# Patient Record
Sex: Male | Born: 1935 | Race: White | Hispanic: No | Marital: Married | Smoking: Former smoker
Health system: Southern US, Community
[De-identification: ages and names within clinical notes are randomized; demographics above are authoritative.]

## PROBLEM LIST (undated history)

## (undated) DIAGNOSIS — J42 Unspecified chronic bronchitis: Secondary | ICD-10-CM

## (undated) DIAGNOSIS — J189 Pneumonia, unspecified organism: Secondary | ICD-10-CM

## (undated) DIAGNOSIS — I6529 Occlusion and stenosis of unspecified carotid artery: Secondary | ICD-10-CM

## (undated) DIAGNOSIS — E119 Type 2 diabetes mellitus without complications: Secondary | ICD-10-CM

## (undated) DIAGNOSIS — K2270 Barrett's esophagus without dysplasia: Secondary | ICD-10-CM

## (undated) DIAGNOSIS — C44311 Basal cell carcinoma of skin of nose: Secondary | ICD-10-CM

## (undated) DIAGNOSIS — K5730 Diverticulosis of large intestine without perforation or abscess without bleeding: Secondary | ICD-10-CM

## (undated) DIAGNOSIS — K222 Esophageal obstruction: Secondary | ICD-10-CM

## (undated) DIAGNOSIS — K219 Gastro-esophageal reflux disease without esophagitis: Secondary | ICD-10-CM

## (undated) DIAGNOSIS — I2510 Atherosclerotic heart disease of native coronary artery without angina pectoris: Secondary | ICD-10-CM

## (undated) DIAGNOSIS — Z9289 Personal history of other medical treatment: Secondary | ICD-10-CM

## (undated) DIAGNOSIS — N289 Disorder of kidney and ureter, unspecified: Secondary | ICD-10-CM

## (undated) DIAGNOSIS — M1990 Unspecified osteoarthritis, unspecified site: Secondary | ICD-10-CM

## (undated) DIAGNOSIS — R0600 Dyspnea, unspecified: Secondary | ICD-10-CM

## (undated) DIAGNOSIS — K449 Diaphragmatic hernia without obstruction or gangrene: Secondary | ICD-10-CM

## (undated) DIAGNOSIS — I10 Essential (primary) hypertension: Secondary | ICD-10-CM

## (undated) DIAGNOSIS — Z8711 Personal history of peptic ulcer disease: Secondary | ICD-10-CM

## (undated) DIAGNOSIS — Z85828 Personal history of other malignant neoplasm of skin: Secondary | ICD-10-CM

## (undated) DIAGNOSIS — K760 Fatty (change of) liver, not elsewhere classified: Secondary | ICD-10-CM

## (undated) DIAGNOSIS — J449 Chronic obstructive pulmonary disease, unspecified: Secondary | ICD-10-CM

## (undated) DIAGNOSIS — N200 Calculus of kidney: Secondary | ICD-10-CM

## (undated) DIAGNOSIS — E785 Hyperlipidemia, unspecified: Secondary | ICD-10-CM

## (undated) DIAGNOSIS — R51 Headache: Secondary | ICD-10-CM

## (undated) DIAGNOSIS — R519 Headache, unspecified: Secondary | ICD-10-CM

## (undated) DIAGNOSIS — E7800 Pure hypercholesterolemia, unspecified: Secondary | ICD-10-CM

## (undated) DIAGNOSIS — I639 Cerebral infarction, unspecified: Secondary | ICD-10-CM

## (undated) DIAGNOSIS — G459 Transient cerebral ischemic attack, unspecified: Secondary | ICD-10-CM

## (undated) DIAGNOSIS — H409 Unspecified glaucoma: Secondary | ICD-10-CM

## (undated) DIAGNOSIS — Z87442 Personal history of urinary calculi: Secondary | ICD-10-CM

## (undated) DIAGNOSIS — R413 Other amnesia: Secondary | ICD-10-CM

## (undated) DIAGNOSIS — K5792 Diverticulitis of intestine, part unspecified, without perforation or abscess without bleeding: Secondary | ICD-10-CM

## (undated) DIAGNOSIS — Z8719 Personal history of other diseases of the digestive system: Secondary | ICD-10-CM

## (undated) HISTORY — DX: Diverticulosis of large intestine without perforation or abscess without bleeding: K57.30

## (undated) HISTORY — PX: PARS PLANA VITRECTOMY: SHX2166

## (undated) HISTORY — DX: Personal history of other malignant neoplasm of skin: Z85.828

## (undated) HISTORY — DX: Occlusion and stenosis of unspecified carotid artery: I65.29

## (undated) HISTORY — PX: MOHS SURGERY: SUR867

## (undated) HISTORY — DX: Essential (primary) hypertension: I10

## (undated) HISTORY — PX: CAROTID ENDARTERECTOMY: SUR193

## (undated) HISTORY — DX: Atherosclerotic heart disease of native coronary artery without angina pectoris: I25.10

## (undated) HISTORY — PX: SHOULDER SURGERY: SHX246

## (undated) HISTORY — DX: Fatty (change of) liver, not elsewhere classified: K76.0

## (undated) HISTORY — DX: Gastro-esophageal reflux disease without esophagitis: K21.9

## (undated) HISTORY — DX: Hyperlipidemia, unspecified: E78.5

## (undated) HISTORY — DX: Chronic obstructive pulmonary disease, unspecified: J44.9

## (undated) HISTORY — PX: BASAL CELL CARCINOMA EXCISION: SHX1214

## (undated) HISTORY — PX: KNEE SURGERY: SHX244

## (undated) HISTORY — DX: Diaphragmatic hernia without obstruction or gangrene: K44.9

## (undated) HISTORY — DX: Dyspnea, unspecified: R06.00

## (undated) HISTORY — DX: Unspecified osteoarthritis, unspecified site: M19.90

## (undated) HISTORY — PX: KNEE ARTHROSCOPY: SHX127

## (undated) HISTORY — DX: Transient cerebral ischemic attack, unspecified: G45.9

## (undated) HISTORY — PX: CAROTID ARTERY ANGIOPLASTY: SHX1300

## (undated) HISTORY — PX: EYE SURGERY: SHX253

## (undated) HISTORY — PX: CATARACT EXTRACTION W/ INTRAOCULAR LENS IMPLANT: SHX1309

## (undated) HISTORY — PX: HERNIA REPAIR: SHX51

## (undated) HISTORY — DX: Barrett's esophagus without dysplasia: K22.70

## (undated) HISTORY — DX: Other amnesia: R41.3

## (undated) HISTORY — DX: Personal history of urinary calculi: Z87.442

---

## 1997-04-25 ENCOUNTER — Inpatient Hospital Stay (HOSPITAL_COMMUNITY): Admission: EM | Admit: 1997-04-25 | Discharge: 1997-04-26 | Payer: Self-pay | Admitting: Emergency Medicine

## 1998-04-25 HISTORY — PX: SHOULDER ARTHROSCOPY: SHX128

## 1998-05-08 ENCOUNTER — Ambulatory Visit (HOSPITAL_BASED_OUTPATIENT_CLINIC_OR_DEPARTMENT_OTHER): Admission: RE | Admit: 1998-05-08 | Discharge: 1998-05-08 | Payer: Self-pay | Admitting: Orthopedic Surgery

## 1998-09-01 ENCOUNTER — Encounter (INDEPENDENT_AMBULATORY_CARE_PROVIDER_SITE_OTHER): Payer: Self-pay | Admitting: Specialist

## 1998-09-01 ENCOUNTER — Ambulatory Visit (HOSPITAL_COMMUNITY): Admission: RE | Admit: 1998-09-01 | Discharge: 1998-09-01 | Payer: Self-pay | Admitting: Gastroenterology

## 1999-10-05 ENCOUNTER — Ambulatory Visit (HOSPITAL_COMMUNITY): Admission: RE | Admit: 1999-10-05 | Discharge: 1999-10-05 | Payer: Self-pay | Admitting: Gastroenterology

## 1999-10-19 ENCOUNTER — Encounter: Payer: Self-pay | Admitting: Gastroenterology

## 1999-11-22 ENCOUNTER — Encounter: Payer: Self-pay | Admitting: Surgery

## 1999-11-22 ENCOUNTER — Ambulatory Visit (HOSPITAL_COMMUNITY): Admission: RE | Admit: 1999-11-22 | Discharge: 1999-11-22 | Payer: Self-pay | Admitting: Surgery

## 2001-05-25 ENCOUNTER — Encounter: Admission: RE | Admit: 2001-05-25 | Discharge: 2001-05-25 | Payer: Self-pay | Admitting: Family Medicine

## 2001-05-25 ENCOUNTER — Encounter: Payer: Self-pay | Admitting: Family Medicine

## 2001-10-22 ENCOUNTER — Encounter: Payer: Self-pay | Admitting: Family Medicine

## 2001-10-22 ENCOUNTER — Encounter: Admission: RE | Admit: 2001-10-22 | Discharge: 2001-10-22 | Payer: Self-pay | Admitting: Family Medicine

## 2002-05-05 ENCOUNTER — Encounter: Payer: Self-pay | Admitting: Emergency Medicine

## 2002-05-05 ENCOUNTER — Emergency Department (HOSPITAL_COMMUNITY): Admission: EM | Admit: 2002-05-05 | Discharge: 2002-05-05 | Payer: Self-pay | Admitting: Emergency Medicine

## 2002-12-02 ENCOUNTER — Encounter: Admission: RE | Admit: 2002-12-02 | Discharge: 2002-12-02 | Payer: Self-pay | Admitting: Family Medicine

## 2003-01-25 HISTORY — PX: NASAL POLYP EXCISION: SHX2068

## 2003-02-06 ENCOUNTER — Encounter: Admission: RE | Admit: 2003-02-06 | Discharge: 2003-02-06 | Payer: Self-pay | Admitting: Otolaryngology

## 2003-11-18 ENCOUNTER — Encounter: Admission: RE | Admit: 2003-11-18 | Discharge: 2003-11-18 | Payer: Self-pay | Admitting: Family Medicine

## 2003-11-18 ENCOUNTER — Encounter (INDEPENDENT_AMBULATORY_CARE_PROVIDER_SITE_OTHER): Payer: Self-pay | Admitting: *Deleted

## 2004-08-02 ENCOUNTER — Encounter: Admission: RE | Admit: 2004-08-02 | Discharge: 2004-08-02 | Payer: Self-pay | Admitting: Family Medicine

## 2004-11-06 ENCOUNTER — Encounter: Payer: Self-pay | Admitting: Gastroenterology

## 2004-11-09 ENCOUNTER — Ambulatory Visit: Payer: Self-pay | Admitting: Gastroenterology

## 2004-11-10 ENCOUNTER — Ambulatory Visit: Payer: Self-pay | Admitting: Gastroenterology

## 2004-11-26 ENCOUNTER — Ambulatory Visit: Payer: Self-pay | Admitting: Gastroenterology

## 2004-12-01 ENCOUNTER — Ambulatory Visit: Payer: Self-pay | Admitting: Gastroenterology

## 2004-12-01 ENCOUNTER — Encounter (INDEPENDENT_AMBULATORY_CARE_PROVIDER_SITE_OTHER): Payer: Self-pay | Admitting: Specialist

## 2004-12-07 ENCOUNTER — Ambulatory Visit: Payer: Self-pay | Admitting: Gastroenterology

## 2004-12-09 ENCOUNTER — Encounter: Admission: RE | Admit: 2004-12-09 | Discharge: 2004-12-09 | Payer: Self-pay | Admitting: Gastroenterology

## 2004-12-21 ENCOUNTER — Encounter (INDEPENDENT_AMBULATORY_CARE_PROVIDER_SITE_OTHER): Payer: Self-pay | Admitting: *Deleted

## 2005-01-13 ENCOUNTER — Ambulatory Visit: Payer: Self-pay | Admitting: Gastroenterology

## 2005-01-24 DIAGNOSIS — Z9289 Personal history of other medical treatment: Secondary | ICD-10-CM

## 2005-01-24 HISTORY — DX: Personal history of other medical treatment: Z92.89

## 2005-01-24 HISTORY — PX: ESOPHAGECTOMY: SUR457

## 2005-11-04 ENCOUNTER — Encounter: Admission: RE | Admit: 2005-11-04 | Discharge: 2005-11-04 | Payer: Self-pay | Admitting: Family Medicine

## 2006-01-05 ENCOUNTER — Encounter: Admission: RE | Admit: 2006-01-05 | Discharge: 2006-01-05 | Payer: Self-pay | Admitting: Family Medicine

## 2006-02-24 HISTORY — PX: VENTRAL HERNIA REPAIR: SHX424

## 2006-03-09 ENCOUNTER — Encounter: Admission: RE | Admit: 2006-03-09 | Discharge: 2006-03-09 | Payer: Self-pay | Admitting: Family Medicine

## 2006-08-15 ENCOUNTER — Encounter: Admission: RE | Admit: 2006-08-15 | Discharge: 2006-08-15 | Payer: Self-pay | Admitting: Family Medicine

## 2006-09-13 ENCOUNTER — Ambulatory Visit: Payer: Self-pay | Admitting: Vascular Surgery

## 2006-09-24 ENCOUNTER — Emergency Department (HOSPITAL_COMMUNITY): Admission: EM | Admit: 2006-09-24 | Discharge: 2006-09-24 | Payer: Self-pay | Admitting: Emergency Medicine

## 2007-01-25 HISTORY — PX: EYE SURGERY: SHX253

## 2008-04-17 ENCOUNTER — Encounter: Admission: RE | Admit: 2008-04-17 | Discharge: 2008-04-17 | Payer: Self-pay | Admitting: Internal Medicine

## 2008-05-15 ENCOUNTER — Encounter: Admission: RE | Admit: 2008-05-15 | Discharge: 2008-05-15 | Payer: Self-pay | Admitting: Family Medicine

## 2008-05-20 ENCOUNTER — Ambulatory Visit: Payer: Self-pay | Admitting: Critical Care Medicine

## 2008-05-20 DIAGNOSIS — Z85828 Personal history of other malignant neoplasm of skin: Secondary | ICD-10-CM

## 2008-05-20 DIAGNOSIS — J449 Chronic obstructive pulmonary disease, unspecified: Secondary | ICD-10-CM | POA: Insufficient documentation

## 2008-05-20 DIAGNOSIS — E119 Type 2 diabetes mellitus without complications: Secondary | ICD-10-CM

## 2008-05-20 DIAGNOSIS — M129 Arthropathy, unspecified: Secondary | ICD-10-CM | POA: Insufficient documentation

## 2008-05-20 DIAGNOSIS — I10 Essential (primary) hypertension: Secondary | ICD-10-CM | POA: Insufficient documentation

## 2008-05-20 DIAGNOSIS — H409 Unspecified glaucoma: Secondary | ICD-10-CM

## 2008-05-20 DIAGNOSIS — E785 Hyperlipidemia, unspecified: Secondary | ICD-10-CM

## 2008-05-20 DIAGNOSIS — K2270 Barrett's esophagus without dysplasia: Secondary | ICD-10-CM

## 2008-05-20 DIAGNOSIS — Z8679 Personal history of other diseases of the circulatory system: Secondary | ICD-10-CM | POA: Insufficient documentation

## 2008-05-22 ENCOUNTER — Encounter: Payer: Self-pay | Admitting: Critical Care Medicine

## 2008-06-10 ENCOUNTER — Ambulatory Visit: Payer: Self-pay | Admitting: Critical Care Medicine

## 2008-06-20 ENCOUNTER — Encounter: Payer: Self-pay | Admitting: Critical Care Medicine

## 2008-07-12 ENCOUNTER — Emergency Department (HOSPITAL_COMMUNITY): Admission: EM | Admit: 2008-07-12 | Discharge: 2008-07-12 | Payer: Self-pay | Admitting: Emergency Medicine

## 2008-07-21 ENCOUNTER — Ambulatory Visit: Payer: Self-pay | Admitting: Critical Care Medicine

## 2008-08-27 DIAGNOSIS — K2990 Gastroduodenitis, unspecified, without bleeding: Secondary | ICD-10-CM

## 2008-08-27 DIAGNOSIS — K7689 Other specified diseases of liver: Secondary | ICD-10-CM | POA: Insufficient documentation

## 2008-08-27 DIAGNOSIS — K449 Diaphragmatic hernia without obstruction or gangrene: Secondary | ICD-10-CM | POA: Insufficient documentation

## 2008-08-27 DIAGNOSIS — K5730 Diverticulosis of large intestine without perforation or abscess without bleeding: Secondary | ICD-10-CM | POA: Insufficient documentation

## 2008-08-27 DIAGNOSIS — I6529 Occlusion and stenosis of unspecified carotid artery: Secondary | ICD-10-CM

## 2008-08-27 DIAGNOSIS — K2970 Gastritis, unspecified, without bleeding: Secondary | ICD-10-CM | POA: Insufficient documentation

## 2008-08-27 HISTORY — DX: Occlusion and stenosis of unspecified carotid artery: I65.29

## 2008-08-28 ENCOUNTER — Ambulatory Visit: Payer: Self-pay | Admitting: Gastroenterology

## 2008-08-28 DIAGNOSIS — Z87442 Personal history of urinary calculi: Secondary | ICD-10-CM

## 2008-08-28 DIAGNOSIS — K439 Ventral hernia without obstruction or gangrene: Secondary | ICD-10-CM | POA: Insufficient documentation

## 2008-08-28 DIAGNOSIS — D126 Benign neoplasm of colon, unspecified: Secondary | ICD-10-CM

## 2008-08-28 HISTORY — DX: Personal history of urinary calculi: Z87.442

## 2008-09-05 ENCOUNTER — Ambulatory Visit: Payer: Self-pay | Admitting: Gastroenterology

## 2008-09-12 ENCOUNTER — Ambulatory Visit: Payer: Self-pay | Admitting: Critical Care Medicine

## 2008-11-24 HISTORY — PX: GLAUCOMA SURGERY: SHX656

## 2008-12-10 ENCOUNTER — Ambulatory Visit: Payer: Self-pay | Admitting: Critical Care Medicine

## 2009-01-09 ENCOUNTER — Ambulatory Visit: Payer: Self-pay | Admitting: Internal Medicine

## 2009-01-09 ENCOUNTER — Inpatient Hospital Stay (HOSPITAL_COMMUNITY): Admission: EM | Admit: 2009-01-09 | Discharge: 2009-01-12 | Payer: Self-pay | Admitting: Emergency Medicine

## 2009-01-15 ENCOUNTER — Encounter: Payer: Self-pay | Admitting: Adult Health

## 2009-01-15 ENCOUNTER — Ambulatory Visit: Payer: Self-pay | Admitting: Critical Care Medicine

## 2009-01-15 DIAGNOSIS — T782XXA Anaphylactic shock, unspecified, initial encounter: Secondary | ICD-10-CM | POA: Insufficient documentation

## 2009-02-20 ENCOUNTER — Ambulatory Visit: Payer: Self-pay | Admitting: Critical Care Medicine

## 2009-05-01 ENCOUNTER — Encounter: Admission: RE | Admit: 2009-05-01 | Discharge: 2009-05-01 | Payer: Self-pay | Admitting: Internal Medicine

## 2009-05-29 ENCOUNTER — Ambulatory Visit: Payer: Self-pay | Admitting: Critical Care Medicine

## 2009-05-29 ENCOUNTER — Telehealth: Payer: Self-pay | Admitting: Critical Care Medicine

## 2009-06-01 ENCOUNTER — Inpatient Hospital Stay (HOSPITAL_COMMUNITY): Admission: EM | Admit: 2009-06-01 | Discharge: 2009-06-02 | Payer: Self-pay | Admitting: Emergency Medicine

## 2009-07-30 ENCOUNTER — Ambulatory Visit: Payer: Self-pay | Admitting: Critical Care Medicine

## 2009-08-19 ENCOUNTER — Ambulatory Visit: Payer: Self-pay | Admitting: Critical Care Medicine

## 2009-08-19 ENCOUNTER — Telehealth (INDEPENDENT_AMBULATORY_CARE_PROVIDER_SITE_OTHER): Payer: Self-pay | Admitting: *Deleted

## 2009-08-25 ENCOUNTER — Telehealth (INDEPENDENT_AMBULATORY_CARE_PROVIDER_SITE_OTHER): Payer: Self-pay | Admitting: *Deleted

## 2009-09-16 ENCOUNTER — Ambulatory Visit: Payer: Self-pay | Admitting: Critical Care Medicine

## 2009-10-28 ENCOUNTER — Ambulatory Visit: Payer: Self-pay | Admitting: Critical Care Medicine

## 2009-11-11 ENCOUNTER — Telehealth (INDEPENDENT_AMBULATORY_CARE_PROVIDER_SITE_OTHER): Payer: Self-pay | Admitting: *Deleted

## 2009-11-12 ENCOUNTER — Telehealth (INDEPENDENT_AMBULATORY_CARE_PROVIDER_SITE_OTHER): Payer: Self-pay | Admitting: *Deleted

## 2009-11-20 ENCOUNTER — Encounter: Payer: Self-pay | Admitting: Critical Care Medicine

## 2009-11-20 ENCOUNTER — Telehealth (INDEPENDENT_AMBULATORY_CARE_PROVIDER_SITE_OTHER): Payer: Self-pay | Admitting: *Deleted

## 2009-11-25 ENCOUNTER — Ambulatory Visit: Payer: Self-pay | Admitting: Critical Care Medicine

## 2009-11-25 ENCOUNTER — Telehealth (INDEPENDENT_AMBULATORY_CARE_PROVIDER_SITE_OTHER): Payer: Self-pay | Admitting: *Deleted

## 2009-11-26 ENCOUNTER — Telehealth (INDEPENDENT_AMBULATORY_CARE_PROVIDER_SITE_OTHER): Payer: Self-pay | Admitting: *Deleted

## 2010-01-27 ENCOUNTER — Ambulatory Visit
Admission: RE | Admit: 2010-01-27 | Discharge: 2010-01-27 | Payer: Self-pay | Source: Home / Self Care | Attending: Critical Care Medicine | Admitting: Critical Care Medicine

## 2010-02-23 NOTE — Assessment & Plan Note (Signed)
Summary: Pulmonary OV   Copy to:  n/a Primary Provider/Referring Provider:  Henri Medal  CC:  4 wk follow up.  states breathing is doing well overall.  No complaints. .  History of Present Illness:   77 year old man with COPD primary emphysematous component..   Prior esophagectomy 2007 and abn CXR since that surgery.    July 21, 2008-- last OV we rec:, Increase foradil to one puff/capsule twice daily. Increase Qvar to two puff twice daily, No change on spiriva     September 12, 2008-- at last ov we rec:Increase Qvar to 3 puffs twice daily Start Nasonex two puff each nostril daily Prednisone 10mg  4 each am x3days, 3 x 3days, 2 x 3days, 1 x 3days then stop Now the cough is productive of clear phlegm.  December 10, 2008--Pt is now off the qvar.The pt stopped this on his own x one month There has been no increase in cough.The dyspnea is better The pt is only on spiriva and foradil and is not using the proair  January 15, 2009 --Presents for post hospital visit States breathing is doing well but still having prod cough with clear mucus. Admitted 12/17-12/20/10 for Acute Resp Failure secondary to anaphylactic reaction to Avelox. Xray showed left effusion, and atx. Says within 1 hour he has severe shortness of breath, collapsed at home, EMS transferred to hospital required intubation and vent support. He quickly improved and was extubated . Discharged on zithromax. Tx w/ IV steroids. Also stopped on ARB in hosptial (due to potential for severe reactions) Denies chest pain, dyspnea, orthopnea, hemoptysis, fever, n/v/d, edema, headache.   February 20, 2009 10:13 AM No cough,  Shortness of breath is stable.  No real chest pain.  Sl wheeze.  No edema in feet. Pt denies any significant sore throat, nasal congestion or excess secretions, fever, chills, sweats, unintended weight loss, pleurtic or exertional chest pain, orthopnea PND, or leg swelling Pt denies any increase in rescue therapy over  baseline, denies waking up needing it or having any early am or nocturnal exacerbations of coughing/wheezing/or dyspnea.   Preventive Screening-Counseling & Management  Alcohol-Tobacco     Smoking Status: quit > 6 months  Current Medications (verified): 1)  Spiriva Handihaler 18 Mcg Caps (Tiotropium Bromide Monohydrate) .... Inhale Contents of 1 Capsule Once A Day 2)  Foradil Aerolizer 12 Mcg Caps (Formoterol Fumarate) .... Inhale 1 Puff Twice Daily 3)  Proair Hfa 108 (90 Base) Mcg/act  Aers (Albuterol Sulfate) .Marland Kitchen.. 1-2 Puffs Every 4-6 Hours As Needed 4)  Simvastatin 80 Mg Tabs (Simvastatin) .... Take 1/2 Tab By Mouth At Bedtime 5)  Omeprazole 20 Mg Tbec (Omeprazole) .... Take 2 Tabs By Mouth Daily 6)  Metformin Hcl 500 Mg Tabs (Metformin Hcl) .... Take 2 Tabs By Mouth Each Morning and 2 Tab By Mouth Each Evening 7)  Valtrex 500 Mg Tabs (Valacyclovir Hcl) .... Take 2 Tabs By Mouth Every 8 Hours As Needed 8)  Aspirin Low Dose 81 Mg Tabs (Aspirin) .... Take 1 Tablet By Mouth Once A Day 9)  Diazepam 5 Mg Tabs (Diazepam) .... 1/2 By Mouth Once Daily As Needed 10)  Promethazine Hcl 25 Mg Tabs (Promethazine Hcl) .... 1/2 To 1 By Mouth As Needed 11)  Clonidine Hcl 0.2 Mg Tabs (Clonidine Hcl) .Marland Kitchen.. 1 Tab By Mouth As Needed When Bp Is Over 140 12)  Prednisone 10 Mg Tabs (Prednisone) .... Taper As Directed Per Hospital Discharge Sheet 13)  Qvar 80 Mcg/act Aers (Beclomethasone  Dipropionate) .... 2 Puffs Once Daily 14)  Losartan Potassium-Hctz 100-12.5 Mg Tabs (Losartan Potassium-Hctz) .... Once Daily 15)  Combigan 0.2-0.5 % Soln (Brimonidine Tartrate-Timolol) .... To Left Eye Two Times A Day  Allergies (verified): 1)  ! * Avelox 2)  ! * "quinolone Family" No Avelox, Levaquin, Cipro , Factive 3)  Penicillin 4)  Sulfa  Past Pulmonary History:  Pulmonary History: Pulmonary Function Test  Date: 06/10/2008 Height (in.): 70 Gender: Male  Pre-Spirometry  FVC     Value: 3.12 L/min   Pred: 4.37  L/min     % Pred: 71 % FEV1     Value: 1.78 L     Pred: 2.84 L     % Pred: 61 % FEV1/FVC   Value: 55 %     Pred: 67 %     FEF 25-75   Value: 0.51 L/min   Pred: 22.43 L/min     % Pred: 21 %  Post-Spirometry  FVC     Value: 3.06 L/min   Pred: 4.37 L/min     % Pred: 70 % FEV1     Value: 1.73 L     Pred: 2.84 L     % Pred: 61 % FEV1/FVC   Value: 56 %     Pred: 67 %     FEF 25-75   Value: 0.49 L/min   Pred: 2.43 L/min     % Pred: 20 %  Lung Volumes  TLC     Value: 4.73 L   % Pred: 71 % RV     Value: 1.61 L   % Pred: 59 % DLCO     Value: 17.9 %   % Pred: 87 % DLCO/VA   Value: 3.68 %   % Pred: 115 %  Comments:  Severe obstructive defect,  normal DLCO,  mild restriction  6 min walk test No desaturation Interpretation:  Number of laps  8 X 48 meters =   384 meters+ final partial lap: 40 meters =    424 meters   Total distance walked in six minutes: 424 meters  Tech ID: Tivis Ringer (Jun 10, 2008 12:01 PM) Tech Comments pt completed test w/ 0 rest  breaks and 0 complaints  Review of Systems       The patient complains of shortness of breath with activity.  The patient denies shortness of breath at rest, productive cough, non-productive cough, coughing up blood, chest pain, irregular heartbeats, acid heartburn, indigestion, loss of appetite, weight change, abdominal pain, difficulty swallowing, sore throat, tooth/dental problems, headaches, nasal congestion/difficulty breathing through nose, sneezing, itching, ear ache, anxiety, depression, hand/feet swelling, joint stiffness or pain, rash, change in color of mucus, and fever.    Vital Signs:  Patient profile:   77 year old male Height:      71 inches Weight:      186 pounds BMI:     26.04 O2 Sat:      96 % on Room air Temp:     97.7 degrees F oral Pulse rate:   101 / minute BP sitting:   130 / 68  (left arm) Cuff size:   regular  Vitals Entered By: Gweneth Dimitri RN (February 20, 2009 10:04 AM)  O2 Flow:  Room air CC: 4 wk follow  up.  states breathing is doing well overall.  No complaints.  Comments Medications reviewed with patient Daytime contact number verified with patient. Gweneth Dimitri RN  February 20, 2009 10:04 AM  Physical Exam  Additional Exam:  Gen: Pleasant, well-nourished, in no distress,  normal affect ENT: No lesions,  mouth clear,  oropharynx clear, no postnasal drip Neck: No JVD, no TMG, no carotid bruits Lungs: No use of accessory muscles, no dullness to percussion, distant BS Cardiovascular: RRR, heart sounds normal, no murmur or gallops, no peripheral edema Abdomen: soft and NT, no HSM,  BS normal Musculoskeletal: No deformities, no cyanosis or clubbing Neuro: alert, non focal Skin: Warm, no lesions or rashes    Impression & Recommendations:  Problem # 1:  C O P D (ICD-496) Assessment Improved COPD stable plan No change in inhaled medications.   Maintain treatment program as currently prescribed.  Medications Added to Medication List This Visit: 1)  Omeprazole 20 Mg Tbec (Omeprazole) .... Take 1  tab  by mouth daily 2)  Diazepam 5 Mg Tabs (Diazepam) .... 1/2 by mouth once daily as needed 3)  Qvar 80 Mcg/act Aers (Beclomethasone dipropionate) .... 2 puffs once daily 4)  Losartan Potassium-hctz 100-12.5 Mg Tabs (Losartan potassium-hctz) .... Once daily 5)  Combigan 0.2-0.5 % Soln (Brimonidine tartrate-timolol) .... To left eye two times a day  Complete Medication List: 1)  Spiriva Handihaler 18 Mcg Caps (Tiotropium bromide monohydrate) .... Inhale contents of 1 capsule once a day 2)  Foradil Aerolizer 12 Mcg Caps (Formoterol fumarate) .... Inhale 1 puff twice daily 3)  Proair Hfa 108 (90 Base) Mcg/act Aers (Albuterol sulfate) .Marland Kitchen.. 1-2 puffs every 4-6 hours as needed 4)  Simvastatin 80 Mg Tabs (Simvastatin) .... Take 1/2 tab by mouth at bedtime 5)  Omeprazole 20 Mg Tbec (Omeprazole) .... Take 1  tab  by mouth daily 6)  Metformin Hcl 500 Mg Tabs (Metformin hcl) .... Take 2 tabs by  mouth each morning and 2 tab by mouth each evening 7)  Aspirin Low Dose 81 Mg Tabs (Aspirin) .... Take 1 tablet by mouth once a day 8)  Diazepam 5 Mg Tabs (Diazepam) .... 1/2 by mouth once daily as needed 9)  Promethazine Hcl 25 Mg Tabs (Promethazine hcl) .... 1/2 to 1 by mouth as needed 10)  Clonidine Hcl 0.2 Mg Tabs (Clonidine hcl) .Marland Kitchen.. 1 tab by mouth as needed when bp is over 140 11)  Qvar 80 Mcg/act Aers (Beclomethasone dipropionate) .... 2 puffs once daily 12)  Losartan Potassium-hctz 100-12.5 Mg Tabs (Losartan potassium-hctz) .... Once daily 13)  Combigan 0.2-0.5 % Soln (Brimonidine tartrate-timolol) .... To left eye two times a day  Other Orders: Est. Patient Level III (16109)  Patient Instructions: 1)  No change in medicaitons 2)  Return 4 months    Appended Document: Pulmonary OV fax ron roberts

## 2010-02-23 NOTE — Assessment & Plan Note (Signed)
Summary: Pulmonary OV   Copy to:  n/a Primary Provider/Referring Provider:  Henri Medal  CC:  Acute Visit.  States after finishing prednisone and abx, SOB, chest congestion, and cough returned.  Cough is prod with a small amount of clear mucus.  .  History of Present Illness:   77 year old man with COPD primary emphysematous component..   Prior esophagectomy 2007 and abn CXR since that surgery.    February 20, 2009 10:13 AM No cough,  Shortness of breath is stable.  No real chest pain.  Sl wheeze.  No edema in feet. Pt denies any significant sore throat, nasal congestion or excess secretions, fever, chills, sweats, unintended weight loss, pleurtic or exertional chest pain, orthopnea PND, or leg swelling Pt denies any increase in rescue therapy over baseline, denies waking up needing it or having any early am or nocturnal exacerbations of coughing/wheezing/or dyspnea.  May 29, 2009 1:41 PM More dyspnea and wheezing is noted and gotten worse over the past few months.  More cough and productive clear white mucous.  No real chest pain.  Notes more wheezing.  No real edema.    July 30, 2009--Presents for an acute office visit. Complains of prod cough with green mucus, wheezing, increased SOB x2weeks - He is  currently on 3rd day of 3tabs of 10mg  pred taper. Had left over steroid taper that he did not take in past. He is still coughing w/ thick green mucus. Wheezing is some better. Denies chest pain,  orthopnea, hemoptysis, fever, n/v/d, edema, headache.   August 19, 2009 2:05 PM Pt seen for exac and rx doxy and pred per NP on 07/30/09. The pt finished all meds and now is no better.  The  mucus now,however,  is clearer.  The pt is still dyspneic and worse with heat.  No real chest pain.  Notes some wheeze.  No hemoptysis, n/v, f/c/s.  Preventive Screening-Counseling & Management  Alcohol-Tobacco     Smoking Status: quit > 6 months     Year Quit: 1990     Pack years: 40 yrs x1 ppd  Current  Medications (verified): 1)  Spiriva Handihaler 18 Mcg Caps (Tiotropium Bromide Monohydrate) .... Inhale Contents of 1 Capsule Once A Day 2)  Proair Hfa 108 (90 Base) Mcg/act  Aers (Albuterol Sulfate) .Marland Kitchen.. 1-2 Puffs Every 4-6 Hours As Needed 3)  Simvastatin 80 Mg Tabs (Simvastatin) .... Take 1/2 Tab By Mouth At Bedtime 4)  Omeprazole 20 Mg Tbec (Omeprazole) .... Take 1  Tab  By Mouth Daily 5)  Metformin Hcl 500 Mg Tabs (Metformin Hcl) .... Take 2 Tabs By Mouth Each Morning and 2 Tab By Mouth Each Evening 6)  Aspirin Low Dose 81 Mg Tabs (Aspirin) .... Take 1 Tablet By Mouth Once A Day 7)  Diazepam 5 Mg Tabs (Diazepam) .... 1/2 By Mouth Once Daily As Needed 8)  Promethazine Hcl 25 Mg Tabs (Promethazine Hcl) .... 1/2 To 1 By Mouth As Needed 9)  Clonidine Hcl 0.2 Mg Tabs (Clonidine Hcl) .Marland Kitchen.. 1 Tab By Mouth As Needed When Bp Is Over 140 10)  Qvar 80 Mcg/act Aers (Beclomethasone Dipropionate) .... 2 Puffs Twice Daily 11)  Losartan Potassium-Hctz 100-12.5 Mg Tabs (Losartan Potassium-Hctz) .... Once Daily 12)  Alphagan P 0.1 % Soln (Brimonidine Tartrate) .Marland Kitchen.. 1 Gtt Left Eye Two Times A Day 13)  Valacyclovir Hcl 500 Mg Tabs (Valacyclovir Hcl) .... As Needed 14)  Foradil Aerolizer 12 Mcg Caps (Formoterol Fumarate) .Marland Kitchen.. 1 Puff  Two Times A Day 15)  Amlodipine Besylate 5 Mg Tabs (Amlodipine Besylate) .... Take 1 Tablet By Mouth Once A Day 16)  Vitamin D 1000 Unit Tabs (Cholecalciferol) .... Take 1 Tablet By Mouth Once A Day 17)  Multivitamins   Tabs (Multiple Vitamin) .... Take 1 Tablet By Mouth Once A Day 18)  Saw Palmetto 500 Mg Caps (Saw Palmetto (Serenoa Repens)) .... Take 1 Capsule By Mouth Once A Day 19)  Zinc 100 Mg Tabs (Zinc) .... Take 1 Tablet By Mouth Once A Day 20)  Vitamin E 400 Unit Caps (Vitamin E) .... Take 1 Capsule By Mouth Once A Day 21)  Glimepiride 2 Mg Tabs (Glimepiride) .... Take 1 Tablet By Mouth Every Morning  Allergies (verified): 1)  ! * Avelox 2)  ! * "quinolone Family" No  Avelox, Levaquin, Cipro , Factive 3)  Penicillin 4)  Sulfa  Past History:  Past medical, surgical, family and social histories (including risk factors) reviewed, and no changes noted (except as noted below).  Past Medical History: Reviewed history from 08/27/2008 and no changes required. Current Problems:  FATTY LIVER DISEASE (ICD-571.8) CAROTID ARTERY STENOSIS, BILATERAL (ICD-433.10) DIVERTICULOSIS, COLON (ICD-562.10) GASTRITIS (ICD-535.50) HIATAL HERNIA WITH REFLUX (ICD-553.3) DYSPNEA (ICD-786.05) C O P D (ICD-496) BASAL CELL CARCINOMA, HX OF (ICD-V10.83) BARRETTS ESOPHAGUS (ICD-530.85) ARTHRITIS (ICD-716.90) TRANSIENT ISCHEMIC ATTACKS, HX OF (ICD-V12.50) DM (ICD-250.00) HYPERLIPIDEMIA (ICD-272.4) ACID REFLUX DISEASE (ICD-530.81) GLAUCOMA (ICD-365.9) HYPERTENSION (ICD-401.9)  Past Surgical History: Reviewed history from 08/28/2008 and no changes required. Esophagectomy with stomach pull through 2007    -barretts esophagitis  pre cancer Eye surgeries ventral hernia repair 2008    -due to prior abdominal incision ortho knee both kness carotid arteries  Past Pulmonary History:  Pulmonary History: Pulmonary Function Test  Date: 06/10/2008 Height (in.): 70 Gender: Male  Pre-Spirometry  FVC     Value: 3.12 L/min   Pred: 4.37 L/min     % Pred: 71 % FEV1     Value: 1.78 L     Pred: 2.84 L     % Pred: 61 % FEV1/FVC   Value: 55 %     Pred: 67 %     FEF 25-75   Value: 0.51 L/min   Pred: 22.43 L/min     % Pred: 21 %  Post-Spirometry  FVC     Value: 3.06 L/min   Pred: 4.37 L/min     % Pred: 70 % FEV1     Value: 1.73 L     Pred: 2.84 L     % Pred: 61 % FEV1/FVC   Value: 56 %     Pred: 67 %     FEF 25-75   Value: 0.49 L/min   Pred: 2.43 L/min     % Pred: 20 %  Lung Volumes  TLC     Value: 4.73 L   % Pred: 71 % RV     Value: 1.61 L   % Pred: 59 % DLCO     Value: 17.9 %   % Pred: 87 % DLCO/VA   Value: 3.68 %   % Pred: 115 %  Comments:  Severe obstructive defect,   normal DLCO,  mild restriction  6 min walk test No desaturation Interpretation:  Number of laps  8 X 48 meters =   384 meters+ final partial lap: 40 meters =    424 meters   Total distance walked in six minutes: 424 meters  Tech ID: Tivis Ringer (Jun 10, 2008 12:01  PM) Tech Comments pt completed test w/ 0 rest  breaks and 0 complaints  Family History: Reviewed history from 05/20/2008 and no changes required. Family History Colon Cancer mother, MGF Family History Lung Cancer  mother  Family History MI/Heart Attack mother , 2 brothers Esophageal/stomach cancer :  father   Social History: Reviewed history from 08/28/2008 and no changes required. Patient states former smoker.  retired Armed forces operational officer Occupation: retired Alcohol Use - no Illicit Drug Use - no  Review of Systems       The patient complains of shortness of breath with activity, shortness of breath at rest, and productive cough.  The patient denies non-productive cough, coughing up blood, chest pain, irregular heartbeats, acid heartburn, indigestion, loss of appetite, weight change, abdominal pain, difficulty swallowing, sore throat, tooth/dental problems, headaches, nasal congestion/difficulty breathing through nose, sneezing, itching, ear ache, anxiety, depression, hand/feet swelling, joint stiffness or pain, rash, change in color of mucus, and fever.    Vital Signs:  Patient profile:   77 year old male Height:      69 inches Weight:      177.38 pounds BMI:     26.29 O2 Sat:      97 % on Room air Temp:     98.3 degrees F oral Pulse rate:   84 / minute BP sitting:   136 / 80  (left arm) Cuff size:   regular  Vitals Entered By: Gweneth Dimitri RN (August 19, 2009 1:39 PM)  O2 Flow:  Room air CC: Acute Visit.  States after finishing prednisone and abx, SOB, chest congestion, cough returned.  Cough is prod with a small amount of clear mucus.   Comments Medications reviewed with patient Daytime contact number  verified with patient. Gweneth Dimitri RN  August 19, 2009 1:39 PM    Physical Exam  Additional Exam:  Gen: Pleasant, well-nourished, in no distress,  normal affect ENT: No lesions,  mouth clear,  oropharynx clear, no postnasal drip Neck: No JVD, no TMG, no carotid bruits Lungs: No use of accessory muscles, no dullness to percussion, distant BS, exp wheeze Cardiovascular: RRR, heart sounds normal, no murmur or gallops, no peripheral edema Abdomen: soft and NT, no HSM,  BS normal Musculoskeletal: No deformities, no cyanosis or clubbing Neuro: alert, non focal Skin: Warm, no lesions or rashes    Impression & Recommendations:  Problem # 1:  C O P D (ICD-496) Assessment Deteriorated copd with AB flare and ongoing airway inflammation plan trial Daliresp daily, samples given repulse prednisone no further ABX  Medications Added to Medication List This Visit: 1)  Daliresp  .... 500 micrograms tablet one by mouth daily 2)  Prednisone 10 Mg Tabs (Prednisone) .... Take as directed 4 each am x3days, 3 x 3days, 2 x 3days, 1 x 3days then stop  Complete Medication List: 1)  Spiriva Handihaler 18 Mcg Caps (Tiotropium bromide monohydrate) .... Inhale contents of 1 capsule once a day 2)  Proair Hfa 108 (90 Base) Mcg/act Aers (Albuterol sulfate) .Marland Kitchen.. 1-2 puffs every 4-6 hours as needed 3)  Simvastatin 80 Mg Tabs (Simvastatin) .... Take 1/2 tab by mouth at bedtime 4)  Omeprazole 20 Mg Tbec (Omeprazole) .... Take 1  tab  by mouth daily 5)  Metformin Hcl 500 Mg Tabs (Metformin hcl) .... Take 2 tabs by mouth each morning and 2 tab by mouth each evening 6)  Aspirin Low Dose 81 Mg Tabs (Aspirin) .... Take 1 tablet by mouth once a day 7)  Diazepam 5 Mg Tabs (Diazepam) .... 1/2 by mouth once daily as needed 8)  Promethazine Hcl 25 Mg Tabs (Promethazine hcl) .... 1/2 to 1 by mouth as needed 9)  Clonidine Hcl 0.2 Mg Tabs (Clonidine hcl) .Marland Kitchen.. 1 tab by mouth as needed when bp is over 140 10)  Qvar 80  Mcg/act Aers (Beclomethasone dipropionate) .... 2 puffs twice daily 11)  Losartan Potassium-hctz 100-12.5 Mg Tabs (Losartan potassium-hctz) .... Once daily 12)  Alphagan P 0.1 % Soln (Brimonidine tartrate) .Marland Kitchen.. 1 gtt left eye two times a day 13)  Valacyclovir Hcl 500 Mg Tabs (Valacyclovir hcl) .... As needed 14)  Foradil Aerolizer 12 Mcg Caps (Formoterol fumarate) .Marland Kitchen.. 1 puff two times a day 15)  Amlodipine Besylate 5 Mg Tabs (Amlodipine besylate) .... Take 1 tablet by mouth once a day 16)  Vitamin D 1000 Unit Tabs (Cholecalciferol) .... Take 1 tablet by mouth once a day 17)  Multivitamins Tabs (Multiple vitamin) .... Take 1 tablet by mouth once a day 18)  Saw Palmetto 500 Mg Caps (Saw palmetto (serenoa repens)) .... Take 1 capsule by mouth once a day 19)  Zinc 100 Mg Tabs (Zinc) .... Take 1 tablet by mouth once a day 20)  Vitamin E 400 Unit Caps (Vitamin e) .... Take 1 capsule by mouth once a day 21)  Glimepiride 2 Mg Tabs (Glimepiride) .... Take 1 tablet by mouth every morning 22)  Daliresp  .... 500 micrograms tablet one by mouth daily 23)  Prednisone 10 Mg Tabs (Prednisone) .... Take as directed 4 each am x3days, 3 x 3days, 2 x 3days, 1 x 3days then stop 24)  Biaxin 500 Mg Tabs (Clarithromycin) .... Take 1 tablet by mouth two times a day  Other Orders: Est. Patient Level IV (16109)  Patient Instructions: 1)  Start Daliresp one daily (use samples first) 2)  Prednisone 10mg  4 each am x3days, 3 x 3days, 2 x 3days, 1 x 3days then stop 3)  No other medication changes 4)  Return one month Prescriptions: PREDNISONE 10 MG  TABS (PREDNISONE) Take as directed 4 each am x3days, 3 x 3days, 2 x 3days, 1 x 3days then stop  #30 x 0   Entered and Authorized by:   Storm Frisk MD   Signed by:   Storm Frisk MD on 08/19/2009   Method used:   Electronically to        [facility] [address] [phone]* (retail)       [address]       [place], [place]  [address]        Ph: [phone] or [phone]       Fax: [phone]   RxID:   [id number] DALIRESP 500 micrograms tablet One by mouth daily  #30 x 6   Entered and Authorized by:   Storm Frisk MD   Signed by:   Storm Frisk MD on 08/19/2009   Method used:   Print then Give to Patient   RxID:   [id number]   Appended Document: Pulmonary OV fax Zenaida Deed

## 2010-02-23 NOTE — Assessment & Plan Note (Signed)
Summary: Pulmonary OV   Copy to:  n/a Primary Provider/Referring Provider:  Henri Medal  CC:  4 month follow up.  Pt states he is not having any problems with breathing but does state he has wheezing "all the time."  States he does have a prod cough with clear phelgm and chest congestion.  Would like to discuss getting a rx for prednsione.Marland Kitchen  History of Present Illness:   77 year old man with COPD primary emphysematous component..   Prior esophagectomy 2007 and abn CXR since that surgery.    February 20, 2009 10:13 AM No cough,  Shortness of breath is stable.  No real chest pain.  Sl wheeze.  No edema in feet. Pt denies any significant sore throat, nasal congestion or excess secretions, fever, chills, sweats, unintended weight loss, pleurtic or exertional chest pain, orthopnea PND, or leg swelling Pt denies any increase in rescue therapy over baseline, denies waking up needing it or having any early am or nocturnal exacerbations of coughing/wheezing/or dyspnea.  May 29, 2009 1:41 PM More dyspnea and wheezing is noted and gotten worse over the past few months.  More cough and productive clear white mucous.  No real chest pain.  Notes more wheezing.  No real edema.    Clinical Reports Reviewed:  PFT's:  06/20/2008: DLCO %Predicted:  87 FEF 25/75 %Predicted:  21 FEV1 %Predicted:  61 FVC %Predicted:  71 Post Spirometry FEF 25/75 %Predicted:  20 Post Spirometry FEV1 %Predicted:  61 Post Spirometry FVC %Predicted:  70 RV %Predicted:  59 TLC %Predicted:  71  05/22/2008: FEF 25/75 %Predicted:  16 FEV1 %Predicted:  40 FEV1/FVC %Predicted:  73 FVC %Predicted:  55  05/20/2008: FEF 25/75 %Predicted:  16 FEV1 %Predicted:  40 FVC %Predicted:  55   Preventive Screening-Counseling & Management  Alcohol-Tobacco     Smoking Status: quit > 6 months  Current Medications (verified): 1)  Spiriva Handihaler 18 Mcg Caps (Tiotropium Bromide Monohydrate) .... Inhale Contents of 1 Capsule  Once A Day 2)  Proair Hfa 108 (90 Base) Mcg/act  Aers (Albuterol Sulfate) .Marland Kitchen.. 1-2 Puffs Every 4-6 Hours As Needed 3)  Simvastatin 80 Mg Tabs (Simvastatin) .... Take 1/2 Tab By Mouth At Bedtime 4)  Omeprazole 20 Mg Tbec (Omeprazole) .... Take 1  Tab  By Mouth Daily 5)  Metformin Hcl 500 Mg Tabs (Metformin Hcl) .... Take 2 Tabs By Mouth Each Morning and 2 Tab By Mouth Each Evening 6)  Aspirin Low Dose 81 Mg Tabs (Aspirin) .... Take 1 Tablet By Mouth Once A Day 7)  Diazepam 5 Mg Tabs (Diazepam) .... 1/2 By Mouth Once Daily As Needed 8)  Promethazine Hcl 25 Mg Tabs (Promethazine Hcl) .... 1/2 To 1 By Mouth As Needed 9)  Clonidine Hcl 0.2 Mg Tabs (Clonidine Hcl) .Marland Kitchen.. 1 Tab By Mouth As Needed When Bp Is Over 140 10)  Qvar 80 Mcg/act Aers (Beclomethasone Dipropionate) .... 2 Puffs Once Daily 11)  Losartan Potassium-Hctz 100-12.5 Mg Tabs (Losartan Potassium-Hctz) .... Once Daily 12)  Alphagan P 0.1 % Soln (Brimonidine Tartrate) .Marland Kitchen.. 1 Gtt Left Eye Two Times A Day 13)  Valacyclovir Hcl 500 Mg Tabs (Valacyclovir Hcl) .... As Needed 14)  Serevent Diskus 50 Mcg/dose Aepb (Salmeterol Xinafoate) .... Two Times A Day  Allergies (verified): 1)  ! * Avelox 2)  ! * "quinolone Family" No Avelox, Levaquin, Cipro , Factive 3)  Penicillin 4)  Sulfa  Past History:  Past medical, surgical, family and social histories (including risk  factors) reviewed, and no changes noted (except as noted below).  Past Medical History: Reviewed history from 08/27/2008 and no changes required. Current Problems:  FATTY LIVER DISEASE (ICD-571.8) CAROTID ARTERY STENOSIS, BILATERAL (ICD-433.10) DIVERTICULOSIS, COLON (ICD-562.10) GASTRITIS (ICD-535.50) HIATAL HERNIA WITH REFLUX (ICD-553.3) DYSPNEA (ICD-786.05) C O P D (ICD-496) BASAL CELL CARCINOMA, HX OF (ICD-V10.83) BARRETTS ESOPHAGUS (ICD-530.85) ARTHRITIS (ICD-716.90) TRANSIENT ISCHEMIC ATTACKS, HX OF (ICD-V12.50) DM (ICD-250.00) HYPERLIPIDEMIA (ICD-272.4) ACID  REFLUX DISEASE (ICD-530.81) GLAUCOMA (ICD-365.9) HYPERTENSION (ICD-401.9)  Past Surgical History: Reviewed history from 08/28/2008 and no changes required. Esophagectomy with stomach pull through 2007    -barretts esophagitis  pre cancer Eye surgeries ventral hernia repair 2008    -due to prior abdominal incision ortho knee both kness carotid arteries  Past Pulmonary History:  Pulmonary History: Pulmonary Function Test  Date: 06/10/2008 Height (in.): 70 Gender: Male  Pre-Spirometry  FVC     Value: 3.12 L/min   Pred: 4.37 L/min     % Pred: 71 % FEV1     Value: 1.78 L     Pred: 2.84 L     % Pred: 61 % FEV1/FVC   Value: 55 %     Pred: 67 %     FEF 25-75   Value: 0.51 L/min   Pred: 22.43 L/min     % Pred: 21 %  Post-Spirometry  FVC     Value: 3.06 L/min   Pred: 4.37 L/min     % Pred: 70 % FEV1     Value: 1.73 L     Pred: 2.84 L     % Pred: 61 % FEV1/FVC   Value: 56 %     Pred: 67 %     FEF 25-75   Value: 0.49 L/min   Pred: 2.43 L/min     % Pred: 20 %  Lung Volumes  TLC     Value: 4.73 L   % Pred: 71 % RV     Value: 1.61 L   % Pred: 59 % DLCO     Value: 17.9 %   % Pred: 87 % DLCO/VA   Value: 3.68 %   % Pred: 115 %  Comments:  Severe obstructive defect,  normal DLCO,  mild restriction  6 min walk test No desaturation Interpretation:  Number of laps  8 X 48 meters =   384 meters+ final partial lap: 40 meters =    424 meters   Total distance walked in six minutes: 424 meters  Tech ID: Tivis Ringer (Jun 10, 2008 12:01 PM) Tech Comments pt completed test w/ 0 rest  breaks and 0 complaints  Family History: Reviewed history from 05/20/2008 and no changes required. Family History Colon Cancer mother, MGF Family History Lung Cancer  mother  Family History MI/Heart Attack mother , 2 brothers Esophageal/stomach cancer :  father   Social History: Reviewed history from 08/28/2008 and no changes required. Patient states former smoker.  retired Actor Occupation: retired Alcohol Use - no Illicit Drug Use - no  Review of Systems       The patient complains of shortness of breath with activity, shortness of breath at rest, productive cough, and non-productive cough.  The patient denies coughing up blood, chest pain, irregular heartbeats, acid heartburn, indigestion, loss of appetite, weight change, abdominal pain, difficulty swallowing, sore throat, tooth/dental problems, headaches, nasal congestion/difficulty breathing through nose, sneezing, itching, ear ache, anxiety, depression, hand/feet swelling, joint stiffness or pain, rash, change in color of mucus, and fever.  Vital Signs:  Patient profile:   77 year old male Height:      69 inches Weight:      182 pounds BMI:     26.97 O2 Sat:      97 % on Room air Temp:     97.8 degrees F oral Pulse rate:   116 / minute BP sitting:   180 / 70  (left arm) Cuff size:   regular  Vitals Entered By: Gweneth Dimitri RN (May 29, 2009 1:32 PM)  O2 Flow:  Room air CC: 4 month follow up.  Pt states he is not having any problems with breathing but does state he has wheezing "all the time."  States he does have a prod cough with clear phelgm and chest congestion.  Would like to discuss getting a rx for prednsione. Comments Medications reviewed with patient Daytime contact number verified with patient. Gweneth Dimitri RN  May 29, 2009 1:32 PM    Physical Exam  Additional Exam:  Gen: Pleasant, well-nourished, in no distress,  normal affect ENT: No lesions,  mouth clear,  oropharynx clear, no postnasal drip Neck: No JVD, no TMG, no carotid bruits Lungs: No use of accessory muscles, no dullness to percussion, distant BS, exp wheeze Cardiovascular: RRR, heart sounds normal, no murmur or gallops, no peripheral edema Abdomen: soft and NT, no HSM,  BS normal Musculoskeletal: No deformities, no cyanosis or clubbing Neuro: alert, non focal Skin: Warm, no lesions or  rashes    CXR  Procedure date:  05/29/2009  Findings:      IMPRESSION:   1.  No acute cardiopulmonary abnormalities.    Impression & Recommendations:  Problem # 1:  C O P D (ICD-496) Assessment Deteriorated COPD with exacerbation and flare.  Note CXR normal plan increase qvar pulse prednisone  Medications Added to Medication List This Visit: 1)  Qvar 80 Mcg/act Aers (Beclomethasone dipropionate) .... 2 puffs twice daily 2)  Alphagan P 0.1 % Soln (Brimonidine tartrate) .Marland Kitchen.. 1 gtt left eye two times a day 3)  Valacyclovir Hcl 500 Mg Tabs (Valacyclovir hcl) .... As needed 4)  Serevent Diskus 50 Mcg/dose Aepb (Salmeterol xinafoate) .... Two times a day 5)  Prednisone 10 Mg Tabs (Prednisone) .... Take as directed 4 each am x3days, 3 x 3days, 2 x 3days, 1 x 3days then stop  Complete Medication List: 1)  Spiriva Handihaler 18 Mcg Caps (Tiotropium bromide monohydrate) .... Inhale contents of 1 capsule once a day 2)  Proair Hfa 108 (90 Base) Mcg/act Aers (Albuterol sulfate) .Marland Kitchen.. 1-2 puffs every 4-6 hours as needed 3)  Simvastatin 80 Mg Tabs (Simvastatin) .... Take 1/2 tab by mouth at bedtime 4)  Omeprazole 20 Mg Tbec (Omeprazole) .... Take 1  tab  by mouth daily 5)  Metformin Hcl 500 Mg Tabs (Metformin hcl) .... Take 2 tabs by mouth each morning and 2 tab by mouth each evening 6)  Aspirin Low Dose 81 Mg Tabs (Aspirin) .... Take 1 tablet by mouth once a day 7)  Diazepam 5 Mg Tabs (Diazepam) .... 1/2 by mouth once daily as needed 8)  Promethazine Hcl 25 Mg Tabs (Promethazine hcl) .... 1/2 to 1 by mouth as needed 9)  Clonidine Hcl 0.2 Mg Tabs (Clonidine hcl) .Marland Kitchen.. 1 tab by mouth as needed when bp is over 140 10)  Qvar 80 Mcg/act Aers (Beclomethasone dipropionate) .... 2 puffs twice daily 11)  Losartan Potassium-hctz 100-12.5 Mg Tabs (Losartan potassium-hctz) .... Once daily 12)  Alphagan  P 0.1 % Soln (Brimonidine tartrate) .Marland Kitchen.. 1 gtt left eye two times a day 13)  Valacyclovir Hcl 500 Mg  Tabs (Valacyclovir hcl) .... As needed 14)  Serevent Diskus 50 Mcg/dose Aepb (Salmeterol xinafoate) .... Two times a day 15)  Prednisone 10 Mg Tabs (Prednisone) .... Take as directed 4 each am x3days, 3 x 3days, 2 x 3days, 1 x 3days then stop  Other Orders: Est. Patient Level IV (81191) Prescription Created Electronically [phone]) T-2 View CXR (71020TC)  Patient Instructions: 1)  Prednisone 10mg  4 each am x3days, 3 x 3days, 2 x 3days, 1 x 3days then stop 2)  Increase Qvar to two puff twice daily 3)  No othe medication changes 4)  Return 3 months  Prescriptions: PREDNISONE 10 MG  TABS (PREDNISONE) Take as directed 4 each am x3days, 3 x 3days, 2 x 3days, 1 x 3days then stop  #30 x 0   Entered and Authorized by:   Storm Frisk MD   Signed by:   Storm Frisk MD on 05/29/2009   Method used:   [facility]        [facility] [address] [phone]* (retail)       [address]       [place], [place]  [address]       Ph: [phone] or [phone]       Fax: [phone]   RxID:   [id number] QVAR 80 MCG/ACT AERS (BECLOMETHASONE DIPROPIONATE) 2 puffs twice daily  #1 x 6   Entered and Authorized by:   Storm Frisk MD   Signed by:   Storm Frisk MD on 05/29/2009   Method used:   [facility]        [facility] [address] [phone]* (retail)       [address]       [place], [place]  [address]       Ph: [phone] or [phone]       Fax: [phone]   RxID:   [id number]

## 2010-02-23 NOTE — Assessment & Plan Note (Signed)
Summary: Acute NP office visit - bronchitis   Copy to:  n/a Primary Provider/Referring Provider:  Henri Medal  CC:  prod cough with green mucus, wheezing, and increased SOB x2weeks - denies f/c/s.  currently on 3rd day of 3tabs of 10mg  pred taper.  History of Present Illness:   77 year old man with COPD primary emphysematous component..   Prior esophagectomy 2007 and abn CXR since that surgery.    February 20, 2009 10:13 AM No cough,  Shortness of breath is stable.  No real chest pain.  Sl wheeze.  No edema in feet. Pt denies any significant sore throat, nasal congestion or excess secretions, fever, chills, sweats, unintended weight loss, pleurtic or exertional chest pain, orthopnea PND, or leg swelling Pt denies any increase in rescue therapy over baseline, denies waking up needing it or having any early am or nocturnal exacerbations of coughing/wheezing/or dyspnea.  May 29, 2009 1:41 PM More dyspnea and wheezing is noted and gotten worse over the past few months.  More cough and productive clear white mucous.  No real chest pain.  Notes more wheezing.  No real edema.    July 30, 2009--Presents for an acute office visit. Complains of prod cough with green mucus, wheezing, increased SOB x2weeks - He is  currently on 3rd day of 3tabs of 10mg  pred taper. Had left over steroid taper that he did not take in past. He is still coughing w/ thick green mucus. Wheezing is some better. Denies chest pain,  orthopnea, hemoptysis, fever, n/v/d, edema, headache.   Medications Prior to Update: 1)  Spiriva Handihaler 18 Mcg Caps (Tiotropium Bromide Monohydrate) .... Inhale Contents of 1 Capsule Once A Day 2)  Proair Hfa 108 (90 Base) Mcg/act  Aers (Albuterol Sulfate) .Marland Kitchen.. 1-2 Puffs Every 4-6 Hours As Needed 3)  Simvastatin 80 Mg Tabs (Simvastatin) .... Take 1/2 Tab By Mouth At Bedtime 4)  Omeprazole 20 Mg Tbec (Omeprazole) .... Take 1  Tab  By Mouth Daily 5)  Metformin Hcl 500 Mg Tabs (Metformin Hcl)  .... Take 2 Tabs By Mouth Each Morning and 2 Tab By Mouth Each Evening 6)  Aspirin Low Dose 81 Mg Tabs (Aspirin) .... Take 1 Tablet By Mouth Once A Day 7)  Diazepam 5 Mg Tabs (Diazepam) .... 1/2 By Mouth Once Daily As Needed 8)  Promethazine Hcl 25 Mg Tabs (Promethazine Hcl) .... 1/2 To 1 By Mouth As Needed 9)  Clonidine Hcl 0.2 Mg Tabs (Clonidine Hcl) .Marland Kitchen.. 1 Tab By Mouth As Needed When Bp Is Over 140 10)  Qvar 80 Mcg/act Aers (Beclomethasone Dipropionate) .... 2 Puffs Twice Daily 11)  Losartan Potassium-Hctz 100-12.5 Mg Tabs (Losartan Potassium-Hctz) .... Once Daily 12)  Alphagan P 0.1 % Soln (Brimonidine Tartrate) .Marland Kitchen.. 1 Gtt Left Eye Two Times A Day 13)  Valacyclovir Hcl 500 Mg Tabs (Valacyclovir Hcl) .... As Needed 14)  Serevent Diskus 50 Mcg/dose Aepb (Salmeterol Xinafoate) .... Two Times A Day 15)  Prednisone 10 Mg  Tabs (Prednisone) .... Take As Directed 4 Each Am X3days, 3 X 3days, 2 X 3days, 1 X 3days Then Stop  Current Medications (verified): 1)  Spiriva Handihaler 18 Mcg Caps (Tiotropium Bromide Monohydrate) .... Inhale Contents of 1 Capsule Once A Day 2)  Proair Hfa 108 (90 Base) Mcg/act  Aers (Albuterol Sulfate) .Marland Kitchen.. 1-2 Puffs Every 4-6 Hours As Needed 3)  Simvastatin 80 Mg Tabs (Simvastatin) .... Take 1/2 Tab By Mouth At Bedtime 4)  Omeprazole 20 Mg Tbec (Omeprazole) .Marland KitchenMarland KitchenMarland Kitchen  Take 1  Tab  By Mouth Daily 5)  Metformin Hcl 500 Mg Tabs (Metformin Hcl) .... Take 2 Tabs By Mouth Each Morning and 2 Tab By Mouth Each Evening 6)  Aspirin Low Dose 81 Mg Tabs (Aspirin) .... Take 1 Tablet By Mouth Once A Day 7)  Diazepam 5 Mg Tabs (Diazepam) .... 1/2 By Mouth Once Daily As Needed 8)  Promethazine Hcl 25 Mg Tabs (Promethazine Hcl) .... 1/2 To 1 By Mouth As Needed 9)  Clonidine Hcl 0.2 Mg Tabs (Clonidine Hcl) .Marland Kitchen.. 1 Tab By Mouth As Needed When Bp Is Over 140 10)  Qvar 80 Mcg/act Aers (Beclomethasone Dipropionate) .... 2 Puffs Twice Daily 11)  Losartan Potassium-Hctz 100-12.5 Mg Tabs (Losartan  Potassium-Hctz) .... Once Daily 12)  Alphagan P 0.1 % Soln (Brimonidine Tartrate) .Marland Kitchen.. 1 Gtt Left Eye Two Times A Day 13)  Valacyclovir Hcl 500 Mg Tabs (Valacyclovir Hcl) .... As Needed 14)  Serevent Diskus 50 Mcg/dose Aepb (Salmeterol Xinafoate) .... Two Times A Day 15)  Prednisone 10 Mg  Tabs (Prednisone) .... Take As Directed 4 Each Am X3days, 3 X 3days, 2 X 3days, 1 X 3days Then Stop 16)  Amlodipine Besylate 5 Mg Tabs (Amlodipine Besylate) .... Take 1 Tablet By Mouth Once A Day 17)  Vitamin D 1000 Unit Tabs (Cholecalciferol) .... Take 1 Tablet By Mouth Once A Day 18)  Multivitamins   Tabs (Multiple Vitamin) .... Take 1 Tablet By Mouth Once A Day 19)  Saw Palmetto 500 Mg Caps (Saw Palmetto (Serenoa Repens)) .... Take 1 Capsule By Mouth Once A Day 20)  Zinc 100 Mg Tabs (Zinc) .... Take 1 Tablet By Mouth Once A Day 21)  Vitamin E 400 Unit Caps (Vitamin E) .... Take 1 Capsule By Mouth Once A Day 22)  Glimepiride 2 Mg Tabs (Glimepiride) .... Take 1 Tablet By Mouth Every Morning  Allergies (verified): 1)  ! * Avelox 2)  ! * "quinolone Family" No Avelox, Levaquin, Cipro , Factive 3)  Penicillin 4)  Sulfa  Past History:  Past Medical History: Last updated: 08/27/2008 Current Problems:  FATTY LIVER DISEASE (ICD-571.8) CAROTID ARTERY STENOSIS, BILATERAL (ICD-433.10) DIVERTICULOSIS, COLON (ICD-562.10) GASTRITIS (ICD-535.50) HIATAL HERNIA WITH REFLUX (ICD-553.3) DYSPNEA (ICD-786.05) C O P D (ICD-496) BASAL CELL CARCINOMA, HX OF (ICD-V10.83) BARRETTS ESOPHAGUS (ICD-530.85) ARTHRITIS (ICD-716.90) TRANSIENT ISCHEMIC ATTACKS, HX OF (ICD-V12.50) DM (ICD-250.00) HYPERLIPIDEMIA (ICD-272.4) ACID REFLUX DISEASE (ICD-530.81) GLAUCOMA (ICD-365.9) HYPERTENSION (ICD-401.9)  Past Surgical History: Last updated: 08/28/2008 Esophagectomy with stomach pull through 2007    -barretts esophagitis  pre cancer Eye surgeries ventral hernia repair 2008    -due to prior abdominal incision ortho knee  both kness carotid arteries  Family History: Last updated: 05/20/2008 Family History Colon Cancer mother, MGF Family History Lung Cancer  mother  Family History MI/Heart Attack mother , 2 brothers Esophageal/stomach cancer :  father   Social History: Last updated: 08/28/2008 Patient states former smoker.  retired Armed forces operational officer Occupation: retired Alcohol Use - no Illicit Drug Use - no  Risk Factors: Smoking Status: quit > 6 months (05/29/2009)  Past Pulmonary History:  Pulmonary History: Pulmonary Function Test  Date: 06/10/2008 Height (in.): 70 Gender: Male  Pre-Spirometry  FVC     Value: 3.12 L/min   Pred: 4.37 L/min     % Pred: 71 % FEV1     Value: 1.78 L     Pred: 2.84 L     % Pred: 61 % FEV1/FVC   Value: 55 %  Pred: 67 %     FEF 25-75   Value: 0.51 L/min   Pred: 22.43 L/min     % Pred: 21 %  Post-Spirometry  FVC     Value: 3.06 L/min   Pred: 4.37 L/min     % Pred: 70 % FEV1     Value: 1.73 L     Pred: 2.84 L     % Pred: 61 % FEV1/FVC   Value: 56 %     Pred: 67 %     FEF 25-75   Value: 0.49 L/min   Pred: 2.43 L/min     % Pred: 20 %  Lung Volumes  TLC     Value: 4.73 L   % Pred: 71 % RV     Value: 1.61 L   % Pred: 59 % DLCO     Value: 17.9 %   % Pred: 87 % DLCO/VA   Value: 3.68 %   % Pred: 115 %  Comments:  Severe obstructive defect,  normal DLCO,  mild restriction  6 min walk test No desaturation Interpretation:  Number of laps  8 X 48 meters =   384 meters+ final partial lap: 40 meters =    424 meters   Total distance walked in six minutes: 424 meters  Tech ID: Nicholos Johns Perdue (Jun 10, 2008 12:01 PM) Tech Comments pt completed test w/ 0 rest  breaks and 0 complaints  Review of Systems      See HPI  Vital Signs:  Patient profile:   77 year old male Height:      69 inches Weight:      188.31 pounds BMI:     27.91 O2 Sat:      96 % on Room air Temp:     97.6 degrees F oral Pulse rate:   72 / minute BP sitting:   148 / 70  (left  arm) Cuff size:   regular  Vitals Entered By: Boone Master CNA/MA (July 30, 2009 3:06 PM)  O2 Flow:  Room air CC: prod cough with green mucus, wheezing, increased SOB x2weeks - denies f/c/s.  currently on 3rd day of 3tabs of 10mg  pred taper Is Patient Diabetic? No Comments Medications reviewed with patient Daytime contact number verified with patient. Boone Master CNA/MA  July 30, 2009 3:06 PM    Physical Exam  Additional Exam:  Gen: Pleasant, well-nourished, in no distress,  normal affect ENT: No lesions,  mouth clear,  oropharynx clear, no postnasal drip Neck: No JVD, no TMG, no carotid bruits Lungs: No use of accessory muscles, no dullness to percussion, distant BS, exp wheeze Cardiovascular: RRR, heart sounds normal, no murmur or gallops, no peripheral edema Abdomen: soft and NT, no HSM,  BS normal Musculoskeletal: No deformities, no cyanosis or clubbing Neuro: alert, non focal Skin: Warm, no lesions or rashes    Impression & Recommendations:  Problem # 1:  C O P D (ICD-496)  Exacerbation  REC: Doxycycline 100mg  two times a day for 7 days Mucinex DM two times a day as needed coughc/ongestion  Finish prednisone taper as directed.  Brush, rinse and gargle after inhaler use. Use baking soda /peroxide toothpaste/mouthwash . Drink cup of water after finish rinse.  follow up Dr. Delford Field in 1 month .    Orders: Nebulizer Tx (45409) Est. Patient Level III (81191)  Medications Added to Medication List This Visit: 1)  Foradil Aerolizer 12 Mcg Caps (Formoterol fumarate) .Marland Kitchen.. 1 puff  two times a day 2)  Amlodipine Besylate 5 Mg Tabs (Amlodipine besylate) .... Take 1 tablet by mouth once a day 3)  Vitamin D 1000 Unit Tabs (Cholecalciferol) .... Take 1 tablet by mouth once a day 4)  Multivitamins Tabs (Multiple vitamin) .... Take 1 tablet by mouth once a day 5)  Saw Palmetto 500 Mg Caps (Saw palmetto (serenoa repens)) .... Take 1 capsule by mouth once a day 6)  Zinc 100 Mg Tabs  (Zinc) .... Take 1 tablet by mouth once a day 7)  Vitamin E 400 Unit Caps (Vitamin e) .... Take 1 capsule by mouth once a day 8)  Glimepiride 2 Mg Tabs (Glimepiride) .... Take 1 tablet by mouth every morning 9)  Doxycycline Hyclate 100 Mg Caps (Doxycycline hyclate) .Marland Kitchen.. 1 by mouth two times a day  Patient Instructions: 1)  Doxycycline 100mg  two times a day for 7 days 2)  Mucinex DM two times a day as needed coughc/ongestion  3)  Finish prednisone taper as directed.  4)  Brush, rinse and gargle after inhaler use. Use baking soda /peroxide toothpaste/mouthwash . Drink cup of water after finish rinse.  5)  follow up Dr. Delford Field in 1 month .  Prescriptions: DOXYCYCLINE HYCLATE 100 MG CAPS (DOXYCYCLINE HYCLATE) 1 by mouth two times a day  #14 x 0   Entered and Authorized by:   Rubye Oaks NP   Signed by:   Tammy Parrett NP on 07/30/2009   Method used:   [facility]        [facility] [address] [phone]* (retail)       [address]       [place], [place]  [address]       Ph: [phone] or [phone]       Fax: [phone]   RxID:   [id number]    Immunization History:  Tetanus/Td Immunization History:    Tetanus/Td:  historical (11/18/2002)  Pneumovax Immunization History:    Pneumovax:  historical (03/17/2009)    Medication Administration  Medication # 1:    Medication: Xopenex 1.25mg     Diagnosis: C O P D (ICD-496)    Dose: 1 vial    Route: inhaled    Exp Date: 08/25/2010    Lot #: [id number]    Mfr: sepracor    Patient tolerated medication without complications    Given by: Kandice Hams CMA (July 30, 2009 3:42 PM)  Orders Added: 1)  Nebulizer Tx [95188] 2)  Est. Patient Level III [41660]   Appended Document: Acute NP office visit - bronchitis i agree with this plan of care   pw

## 2010-02-23 NOTE — Progress Notes (Signed)
Summary: rx not avail  Phone Note From Pharmacy   Caller: [facility] Call For: [facility]  Summary of Call: clarithromycin on backorder- not avail per alecia- [facility]. [phone] Initial call taken by: Tivis Ringer, CNA,  November 25, 2009 12:15 PM  Follow-up for Phone Call        PW, please advise of another abx for patient.Thanks.Reynaldo Minium CMA  November 25, 2009 12:53 PM   Additional Follow-up for Phone Call Additional follow up Details #1::        biaxin is ok branded  Additional Follow-up by: Storm Frisk MD,  November 25, 2009 2:51 PM    Additional Follow-up for Phone Call Additional follow up Details #2::    Spoke with Dorene Grebe at [facility]-this message was taken care of a couple of days ago-she is unsure why someone called here-states that the patient did pick up RX and is currently taking the medication.Reynaldo Minium CMA  November 25, 2009 3:29 PM

## 2010-02-23 NOTE — Miscellaneous (Signed)
Summary: med change ok per RB  Clinical Lists Changes      [facility] that the [facility]ithromycin was on backorder and the pharmacy wanted to know if it was ok to change to the clarithromycin xr at 2 daily----per RB was asked and stated that this was ok to change ---i called the pharmacy and they  are aware. Randell Loop [facility]  November 20, 2009 4:49 PM

## 2010-02-23 NOTE — Progress Notes (Signed)
Summary: appt  Phone Note Call from Patient Call back at Home Phone [phone] Call back at [phone]   Caller: Patient Call For: wright Summary of Call: Chest congestion since last ov, wants to be seen today, pls advise. Initial call taken by: Darletta Moll,  August 19, 2009 12:05 PM  Follow-up for Phone Call        called and spoke with pt and he stated that he did get better but once he finished the pred taper and the abx the chest congestion came back.  increased SOB.  all this since the last ov--he stated that the mucus has no color.  please advise.  thanks Randell Loop CMA  August 19, 2009 12:11 PM   Additional Follow-up for Phone Call Additional follow up Details #1::        work him in this PM pls with Me Additional Follow-up by: Storm Frisk MD,  August 19, 2009 12:13 PM    Additional Follow-up for Phone Call Additional follow up Details #2::    The patient is aware and will come for OV today with Dr. Delford Field at 2pm. Follow-up by: Michel Bickers CMA,  August 19, 2009 12:22 PM

## 2010-02-23 NOTE — Progress Notes (Signed)
Summary: pharmacy did not receive rx [facility]  Phone Note Call from Patient Call back at Home Phone [phone]   Caller: Patient Call For: wright Summary of Call: pt has questions about bixan Initial call taken by: Lacinda Axon,  November 26, 2009 12:11 PM  Follow-up for Phone Call        called spoke with patient who [facility] not [facility] his new rx for the biaxin.  verified pharmacy with patient.  told patient will call pharmacy and give verbal order.  called spoke with [facility]ist at [facility].  verified that rx was not received yesterday.  gave verbal for the clarythromycin 500mg  1 by mouth two times a day #6 as written by PEW in yesterday's ov.  pharmacist read rx back to me to verify accuracy. Follow-up by: Boone Master CNA/MA,  November 26, 2009 12:26 PM

## 2010-02-23 NOTE — Assessment & Plan Note (Signed)
Summary: Pulmonary OV   Copy to:  n/a Primary Provider/Referring Provider:  Henri Medal  CC:  1 wk f/u. Feeling "much better."  COugh, wheezing, and SOB have improved.  Still has some coughing with small amount of green mucus.  Chest tightness resolved.Marland Kitchen  History of Present Illness:   77 year old man with COPD primary emphysematous component..   Prior esophagectomy 2007 and abn CXR since that surgery.    February 20, 2009 10:13 AM No cough,  Shortness of breath is stable.  No real chest pain.  Sl wheeze.  No edema in feet. Pt denies any significant sore throat, nasal congestion or excess secretions, fever, chills, sweats, unintended weight loss, pleurtic or exertional chest pain, orthopnea PND, or leg swelling Pt denies any increase in rescue therapy over baseline, denies waking up needing it or having any early am or nocturnal exacerbations of coughing/wheezing/or dyspnea.  May 29, 2009 1:41 PM More dyspnea and wheezing is noted and gotten worse over the past few months.  More cough and productive clear white mucous.  No real chest pain.  Notes more wheezing.  No real edema.    July 30, 2009--Presents for an acute office visit. Complains of prod cough with green mucus, wheezing, increased SOB x2weeks - He is  currently on 3rd day of 3tabs of 10mg  pred taper. Had left over steroid taper that he did not take in past. He is still coughing w/ thick green mucus. Wheezing is some better. Denies chest pain,  orthopnea, hemoptysis, fever, n/v/d, edema, headache.   August 19, 2009 2:05 PM Pt seen for exac and rx doxy and pred per NP on 07/30/09. The pt finished all meds and now is no better.  The  mucus now,however,  is clearer.  The pt is still dyspneic and worse with heat.  No real chest pain.  Notes some wheeze.  No hemoptysis, n/v, f/c/s.September 16, 2009 4:21 PM Pt is much improved Rodney Booze was not taken due to concerns for suicidal ideation off pred now biaxin also called in and this  helped  October 28, 2009 2:12 PM  The pt was ok but then over past week more cough, productive of  green mucus.  The pt notes more dyspnea.  No real wheeze.  No real chest pain.   No f/c/s.   Pt denies any significant sore throat, nasal congestion or excess secretions, fever, chills, sweats, unintended weight loss, pleurtic or exertional chest pain, orthopnea PND, or leg swelling Pt denies any increase in rescue therapy over baseline, denies waking up needing it or having any early am or nocturnal exacerbations of coughing/wheezing/or dyspnea.  November 25, 2009 11:48 AM Had to take another round of biaxin 500 two times a day.  Pt is 1/2 way through. Symptoms now is better compared to last week.   Noted more cough, mucus was green to yellow.  Notes some sinus drainage.  No sinus pressure or pain.  Was wheezing with this.   Current Medications (verified): 1)  Spiriva Handihaler 18 Mcg Caps (Tiotropium Bromide Monohydrate) .... Inhale Contents of 1 Capsule Once A Day 2)  Simvastatin 80 Mg Tabs (Simvastatin) .... Take 1/2 Tab By Mouth At Bedtime 3)  Omeprazole 20 Mg Tbec (Omeprazole) .... Take 1  Tab  By Mouth Daily 4)  Metformin Hcl 500 Mg Tabs (Metformin Hcl) .... Take 2 Tabs By Mouth Each Morning and 2 Tab By Mouth Each Evening 5)  Aspirin Low Dose 81 Mg  Tabs (Aspirin) .... Take 1 Tablet By Mouth Once A Day 6)  Qvar 80 Mcg/act Aers (Beclomethasone Dipropionate) .... 2 Puffs Twice Daily 7)  Losartan Potassium 100 Mg Tabs (Losartan Potassium) .... Take 1 Tablet By Mouth Once A Day 8)  Alphagan P 0.1 % Soln (Brimonidine Tartrate) .Marland Kitchen.. 1 Gtt Left Eye Two Times A Day 9)  Foradil Aerolizer 12 Mcg Caps (Formoterol Fumarate) .Marland Kitchen.. 1 Puff Two Times A Day 10)  Amlodipine Besylate 5 Mg Tabs (Amlodipine Besylate) .... Take 1 Tablet By Mouth Once A Day 11)  Vitamin D 1000 Unit Tabs (Cholecalciferol) .... Take 1 Tablet By Mouth Once A Day 12)  Multivitamins   Tabs (Multiple Vitamin) .... Take 1 Tablet By  Mouth Once A Day 13)  Saw Palmetto 500 Mg Caps (Saw Palmetto (Serenoa Repens)) .... Take 1 Capsule By Mouth Once A Day 14)  Zinc 100 Mg Tabs (Zinc) .... Take 1 Tablet By Mouth Once A Day 15)  Vitamin E 400 Unit Caps (Vitamin E) .... Take 1 Capsule By Mouth Once A Day 16)  Glimepiride 2 Mg Tabs (Glimepiride) .... As Needed For High Blood Sugar 17)  Proair Hfa 108 (90 Base) Mcg/act  Aers (Albuterol Sulfate) .Marland Kitchen.. 1-2 Puffs Every 4-6 Hours As Needed 18)  Valacyclovir Hcl 500 Mg Tabs (Valacyclovir Hcl) .... As Needed 19)  Clonidine Hcl 0.2 Mg Tabs (Clonidine Hcl) .Marland Kitchen.. 1 Tab By Mouth As Needed When Bp Is Over 140 20)  Promethazine Hcl 25 Mg Tabs (Promethazine Hcl) .... 1/2 To 1 By Mouth As Needed 21)  Diazepam 5 Mg Tabs (Diazepam) .... 1/2 By Mouth Once Daily As Needed 22)  Biaxin 500 Mg Tabs (Clarithromycin) .... Take 1 Tablet By Mouth Two Times A Day X7days  Allergies (verified): 1)  ! * Avelox 2)  ! * "quinolone Family" No Avelox, Levaquin, Cipro , Factive 3)  Penicillin 4)  Sulfa  Past History:  Past medical, surgical, family and social histories (including risk factors) reviewed, and no changes noted (except as noted below).  Past Medical History: Reviewed history from 08/27/2008 and no changes required. Current Problems:  FATTY LIVER DISEASE (ICD-571.8) CAROTID ARTERY STENOSIS, BILATERAL (ICD-433.10) DIVERTICULOSIS, COLON (ICD-562.10) GASTRITIS (ICD-535.50) HIATAL HERNIA WITH REFLUX (ICD-553.3) DYSPNEA (ICD-786.05) C O P D (ICD-496) BASAL CELL CARCINOMA, HX OF (ICD-V10.83) BARRETTS ESOPHAGUS (ICD-530.85) ARTHRITIS (ICD-716.90) TRANSIENT ISCHEMIC ATTACKS, HX OF (ICD-V12.50) DM (ICD-250.00) HYPERLIPIDEMIA (ICD-272.4) ACID REFLUX DISEASE (ICD-530.81) GLAUCOMA (ICD-365.9) HYPERTENSION (ICD-401.9)  Past Surgical History: Reviewed history from 08/28/2008 and no changes required. Esophagectomy with stomach pull through 2007    -barretts esophagitis  pre cancer Eye  surgeries ventral hernia repair 2008    -due to prior abdominal incision ortho knee both kness carotid arteries  Past Pulmonary History:  Pulmonary History: Pulmonary Function Test  Date: 06/10/2008 Height (in.): 70 Gender: Male  Pre-Spirometry  FVC     Value: 3.12 L/min   Pred: 4.37 L/min     % Pred: 71 % FEV1     Value: 1.78 L     Pred: 2.84 L     % Pred: 61 % FEV1/FVC   Value: 55 %     Pred: 67 %     FEF 25-75   Value: 0.51 L/min   Pred: 22.43 L/min     % Pred: 21 %  Post-Spirometry  FVC     Value: 3.06 L/min   Pred: 4.37 L/min     % Pred: 70 % FEV1     Value: 1.73 L  Pred: 2.84 L     % Pred: 61 % FEV1/FVC   Value: 56 %     Pred: 67 %     FEF 25-75   Value: 0.49 L/min   Pred: 2.43 L/min     % Pred: 20 %  Lung Volumes  TLC     Value: 4.73 L   % Pred: 71 % RV     Value: 1.61 L   % Pred: 59 % DLCO     Value: 17.9 %   % Pred: 87 % DLCO/VA   Value: 3.68 %   % Pred: 115 %  Comments:  Severe obstructive defect,  normal DLCO,  mild restriction  6 min walk test No desaturation Interpretation:  Number of laps  8 X 48 meters =   384 meters+ final partial lap: 40 meters =    424 meters   Total distance walked in six minutes: 424 meters  Tech ID: Tivis Ringer (Jun 10, 2008 12:01 PM) Tech Comments pt completed test w/ 0 rest  breaks and 0 complaints  Family History: Reviewed history from 05/20/2008 and no changes required. Family History Colon Cancer mother, MGF Family History Lung Cancer  mother  Family History MI/Heart Attack mother , 2 brothers Esophageal/stomach cancer :  father   Social History: Reviewed history from 10/28/2009 and no changes required. Patient states former smoker  Quit in 1980's.  1 ppd x 40 yrs  retired Armed forces operational officer Occupation: retired Alcohol Use - no Illicit Drug Use - no  Review of Systems       The patient complains of shortness of breath with activity.  The patient denies shortness of breath at rest, productive cough,  non-productive cough, coughing up blood, chest pain, irregular heartbeats, acid heartburn, indigestion, loss of appetite, weight change, abdominal pain, difficulty swallowing, sore throat, tooth/dental problems, headaches, nasal congestion/difficulty breathing through nose, sneezing, itching, ear ache, anxiety, depression, hand/feet swelling, joint stiffness or pain, rash, change in color of mucus, and fever.    Vital Signs:  Patient profile:   77 year old male Height:      69 inches Weight:      180.38 pounds BMI:     26.73 O2 Sat:      97 % on Room air Temp:     98.1 degrees F oral Pulse rate:   83 / minute BP sitting:   112 / 66  (right arm) Cuff size:   regular  Vitals Entered By: Gweneth Dimitri RN (November 25, 2009 11:36 AM)  O2 Flow:  Room air CC: 1 wk f/u. Feeling "much better."  COugh, wheezing, SOB have improved.  Still has some coughing with small amount of green mucus.  Chest tightness resolved. Comments Medications reviewed with patient Daytime contact number verified with patient. Gweneth Dimitri RN  November 25, 2009 11:36 AM    Physical Exam  Additional Exam:  Gen: Pleasant, well-nourished, in no distress,  normal affect ENT: No lesions,  mouth clear,  oropharynx clear, no postnasal drip Neck: No JVD, no TMG, no carotid bruits Lungs: No use of accessory muscles, no dullness to percussion, distant BS,  improved scattered rhonchi Cardiovascular: RRR, heart sounds normal, no murmur or gallops, no peripheral edema Abdomen: soft and NT, no HSM,  BS normal Musculoskeletal: No deformities, no cyanosis or clubbing Neuro: alert, non focal Skin: Warm, no lesions or rashes    Impression & Recommendations:  Problem # 1:  ACUTE BRONCHITIS (ICD-466.0) Assessment Improved recurrent  tracheobronchits improved with Rx, pt declined daliresp on the basis of prior suicidal ideation and fear of exacerbation of same due to packagei nsert plan complete 10days biaxin 500 two times a  day increase qvar to 4 puff two times a day  The following medications were removed from the medication list:    Zithromax Z-pak 250 Mg Tabs (Azithromycin) .Marland Kitchen... Take as directed His updated medication list for this problem includes:    Spiriva Handihaler 18 Mcg Caps (Tiotropium bromide monohydrate) ..... Inhale contents of 1 capsule once a day    Qvar 80 Mcg/act Aers (Beclomethasone dipropionate) .Marland KitchenMarland KitchenMarland KitchenMarland Kitchen 4  puffs twice daily until sample gone then resume 2 puffs twice daily    Foradil Aerolizer 12 Mcg Caps (Formoterol fumarate) .Marland Kitchen... 1 puff two times a day    Proair Hfa 108 (90 Base) Mcg/act Aers (Albuterol sulfate) .Marland Kitchen... 1-2 puffs every 4-6 hours as needed    Clarithromycin 500 Mg Tabs (Clarithromycin) ..... One by mouth two times a day  Orders: Est. Patient Level IV ([address])  Medications Added to Medication List This Visit: 1)  Qvar 80 Mcg/act Aers (Beclomethasone dipropionate) .... 4  puffs twice daily until sample gone then resume 2 puffs twice daily 2)  Clarithromycin 500 Mg Tabs (Clarithromycin) .... One by mouth two times a day  Complete Medication List: 1)  Spiriva Handihaler 18 Mcg Caps (Tiotropium bromide monohydrate) .... Inhale contents of 1 capsule once a day 2)  Simvastatin 80 Mg Tabs (Simvastatin) .... Take 1/2 tab by mouth at bedtime 3)  Omeprazole 20 Mg Tbec (Omeprazole) .... Take 1  tab  by mouth daily 4)  Metformin Hcl 500 Mg Tabs (Metformin hcl) .... Take 2 tabs by mouth each morning and 2 tab by mouth each evening 5)  Aspirin Low Dose 81 Mg Tabs (Aspirin) .... Take 1 tablet by mouth once a day 6)  Qvar 80 Mcg/act Aers (Beclomethasone dipropionate) .... 4  puffs twice daily until sample gone then resume 2 puffs twice daily 7)  Losartan Potassium 100 Mg Tabs (Losartan potassium) .... Take 1 tablet by mouth once a day 8)  Alphagan P 0.1 % Soln (Brimonidine tartrate) .Marland Kitchen.. 1 gtt left eye two times a day 9)  Foradil Aerolizer 12 Mcg Caps (Formoterol fumarate) .Marland Kitchen.. 1 puff two  times a day 10)  Amlodipine Besylate 5 Mg Tabs (Amlodipine besylate) .... Take 1 tablet by mouth once a day 11)  Vitamin D 1000 Unit Tabs (Cholecalciferol) .... Take 1 tablet by mouth once a day 12)  Multivitamins Tabs (Multiple vitamin) .... Take 1 tablet by mouth once a day 13)  Saw Palmetto 500 Mg Caps (Saw palmetto (serenoa repens)) .... Take 1 capsule by mouth once a day 14)  Zinc 100 Mg Tabs (Zinc) .... Take 1 tablet by mouth once a day 15)  Vitamin E 400 Unit Caps (Vitamin e) .... Take 1 capsule by mouth once a day 16)  Glimepiride 2 Mg Tabs (Glimepiride) .... As needed for high blood sugar 17)  Proair Hfa 108 (90 Base) Mcg/act Aers (Albuterol sulfate) .Marland Kitchen.. 1-2 puffs every 4-6 hours as needed 18)  Valacyclovir Hcl 500 Mg Tabs (Valacyclovir hcl) .... As needed 19)  Clonidine Hcl 0.2 Mg Tabs (Clonidine hcl) .Marland Kitchen.. 1 tab by mouth as needed when bp is over 140 20)  Promethazine Hcl 25 Mg Tabs (Promethazine hcl) .... 1/2 to 1 by mouth as needed 21)  Diazepam 5 Mg Tabs (Diazepam) .... 1/2 by mouth once daily as needed 22)  Clarithromycin 500 Mg Tabs (Clarithromycin) .... One by mouth two times a day  Patient Instructions: 1)  Take biaxin twice daily for 10days total, 3 days more sent to pharmacy 2)  Increase Qvar to 4 puffs twice daily for one sample then reduce to two puff twice daily 3)  No other medication changes 4)  Return 2 months  Prescriptions: CLARITHROMYCIN 500 MG TABS (CLARITHROMYCIN) one by mouth two times a day  #6 x 0   Entered and Authorized by:   Storm Frisk MD   Signed by:   Storm Frisk MD on 11/25/2009   Method used:   [facility]        [facility]  [address] [facility] [address] [facility] [address] [phone]* (retail)       [address]       [place], [place]  [address]       Ph: [phone] or [phone]       Fax: [phone]   RxID:   [id number]     Prevention & Chronic Care Immunizations   Influenza vaccine: Fluvax 3+  (10/28/2009)    Tetanus booster:  11/18/2002: Historical    Pneumococcal vaccine: Historical  (03/17/2009)    H. zoster vaccine: Not documented  Colorectal Screening   Hemoccult: Not documented    Colonoscopy: Location:  [facility].    (12/01/2004)  Other Screening   PSA: Not documented   Smoking status: quit > 6 months  (10/28/2009)  Diabetes Mellitus   HgbA1C: Not documented    Eye exam: Not documented    Foot exam: Not documented   High risk foot: Not documented   Foot care education: Not documented    Urine microalbumin/creatinine ratio: Not documented  Lipids   Total Cholesterol: Not documented   LDL: Not documented   LDL Direct: Not documented   HDL: Not documented   Triglycerides: Not documented    SGOT (AST): Not documented   SGPT (ALT): Not documented   Alkaline phosphatase: Not documented   Total bilirubin: Not documented  Hypertension   Last Blood Pressure: 112 / 66  (11/25/2009)   Serum creatinine: Not documented   Serum potassium Not documented  Self-Management Support :    Diabetes self-management support: Not documented    Hypertension self-management support: Not documented    Lipid self-management support: Not documented     Appended Document: Pulmonary OV fax ron roberts

## 2010-02-23 NOTE — Progress Notes (Signed)
Summary:  cough with yellow/ green sputum---rx for z pak and pred taper  Phone Note Call from Patient Call back at Home Phone [phone]   Caller: Patient Call For: wright Summary of Call: Pt c/o wheezing, cough x2 days, pls advise.//[facility] Initial call taken by: Darletta Moll,  November 11, 2009 12:07 PM  Follow-up for Phone Call        called and spoke with pt.  pt was recently seen by PW on 10/28/2009 and was given rx for Biaxin.  Pt states Sx did improve but have now returned.  Pt c/o increased sob, wheezing, "raspy voice," chest congestion, coughing up yellow to green sputum, nasal/head congestion and mild tightness in ches.  Pt unsure if he has a fever or not but states his face does feel "flushed."  Pt requesting abx.  Pw not in office this PM.  Will forward message to doc of the day to address.Please advise.  Thank you.  Aundra Millet Reynolds LPN  November 11, 2009 1:38 PM  Allergies (verified):  1)  ! * Avelox 2)  ! * "quinolone Family" No Avelox, Levaquin, Cipro , Factive 3)  Penicillin 4)  Sulfa  Additional Follow-up for Phone Call Additional follow up Details #1::        let pt know that he has an "allergy" to almost every abx class except biaxin, and therefore cannot give him a stronger abx.  Could try biaxin again, or zpak go ahead and call in zpak.  also call in prednisone 20mg  tabs, 2 each day for 2 days, then 1 each day for 2 days, then 1/2 each day for 2 days, then stop. Additional Follow-up by: Barbaraann Share MD,  November 11, 2009 5:10 PM    Additional Follow-up for Phone Call Additional follow up Details #2::    called and spoke with pt.  pt is aware of KC's recs and rx sent to pharmacy.  pt aware.  will forward message to PW as an FYI.  Aundra Millet Reynolds LPN  November 11, 2009 5:20 PM   New/Updated Medications: ZITHROMAX Z-PAK 250 MG TABS (AZITHROMYCIN) take as directed PREDNISONE 20 MG TABS (PREDNISONE) take 2 tabs by mouth daily x 2 days, then 1 tab by mouth daily  x 2 days, then 1/2 tab by mouth daily x 2 days, then stop Prescriptions: PREDNISONE 20 MG TABS (PREDNISONE) take 2 tabs by mouth daily x 2 days, then 1 tab by mouth daily x 2 days, then 1/2 tab by mouth daily x 2 days, then stop  #7 x 0   Entered by:   Arman Filter LPN   Authorized by:   Barbaraann Share MD   Signed by:   Arman Filter LPN on 84/13/2440   Method used:   [facility]        [facility] [address] [facility] [address] [facility] [address] [phone]* (retail)       [address]       [place], [place]  [address]       Ph: [phone] or [phone]       Fax: [phone]   RxID:   [id number] DUKGURKYH Z-PAK 250 MG TABS (AZITHROMYCIN) take as directed  #1 x 0   Entered by:   Arman Filter LPN   Authorized by:   Barbaraann Share MD   Signed by:   Arman Filter LPN on 07/17/7626   Method used:   [facility]ronically to  [facility] [facility] [facility] [address] [phone]* (retail)       [address]       [place], [place]  [address]       Ph: [phone] or [phone]       Fax: [phone]   RxID:   [id number]

## 2010-02-23 NOTE — Progress Notes (Signed)
Summary: cough and congestion > biaxin x7days and appt  Phone Note Call from Patient Call back at [phone]   Caller: Patient Call For: wright Summary of Call: prednisone and z pak have not helped cough and congestion [facility] Initial call taken by: Rickard Patience,  November 20, 2009 11:24 AM  Follow-up for Phone Call        Pt was given biaxin 500mg  x 7 days on 10-28-09 for congestion and cough and states he improved. Then on 11-11-09 pt called c/o productive cough with green phlegm, increased SOB, wheezing, chest congestion, and chest tightness and was prescribed a Pred Taper and a zpak. Pt states he has finished both but does not see any improvement in his symptoms and is requesting an rx for Biaxin again. Please advise.Carron Curie CMA  November 20, 2009 11:36 AM   Additional Follow-up for Phone Call Additional follow up Details #1::        ok to cycle another 7days biaxin 500mg  two times a day He will need an OV next week to reconvene for next steps Additional Follow-up by: Storm Frisk MD,  November 20, 2009 12:00 PM    Additional Follow-up for Phone Call Additional follow up Details #2::    LMOM TCB x1.  rx sent to pharmacy.  appt needs to be scheduled. Boone Master CNA/MA  November 20, 2009 12:22 PM     Patient returning call.   Lehman Prom  November 20, 2009 12:30 PM  called spoke with patient.  advised of PEWs recs as stated above.  pt verablized his understanding.  appt sched w/ PEW 11.2.11 @ 1130.  pt okay with this date and time.  pt aware rx sent to pharmacy. Follow-up by: Boone Master CNA/MA,  November 20, 2009 2:11 PM  New/Updated Medications: BIAXIN 500 MG TABS (CLARITHROMYCIN) Take 1 tablet by mouth two times a day x7days Prescriptions: BIAXIN 500 MG TABS (CLARITHROMYCIN) Take 1 tablet by mouth two times a day x7days  #14 x 0   Entered by:   Boone Master CNA/MA   Authorized by:   Storm Frisk MD   Signed by:   Boone Master CNA/MA on  11/20/2009   Method used:   [facility] to        [facility] [address] [phone]* (retail)       [address]       [place], [place]  [address]       Ph: [phone] or [phone]       Fax: [phone]   RxID:   [id number]

## 2010-02-23 NOTE — Assessment & Plan Note (Signed)
Summary: Pulmonary OV   Copy to:  n/a Primary Provider/Referring Provider:  Henri Medal  CC:  Acute Visit.  Pt states he had a lot of chest congestion and prod cough with green mucus x 2 days ago but sxs are better now.  Also having "some" increased SOB and "a little" wheezing.  Denies f/c/s.Marland Kitchen  History of Present Illness:   77 year old man with COPD primary emphysematous component..   Prior esophagectomy 2007 and abn CXR since that surgery.    February 20, 2009 10:13 AM No cough,  Shortness of breath is stable.  No real chest pain.  Sl wheeze.  No edema in feet. Pt denies any significant sore throat, nasal congestion or excess secretions, fever, chills, sweats, unintended weight loss, pleurtic or exertional chest pain, orthopnea PND, or leg swelling Pt denies any increase in rescue therapy over baseline, denies waking up needing it or having any early am or nocturnal exacerbations of coughing/wheezing/or dyspnea.  May 29, 2009 1:41 PM More dyspnea and wheezing is noted and gotten worse over the past few months.  More cough and productive clear white mucous.  No real chest pain.  Notes more wheezing.  No real edema.    July 30, 2009--Presents for an acute office visit. Complains of prod cough with green mucus, wheezing, increased SOB x2weeks - He is  currently on 3rd day of 3tabs of 10mg  pred taper. Had left over steroid taper that he did not take in past. He is still coughing w/ thick green mucus. Wheezing is some better. Denies chest pain,  orthopnea, hemoptysis, fever, n/v/d, edema, headache.   August 19, 2009 2:05 PM Pt seen for exac and rx doxy and pred per NP on 07/30/09. The pt finished all meds and now is no better.  The  mucus now,however,  is clearer.  The pt is still dyspneic and worse with heat.  No real chest pain.  Notes some wheeze.  No hemoptysis, n/v, f/c/s.September 16, 2009 4:21 PM Pt is much improved Rodney Booze was not taken due to concerns for suicidal ideation off pred  now biaxin also called in and this helped  October 28, 2009 2:12 PM  The pt was ok but then over past week more cough, productive of  green mucus.  The pt notes more dyspnea.  No real wheeze.  No real chest pain.   No f/c/s.   Pt denies any significant sore throat, nasal congestion or excess secretions, fever, chills, sweats, unintended weight loss, pleurtic or exertional chest pain, orthopnea PND, or leg swelling Pt denies any increase in rescue therapy over baseline, denies waking up needing it or having any early am or nocturnal exacerbations of coughing/wheezing/or dyspnea.   Preventive Screening-Counseling & Management  Alcohol-Tobacco     Smoking Status: quit > 6 months     Year Quit: 1990     Pack years: 40 yrs x1 ppd  Current Medications (verified): 1)  Spiriva Handihaler 18 Mcg Caps (Tiotropium Bromide Monohydrate) .... Inhale Contents of 1 Capsule Once A Day 2)  Proair Hfa 108 (90 Base) Mcg/act  Aers (Albuterol Sulfate) .Marland Kitchen.. 1-2 Puffs Every 4-6 Hours As Needed 3)  Simvastatin 80 Mg Tabs (Simvastatin) .... Take 1/2 Tab By Mouth At Bedtime 4)  Omeprazole 20 Mg Tbec (Omeprazole) .... Take 1  Tab  By Mouth Daily 5)  Metformin Hcl 500 Mg Tabs (Metformin Hcl) .... Take 2 Tabs By Mouth Each Morning and 2 Tab By Mouth Each Evening  6)  Aspirin Low Dose 81 Mg Tabs (Aspirin) .... Take 1 Tablet By Mouth Once A Day 7)  Diazepam 5 Mg Tabs (Diazepam) .... 1/2 By Mouth Once Daily As Needed 8)  Promethazine Hcl 25 Mg Tabs (Promethazine Hcl) .... 1/2 To 1 By Mouth As Needed 9)  Clonidine Hcl 0.2 Mg Tabs (Clonidine Hcl) .Marland Kitchen.. 1 Tab By Mouth As Needed When Bp Is Over 140 10)  Qvar 80 Mcg/act Aers (Beclomethasone Dipropionate) .... 2 Puffs Twice Daily 11)  Losartan Potassium 100 Mg Tabs (Losartan Potassium) .... Take 1 Tablet By Mouth Once A Day 12)  Alphagan P 0.1 % Soln (Brimonidine Tartrate) .Marland Kitchen.. 1 Gtt Left Eye Two Times A Day 13)  Valacyclovir Hcl 500 Mg Tabs (Valacyclovir Hcl) .... As  Needed 14)  Foradil Aerolizer 12 Mcg Caps (Formoterol Fumarate) .Marland Kitchen.. 1 Puff Two Times A Day 15)  Amlodipine Besylate 5 Mg Tabs (Amlodipine Besylate) .... Take 1 Tablet By Mouth Once A Day 16)  Vitamin D 1000 Unit Tabs (Cholecalciferol) .... Take 1 Tablet By Mouth Once A Day 17)  Multivitamins   Tabs (Multiple Vitamin) .... Take 1 Tablet By Mouth Once A Day 18)  Saw Palmetto 500 Mg Caps (Saw Palmetto (Serenoa Repens)) .... Take 1 Capsule By Mouth Once A Day 19)  Zinc 100 Mg Tabs (Zinc) .... Take 1 Tablet By Mouth Once A Day 20)  Vitamin E 400 Unit Caps (Vitamin E) .... Take 1 Capsule By Mouth Once A Day 21)  Glimepiride 2 Mg Tabs (Glimepiride) .... As Needed For High Blood Sugar  Allergies (verified): 1)  ! * Avelox 2)  ! * "quinolone Family" No Avelox, Levaquin, Cipro , Factive 3)  Penicillin 4)  Sulfa  Past History:  Past medical, surgical, family and social histories (including risk factors) reviewed, and no changes noted (except as noted below).  Past Medical History: Reviewed history from 08/27/2008 and no changes required. Current Problems:  FATTY LIVER DISEASE (ICD-571.8) CAROTID ARTERY STENOSIS, BILATERAL (ICD-433.10) DIVERTICULOSIS, COLON (ICD-562.10) GASTRITIS (ICD-535.50) HIATAL HERNIA WITH REFLUX (ICD-553.3) DYSPNEA (ICD-786.05) C O P D (ICD-496) BASAL CELL CARCINOMA, HX OF (ICD-V10.83) BARRETTS ESOPHAGUS (ICD-530.85) ARTHRITIS (ICD-716.90) TRANSIENT ISCHEMIC ATTACKS, HX OF (ICD-V12.50) DM (ICD-250.00) HYPERLIPIDEMIA (ICD-272.4) ACID REFLUX DISEASE (ICD-530.81) GLAUCOMA (ICD-365.9) HYPERTENSION (ICD-401.9)  Past Surgical History: Reviewed history from 08/28/2008 and no changes required. Esophagectomy with stomach pull through 2007    -barretts esophagitis  pre cancer Eye surgeries ventral hernia repair 2008    -due to prior abdominal incision ortho knee both kness carotid arteries  Past Pulmonary History:  Pulmonary History: Pulmonary Function Test   Date: 06/10/2008 Height (in.): 70 Gender: Male  Pre-Spirometry  FVC     Value: 3.12 L/min   Pred: 4.37 L/min     % Pred: 71 % FEV1     Value: 1.78 L     Pred: 2.84 L     % Pred: 61 % FEV1/FVC   Value: 55 %     Pred: 67 %     FEF 25-75   Value: 0.51 L/min   Pred: 22.43 L/min     % Pred: 21 %  Post-Spirometry  FVC     Value: 3.06 L/min   Pred: 4.37 L/min     % Pred: 70 % FEV1     Value: 1.73 L     Pred: 2.84 L     % Pred: 61 % FEV1/FVC   Value: 56 %     Pred: 67 %  FEF 25-75   Value: 0.49 L/min   Pred: 2.43 L/min     % Pred: 20 %  Lung Volumes  TLC     Value: 4.73 L   % Pred: 71 % RV     Value: 1.61 L   % Pred: 59 % DLCO     Value: 17.9 %   % Pred: 87 % DLCO/VA   Value: 3.68 %   % Pred: 115 %  Comments:  Severe obstructive defect,  normal DLCO,  mild restriction  6 min walk test No desaturation Interpretation:  Number of laps  8 X 48 meters =   384 meters+ final partial lap: 40 meters =    424 meters   Total distance walked in six minutes: 424 meters  Tech ID: Tivis Ringer (Jun 10, 2008 12:01 PM) Tech Comments pt completed test w/ 0 rest  breaks and 0 complaints  Family History: Reviewed history from 05/20/2008 and no changes required. Family History Colon Cancer mother, MGF Family History Lung Cancer  mother  Family History MI/Heart Attack mother , 2 brothers Esophageal/stomach cancer :  father   Social History: Reviewed history from 08/28/2008 and no changes required. Patient states former smoker  Quit in 1980's.  1 ppd x 40 yrs  retired Armed forces operational officer Occupation: retired Alcohol Use - no Illicit Drug Use - no  Review of Systems       The patient complains of shortness of breath with activity and productive cough.  The patient denies shortness of breath at rest, non-productive cough, coughing up blood, chest pain, irregular heartbeats, acid heartburn, indigestion, loss of appetite, weight change, abdominal pain, difficulty swallowing, sore throat,  tooth/dental problems, headaches, nasal congestion/difficulty breathing through nose, sneezing, itching, ear ache, anxiety, depression, hand/feet swelling, joint stiffness or pain, rash, change in color of mucus, and fever.    Vital Signs:  Patient profile:   77 year old male Height:      69 inches Weight:      178.25 pounds BMI:     26.42 O2 Sat:      97 % on Room air Temp:     98.4 degrees F oral Pulse rate:   82 / minute BP sitting:   132 / 86  (left arm) Cuff size:   regular  Vitals Entered By: Gweneth Dimitri RN (October 28, 2009 1:59 PM)  O2 Flow:  Room air CC: Acute Visit.  Pt states he had a lot of chest congestion and prod cough with green mucus x 2 days ago but sxs are better now.  Also having "some" increased SOB and "a little" wheezing.  Denies f/c/s. Comments Medications reviewed with patient Daytime contact number verified with patient. Gweneth Dimitri RN  October 28, 2009 1:59 PM    Physical Exam  Additional Exam:  Gen: Pleasant, well-nourished, in no distress,  normal affect ENT: No lesions,  mouth clear,  oropharynx clear, no postnasal drip Neck: No JVD, no TMG, no carotid bruits Lungs: No use of accessory muscles, no dullness to percussion, distant BS,  scattered rhonchi Cardiovascular: RRR, heart sounds normal, no murmur or gallops, no peripheral edema Abdomen: soft and NT, no HSM,  BS normal Musculoskeletal: No deformities, no cyanosis or clubbing Neuro: alert, non focal Skin: Warm, no lesions or rashes    Impression & Recommendations:  Problem # 1:  ACUTE BRONCHITIS (ICD-466.0) Assessment Deteriorated acute tracheobronchitis with flare plan flu vaccine biaxin x 7days No change in inhaled medications.  Maintain treatment program as currently prescribed.  His updated medication list for this problem includes:    Spiriva Handihaler 18 Mcg Caps (Tiotropium bromide monohydrate) ..... Inhale contents of 1 capsule once a day    Qvar 80 Mcg/act Aers  (Beclomethasone dipropionate) .Marland Kitchen... 2 puffs twice daily    Foradil Aerolizer 12 Mcg Caps (Formoterol fumarate) .Marland Kitchen... 1 puff two times a day    Proair Hfa 108 (90 Base) Mcg/act Aers (Albuterol sulfate) .Marland Kitchen... 1-2 puffs every 4-6 hours as needed    Clarithromycin 500 Mg Tabs (Clarithromycin) ..... One by mouth two times a day  Orders: Est. Patient Level IV (16109)  Medications Added to Medication List This Visit: 1)  Losartan Potassium 100 Mg Tabs (Losartan potassium) .... Take 1 tablet by mouth once a day 2)  Glimepiride 2 Mg Tabs (Glimepiride) .... As needed for high blood sugar 3)  Clarithromycin 500 Mg Tabs (Clarithromycin) .... One by mouth two times a day  Complete Medication List: 1)  Spiriva Handihaler 18 Mcg Caps (Tiotropium bromide monohydrate) .... Inhale contents of 1 capsule once a day 2)  Simvastatin 80 Mg Tabs (Simvastatin) .... Take 1/2 tab by mouth at bedtime 3)  Omeprazole 20 Mg Tbec (Omeprazole) .... Take 1  tab  by mouth daily 4)  Metformin Hcl 500 Mg Tabs (Metformin hcl) .... Take 2 tabs by mouth each morning and 2 tab by mouth each evening 5)  Aspirin Low Dose 81 Mg Tabs (Aspirin) .... Take 1 tablet by mouth once a day 6)  Qvar 80 Mcg/act Aers (Beclomethasone dipropionate) .... 2 puffs twice daily 7)  Losartan Potassium 100 Mg Tabs (Losartan potassium) .... Take 1 tablet by mouth once a day 8)  Alphagan P 0.1 % Soln (Brimonidine tartrate) .Marland Kitchen.. 1 gtt left eye two times a day 9)  Foradil Aerolizer 12 Mcg Caps (Formoterol fumarate) .Marland Kitchen.. 1 puff two times a day 10)  Amlodipine Besylate 5 Mg Tabs (Amlodipine besylate) .... Take 1 tablet by mouth once a day 11)  Vitamin D 1000 Unit Tabs (Cholecalciferol) .... Take 1 tablet by mouth once a day 12)  Multivitamins Tabs (Multiple vitamin) .... Take 1 tablet by mouth once a day 13)  Saw Palmetto 500 Mg Caps (Saw palmetto (serenoa repens)) .... Take 1 capsule by mouth once a day 14)  Zinc 100 Mg Tabs (Zinc) .... Take 1 tablet by  mouth once a day 15)  Vitamin E 400 Unit Caps (Vitamin e) .... Take 1 capsule by mouth once a day 16)  Glimepiride 2 Mg Tabs (Glimepiride) .... As needed for high blood sugar 17)  Proair Hfa 108 (90 Base) Mcg/act Aers (Albuterol sulfate) .Marland Kitchen.. 1-2 puffs every 4-6 hours as needed 18)  Valacyclovir Hcl 500 Mg Tabs (Valacyclovir hcl) .... As needed 19)  Clonidine Hcl 0.2 Mg Tabs (Clonidine hcl) .Marland Kitchen.. 1 tab by mouth as needed when bp is over 140 20)  Promethazine Hcl 25 Mg Tabs (Promethazine hcl) .... 1/2 to 1 by mouth as needed 21)  Diazepam 5 Mg Tabs (Diazepam) .... 1/2 by mouth once daily as needed 22)  Clarithromycin 500 Mg Tabs (Clarithromycin) .... One by mouth two times a day  Other Orders: Flu Vaccine 68yrs + MEDICARE PATIENTS (U0454) Administration Flu vaccine - MCR (U9811)  Patient Instructions: 1)  Flu vaccine today  2)  Biaxin 500mg  twice daily for 7days 3)  No other medication changes 4)  Return two months Prescriptions: CLARITHROMYCIN 500 MG TABS (CLARITHROMYCIN) One by mouth two times a  day  #14 x 0   Entered and Authorized by:   Storm Frisk MD   Signed by:   Storm Frisk MD on 10/28/2009   Method used:   [facility]        [facility] [address] [phone]* (retail)       [address]       [place], [place]  [address]       Ph: [phone] or [phone]       Fax: [phone]   RxID:   [id number]   Prevention & Chronic Care Immunizations   Influenza vaccine: Fluvax 3+  (10/28/2009)    Tetanus booster: 11/18/2002: Historical    Pneumococcal vaccine: Historical  (03/17/2009)    H. zoster vaccine: Not documented  Colorectal Screening   Hemoccult: Not documented    Colonoscopy: Location:  [facility].    (12/01/2004)  Other Screening   PSA: Not documented   Smoking status: quit > 6 months  (10/28/2009)  Diabetes Mellitus   HgbA1C: Not documented    Eye exam: Not documented    Foot exam: Not  documented   High risk foot: Not documented   Foot care education: Not documented    Urine microalbumin/creatinine ratio: Not documented  Lipids   Total Cholesterol: Not documented   LDL: Not documented   LDL Direct: Not documented   HDL: Not documented   Triglycerides: Not documented    SGOT (AST): Not documented   SGPT (ALT): Not documented   Alkaline phosphatase: Not documented   Total bilirubin: Not documented  Hypertension   Last Blood Pressure: 132 / 86  (10/28/2009)   Serum creatinine: Not documented   Serum potassium Not documented  Self-Management Support :    Diabetes self-management support: Not documented    Hypertension self-management support: Not documented    Lipid self-management support: Not documented    Nursing Instructions: Give Flu vaccine today                 Flu Vaccine Consent Questions     Do you have a history of severe allergic reactions to this vaccine? no    Any prior history of allergic reactions to egg and/or gelatin? no    Do you have a sensitivity to the preservative Thimersol? no    Do you have a past history of Guillan-Barre Syndrome? no    Do you currently have an acute febrile illness? no    Have you ever had a severe reaction to latex? no    Vaccine information given and explained to patient? yes    Are you currently pregnant? no    Lot Number:[id number]   Exp Date:07/24/2010   Site Given  Left Deltoid IMedflu Zackery Barefoot CMA  October 28, 2009 3:47 PM   Appended Document: Pulmonary OV fax ron roberts

## 2010-02-23 NOTE — Progress Notes (Signed)
Summary: cough/SOB - biaxin rx  Phone Note Call from Patient Call back at Home Phone [phone]   Caller: Patient Call For: wright Summary of Call: pt was seen last wk by dr Delford Field. pt says that he is still coughing and says he is still SOB. now down to 2 x day on prednisone. pt requests an abx be called in. [facility] [address] Initial call taken by: Tivis Ringer, CNA,  August 25, 2009 11:59 AM  Follow-up for Phone Call        patint saw TP 7.7.11 and given doxycycilne 100mg  two times a day x7days, then saw PEW 7.27.11 and given prednisone 10mg  taper- pt began taking 2 tabs daily today of taper.  called spoke with patient who states that his SOB, wheezing and prod cough with yellow mucus have never really improved since the ov w/ PEW.  pt is [facility] an [facility]x.  [facility] [address]  will send to doc of the day for recs.  ALLERGIES: quinolone family, pcn, sulfa Follow-up by: Boone Master CNA/MA,  August 25, 2009 12:39 PM  Additional Follow-up for Phone Call Additional follow up Details #1::        Per SN: pt is allergic to multiple antibiotics, what has worked well for him in the past?  per pt's chart, he has been given doxycycline by TP, zithromax in the hosp, and patient states he has taken biaxin in the past with no issues.  please advise, thanks! Boone Master CNA/MA  August 25, 2009 2:45 PM     Additional Follow-up for Phone Call Additional follow up Details #2::    per SN: biaxin 500mg  # 14, 1 by mouth two times a day.  called spoke with patient, advised of SN's recs as stated above.  pt verbalized his understanding.  will sign off and forward to PEW for review. Boone Master CNA/MA  August 25, 2009 5:21 PM      New/Updated Medications: BIAXIN 500 MG TABS (CLARITHROMYCIN) Take 1 tablet by mouth two times a day Prescriptions: BIAXIN 500 MG TABS (CLARITHROMYCIN) Take 1 tablet by mouth two times a day  #14 x 0   Entered by:   Boone Master CNA/MA  Authorized by:   Michele Mcalpine MD   Signed by:   Boone Master CNA/MA on 08/25/2009   Method used:   [facility]        [facility] [address] [phone]* (retail)       [address]       [place], [place]  [address]       Ph: [phone] or [phone]       Fax: [phone]   RxID:   [id number]   Appended Document: cough/SOB - biaxin rx this is ok with me

## 2010-02-23 NOTE — Progress Notes (Signed)
Summary: CXR  Phone Note Outgoing Call   Reason for Call: Discuss lab or test results Summary of Call: call the pt and tell him his CXR was normal Initial call taken by: Storm Frisk MD,  May 29, 2009 4:46 PM  Follow-up for Phone Call        [facility] Gweneth Dimitri RN  May 29, 2009 4:55 PM  ATC pt's home number.  Number did not ring - went directly to voicemail.  [facility] Gweneth Dimitri RN  Jun 01, 2009 8:41 AM   Additional Follow-up for Phone Call Additional follow up Details #1::        Informed pt's wife of cxr results. Mrs. Lacount advised pt was admitted to [facility] last night to rm #[id number]. Pt's potassium was 24 and BP was elevated. Attending MD advised pt to d/c Prednisone PW started pt on Friday.  Spouse wanted to know who to listen to, I advised her to listen to the attending since he has been admitted and I will inform PW of same and call with any changes. Zackery Barefoot CMA  Jun 01, 2009 9:51 AM   noted  I will round on the pt in the hospital in am 5/'10 pls let pts spouse know this pw Additional Follow-up by: Storm Frisk MD,  Jun 01, 2009 1:23 PM    Additional Follow-up for Phone Call Additional follow up Details #2::    Spoke with pt's spouse and advised that PW will see the pt on 5/10 am.   Follow-up by: Vernie Murders,  Jun 01, 2009 1:42 PM

## 2010-02-23 NOTE — Progress Notes (Signed)
Summary: prescriptions not at pharmacy  Phone Note From Pharmacy Call back at [phone]   [facility] [address] [phone]* Call For: Chad [name]  Summary of Call: States they didn't receive prescriptions for pt yesterday. Initial call taken by: Darletta Moll,  November 12, 2009 10:02 AM  Follow-up for Phone Call        [facility] [address] and was informed they never received rx we sent electronically yesterday.  therefore, gave verbal order for rx.  nothing further needed.  Aundra Millet Reynolds LPN  November 12, 2009 10:21 AM

## 2010-02-23 NOTE — Assessment & Plan Note (Signed)
Summary: Pulmonary OV   Copy to:  n/a Primary Provider/Referring Provider:  Henri Medal  CC:  1 month follow up.  Pt states breathing has improved since last OV.  Denies SOB, wheezing, chest tightness, and cough.  Pt states he did not start daliresp.Marland Kitchen  History of Present Illness:   77 year old man with COPD primary emphysematous component..   Prior esophagectomy 2007 and abn CXR since that surgery.    February 20, 2009 10:13 AM No cough,  Shortness of breath is stable.  No real chest pain.  Sl wheeze.  No edema in feet. Pt denies any significant sore throat, nasal congestion or excess secretions, fever, chills, sweats, unintended weight loss, pleurtic or exertional chest pain, orthopnea PND, or leg swelling Pt denies any increase in rescue therapy over baseline, denies waking up needing it or having any early am or nocturnal exacerbations of coughing/wheezing/or dyspnea.  May 29, 2009 1:41 PM More dyspnea and wheezing is noted and gotten worse over the past few months.  More cough and productive clear white mucous.  No real chest pain.  Notes more wheezing.  No real edema.    July 30, 2009--Presents for an acute office visit. Complains of prod cough with green mucus, wheezing, increased SOB x2weeks - He is  currently on 3rd day of 3tabs of 10mg  pred taper. Had left over steroid taper that he did not take in past. He is still coughing w/ thick green mucus. Wheezing is some better. Denies chest pain,  orthopnea, hemoptysis, fever, n/v/d, edema, headache.   August 19, 2009 2:05 PM Pt seen for exac and rx doxy and pred per NP on 07/30/09. The pt finished all meds and now is no better.  The  mucus now,however,  is clearer.  The pt is still dyspneic and worse with heat.  No real chest pain.  Notes some wheeze.  No hemoptysis, n/v, f/c/s.September 16, 2009 4:21 PM Pt is much improved Rodney Booze was not taken due to concerns for suicidal ideation off pred now biaxin also called in and this  helped  Preventive Screening-Counseling & Management  Alcohol-Tobacco     Smoking Status: quit > 6 months     Year Quit: 1990     Pack years: 40 yrs x1 ppd  Current Medications (verified): 1)  Spiriva Handihaler 18 Mcg Caps (Tiotropium Bromide Monohydrate) .... Inhale Contents of 1 Capsule Once A Day 2)  Proair Hfa 108 (90 Base) Mcg/act  Aers (Albuterol Sulfate) .Marland Kitchen.. 1-2 Puffs Every 4-6 Hours As Needed 3)  Simvastatin 80 Mg Tabs (Simvastatin) .... Take 1/2 Tab By Mouth At Bedtime 4)  Omeprazole 20 Mg Tbec (Omeprazole) .... Take 1  Tab  By Mouth Daily 5)  Metformin Hcl 500 Mg Tabs (Metformin Hcl) .... Take 2 Tabs By Mouth Each Morning and 2 Tab By Mouth Each Evening 6)  Aspirin Low Dose 81 Mg Tabs (Aspirin) .... Take 1 Tablet By Mouth Once A Day 7)  Diazepam 5 Mg Tabs (Diazepam) .... 1/2 By Mouth Once Daily As Needed 8)  Promethazine Hcl 25 Mg Tabs (Promethazine Hcl) .... 1/2 To 1 By Mouth As Needed 9)  Clonidine Hcl 0.2 Mg Tabs (Clonidine Hcl) .Marland Kitchen.. 1 Tab By Mouth As Needed When Bp Is Over 140 10)  Qvar 80 Mcg/act Aers (Beclomethasone Dipropionate) .... 2 Puffs Twice Daily 11)  Losartan Potassium-Hctz 100-12.5 Mg Tabs (Losartan Potassium-Hctz) .... Once Daily 12)  Alphagan P 0.1 % Soln (Brimonidine Tartrate) .Marland Kitchen.. 1 Gtt Left  Eye Two Times A Day 13)  Valacyclovir Hcl 500 Mg Tabs (Valacyclovir Hcl) .... As Needed 14)  Foradil Aerolizer 12 Mcg Caps (Formoterol Fumarate) .Marland Kitchen.. 1 Puff Two Times A Day 15)  Amlodipine Besylate 5 Mg Tabs (Amlodipine Besylate) .... Take 1 Tablet By Mouth Once A Day 16)  Vitamin D 1000 Unit Tabs (Cholecalciferol) .... Take 1 Tablet By Mouth Once A Day 17)  Multivitamins   Tabs (Multiple Vitamin) .... Take 1 Tablet By Mouth Once A Day 18)  Saw Palmetto 500 Mg Caps (Saw Palmetto (Serenoa Repens)) .... Take 1 Capsule By Mouth Once A Day 19)  Zinc 100 Mg Tabs (Zinc) .... Take 1 Tablet By Mouth Once A Day 20)  Vitamin E 400 Unit Caps (Vitamin E) .... Take 1 Capsule By  Mouth Once A Day 21)  Glimepiride 2 Mg Tabs (Glimepiride) .... As Needed For High Bs  Allergies (verified): 1)  ! * Avelox 2)  ! * "quinolone Family" No Avelox, Levaquin, Cipro , Factive 3)  Penicillin 4)  Sulfa  Past History:  Past medical, surgical, family and social histories (including risk factors) reviewed, and no changes noted (except as noted below).  Past Medical History: Reviewed history from 08/27/2008 and no changes required. Current Problems:  FATTY LIVER DISEASE (ICD-571.8) CAROTID ARTERY STENOSIS, BILATERAL (ICD-433.10) DIVERTICULOSIS, COLON (ICD-562.10) GASTRITIS (ICD-535.50) HIATAL HERNIA WITH REFLUX (ICD-553.3) DYSPNEA (ICD-786.05) C O P D (ICD-496) BASAL CELL CARCINOMA, HX OF (ICD-V10.83) BARRETTS ESOPHAGUS (ICD-530.85) ARTHRITIS (ICD-716.90) TRANSIENT ISCHEMIC ATTACKS, HX OF (ICD-V12.50) DM (ICD-250.00) HYPERLIPIDEMIA (ICD-272.4) ACID REFLUX DISEASE (ICD-530.81) GLAUCOMA (ICD-365.9) HYPERTENSION (ICD-401.9)  Past Surgical History: Reviewed history from 08/28/2008 and no changes required. Esophagectomy with stomach pull through 2007    -barretts esophagitis  pre cancer Eye surgeries ventral hernia repair 2008    -due to prior abdominal incision ortho knee both kness carotid arteries  Past Pulmonary History:  Pulmonary History: Pulmonary Function Test  Date: 06/10/2008 Height (in.): 70 Gender: Male  Pre-Spirometry  FVC     Value: 3.12 L/min   Pred: 4.37 L/min     % Pred: 71 % FEV1     Value: 1.78 L     Pred: 2.84 L     % Pred: 61 % FEV1/FVC   Value: 55 %     Pred: 67 %     FEF 25-75   Value: 0.51 L/min   Pred: 22.43 L/min     % Pred: 21 %  Post-Spirometry  FVC     Value: 3.06 L/min   Pred: 4.37 L/min     % Pred: 70 % FEV1     Value: 1.73 L     Pred: 2.84 L     % Pred: 61 % FEV1/FVC   Value: 56 %     Pred: 67 %     FEF 25-75   Value: 0.49 L/min   Pred: 2.43 L/min     % Pred: 20 %  Lung Volumes  TLC     Value: 4.73 L   % Pred: 71 % RV      Value: 1.61 L   % Pred: 59 % DLCO     Value: 17.9 %   % Pred: 87 % DLCO/VA   Value: 3.68 %   % Pred: 115 %  Comments:  Severe obstructive defect,  normal DLCO,  mild restriction  6 min walk test No desaturation Interpretation:  Number of laps  8 X 48 meters =   384 meters+ final partial lap:  40 meters =    424 meters   Total distance walked in six minutes: 424 meters  Tech ID: Tivis Ringer (Jun 10, 2008 12:01 PM) Tech Comments pt completed test w/ 0 rest  breaks and 0 complaints  Family History: Reviewed history from 05/20/2008 and no changes required. Family History Colon Cancer mother, MGF Family History Lung Cancer  mother  Family History MI/Heart Attack mother , 2 brothers Esophageal/stomach cancer :  father   Social History: Reviewed history from 08/28/2008 and no changes required. Patient states former smoker.  retired Armed forces operational officer Occupation: retired Alcohol Use - no Illicit Drug Use - no  Review of Systems       The patient complains of shortness of breath with activity.  The patient denies shortness of breath at rest, productive cough, non-productive cough, coughing up blood, chest pain, irregular heartbeats, acid heartburn, indigestion, loss of appetite, weight change, abdominal pain, difficulty swallowing, sore throat, tooth/dental problems, headaches, nasal congestion/difficulty breathing through nose, sneezing, itching, ear ache, anxiety, depression, hand/feet swelling, joint stiffness or pain, rash, change in color of mucus, and fever.    Vital Signs:  Patient profile:   77 year old male Height:      69 inches Weight:      179.25 pounds BMI:     26.57 O2 Sat:      94 % on Room air Temp:     98.3 degrees F oral Pulse rate:   84 / minute BP sitting:   140 / 82  (right arm) Cuff size:   regular  Vitals Entered By: Gweneth Dimitri RN (September 16, 2009 3:55 PM)  O2 Flow:  Room air CC: 1 month follow up.  Pt states breathing has improved since last OV.   Denies SOB, wheezing, chest tightness, cough.  Pt states he did not start daliresp. Comments Medications reviewed with patient Daytime contact number verified with patient. Gweneth Dimitri RN  September 16, 2009 3:57 PM    Physical Exam  Additional Exam:  Gen: Pleasant, well-nourished, in no distress,  normal affect ENT: No lesions,  mouth clear,  oropharynx clear, no postnasal drip Neck: No JVD, no TMG, no carotid bruits Lungs: No use of accessory muscles, no dullness to percussion, distant BS,  no  wheeze Cardiovascular: RRR, heart sounds normal, no murmur or gallops, no peripheral edema Abdomen: soft and NT, no HSM,  BS normal Musculoskeletal: No deformities, no cyanosis or clubbing Neuro: alert, non focal Skin: Warm, no lesions or rashes    Impression & Recommendations:  Problem # 1:  C O P D (ICD-496) Assessment Improved  copd with AB flare  improved plan No change in inhaled medications.   Maintain treatment program as currently prescribed. no further Daliresp  Medications Added to Medication List This Visit: 1)  Glimepiride 2 Mg Tabs (Glimepiride) .... As needed for high bs  Complete Medication List: 1)  Spiriva Handihaler 18 Mcg Caps (Tiotropium bromide monohydrate) .... Inhale contents of 1 capsule once a day 2)  Proair Hfa 108 (90 Base) Mcg/act Aers (Albuterol sulfate) .Marland Kitchen.. 1-2 puffs every 4-6 hours as needed 3)  Simvastatin 80 Mg Tabs (Simvastatin) .... Take 1/2 tab by mouth at bedtime 4)  Omeprazole 20 Mg Tbec (Omeprazole) .... Take 1  tab  by mouth daily 5)  Metformin Hcl 500 Mg Tabs (Metformin hcl) .... Take 2 tabs by mouth each morning and 2 tab by mouth each evening 6)  Aspirin Low Dose 81 Mg Tabs (Aspirin) .... Take  1 tablet by mouth once a day 7)  Diazepam 5 Mg Tabs (Diazepam) .... 1/2 by mouth once daily as needed 8)  Promethazine Hcl 25 Mg Tabs (Promethazine hcl) .... 1/2 to 1 by mouth as needed 9)  Clonidine Hcl 0.2 Mg Tabs (Clonidine hcl) .Marland Kitchen.. 1 tab by  mouth as needed when bp is over 140 10)  Qvar 80 Mcg/act Aers (Beclomethasone dipropionate) .... 2 puffs twice daily 11)  Losartan Potassium-hctz 100-12.5 Mg Tabs (Losartan potassium-hctz) .... Once daily 12)  Alphagan P 0.1 % Soln (Brimonidine tartrate) .Marland Kitchen.. 1 gtt left eye two times a day 13)  Valacyclovir Hcl 500 Mg Tabs (Valacyclovir hcl) .... As needed 14)  Foradil Aerolizer 12 Mcg Caps (Formoterol fumarate) .Marland Kitchen.. 1 puff two times a day 15)  Amlodipine Besylate 5 Mg Tabs (Amlodipine besylate) .... Take 1 tablet by mouth once a day 16)  Vitamin D 1000 Unit Tabs (Cholecalciferol) .... Take 1 tablet by mouth once a day 17)  Multivitamins Tabs (Multiple vitamin) .... Take 1 tablet by mouth once a day 18)  Saw Palmetto 500 Mg Caps (Saw palmetto (serenoa repens)) .... Take 1 capsule by mouth once a day 19)  Zinc 100 Mg Tabs (Zinc) .... Take 1 tablet by mouth once a day 20)  Vitamin E 400 Unit Caps (Vitamin e) .... Take 1 capsule by mouth once a day 21)  Glimepiride 2 Mg Tabs (Glimepiride) .... As needed for high bs  Other Orders: Est. Patient Level III (27253)  Patient Instructions: 1)  No change in medications 2)  Return in     3     months  Appended Document: Pulmonary OV fax ron roberts

## 2010-02-25 NOTE — Assessment & Plan Note (Signed)
Summary: Pulmonary OV   Copy to:  n/a Primary Provider/Referring Provider:  Henri Medal  CC:  2 month follow up.  Pt states breathing is doing well overall.  Denies SOB, wheezing, chest tightness, and cough.  No complaints..  History of Present Illness:   76   year old man with COPD primary emphysematous component..   Prior esophagectomy 2007 and abn CXR since that surgery.    January 27, 2010 3:12 PM Since last ov seems ok.  No new issues. No more flare ups since November 2011. No more flare ups since first of 11/11.  Pt is doing better on qvar and foradil and spiriva. Pt denies any significant sore throat, nasal congestion or excess secretions, fever, chills, sweats, unintended weight loss, pleurtic or exertional chest pain, orthopnea PND, or leg swelling Pt denies any increase in rescue therapy over baseline, denies waking up needing it or having any early am or nocturnal exacerbations of coughing/wheezing/or dyspnea.    January 27, 2010 3:12 PM Since last ov seems ok.  went deer hunting and [place] and did well No more flare ups since first of 11/11. doing better on qvar and foradil and spiriva   Preventive Screening-Counseling & Management  Alcohol-Tobacco     Smoking Status: quit > 6 months     Year Quit: 1990     Pack years: 40 yrs x1 ppd  Current Medications (verified): 1)  Spiriva Handihaler 18 Mcg Caps (Tiotropium Bromide Monohydrate) .... Inhale Contents of 1 Capsule Once A Day 2)  Simvastatin 80 Mg Tabs (Simvastatin) .... Take 1/2 Tab By Mouth At Bedtime 3)  Omeprazole 20 Mg Tbec (Omeprazole) .... Take 1  Tab  By Mouth Daily 4)  Metformin Hcl 500 Mg Tabs (Metformin Hcl) .... Take 2 Tabs By Mouth Each Morning and 2 Tab By Mouth Each Evening 5)  Aspirin Low Dose 81 Mg Tabs (Aspirin) .... Take 1 Tablet By Mouth Once A Day 6)  Qvar 80 Mcg/act Aers (Beclomethasone Dipropionate) .... 4  Puffs Twice Daily Until Sample Gone Then Resume 2 Puffs Twice  Daily 7)  Losartan Potassium 100 Mg Tabs (Losartan Potassium) .... Take 1 Tablet By Mouth Once A Day 8)  Alphagan P 0.1 % Soln (Brimonidine Tartrate) .Marland Kitchen.. 1 Gtt Left Eye Two Times A Day 9)  Foradil Aerolizer 12 Mcg Caps (Formoterol Fumarate) .Marland Kitchen.. 1 Puff Two Times A Day 10)  Amlodipine Besylate 5 Mg Tabs (Amlodipine Besylate) .... Take 1 Tablet By Mouth Once A Day 11)  Vitamin D 1000 Unit Tabs (Cholecalciferol) .... Take 1 Tablet By Mouth Once A Day 12)  Multivitamins   Tabs (Multiple Vitamin) .... Take 1 Tablet By Mouth Once A Day 13)  Saw Palmetto 500 Mg Caps (Saw Palmetto (Serenoa Repens)) .... Take 1 Capsule By Mouth Once A Day 14)  Zinc 100 Mg Tabs (Zinc) .... Take 1 Tablet By Mouth Once A Day 15)  Vitamin E 400 Unit Caps (Vitamin E) .... Take 1 Capsule By Mouth Once A Day 16)  Glimepiride 2 Mg Tabs (Glimepiride) .... As Needed For High Blood Sugar 17)  Proair Hfa 108 (90 Base) Mcg/act  Aers (Albuterol Sulfate) .Marland Kitchen.. 1-2 Puffs Every 4-6 Hours As Needed 18)  Valacyclovir Hcl 500 Mg Tabs (Valacyclovir Hcl) .... As Needed 19)  Clonidine Hcl 0.2 Mg Tabs (Clonidine Hcl) .Marland Kitchen.. 1 Tab By Mouth As Needed When Bp Is Over 140 20)  Promethazine Hcl 25 Mg Tabs (Promethazine Hcl) .... 1/2 To  1 By Mouth As Needed 21)  Diazepam 5 Mg Tabs (Diazepam) .... 1/2 By Mouth Once Daily As Needed  Allergies (verified): 1)  ! * Avelox 2)  ! * "quinolone Family" No Avelox, Levaquin, Cipro , Factive 3)  Penicillin 4)  Sulfa  Past History:  Past medical, surgical, family and social histories (including risk factors) reviewed, and no changes noted (except as noted below).  Past Medical History: Reviewed history from 08/27/2008 and no changes required. Current Problems:  FATTY LIVER DISEASE (ICD-571.8) CAROTID ARTERY STENOSIS, BILATERAL (ICD-433.10) DIVERTICULOSIS, COLON (ICD-562.10) GASTRITIS (ICD-535.50) HIATAL HERNIA WITH REFLUX (ICD-553.3) DYSPNEA (ICD-786.05) C O P D (ICD-496) BASAL CELL CARCINOMA, HX OF  (ICD-V10.83) BARRETTS ESOPHAGUS (ICD-530.85) ARTHRITIS (ICD-716.90) TRANSIENT ISCHEMIC ATTACKS, HX OF (ICD-V12.50) DM (ICD-250.00) HYPERLIPIDEMIA (ICD-272.4) ACID REFLUX DISEASE (ICD-530.81) GLAUCOMA (ICD-365.9) HYPERTENSION (ICD-401.9)  Past Surgical History: Reviewed history from 08/28/2008 and no changes required. Esophagectomy with stomach pull through 2007    -barretts esophagitis  pre cancer Eye surgeries ventral hernia repair 2008    -due to prior abdominal incision ortho knee both kness carotid arteries  Past Pulmonary History:  Pulmonary History: Pulmonary Function Test  Date: 06/10/2008 Height (in.): 70 Gender: Male  Pre-Spirometry  FVC     Value: 3.12 L/min   Pred: 4.37 L/min     % Pred: 71 % FEV1     Value: 1.78 L     Pred: 2.84 L     % Pred: 61 % FEV1/FVC   Value: 55 %     Pred: 67 %     FEF 25-75   Value: 0.51 L/min   Pred: 22.43 L/min     % Pred: 21 %  Post-Spirometry  FVC     Value: 3.06 L/min   Pred: 4.37 L/min     % Pred: 70 % FEV1     Value: 1.73 L     Pred: 2.84 L     % Pred: 61 % FEV1/FVC   Value: 56 %     Pred: 67 %     FEF 25-75   Value: 0.49 L/min   Pred: 2.43 L/min     % Pred: 20 %  Lung Volumes  TLC     Value: 4.73 L   % Pred: 71 % RV     Value: 1.61 L   % Pred: 59 % DLCO     Value: 17.9 %   % Pred: 87 % DLCO/VA   Value: 3.68 %   % Pred: 115 %  Comments:  Severe obstructive defect,  normal DLCO,  mild restriction  6 min walk test No desaturation Interpretation:  Number of laps  8 X 48 meters =   384 meters+ final partial lap: 40 meters =    424 meters   Total distance walked in six minutes: 424 meters  Tech ID: Tivis Ringer (Jun 10, 2008 12:01 PM) Tech Comments pt completed test w/ 0 rest  breaks and 0 complaints  Family History: Reviewed history from 05/20/2008 and no changes required. Family History Colon Cancer mother, MGF Family History Lung Cancer  mother  Family History MI/Heart Attack mother , 2  brothers Esophageal/stomach cancer :  father   Social History: Reviewed history from 10/28/2009 and no changes required. Patient states former smoker  Quit in 1980's.  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  The patient denies shortness of breath at rest, productive cough, non-productive cough, coughing up blood, chest pain, irregular heartbeats, acid heartburn, indigestion, loss of appetite, weight change, abdominal pain, difficulty swallowing, sore throat, tooth/dental problems, headaches, nasal congestion/difficulty breathing through nose, sneezing, itching, ear ache, anxiety, depression, hand/feet swelling, joint stiffness or pain, rash, change in color of mucus, and fever.    Vital Signs:  Patient profile:   77 year old male Height:      70 inches Weight:      183.50 pounds BMI:     26.42 O2 Sat:      96 % on Room air Temp:     97.7 degrees F oral Pulse rate:   85 / minute BP sitting:   146 / 90  (left arm) Cuff size:   regular  Vitals Entered By: Gweneth Dimitri RN (January 27, 2010 3:00 PM)  O2 Flow:  Room air CC: 2 month follow up.  Pt states breathing is doing well overall.  Denies SOB, wheezing, chest tightness, cough.  No complaints. Comments Medications reviewed with patient Daytime contact number verified with patient. Crystal Jones RN  January 27, 2010 3:00 PM    Physical Exam  Additional Exam:  Gen: Pleasant, well-nourished, in no distress,  normal affect ENT: No lesions,  mouth clear,  oropharynx clear, no postnasal drip Neck: No JVD, no TMG, no carotid bruits Lungs: No use of accessory muscles, no dullness to percussion, distant BS,   Cardiovascular: RRR, heart sounds normal, no murmur or gallops, no peripheral edema Abdomen: soft and NT, no HSM,  BS normal Musculoskeletal: No deformities, no cyanosis or  clubbing Neuro: alert, non focal Skin: Warm, no lesions or rashes    Impression & Recommendations:  Problem # 1:  C O P D (ICD-496) Assessment Improved copd with AB flare  improved  plan No change in inhaled medications.   Maintain treatment program as currently prescribed.  Complete Medication List: 1)  Spiriva Handihaler 18 Mcg Caps (Tiotropium bromide monohydrate) .... Inhale contents of 1 capsule once a day 2)  Simvastatin 80 Mg Tabs (Simvastatin) .... Take 1/2 tab by mouth at bedtime 3)  Omeprazole 20 Mg Tbec (Omeprazole) .... Take 1  tab  by mouth daily 4)  Metformin Hcl 500 Mg Tabs (Metformin hcl) .... Take 2 tabs by mouth each morning and 2 tab by mouth each evening 5)  Aspirin Low Dose 81 Mg Tabs (Aspirin) .... Take 1 tablet by mouth once a day 6)  Qvar 80 Mcg/act Aers (Beclomethasone dipropionate) .... 4  puffs twice daily until sample gone then resume 2 puffs twice daily 7)  Losartan Potassium 100 Mg Tabs (Losartan potassium) .... Take 1 tablet by mouth once a day 8)  Alphagan P 0.1 % Soln (Brimonidine tartrate) .Marland Kitchen.. 1 gtt left eye two times a day 9)  Foradil Aerolizer 12 Mcg Caps (Formoterol fumarate) .Marland Kitchen.. 1 puff two times a day 10)  Amlodipine Besylate 5 Mg Tabs (Amlodipine besylate) .... Take 1 tablet by mouth once a day 11)  Vitamin D 1000 Unit Tabs (Cholecalciferol) .... Take 1 tablet by mouth once a day 12)  Multivitamins Tabs (Multiple vitamin) .... Take 1 tablet by mouth once a day 13)  Saw Palmetto 500 Mg Caps (Saw palmetto (serenoa repens)) .... Take 1 capsule by mouth once a day 14)  Zinc 100 Mg Tabs (Zinc) .... Take 1 tablet by mouth once a day 15)  Vitamin  E 400 Unit Caps (Vitamin e) .... Take 1 capsule by mouth once a day 16)  Glimepiride 2 Mg Tabs (Glimepiride) .... As needed for high blood sugar 17)  Proair Hfa 108 (90 Base) Mcg/act Aers (Albuterol sulfate) .Marland Kitchen.. 1-2 puffs every 4-6 hours as needed 18)  Valacyclovir Hcl 500 Mg Tabs (Valacyclovir hcl) ....  As needed 19)  Clonidine Hcl 0.2 Mg Tabs (Clonidine hcl) .Marland Kitchen.. 1 tab by mouth as needed when bp is over 140 20)  Promethazine Hcl 25 Mg Tabs (Promethazine hcl) .... 1/2 to 1 by mouth as needed 21)  Diazepam 5 Mg Tabs (Diazepam) .... 1/2 by mouth once daily as needed  Other Orders: Est. Patient Level III (16109)  Patient Instructions: 1)  No change in medications 2)  Return in    3      months     Appended Document: Pulmonary OV fax ron roberts

## 2010-04-09 ENCOUNTER — Ambulatory Visit (HOSPITAL_BASED_OUTPATIENT_CLINIC_OR_DEPARTMENT_OTHER)
Admission: RE | Admit: 2010-04-09 | Discharge: 2010-04-09 | Disposition: A | Discharge status: Home / Self Care | Payer: Medicare Other | Source: Ambulatory Visit | Attending: Orthopedic Surgery | Admitting: Orthopedic Surgery

## 2010-04-09 ENCOUNTER — Other Ambulatory Visit: Payer: Self-pay | Admitting: Orthopedic Surgery

## 2010-04-09 DIAGNOSIS — M6740 Ganglion, unspecified site: Secondary | ICD-10-CM | POA: Insufficient documentation

## 2010-04-09 DIAGNOSIS — M19049 Primary osteoarthritis, unspecified hand: Secondary | ICD-10-CM | POA: Insufficient documentation

## 2010-04-13 LAB — TROPONIN I: Troponin I: 0.01 ng/mL (ref 0.00–0.06)

## 2010-04-13 LAB — COMPREHENSIVE METABOLIC PANEL
ALT: 21 U/L (ref 0–53)
AST: 24 U/L (ref 0–37)
Alkaline Phosphatase: 38 U/L — ABNORMAL LOW (ref 39–117)
CO2: 23 mEq/L (ref 19–32)
Chloride: 88 mEq/L — ABNORMAL LOW (ref 96–112)
GFR calc Af Amer: >60 mL/min (ref >60)
GFR calc non Af Amer: >60 mL/min (ref >60)
Glucose, Bld: 176 mg/dL — ABNORMAL HIGH (ref 70–99)
Sodium: 124 mEq/L — ABNORMAL LOW (ref 135–145)
Total Bilirubin: 0.6 mg/dL (ref 0.3–1.2)

## 2010-04-13 LAB — DIFFERENTIAL
Basophils Absolute: 0 10*3/uL (ref 0.0–0.1)
Basophils Relative: 0 % (ref 0–1)
Eosinophils Absolute: 0 10*3/uL (ref 0.0–0.7)
Eosinophils Absolute: 0.2 10*3/uL (ref 0.0–0.7)
Eosinophils Relative: 0 % (ref 0–5)
Eosinophils Relative: 2 % (ref 0–5)
Lymphocytes Relative: 38 % (ref 12–46)
Lymphs Abs: 2.7 10*3/uL (ref 0.7–4.0)
Monocytes Absolute: 0.8 10*3/uL (ref 0.1–1.0)
Monocytes Relative: 11 % (ref 3–12)
Neutrophils Relative %: 79 % — ABNORMAL HIGH (ref 43–77)

## 2010-04-13 LAB — CBC
HCT: 35.5 % — ABNORMAL LOW (ref 39.0–52.0)
HCT: 36.9 % — ABNORMAL LOW (ref 39.0–52.0)
Hemoglobin: 12.2 g/dL — ABNORMAL LOW (ref 13.0–17.0)
MCHC: 34.5 g/dL (ref 30.0–36.0)
MCV: 89.1 fL (ref 78.0–100.0)
MCV: 89.7 fL (ref 78.0–100.0)
Platelets: 143 10*3/uL — ABNORMAL LOW (ref 150–400)
RBC: 4.15 MIL/uL — ABNORMAL LOW (ref 4.22–5.81)
WBC: 11.2 10*3/uL — ABNORMAL HIGH (ref 4.0–10.5)
WBC: 7.2 10*3/uL (ref 4.0–10.5)

## 2010-04-13 LAB — GLUCOSE, CAPILLARY
Glucose-Capillary: 140 mg/dL — ABNORMAL HIGH (ref 70–99)
Glucose-Capillary: 149 mg/dL — ABNORMAL HIGH (ref 70–99)
Glucose-Capillary: 152 mg/dL — ABNORMAL HIGH (ref 70–99)

## 2010-04-13 LAB — BASIC METABOLIC PANEL
BUN: 15 mg/dL (ref 6–23)
BUN: 15 mg/dL (ref 6–23)
Chloride: 92 mEq/L — ABNORMAL LOW (ref 96–112)
Creatinine, Ser: 0.92 mg/dL (ref 0.4–1.5)
GFR calc non Af Amer: >60 mL/min (ref >60)
Potassium: 4.4 mEq/L (ref 3.5–5.1)
Sodium: 131 mEq/L — ABNORMAL LOW (ref 135–145)

## 2010-04-13 LAB — MAGNESIUM: Magnesium: 1.6 mg/dL (ref 1.5–2.5)

## 2010-04-13 LAB — CK TOTAL AND CKMB (NOT AT ARMC): Relative Index: INVALID (ref 0.0–2.5)

## 2010-04-13 LAB — SODIUM, URINE, RANDOM: Sodium, Ur: 23 mEq/L

## 2010-04-13 LAB — TSH: TSH: 0.469 u[IU]/mL (ref 0.350–4.500)

## 2010-04-13 NOTE — Op Note (Signed)
NAME:  Chad [name] NO.:  [id number]  MEDICAL RECORD NO.:  [id number]            PATIENT TYPE:  LOCATION:                                 FACILITY:  PHYSICIAN:  Chad Fitch. Caydyn Sprung, M.D.      DATE OF BIRTH:  DATE OF PROCEDURE:  04/09/2010 DATE OF DISCHARGE:                              OPERATIVE REPORT   PREOPERATIVE DIAGNOSIS:  Enlarging mucoid cyst, right thumb interphalangeal joint dorsal radial aspect with discomfort.  POSTOPERATIVE DIAGNOSIS:  4+ arthritis of interphalangeal joint with large mucoid cyst, dorsal radial nail fold and mass adjacent to radial collateral ligament consistent with possible neuroma.  The mass was biopsied and placed in formalin for pathologic evaluation.  SURGEON:  Chad Fitch. Annie Roseboom, MD  INDICATIONS:  Chad [name] is a 77 year old gentleman well-acquainted with our practice.  We have cared for Chad [name] and his wife for many years.  He developed a large mucoid cyst presenting at the dorsal radial aspect of his right thumb IP joint.  He has a minor radial deviation deformity of the IP joint and Hiberden nodes evidencing significant osteoarthrosis.  He requested that we remove the cyst.  We advised him that we could not change the underlying arthritis but could debride the joint and diminish his likelihood of developing further mucoid cyst with joint debridement.  After informed consent, he is brought to the operating room at this time.  PROCEDURE IN DETAIL:  Chad [name] was brought to room 1 of the [facility] and placed in supine position upon the operating table.  Following routine informed consent, the right thumb and hand were prepped with Betadine followed by infiltration of 3.5 mL of 2% lidocaine at metacarpal head level to obtain a digital block.  After 10 minutes, excellent anesthesia was achieved.  The right hand and thumb were then prepped with Betadine soap and solution and sterilely draped.   Following exsanguination of right thumb with a gauze wrap, a 1/2-inch Penrose drain was placed in the proximal phalangeal segment of the thumb as a digital tourniquet.  After routine surgical time-out, the procedure commenced confirming satisfactory anesthesia with forceps pinch.  We then proceeded to perform a curvilinear incision incorporating the proximal aspect of the cyst, exposing the dorsal radial aspect of the IP joint.  The cyst was circumferentially dissected, drained of its mucin content, followed back to the IP joint to a sinus tract and the entire membrane of the cyst size with a rongeur.  A capsulotomy was performed between the terminal extensor tendon slip of the radial collateral ligament. The marginal osteophytes on the proximal phalangeal head and base of the distal phalanx were removed with a micro rongeur.  A House curette was used to remove debris within the joint followed by synovectomy of the joint on its dorsal and radial aspects.  The joint was then irrigated with a 10 mL syringe and a 19-gauge dental needle repeatedly until effluent was clear.  I encountered a mass on the dorsal radial aspect of the thumb directly radial to the radial collateral ligament.  This was white, firm, and may have  been either a atypical Pacinian corpuscle or a neuroma.  This was circumferentially dissected back to its nerve that appeared to be innervating it and was transected and sent for pathologic evaluation in formalin.  This may have been the source of Chad [name]'s discomfort.  The wound was then inspected for bleeding points followed by repair of the skin with trauma sutures of 5-0 nylon.  There were no apparent complications.  For aftercare, Chad [name] is provided prescriptions for Vicodin 5 mg 1 p.o. q.4-6 h. p.r.n. pain, 20 tablets.  Also, doxycycline 100 mg p.o. q.12 h. x4 days as a prophylactic antibiotic.     Chad [name], M.D.     RVS/MEDQ  D:   04/09/2010  T:  04/10/2010  Job:  [id number]  Electronically Signed by Josephine Igo M.D. on 04/13/2010 02:21:26 PM

## 2010-04-26 LAB — CBC
HCT: 34.7 % — ABNORMAL LOW (ref 39.0–52.0)
HCT: 35.6 % — ABNORMAL LOW (ref 39.0–52.0)
HCT: 41.6 % (ref 39.0–52.0)
HCT: 51.8 % (ref 39.0–52.0)
Hemoglobin: 11.6 g/dL — ABNORMAL LOW (ref 13.0–17.0)
Hemoglobin: 17.3 g/dL — ABNORMAL HIGH (ref 13.0–17.0)
MCV: 91.3 fL (ref 78.0–100.0)
MCV: 91.6 fL (ref 78.0–100.0)
MCV: 91.8 fL (ref 78.0–100.0)
Platelets: 148 10*3/uL — ABNORMAL LOW (ref 150–400)
Platelets: 161 10*3/uL (ref 150–400)
Platelets: 164 10*3/uL (ref 150–400)
RBC: 3.8 MIL/uL — ABNORMAL LOW (ref 4.22–5.81)
RBC: 5.65 MIL/uL (ref 4.22–5.81)
RDW: 15.2 % (ref 11.5–15.5)
RDW: 15.3 % (ref 11.5–15.5)
WBC: 10.7 10*3/uL — ABNORMAL HIGH (ref 4.0–10.5)
WBC: 12.2 10*3/uL — ABNORMAL HIGH (ref 4.0–10.5)
WBC: 12.9 10*3/uL — ABNORMAL HIGH (ref 4.0–10.5)

## 2010-04-26 LAB — BASIC METABOLIC PANEL
BUN: 12 mg/dL (ref 6–23)
BUN: 13 mg/dL (ref 6–23)
BUN: 13 mg/dL (ref 6–23)
CO2: 23 mEq/L (ref 19–32)
Chloride: 102 mEq/L (ref 96–112)
Chloride: 106 mEq/L (ref 96–112)
Creatinine, Ser: 0.84 mg/dL (ref 0.4–1.5)
Creatinine, Ser: 0.88 mg/dL (ref 0.4–1.5)
GFR calc Af Amer: >60 mL/min (ref >60)
GFR calc non Af Amer: >60 mL/min (ref >60)
GFR calc non Af Amer: >60 mL/min (ref >60)
GFR calc non Af Amer: >60 mL/min (ref >60)
Glucose, Bld: 131 mg/dL — ABNORMAL HIGH (ref 70–99)
Glucose, Bld: 216 mg/dL — ABNORMAL HIGH (ref 70–99)
Potassium: 3.8 mEq/L (ref 3.5–5.1)
Potassium: 3.9 mEq/L (ref 3.5–5.1)
Potassium: 4.2 mEq/L (ref 3.5–5.1)
Sodium: 134 mEq/L — ABNORMAL LOW (ref 135–145)
Sodium: 137 mEq/L (ref 135–145)

## 2010-04-26 LAB — COMPREHENSIVE METABOLIC PANEL
Albumin: 3.6 g/dL (ref 3.5–5.2)
Alkaline Phosphatase: 57 U/L (ref 39–117)
BUN: 13 mg/dL (ref 6–23)
CO2: 18 mEq/L — ABNORMAL LOW (ref 19–32)
Chloride: 99 mEq/L (ref 96–112)
Creatinine, Ser: 1.23 mg/dL (ref 0.4–1.5)
GFR calc non Af Amer: 57 mL/min — ABNORMAL LOW (ref >60)
Glucose, Bld: 302 mg/dL — ABNORMAL HIGH (ref 70–99)
Potassium: 5.1 mEq/L (ref 3.5–5.1)
Total Bilirubin: 0.8 mg/dL (ref 0.3–1.2)

## 2010-04-26 LAB — BLOOD GAS, ARTERIAL
Bicarbonate: 19.8 mEq/L — ABNORMAL LOW (ref 20.0–24.0)
MECHVT: 550 mL
PEEP: 5 cmH2O
Patient temperature: 97.5
TCO2: 20.8 mmol/L (ref 0–100)
pCO2 arterial: 33.6 mmHg — ABNORMAL LOW (ref 35.0–45.0)
pH, Arterial: 7.384 (ref 7.350–7.450)

## 2010-04-26 LAB — GLUCOSE, CAPILLARY
Glucose-Capillary: 121 mg/dL — ABNORMAL HIGH (ref 70–99)
Glucose-Capillary: 134 mg/dL — ABNORMAL HIGH (ref 70–99)
Glucose-Capillary: 145 mg/dL — ABNORMAL HIGH (ref 70–99)
Glucose-Capillary: 161 mg/dL — ABNORMAL HIGH (ref 70–99)
Glucose-Capillary: 162 mg/dL — ABNORMAL HIGH (ref 70–99)
Glucose-Capillary: 163 mg/dL — ABNORMAL HIGH (ref 70–99)
Glucose-Capillary: 211 mg/dL — ABNORMAL HIGH (ref 70–99)

## 2010-04-26 LAB — URINALYSIS, ROUTINE W REFLEX MICROSCOPIC
Leukocytes, UA: NEGATIVE
Nitrite: NEGATIVE
Protein, ur: NEGATIVE mg/dL
Specific Gravity, Urine: 1.026 (ref 1.005–1.030)
Urobilinogen, UA: 0.2 mg/dL (ref 0.0–1.0)

## 2010-04-26 LAB — PROTIME-INR: Prothrombin Time: 13.6 seconds (ref 11.6–15.2)

## 2010-04-26 LAB — CULTURE, BLOOD (ROUTINE X 2)
Culture: NO GROWTH
Culture: NO GROWTH

## 2010-04-26 LAB — CARDIAC PANEL(CRET KIN+CKTOT+MB+TROPI)
CK, MB: 3.4 ng/mL (ref 0.3–4.0)
Relative Index: INVALID (ref 0.0–2.5)
Total CK: 42 U/L (ref 7–232)
Troponin I: 0.1 ng/mL — ABNORMAL HIGH (ref 0.00–0.06)

## 2010-04-26 LAB — POCT I-STAT 3, ART BLOOD GAS (G3+)
Acid-base deficit: 7 mmol/L — ABNORMAL HIGH (ref 0.0–2.0)
Bicarbonate: 23.1 mEq/L (ref 20.0–24.0)
O2 Saturation: 100 %
Patient temperature: 35.6
TCO2: 25 mmol/L (ref 0–100)
pCO2 arterial: 51.8 mmHg — ABNORMAL HIGH (ref 35.0–45.0)
pH, Arterial: 7.205 — ABNORMAL LOW (ref 7.350–7.450)
pH, Arterial: 7.244 — ABNORMAL LOW (ref 7.350–7.450)
pO2, Arterial: 46 mmHg — ABNORMAL LOW (ref 80.0–100.0)
pO2, Arterial: 480 mmHg — ABNORMAL HIGH (ref 80.0–100.0)

## 2010-04-26 LAB — LACTIC ACID, PLASMA: Lactic Acid, Venous: 5.5 mmol/L — ABNORMAL HIGH (ref 0.5–2.2)

## 2010-04-26 LAB — DIFFERENTIAL
Basophils Absolute: 0.1 10*3/uL (ref 0.0–0.1)
Basophils Relative: 0 % (ref 0–1)
Lymphocytes Relative: 32 % (ref 12–46)
Monocytes Absolute: 0.5 10*3/uL (ref 0.1–1.0)
Neutro Abs: 7.8 10*3/uL — ABNORMAL HIGH (ref 1.7–7.7)

## 2010-04-26 LAB — URINE MICROSCOPIC-ADD ON

## 2010-04-26 LAB — POCT CARDIAC MARKERS
CKMB, poc: 2.2 ng/mL (ref 1.0–8.0)
Myoglobin, poc: 106 ng/mL (ref 12–200)
Troponin i, poc: <0.05 ng/mL (ref 0.00–0.09)

## 2010-04-26 LAB — CULTURE, RESPIRATORY W GRAM STAIN

## 2010-04-26 LAB — ABO/RH: ABO/RH(D): O NEG

## 2010-04-26 LAB — TYPE AND SCREEN: ABO/RH(D): O NEG

## 2010-04-26 LAB — APTT: aPTT: 27 seconds (ref 24–37)

## 2010-04-26 LAB — MAGNESIUM: Magnesium: 2 mg/dL (ref 1.5–2.5)

## 2010-06-08 NOTE — Procedures (Signed)
CAROTID DUPLEX EXAM   INDICATION:  Followup of carotid artery disease.   HISTORY:  Diabetes:  Yes.  Cardiac:  No.  Hypertension:  Yes.  Smoking:  Quit.  Previous Surgery:  Bilateral CEA in 1998 by Dr. Arbie Cookey.  CV History:  TIA before CEAs.  Amaurosis Fugax:  No.  Paresthesias:  Yes.  Hemiparesis:  Yes.   Two days ago, lost control of right foot (numbness and loss of control).                                       RIGHT             LEFT  Brachial systolic pressure:         190               192  Brachial Doppler waveforms:         Triphasic.        Triphasic.  Vertebral direction of flow:        Not fully visualized.               Antegrade.  DUPLEX VELOCITIES (cm/sec)  CCA peak systolic                   46                66  ECA peak systolic                   60                69  ICA peak systolic                   51                84  ICA end diastolic                   12                12  PLAQUE MORPHOLOGY:                  Mixed.            Mixed.  PLAQUE AMOUNT:                      Mild.             Mild.  PLAQUE LOCATION:                    CCA/ICA.          CCA/ICA   IMPRESSION:  1. Bilateral internal carotid artery stenosis of 1% to 39%, status      post carotid endarterectomy.  2. Right vertebral artery flow was not fully visualized.   ___________________________________________  Larina Earthly, M.D.   AS/MEDQ  D:  09/13/2006  T:  09/14/2006  Job:  [id number]

## 2010-06-08 NOTE — Consult Note (Signed)
NEW PATIENT CONSULTATION   Chad [name], Chad [name]  DOB:  24-Dec-1929                                       09/13/2006  ZOXWR#:[id number]   The patient was in today for evaluation of recent left hemispheric  transient ischemic attack.   He is a very pleasant 77 year old well known to me from a prior stage of  bilateral carotid endarterectomies in 1998.  He has had no neurologic  deficits since his surgery in the last 10 years.  He earlier this week  had an episode where for 10-15 minutes he had clumsiness of his right  foot.  He denies specifically any speech difficulty, visual difficulty,  or right arm difficulty.  This completely resolved after several minutes  and he has had no recurrent symptoms.   He is on 81 mg of aspirin per day and has been.   He has had recent major medical difficulties with esophageal cancer with  resection, had what sounds like an anastomotic leak and hernia repair.  He denies any cardiac difficulties.   He is married with 2 children.  He does not smoke, having quit in 1990.  He does not drink alcohol on a regular basis.   PHYSICAL EXAMINATION:  General:  Well-developed, well-nourished, white  male appearing stated age of 77.  Vital signs:  Blood pressure is 183/89  left arm,  199/93 right arm.  Pulse 60, respirations 16.  His radial  pulses are 2 plus bilaterally.  Neck incision are well healed.  He does  have a recent left neck incision from his esophageal surgery.  He is  grossly intact neurologically.   He underwent carotid duplex evaluation in our office today and this  reveals widely patent endarterectomies bilaterally with no evidence of  recurrent stenosis.   I discussed this at length with the patient.  I explained that this  rules out extracranial cerebrovascular occlusive disease as a likely  cause of his TIA.  He has not had any sort of cardiac evaluation and I  explained it did not rule out intracranial issues.  He will  continue to  follow with Dr. Artis Flock.  I would not recommend invasive cerebral  arteriography with his 1 event.  We plan to see him again on an as  needed basis.  If he did have recurrent symptoms, I would recommend more  invasive evaluation at that time.   Larina Earthly, M.D.  Electronically Signed   TFE/MEDQ  D:  09/13/2006  T:  09/14/2006  Job:  [id number]   cc:   Quita Skye. Artis Flock, M.D.

## 2010-06-11 NOTE — Procedures (Signed)
[place]  Patient:    Chad [name], Chad [name]                     MRN: [id number]. Date: 10/19/99 Attending:  Georgena Spurling                           Procedure Report  PROCEDURE:  Esophageal manometry report.  #1 - Upper esophageal sphincter - there appears to be normal coordination between pharyngeal contraction and cricopharyngeal relaxation.  #2 - Lower esophageal sphincter.  Mean pressure is normal at 17.4 mmHg with normal relaxation and swallowing.  #3 - Motility pattern.  There are normally parastaltic waves of normal amplitude and duration throughout the length of esophagus to wet and dry swallows.  Mean amplitude of contractions approximately 70 mmHg.  ASSESSMENT:  This is a normal esophageal manometry examination. DD:  10/19/99 TD:  10/20/99 Job: [id number]

## 2010-07-20 ENCOUNTER — Encounter: Payer: Self-pay | Admitting: Adult Health

## 2010-07-21 ENCOUNTER — Encounter: Payer: Self-pay | Admitting: Adult Health

## 2010-07-21 ENCOUNTER — Ambulatory Visit (INDEPENDENT_AMBULATORY_CARE_PROVIDER_SITE_OTHER): Payer: Medicare Other | Admitting: Adult Health

## 2010-07-21 DIAGNOSIS — J449 Chronic obstructive pulmonary disease, unspecified: Secondary | ICD-10-CM

## 2010-07-21 MED ORDER — DOXYCYCLINE HYCLATE 100 MG PO TABS: 100.0000 mg | ORAL_TABLET | Freq: Two times a day (BID) | ORAL | Status: AC

## 2010-07-21 MED ORDER — PREDNISONE 10 MG PO TABS: ORAL_TABLET | ORAL | Status: AC

## 2010-07-21 NOTE — Progress Notes (Signed)
  Subjective:    Patient ID: Chad [name], male    DOB: [birth year], 77 y.o.   MRN: [id number]  HPI 77 year old man with COPD primary emphysematous component.. Prior esophagectomy 2007 and abn CXR since that surgery.    07/21/10 Acute OV  Pt presents for an acute office visit. Complains of wheezing, increased SOB, chest congestion x 3 days.  Cough is getting worse. High temps outside seem to be causing his breathing to be worse.  Mucus is thick hard to get up at times Worse in am and late night.  No chest pain or hemoptysis. No fever    Review of Systems Constitutional:   No  weight loss, night sweats,  Fevers, chills, fatigue, or  lassitude.  HEENT:   No headaches,  Difficulty swallowing,  Tooth/dental problems, or  Sore throat,                No sneezing, itching, ear ache, nasal congestion, post nasal drip,   CV:  No chest pain,  Orthopnea, PND, swelling in lower extremities, anasarca, dizziness, palpitations, syncope.   GI  No heartburn, indigestion, abdominal pain, nausea, vomiting, diarrhea, change in bowel habits, loss of appetite, bloody stools.   Resp:   No coughing up of blood.  No chest wall deformity  Skin: no rash or lesions.  GU: no dysuria, change in color of urine, no urgency or frequency.  No flank pain, no hematuria   MS:  No joint pain or swelling.  No decreased range of motion.   Marland Kitchen  Psych:  No change in mood or affect. No depression or anxiety.             Objective:   Physical Exam GEN: A/Ox3; pleasant , NAD, elderly   HEENT:  [place]/AT,  EACs-clear, TMs-wnl, NOSE-clear, THROAT-clear, no lesions, no postnasal drip or exudate noted.   NECK:  Supple w/ fair ROM; no JVD; normal carotid impulses w/o bruits; no thyromegaly or nodules palpated; no lymphadenopathy.  RESP  Coarse BS diminshed bs in bases no accessory muscle use, no dullness to percussion  CARD:  RRR, no m/r/g  , no peripheral edema, pulses intact, no cyanosis or clubbing.  GI:   Soft &  nt; nml bowel sounds; no organomegaly or masses detected.  Musco: Warm bil, no deformities or joint swelling noted.   Neuro: alert, no focal deficits noted.    Skin: Warm, no lesions or rashes         Assessment & Plan:

## 2010-07-21 NOTE — Patient Instructions (Addendum)
Doxycycline 100mg  Twice daily  For 7 days  Mucinex DM Twice daily As needed  Cough/congestion  Prednisone taper over next week  Fludis and rest  Avoid extreme heat outside Please contact office for sooner follow up if symptoms do not improve or worsen or seek emergency care  follow up Dr. Delford Field  In 4-6 weeks and As needed

## 2010-07-27 NOTE — Assessment & Plan Note (Signed)
Flare   Plan  Doxycycline 100mg  Twice daily  For 7 days  Mucinex DM Twice daily As needed  Cough/congestion  Prednisone taper over next week  Fludis and rest  Avoid extreme heat outside Please contact office for sooner follow up if symptoms do not improve or worsen or seek emergency care  follow up Dr. Delford Field  In 4-6 weeks and As needed

## 2010-07-28 ENCOUNTER — Other Ambulatory Visit: Payer: Self-pay | Admitting: Critical Care Medicine

## 2010-08-18 ENCOUNTER — Ambulatory Visit (INDEPENDENT_AMBULATORY_CARE_PROVIDER_SITE_OTHER): Payer: Medicare Other | Admitting: Critical Care Medicine

## 2010-08-18 ENCOUNTER — Encounter: Payer: Self-pay | Admitting: Critical Care Medicine

## 2010-08-18 VITALS — BP 130/50 | HR 77 | Temp 98.1°F | Ht 70.0 in | Wt 189.6 lb

## 2010-08-18 DIAGNOSIS — J449 Chronic obstructive pulmonary disease, unspecified: Secondary | ICD-10-CM

## 2010-08-18 MED ORDER — BECLOMETHASONE DIPROPIONATE 80 MCG/ACT IN AERS: 2.0000 | INHALATION_SPRAY | Freq: Two times a day (BID) | RESPIRATORY_TRACT | Status: DC

## 2010-08-18 NOTE — Patient Instructions (Addendum)
No change in medications. Return in   3 months 

## 2010-08-18 NOTE — Assessment & Plan Note (Signed)
Copd Golds III , recent exacerbation now improved Plan No change in inhaled or maintenance medications. Return in  4 mo

## 2010-08-18 NOTE — Progress Notes (Signed)
Subjective:    Patient ID: Chad [name], male    DOB: 04-03-1929, 77 y.o.   MRN: [id number]  HPI  77 year old man with COPD primary emphysematous component.. Prior esophagectomy 2007 and abn CXR since that surgery.    07/21/10 Acute OV  Pt presents for an acute office visit. Complains of wheezing, increased SOB, chest congestion x 3 days.  Cough is getting worse. High temps outside seem to be causing his breathing to be worse.  Mucus is thick hard to get up at times Worse in am and late night.  No chest pain or hemoptysis. No fever   08/18/2010 Pt seen by NP last ov and rx Doxycycline 100mg  Twice daily For 7 days  Mucinex DM Twice daily As needed Cough/congestion  Prednisone taper over next week  Now is better.   No cough, dyspnea is better.   No chest pain Pt denies any significant sore throat, nasal congestion or excess secretions, fever, chills, sweats, unintended weight loss, pleurtic or exertional chest pain, orthopnea PND, or leg swelling Pt denies any increase in rescue therapy over baseline, denies waking up needing it or having any early am or nocturnal exacerbations of coughing/wheezing/or dyspnea. Pt also denies any obvious fluctuation in symptoms with  weather or environmental change or other alleviating or aggravating factors  Past Medical History  Diagnosis Date  . Fatty liver disease, nonalcoholic   . Coronary artery stenosis   . Diverticulosis of colon   . Diaphragmatic hernia without mention of obstruction or gangrene   . Dyspnea   . COPD (chronic obstructive pulmonary disease)   . Personal history of other malignant neoplasm of skin   . Barrett's esophagus   . Arthritis   . TIA (transient ischemic attack)   . Diabetes mellitus   . Hyperlipidemia   . Acid reflux disease   . Glaucoma   . HTN (hypertension)      Family History  Problem Relation Age of Onset  . Colon cancer Mother   . Colon cancer Maternal Grandfather   . Lung cancer Mother   .  Heart attack Mother   . Heart attack Brother   . Heart attack Brother   . Stomach cancer Father   . Esophageal cancer Father      History   Social History  . Marital Status: Married    Spouse Name: N/A    Number of Children: N/A  . Years of Education: N/A   Occupational History  . retired Armed forces operational officer    Social History Main Topics  . Smoking status: Former Smoker -- 1.0 packs/day for 40 years    Types: Cigarettes    Quit date: 01/24/1978  . Smokeless tobacco: Never Used  . Alcohol Use: No  . Drug Use: No  . Sexually Active: Not on file   Other Topics Concern  . Not on file   Social History Narrative  . No narrative on file     Allergies  Allergen Reactions  . Moxifloxacin     REACTION: anaphylaxis  . Penicillins     REACTION: hives  . Sulfonamide Derivatives     REACTION: hives  . Timoptic     Causes severe chest congestion     Outpatient Prescriptions Prior to Visit  Medication Sig Dispense Refill  . albuterol (PROAIR HFA) 108 (90 BASE) MCG/ACT inhaler Inhale 1-2 puffs into the lungs every 6 (six) hours as needed.        Marland Kitchen amLODipine (NORVASC) 5 MG  tablet Take 5 mg by mouth daily.        Marland Kitchen aspirin 81 MG tablet Take 81 mg by mouth daily.        . brimonidine (ALPHAGAN P) 0.1 % SOLN Place 1 drop into the left eye 2 (two) times daily.        . Cholecalciferol (VITAMIN D) 1000 UNITS capsule Take 1,000 Units by mouth daily.        . Cinnamon 500 MG capsule Take 500 mg by mouth daily.        . cloNIDine (CATAPRES) 0.2 MG tablet 1 tab by mouth as needed when BP is over 140       . diazepam (VALIUM) 5 MG tablet 1/2 tab by mouth once as daily as needed       . formoterol (FORADIL) 12 MCG capsule for inhaler Place 12 mcg into inhaler and inhale 2 (two) times daily.        Marland Kitchen losartan (COZAAR) 100 MG tablet Take 100 mg by mouth daily.        . metFORMIN (GLUCOPHAGE) 500 MG tablet Take 1,000 mg by mouth 2 (two) times daily with a meal.        . milk thistle 175  MG tablet Take 175 mg by mouth daily.        . Multiple Vitamin (MULTIVITAMIN) capsule Take 1 capsule by mouth daily.        Marland Kitchen omeprazole (PRILOSEC) 20 MG capsule Take 40 mg by mouth daily.       . Potassium 99 MG TABS Take 1 tablet by mouth daily.        . promethazine (PHENERGAN) 25 MG tablet 1/2 - 1 once daily as needed      . saw palmetto 500 MG capsule Take 500 mg by mouth daily.        . simvastatin (ZOCOR) 80 MG tablet 1/2 tab my mouth once daily       . Tamsulosin HCl (FLOMAX) 0.4 MG CAPS Take 1 tablet by mouth daily.      Marland Kitchen tiotropium (SPIRIVA) 18 MCG inhalation capsule Place 18 mcg into inhaler and inhale daily.        . valACYclovir (VALTREX) 500 MG tablet Take 1,000 mg by mouth every 8 (eight) hours as needed.       . vitamin E 400 UNIT capsule Take 400 Units by mouth daily.        . Zinc 100 MG TABS Take 1 tablet by mouth daily.        Marland Kitchen QVAR 80 MCG/ACT inhaler USE 2 PUFFS TWICE DAILY  1 Inhaler  6     Review of Systems  Constitutional:   No  weight loss, night sweats,  Fevers, chills, fatigue, or  lassitude.  HEENT:   No headaches,  Difficulty swallowing,  Tooth/dental problems, or  Sore throat,                No sneezing, itching, ear ache, nasal congestion, post nasal drip,   CV:  No chest pain,  Orthopnea, PND, swelling in lower extremities, anasarca, dizziness, palpitations, syncope.   GI  No heartburn, indigestion, abdominal pain, nausea, vomiting, diarrhea, change in bowel habits, loss of appetite, bloody stools.   Resp:   No coughing up of blood.  No chest wall deformity  Skin: no rash or lesions.  GU: no dysuria, change in color of urine, no urgency or frequency.  No flank pain, no hematuria   MS:  No joint pain or swelling.  No decreased range of motion.   Marland Kitchen  Psych:  No change in mood or affect. No depression or anxiety.             Objective:   Physical Exam  GEN: A/Ox3; pleasant , NAD, elderly   HEENT:  [place]/AT,  EACs-clear, TMs-wnl, NOSE-clear,  THROAT-clear, no lesions, no postnasal drip or exudate noted.   NECK:  Supple w/ fair ROM; no JVD; normal carotid impulses w/o bruits; no thyromegaly or nodules palpated; no lymphadenopathy.  RESP  Coarse BS diminshed bs in bases no accessory muscle use, no dullness to percussion  CARD:  RRR, no m/r/g  , no peripheral edema, pulses intact, no cyanosis or clubbing.  GI:   Soft & nt; nml bowel sounds; no organomegaly or masses detected.  Musco: Warm bil, no deformities or joint swelling noted.   Neuro: alert, no focal deficits noted.    Skin: Warm, no lesions or rashes         Assessment & Plan:   C O P D Copd Golds III , recent exacerbation now improved Plan No change in inhaled or maintenance medications. Return in  4 mo    Updated Medication List Outpatient Encounter Prescriptions as of 08/18/2010  Medication Sig Dispense Refill  . albuterol (PROAIR HFA) 108 (90 BASE) MCG/ACT inhaler Inhale 1-2 puffs into the lungs every 6 (six) hours as needed.        Marland Kitchen amLODipine (NORVASC) 5 MG tablet Take 5 mg by mouth daily.        Marland Kitchen aspirin 81 MG tablet Take 81 mg by mouth daily.        . beclomethasone (QVAR) 80 MCG/ACT inhaler Inhale 2 puffs into the lungs 2 (two) times daily.  1 Inhaler  6  . brimonidine (ALPHAGAN P) 0.1 % SOLN Place 1 drop into the left eye 2 (two) times daily.        . Cholecalciferol (VITAMIN D) 1000 UNITS capsule Take 1,000 Units by mouth daily.        . Cinnamon 500 MG capsule Take 500 mg by mouth daily.        . cloNIDine (CATAPRES) 0.2 MG tablet 1 tab by mouth as needed when BP is over 140       . diazepam (VALIUM) 5 MG tablet 1/2 tab by mouth once as daily as needed       . formoterol (FORADIL) 12 MCG capsule for inhaler Place 12 mcg into inhaler and inhale 2 (two) times daily.        Marland Kitchen glimepiride (AMARYL) 2 MG tablet Take 2 mg by mouth daily as needed. For high blood sugar       . losartan (COZAAR) 100 MG tablet Take 100 mg by mouth daily.        .  metFORMIN (GLUCOPHAGE) 500 MG tablet Take 1,000 mg by mouth 2 (two) times daily with a meal.        . milk thistle 175 MG tablet Take 175 mg by mouth daily.        . Multiple Vitamin (MULTIVITAMIN) capsule Take 1 capsule by mouth daily.        Marland Kitchen omeprazole (PRILOSEC) 20 MG capsule Take 40 mg by mouth daily.       . Potassium 99 MG TABS Take 1 tablet by mouth daily.        . promethazine (PHENERGAN) 25 MG tablet 1/2 - 1 once daily as needed      .  saw palmetto 500 MG capsule Take 500 mg by mouth daily.        . simvastatin (ZOCOR) 80 MG tablet 1/2 tab my mouth once daily       . Tamsulosin HCl (FLOMAX) 0.4 MG CAPS Take 1 tablet by mouth daily.      Marland Kitchen tiotropium (SPIRIVA) 18 MCG inhalation capsule Place 18 mcg into inhaler and inhale daily.        . valACYclovir (VALTREX) 500 MG tablet Take 1,000 mg by mouth every 8 (eight) hours as needed.       . vitamin E 400 UNIT capsule Take 400 Units by mouth daily.        . Zinc 100 MG TABS Take 1 tablet by mouth daily.        Marland Kitchen DISCONTD: QVAR 80 MCG/ACT inhaler USE 2 PUFFS TWICE DAILY  1 Inhaler  6

## 2010-09-30 ENCOUNTER — Ambulatory Visit: Payer: Medicare Other | Admitting: Adult Health

## 2010-09-30 ENCOUNTER — Ambulatory Visit (INDEPENDENT_AMBULATORY_CARE_PROVIDER_SITE_OTHER): Payer: Medicare Other | Admitting: Pulmonary Disease

## 2010-09-30 ENCOUNTER — Encounter: Payer: Self-pay | Admitting: Pulmonary Disease

## 2010-09-30 DIAGNOSIS — J209 Acute bronchitis, unspecified: Secondary | ICD-10-CM

## 2010-09-30 DIAGNOSIS — J40 Bronchitis, not specified as acute or chronic: Secondary | ICD-10-CM | POA: Insufficient documentation

## 2010-09-30 DIAGNOSIS — J449 Chronic obstructive pulmonary disease, unspecified: Secondary | ICD-10-CM

## 2010-09-30 MED ORDER — DOXYCYCLINE HYCLATE 100 MG PO CAPS: 100.0000 mg | ORAL_CAPSULE | Freq: Two times a day (BID) | ORAL | Status: AC

## 2010-09-30 NOTE — Assessment & Plan Note (Signed)
He has an acute bronchitis.  I don't think he needs an xray or prednisone.  Will give him course of doxycycline.  He is to call if no improvement.  Otherwise follow up with Dr. Delford Field in 2 months.

## 2010-09-30 NOTE — Assessment & Plan Note (Signed)
Continue his current inhaler regimen.

## 2010-09-30 NOTE — Progress Notes (Signed)
Subjective:    Patient ID: Chad [name], male    DOB: [birth year], 77 y.o.   MRN: [id number]  HPI  77 year old man with COPD primary emphysematous component.  Prior esophagectomy 2007 and abn CXR since that surgery.   He has notice chest congestion and increased cough for the past three days.  He is bringing up yellow phlegm.  He has notice more wheeze and chest tightness.  He has not had fever.  He does have more sinus congestion and sore throat.  He denies abdominal symptoms.  He denies recent sick exposures.  He has been using his proair more.  Past Medical History  Diagnosis Date  . Fatty liver disease, nonalcoholic   . Coronary artery stenosis   . Diverticulosis of colon   . Diaphragmatic hernia without mention of obstruction or gangrene   . Dyspnea   . COPD (chronic obstructive pulmonary disease)   . Personal history of other malignant neoplasm of skin   . Barrett's esophagus   . Arthritis   . TIA (transient ischemic attack)   . Diabetes mellitus   . Hyperlipidemia   . Acid reflux disease   . Glaucoma   . HTN (hypertension)      Family History  Problem Relation Age of Onset  . Colon cancer Mother   . Colon cancer Maternal Grandfather   . Lung cancer Mother   . Heart attack Mother   . Heart attack Brother   . Heart attack Brother   . Stomach cancer Father   . Esophageal cancer Father     Allergies  Allergen Reactions  . Moxifloxacin     REACTION: anaphylaxis  . Penicillins     REACTION: hives  . Sulfonamide Derivatives     REACTION: hives  . Timoptic     Causes severe chest congestion    Review of Systems     Objective:   Physical Exam  BP 138/78  Pulse 85  Temp(Src) 98.6 F (37 C) (Oral)  Ht 5\' 9"  (1.753 m)  Wt 189 lb 6.4 oz (85.911 kg)  BMI 27.97 kg/m2  SpO2 95%  General - speaks in full sentences HEENT - No sinus tenderness, clear drainage, no oral exudate, no LAN Cardiac - s1s2 no murmur Chest - prolonged exhalation, faint wheeze, no  rales/dullness Abd - soft, nontender, normal bowel sounds Ext - no e/c/c Neuro - normal strength, A&O x 3, CN intact Psych - normal mood,behavior Skin - no rashes     Assessment & Plan:   Acute bronchitis He has an acute bronchitis.  I don't think he needs an xray or prednisone.  Will give him course of doxycycline.  He is to call if no improvement.  Otherwise follow up with Dr. Delford Field in 2 months.  C O P D Continue his current inhaler regimen.    Updated Medication List Outpatient Encounter Prescriptions as of 09/30/2010  Medication Sig Dispense Refill  . albuterol (PROAIR HFA) 108 (90 BASE) MCG/ACT inhaler Inhale 1-2 puffs into the lungs every 6 (six) hours as needed.        Marland Kitchen amLODipine (NORVASC) 5 MG tablet Take 5 mg by mouth daily.        Marland Kitchen aspirin 81 MG tablet Take 81 mg by mouth daily.        . beclomethasone (QVAR) 80 MCG/ACT inhaler Inhale 2 puffs into the lungs 2 (two) times daily.  1 Inhaler  6  . brimonidine (ALPHAGAN P) 0.1 % SOLN Place  1 drop into the left eye 2 (two) times daily.        . Cholecalciferol (VITAMIN D) 1000 UNITS capsule Take 1,000 Units by mouth daily.        . Cinnamon 500 MG capsule Take 500 mg by mouth daily.        . cloNIDine (CATAPRES) 0.2 MG tablet 1 tab by mouth as needed when BP is over 140       . diazepam (VALIUM) 5 MG tablet 1/2 tab by mouth once as daily as needed       . formoterol (FORADIL) 12 MCG capsule for inhaler Place 12 mcg into inhaler and inhale 2 (two) times daily.        Marland Kitchen glimepiride (AMARYL) 2 MG tablet Take 2 mg by mouth daily as needed. For high blood sugar       . losartan (COZAAR) 100 MG tablet Take 100 mg by mouth daily.        . metFORMIN (GLUCOPHAGE) 500 MG tablet Take 1,000 mg by mouth 2 (two) times daily with a meal.        . milk thistle 175 MG tablet Take 175 mg by mouth daily.        . Multiple Vitamin (MULTIVITAMIN) capsule Take 1 capsule by mouth daily.        Marland Kitchen omeprazole (PRILOSEC) 20 MG capsule Take 40 mg by  mouth daily.       . Potassium 99 MG TABS Take 1 tablet by mouth daily.        . promethazine (PHENERGAN) 25 MG tablet 1/2 - 1 once daily as needed      . saw palmetto 500 MG capsule Take 500 mg by mouth daily.        . simvastatin (ZOCOR) 80 MG tablet 1/2 tab my mouth once daily       . Tamsulosin HCl (FLOMAX) 0.4 MG CAPS Take 1 tablet by mouth daily.      Marland Kitchen tiotropium (SPIRIVA) 18 MCG inhalation capsule Place 18 mcg into inhaler and inhale daily.        . valACYclovir (VALTREX) 500 MG tablet Take 1,000 mg by mouth every 8 (eight) hours as needed.       . vitamin E 400 UNIT capsule Take 400 Units by mouth daily.        . Zinc 100 MG TABS Take 1 tablet by mouth daily.        Marland Kitchen doxycycline (VIBRAMYCIN) 100 MG capsule Take 1 capsule (100 mg total) by mouth 2 (two) times daily.  20 capsule  0

## 2010-09-30 NOTE — Patient Instructions (Signed)
Doxycycline 100 mg twice per day for 10 days Call if symptoms do not improve Follow up with Dr. Delford Field in 2 months

## 2010-10-08 ENCOUNTER — Encounter: Payer: Self-pay | Admitting: Pulmonary Disease

## 2010-10-21 ENCOUNTER — Ambulatory Visit (INDEPENDENT_AMBULATORY_CARE_PROVIDER_SITE_OTHER): Payer: Medicare Other | Admitting: Adult Health

## 2010-10-21 ENCOUNTER — Encounter: Payer: Self-pay | Admitting: Adult Health

## 2010-10-21 VITALS — BP 144/76 | HR 75 | Temp 97.8°F | Ht 70.0 in | Wt 189.6 lb

## 2010-10-21 DIAGNOSIS — J449 Chronic obstructive pulmonary disease, unspecified: Secondary | ICD-10-CM

## 2010-10-21 MED ORDER — PREDNISONE 10 MG PO TABS: ORAL_TABLET | ORAL | Status: AC

## 2010-10-21 MED ORDER — CEFDINIR 300 MG PO CAPS: 300.0000 mg | ORAL_CAPSULE | Freq: Two times a day (BID) | ORAL | Status: AC

## 2010-10-21 NOTE — Progress Notes (Signed)
Subjective:    Patient ID: Chad [name], male    DOB: [birth year], 77 y.o.   MRN: [id number]  HPI 77 year old man with COPD primary emphysematous component.  Prior esophagectomy 2007 and abn CXR since that surgery.    07/21/10 Acute OV  Pt presents for an acute office visit. Complains of wheezing, increased SOB, chest congestion x 3 days.  Cough is getting worse. High temps outside seem to be causing his breathing to be worse.   >>tx w/ doxycycline and pred   08/18/2010 Pt seen by NP last ov and rx Doxycycline 100mg  Twice daily For 7 days  Mucinex DM Twice daily As needed Cough/congestion  Prednisone taper over next week  Now is better.    09/30/10  He has notice chest congestion and increased cough for the past three days.  He is bringing up yellow phlegm.  He has notice more wheeze and chest tightness.  He has not had fever.  He does have more sinus congestion and sore throat.  He denies abdominal symptoms.  He denies recent sick exposures.  He has been using his proair more. >>rx doxcycline   10/21/2010 Acute OV  Pt c/o wheezing and tightness in chest x 1 wk- relates to being in a house with smokers for a wk. He has also had some prod cough with minimal green sputum. Got better but , cough returned 1 week ago. PT has multiple abx allergies, has taken cephalasporins in past without known difficulties.  No hemoptysis or chest pain. No edema.  OTC not working.    Past Medical History  Diagnosis Date  . Fatty liver disease, nonalcoholic   . Coronary artery stenosis   . Diverticulosis of colon   . Diaphragmatic hernia without mention of obstruction or gangrene   . Dyspnea   . COPD (chronic obstructive pulmonary disease)   . Personal history of other malignant neoplasm of skin   . Barrett's esophagus   . Arthritis   . TIA (transient ischemic attack)   . Diabetes mellitus   . Hyperlipidemia   . Acid reflux disease   . Glaucoma   . HTN (hypertension)   . RENAL CALCULUS, HX OF  08/28/2008  . CAROTID ARTERY STENOSIS, BILATERAL 08/27/2008     Family History  Problem Relation Age of Onset  . Colon cancer Mother   . Colon cancer Maternal Grandfather   . Lung cancer Mother   . Heart attack Mother   . Heart attack Brother   . Heart attack Brother   . Stomach cancer Father   . Esophageal cancer Father     Allergies  Allergen Reactions  . Moxifloxacin     REACTION: anaphylaxis  . Penicillins     REACTION: hives  . Sulfonamide Derivatives     REACTION: hives  . Timoptic     Causes severe chest congestion    Review of Systems Constitutional:   No  weight loss, night sweats,  Fevers, chills, fatigue, or  lassitude.  HEENT:   No headaches,  Difficulty swallowing,  Tooth/dental problems, or  Sore throat,                No sneezing, itching, ear ache, nasal congestion, post nasal drip,   CV:  No chest pain,  Orthopnea, PND, swelling in lower extremities, anasarca, dizziness, palpitations, syncope.   GI  No heartburn, indigestion, abdominal pain, nausea, vomiting, diarrhea, change in bowel habits, loss of appetite, bloody stools.   Resp:  No coughing up of blood.    No chest wall deformity  Skin: no rash or lesions.  GU: no dysuria, change in color of urine, no urgency or frequency.  No flank pain, no hematuria   MS:  No joint pain or swelling.  No decreased range of motion.  No back pain.  Psych:  No change in mood or affect. No depression or anxiety.  No memory loss.          Objective:   Physical Exam   BP 144/76  Pulse 75  Temp(Src) 97.8 F (36.6 C) (Oral)  Ht 5\' 10"  (1.778 m)  Wt 189 lb 9.6 oz (86.002 kg)  BMI 27.20 kg/m2  SpO2 97%  General - speaks in full sentences HEENT - No sinus tenderness, clear drainage, no oral exudate, no LAN Cardiac - s1s2 no murmur Chest - prolonged exhalation, faint wheeze, no rales/dullness Abd - soft, nontender, normal bowel sounds Ext - no e/c/c Neuro - normal strength, A&O x 3, CN intact Psych -  normal mood,behavior Skin - no rashes     Assessment & Plan:   No problem-specific assessment & plan notes found for this encounter.   Updated Medication List Outpatient Encounter Prescriptions as of 10/21/2010  Medication Sig Dispense Refill  . albuterol (PROAIR HFA) 108 (90 BASE) MCG/ACT inhaler Inhale 1-2 puffs into the lungs every 6 (six) hours as needed.        Marland Kitchen amLODipine (NORVASC) 5 MG tablet Take 5 mg by mouth daily.        Marland Kitchen aspirin 81 MG tablet Take 81 mg by mouth daily.        . beclomethasone (QVAR) 80 MCG/ACT inhaler Inhale 2 puffs into the lungs 2 (two) times daily.  1 Inhaler  6  . brimonidine (ALPHAGAN P) 0.1 % SOLN Place 1 drop into the left eye 2 (two) times daily.        . Cholecalciferol (VITAMIN D) 1000 UNITS capsule Take 1,000 Units by mouth daily.        . Cinnamon 500 MG capsule Take 500 mg by mouth daily.        . cloNIDine (CATAPRES) 0.2 MG tablet 1 tab by mouth as needed when BP is over 140       . diazepam (VALIUM) 5 MG tablet 1/2 tab by mouth once as daily as needed       . formoterol (FORADIL) 12 MCG capsule for inhaler Place 12 mcg into inhaler and inhale 2 (two) times daily.        Marland Kitchen glimepiride (AMARYL) 2 MG tablet Take 2 mg by mouth daily as needed. For high blood sugar       . losartan (COZAAR) 100 MG tablet Take 100 mg by mouth daily.        . metFORMIN (GLUCOPHAGE) 500 MG tablet Take 1,000 mg by mouth 2 (two) times daily with a meal.        . milk thistle 175 MG tablet Take 175 mg by mouth daily.        . Multiple Vitamin (MULTIVITAMIN) capsule Take 1 capsule by mouth daily.        Marland Kitchen omeprazole (PRILOSEC) 20 MG capsule Take 40 mg by mouth daily.       . Potassium 99 MG TABS Take 1 tablet by mouth daily.        . promethazine (PHENERGAN) 25 MG tablet 1/2 - 1 once daily as needed      . saw  palmetto 500 MG capsule Take 500 mg by mouth daily.        . simvastatin (ZOCOR) 80 MG tablet 1/2 tab my mouth once daily       . Tamsulosin HCl (FLOMAX) 0.4 MG  CAPS Take 1 tablet by mouth 2 (two) times daily.       Marland Kitchen tiotropium (SPIRIVA) 18 MCG inhalation capsule Place 18 mcg into inhaler and inhale daily.        . valACYclovir (VALTREX) 500 MG tablet Take 1,000 mg by mouth every 8 (eight) hours as needed.       . vitamin E 400 UNIT capsule Take 400 Units by mouth daily.        . Zinc 100 MG TABS Take 1 tablet by mouth daily.        . cefdinir (OMNICEF) 300 MG capsule Take 1 capsule (300 mg total) by mouth 2 (two) times daily.  14 capsule  0  . predniSONE (DELTASONE) 10 MG tablet 4 tabs for 2 days, then 3 tabs for 2 days, 2 tabs for 2 days, then 1 tab for 2 days, then stop  20 tablet  0

## 2010-10-21 NOTE — Patient Instructions (Signed)
Omnicef 300mg  Twice daily  For 7 days  Mucinex DM Twice daily  As needed  Cough/congestion  Prednisone taper over next week  Fluids and rest  Please contact office for sooner follow up if symptoms do not improve or worsen or seek emergency care

## 2010-10-21 NOTE — Assessment & Plan Note (Addendum)
Recurrent COPD flare  Recent cxr with no acute process.  May need to consider increasing QVAR to Symbicort and/or adding Daliresp to help prevent flares in futures if not improving.   Plan:  Omnicef 300mg  Twice daily  For 7 days  Mucinex DM Twice daily  As needed  Cough/congestion  Prednisone taper over next week  Fluids and rest  Please contact office for sooner follow up if symptoms do not improve or worsen or seek emergency care

## 2010-11-05 LAB — POCT I-STAT CREATININE: Creatinine, Ser: 1

## 2010-11-05 LAB — CBC
MCHC: 33.6
MCV: 81.6
Platelets: 185
RDW: 20.1 — ABNORMAL HIGH

## 2010-11-05 LAB — DIFFERENTIAL
Basophils Absolute: 0
Basophils Relative: 0
Eosinophils Absolute: 0.1
Monocytes Relative: 5
Neutro Abs: 5.6
Neutrophils Relative %: 77

## 2010-11-05 LAB — I-STAT 8, (EC8 V) (CONVERTED LAB)
Acid-Base Excess: 3 — ABNORMAL HIGH
Bicarbonate: 29.9 — ABNORMAL HIGH
Chloride: 98
Operator id: 277751
pCO2, Ven: 54.5 — ABNORMAL HIGH
pH, Ven: 7.348 — ABNORMAL HIGH

## 2010-11-10 ENCOUNTER — Telehealth: Payer: Self-pay | Admitting: Critical Care Medicine

## 2010-11-10 MED ORDER — CEFDINIR 300 MG PO CAPS: 300.0000 mg | ORAL_CAPSULE | Freq: Two times a day (BID) | ORAL | Status: AC

## 2010-11-10 MED ORDER — PREDNISONE 10 MG PO TABS: ORAL_TABLET | ORAL | Status: DC

## 2010-11-10 NOTE — Telephone Encounter (Signed)
Call in omniceph 300mg  one po bid x 10days and prednisone 10mg  Take 4 for three days 3 for three days 2 for three days 1 for three days and stop  #30

## 2010-11-10 NOTE — Telephone Encounter (Signed)
Pt has called & would like to know the status of the Rx.  Pt can be reached at [phone]. Antionette Fairy

## 2010-11-10 NOTE — Telephone Encounter (Signed)
Pt c/o congestion and cough with green sputum x 3 days. He has also noticed a slight increase in sob. He denies any wheeze, sore throat or f/c/s. He was seen by TP on 10/21/2010 and was given Omnicef and Pred taper and says his sxs did improve but have returned. There are no open appts today with any of our providers. Pt uses [facility] on [address]. Dr. Delford Field, pls advise.

## 2010-11-10 NOTE — Telephone Encounter (Signed)
Pt is aware of prescriptions and directions for use. He will call if his sxs do not imrpove or seek emergency help if needed.

## 2010-12-01 ENCOUNTER — Ambulatory Visit: Payer: Medicare Other | Admitting: Pulmonary Disease

## 2010-12-22 ENCOUNTER — Ambulatory Visit (INDEPENDENT_AMBULATORY_CARE_PROVIDER_SITE_OTHER): Payer: Medicare Other | Admitting: Critical Care Medicine

## 2010-12-22 ENCOUNTER — Encounter: Payer: Self-pay | Admitting: Critical Care Medicine

## 2010-12-22 DIAGNOSIS — J449 Chronic obstructive pulmonary disease, unspecified: Secondary | ICD-10-CM

## 2010-12-22 MED ORDER — FLUTICASONE FUROATE 27.5 MCG/SPRAY NA SUSP: 2.0000 | Freq: Every day | NASAL | Status: DC

## 2010-12-22 MED ORDER — CEFDINIR 300 MG PO CAPS: 300.0000 mg | ORAL_CAPSULE | Freq: Two times a day (BID) | ORAL | Status: AC

## 2010-12-22 NOTE — Assessment & Plan Note (Addendum)
Severe Copd with acute sinusitis flare Plan Cefdinir 600mg  /d x 10days Veramyst x one sample Flu vaccine No change in inhaled or maintenance medications. Return in  6 weeks

## 2010-12-22 NOTE — Progress Notes (Signed)
Subjective:    Patient ID: Chad [name], male    DOB: 1929/11/26, 77 y.o.   MRN: [id number]  HPI 77 year old man with COPD primary emphysematous component.  Prior esophagectomy 2007 and abn CXR since that surgery.    07/21/10 Acute OV  Pt presents for an acute office visit. Complains of wheezing, increased SOB, chest congestion x 3 days.  Cough is getting worse. High temps outside seem to be causing his breathing to be worse.   >>tx w/ doxycycline and pred   08/18/2010 Pt seen by NP last ov and rx Doxycycline 100mg  Twice daily For 7 days  Mucinex DM Twice daily As needed Cough/congestion  Prednisone taper over next week  Now is better.    09/30/10  He has notice chest congestion and increased cough for the past three days.  He is bringing up yellow phlegm.  He has notice more wheeze and chest tightness.  He has not had fever.  He does have more sinus congestion and sore throat.  He denies abdominal symptoms.  He denies recent sick exposures.  He has been using his proair more. >>rx doxcycline   9/27Acute OV  Pt c/o wheezing and tightness in chest x 1 wk- relates to being in a house with smokers for a wk. He has also had some prod cough with minimal green sputum. Got better but , cough returned 1 week ago. PT has multiple abx allergies, has taken cephalasporins in past without known difficulties.  No hemoptysis or chest pain. No edema.  OTC not working.   12/22/2010 Seen 9/12 Rx ABX and pred/omniceph and helped.  Now with pndrip and sore throat with this. Green mucus out of the nose.  Notes sinus pressure in eyes/cheeks. No cough.  Dyspnea is worse, no wheeze.   Past Medical History  Diagnosis Date  . Fatty liver disease, nonalcoholic   . Coronary artery stenosis   . Diverticulosis of colon   . Diaphragmatic hernia without mention of obstruction or gangrene   . Dyspnea   . COPD (chronic obstructive pulmonary disease)   . Personal history of other malignant neoplasm of skin     . Barrett's esophagus   . Arthritis   . TIA (transient ischemic attack)   . Diabetes mellitus   . Hyperlipidemia   . Acid reflux disease   . Glaucoma   . HTN (hypertension)   . RENAL CALCULUS, HX OF 08/28/2008  . CAROTID ARTERY STENOSIS, BILATERAL 08/27/2008     Family History  Problem Relation Age of Onset  . Colon cancer Mother   . Colon cancer Maternal Grandfather   . Lung cancer Mother   . Heart attack Mother   . Heart attack Brother   . Heart attack Brother   . Stomach cancer Father   . Esophageal cancer Father     Allergies  Allergen Reactions  . Moxifloxacin     REACTION: anaphylaxis  . Penicillins     REACTION: hives  . Sulfonamide Derivatives     REACTION: hives  . Timoptic     Causes severe chest congestion    Review of Systems Constitutional:   No  weight loss, night sweats,  Fevers, chills, fatigue, or  lassitude.  HEENT:   No headaches,  Difficulty swallowing,  Tooth/dental problems, or  Sore throat,                No sneezing, itching, ear ache, nasal congestion, post nasal drip,   CV:  No  chest pain,  Orthopnea, PND, swelling in lower extremities, anasarca, dizziness, palpitations, syncope.   GI  No heartburn, indigestion, abdominal pain, nausea, vomiting, diarrhea, change in bowel habits, loss of appetite, bloody stools.   Resp:    No coughing up of blood.    No chest wall deformity  Skin: no rash or lesions.  GU: no dysuria, change in color of urine, no urgency or frequency.  No flank pain, no hematuria   MS:  No joint pain or swelling.  No decreased range of motion.  No back pain.  Psych:  No change in mood or affect. No depression or anxiety.  No memory loss.          Objective:   Physical Exam   BP 138/62  Pulse 73  Temp(Src) 98.3 F (36.8 C) (Oral)  Ht 5\' 10"  (1.778 m)  Wt 190 lb 12.8 oz (86.546 kg)  BMI 27.38 kg/m2  SpO2 96%  General - speaks in full sentences HEENT - No sinus tenderness, purulent  drainage, no oral  exudate, no LAN Cardiac - s1s2 no murmur Chest - prolonged exhalation, faint wheeze, no rales/dullness Abd - soft, nontender, normal bowel sounds Ext - no e/c/c Neuro - normal strength, A&O x 3, CN intact Psych - normal mood,behavior Skin - no rashes     Assessment & Plan:   C O P D Severe Copd with acute sinusitis flare Plan Cefdinir 600mg  /d x 10days Veramyst x one sample Flu vaccine No change in inhaled or maintenance medications. Return in  6 weeks     Updated Medication List Outpatient Encounter Prescriptions as of 12/22/2010  Medication Sig Dispense Refill  . albuterol (PROAIR HFA) 108 (90 BASE) MCG/ACT inhaler Inhale 1-2 puffs into the lungs every 6 (six) hours as needed.        Marland Kitchen amLODipine (NORVASC) 5 MG tablet Take 5 mg by mouth daily.        Marland Kitchen aspirin 81 MG tablet Take 81 mg by mouth daily.        . beclomethasone (QVAR) 80 MCG/ACT inhaler Inhale 2 puffs into the lungs 2 (two) times daily.  1 Inhaler  6  . brimonidine (ALPHAGAN P) 0.1 % SOLN Place 1 drop into the left eye 2 (two) times daily.        . Cholecalciferol (VITAMIN D) 1000 UNITS capsule Take 1,000 Units by mouth daily.        . Cinnamon 500 MG capsule Take 500 mg by mouth daily.        . cloNIDine (CATAPRES) 0.2 MG tablet 1 tab by mouth as needed when BP is over 140       . diazepam (VALIUM) 5 MG tablet 1/2 tab by mouth once as daily as needed       . formoterol (FORADIL) 12 MCG capsule for inhaler Place 12 mcg into inhaler and inhale 2 (two) times daily.        Marland Kitchen glimepiride (AMARYL) 2 MG tablet Take 2 mg by mouth daily as needed. For high blood sugar       . glipiZIDE (GLUCOTROL XL) 2.5 MG 24 hr tablet       . losartan (COZAAR) 100 MG tablet Take 100 mg by mouth daily.        . metFORMIN (GLUCOPHAGE) 500 MG tablet Take 1,000 mg by mouth 2 (two) times daily with a meal.        . milk thistle 175 MG tablet Take 175 mg by mouth  daily.        . Multiple Vitamin (MULTIVITAMIN) capsule Take 1 capsule by  mouth daily.        Marland Kitchen omeprazole (PRILOSEC) 20 MG capsule Take 40 mg by mouth daily.       . Potassium 99 MG TABS Take 1 tablet by mouth daily.        . promethazine (PHENERGAN) 25 MG tablet 1/2 - 1 once daily as needed      . saw palmetto 500 MG capsule Take 500 mg by mouth daily.        . simvastatin (ZOCOR) 80 MG tablet 1/2 tab my mouth once daily       . Tamsulosin HCl (FLOMAX) 0.4 MG CAPS Take 1 tablet by mouth 2 (two) times daily.       Marland Kitchen tiotropium (SPIRIVA) 18 MCG inhalation capsule Place 18 mcg into inhaler and inhale daily.        . valACYclovir (VALTREX) 500 MG tablet Take 1,000 mg by mouth every 8 (eight) hours as needed.       . vitamin E 400 UNIT capsule Take 400 Units by mouth daily.        . Zinc 100 MG TABS Take 1 tablet by mouth daily.        . cefdinir (OMNICEF) 300 MG capsule Take 1 capsule (300 mg total) by mouth 2 (two) times daily.  20 capsule  0  . fluticasone (VERAMYST) 27.5 MCG/SPRAY nasal spray Place 2 sprays into the nose daily.  10 g  0  . DISCONTD: predniSONE (DELTASONE) 10 MG tablet 4 tablets x 3 days, 3 tablets x 3 days, 2 tablets x 3 days, 1 tablet x 3 days and stop.  30 tablet  0

## 2010-12-22 NOTE — Patient Instructions (Signed)
Use veramyst two sprays each nostril daily, use sample til gone Cefdinir 300mg  twice daily for 10days All other meds the same Flu vaccine today Return 6 weeks

## 2010-12-31 ENCOUNTER — Telehealth: Payer: Self-pay | Admitting: Critical Care Medicine

## 2010-12-31 MED ORDER — CLARITHROMYCIN 500 MG PO TABS: ORAL_TABLET | ORAL | Status: DC

## 2010-12-31 NOTE — Telephone Encounter (Signed)
RX has been sent and pt is aware of PW recs.

## 2010-12-31 NOTE — Telephone Encounter (Signed)
Rx Biaxin 500mg  bid x 7days generic please

## 2010-12-31 NOTE — Telephone Encounter (Signed)
Spoke with pt and he states his cough is no better from when he saw Dr. Delford Field on 12/22/10. Pt is still coughing up green phlem, wheezing, having sinus drainage. Pt will finish cefdinir tomorrow. Pt denies any fever, nausea, vomiting, sore thraot, SOB, chest tightness. Pt is requesting further recs from Dr. Delford Field. Please advise, thanks  Allergies  Allergen Reactions  . Moxifloxacin     REACTION: anaphylaxis  . Penicillins     REACTION: hives  . Sulfonamide Derivatives     REACTION: hives  . Timoptic     Causes severe chest congestion

## 2011-01-06 ENCOUNTER — Other Ambulatory Visit: Payer: Self-pay | Admitting: Dermatology

## 2011-01-14 ENCOUNTER — Ambulatory Visit (INDEPENDENT_AMBULATORY_CARE_PROVIDER_SITE_OTHER): Payer: Medicare Other | Admitting: Adult Health

## 2011-01-14 ENCOUNTER — Ambulatory Visit (INDEPENDENT_AMBULATORY_CARE_PROVIDER_SITE_OTHER)
Admission: RE | Admit: 2011-01-14 | Discharge: 2011-01-14 | Disposition: A | Discharge status: Home / Self Care | Payer: Medicare Other | Source: Ambulatory Visit | Attending: Adult Health | Admitting: Adult Health

## 2011-01-14 ENCOUNTER — Other Ambulatory Visit: Payer: Self-pay | Admitting: Adult Health

## 2011-01-14 ENCOUNTER — Telehealth: Payer: Self-pay | Admitting: *Deleted

## 2011-01-14 ENCOUNTER — Encounter: Payer: Self-pay | Admitting: Adult Health

## 2011-01-14 ENCOUNTER — Telehealth: Payer: Self-pay | Admitting: Critical Care Medicine

## 2011-01-14 VITALS — BP 122/78 | HR 74 | Temp 98.1°F | Ht 70.0 in | Wt 186.6 lb

## 2011-01-14 DIAGNOSIS — R05 Cough: Secondary | ICD-10-CM

## 2011-01-14 DIAGNOSIS — J189 Pneumonia, unspecified organism: Secondary | ICD-10-CM

## 2011-01-14 DIAGNOSIS — J449 Chronic obstructive pulmonary disease, unspecified: Secondary | ICD-10-CM

## 2011-01-14 MED ORDER — DOXYCYCLINE HYCLATE 100 MG PO TABS: ORAL_TABLET | ORAL | Status: DC

## 2011-01-14 MED ORDER — LEVALBUTEROL HCL 0.63 MG/3ML IN NEBU
0.6300 mg | INHALATION_SOLUTION | Freq: Three times a day (TID) | RESPIRATORY_TRACT | Status: DC
  Administered 2011-01-14: 0.63 mg via RESPIRATORY_TRACT

## 2011-01-14 MED ORDER — PREDNISONE 10 MG PO TABS: ORAL_TABLET | ORAL | Status: DC

## 2011-01-14 MED ORDER — MOXIFLOXACIN HCL 400 MG PO TABS: 400.0000 mg | ORAL_TABLET | Freq: Every day | ORAL | Status: AC

## 2011-01-14 NOTE — Telephone Encounter (Signed)
Pt scheduled to see TP this AM @ 10:45 to have CXR first.

## 2011-01-14 NOTE — Assessment & Plan Note (Signed)
Slow to resolve COPD exacerbation with associated RUL opacity c/w PNA (will hold on rocephin injection due to PCN allergy)   Plan:  Avelox 400mg  daily for 7 days  Mucinex DM Twice daily As needed  Cough/congestion  Prednisone taper over next week  Fludis and rest  Please contact office for sooner follow up if symptoms do not improve or worsen or seek emergency care  follow up Dr. Delford Field  In 2-3  weeks with chest xray

## 2011-01-14 NOTE — Patient Instructions (Signed)
Avelox 400mg  daily for 7 days  Mucinex DM Twice daily As needed  Cough/congestion  Prednisone taper over next week  Fludis and rest  Please contact office for sooner follow up if symptoms do not improve or worsen or seek emergency care  follow up Dr. Delford Field  In 2-3  weeks with chest xray

## 2011-01-14 NOTE — Progress Notes (Signed)
Addended by: Ozella Almond R on: 01/14/2011 11:48 AM   Modules accepted: Orders

## 2011-01-14 NOTE — Telephone Encounter (Signed)
Pt seen today for OV with TP and was Rx'd Avelox. Pt's wife called and advised she was not aware that pt was prescribed Avelox and he is severely allergic to same (cost $40). Spoke with Dr. Maple Hudson after he reviewed OV note he advised to try Doxycycline 100mg  take 2 tablets today and then 1 tablet daily until gone. Wife is aware and medication sent to pharmacy.

## 2011-01-14 NOTE — Progress Notes (Signed)
Subjective:    Patient ID: Chad [name], male    DOB: 1929/04/04, 77 y.o.   MRN: [id number]  HPI 77 year old man with COPD primary emphysematous component.  Prior esophagectomy 2007 and abn CXR since that surgery.    07/21/10 Acute OV  Pt presents for an acute office visit. Complains of wheezing, increased SOB, chest congestion x 3 days.  Cough is getting worse. High temps outside seem to be causing his breathing to be worse.   >>tx w/ doxycycline and pred   08/18/2010 Pt seen by NP last ov and rx Doxycycline 100mg  Twice daily For 7 days  Mucinex DM Twice daily As needed Cough/congestion  Prednisone taper over next week  Now is better.    09/30/10  He has notice chest congestion and increased cough for the past three days.  He is bringing up yellow phlegm.  He has notice more wheeze and chest tightness.  He has not had fever.  He does have more sinus congestion and sore throat.  He denies abdominal symptoms.  He denies recent sick exposures.  He has been using his proair more. >>rx doxcycline   9/27Acute OV  Pt c/o wheezing and tightness in chest x 1 wk- relates to being in a house with smokers for a wk. He has also had some prod cough with minimal green sputum. Got better but , cough returned 1 week ago. PT has multiple abx allergies, has taken cephalasporins in past without known difficulties.  No hemoptysis or chest pain. No edema.  OTC not working.   12/22/2010 Seen 9/12 Rx ABX and pred/omniceph and helped.  Now with pndrip and sore throat with this. Green mucus out of the nose.  Notes sinus pressure in eyes/cheeks. No cough.  Dyspnea is worse, no wheeze. >>Omnicef rx   01/14/2011 Acute OV  Pt returns for an acute office visit. Complains of persistent cough. Seen 3 weeks ago with COPD flare , tx w/ Omnicef . Called back due to cough /congestion . Rx Biaxin sent . He still coughing up thick mucus. Does not have any energy and is now wheezing. No n/v/d . Appetite is fair.  No  hemotpysis.  CXR today shows RUL opacity.   Past Medical History  Diagnosis Date  . Fatty liver disease, nonalcoholic   . Coronary artery stenosis   . Diverticulosis of colon   . Diaphragmatic hernia without mention of obstruction or gangrene   . Dyspnea   . COPD (chronic obstructive pulmonary disease)   . Personal history of other malignant neoplasm of skin   . Barrett's esophagus   . Arthritis   . TIA (transient ischemic attack)   . Diabetes mellitus   . Hyperlipidemia   . Acid reflux disease   . Glaucoma   . HTN (hypertension)   . RENAL CALCULUS, HX OF 08/28/2008  . CAROTID ARTERY STENOSIS, BILATERAL 08/27/2008     Family History  Problem Relation Age of Onset  . Colon cancer Mother   . Colon cancer Maternal Grandfather   . Lung cancer Mother   . Heart attack Mother   . Heart attack Brother   . Heart attack Brother   . Stomach cancer Father   . Esophageal cancer Father     Allergies  Allergen Reactions  . Moxifloxacin     REACTION: anaphylaxis  . Penicillins     REACTION: hives  . Sulfonamide Derivatives     REACTION: hives  . Timoptic     Causes  severe chest congestion    Review of Systems Constitutional:   No  weight loss, night sweats,  Fevers, chills,  +fatigue, or  lassitude.  HEENT:   No headaches,  Difficulty swallowing,  Tooth/dental problems, or  Sore throat,                No sneezing, itching, ear ache,  =nasal congestion, post nasal drip,   CV:  No chest pain,  Orthopnea, PND, swelling in lower extremities, anasarca, dizziness, palpitations, syncope.   GI  No heartburn, indigestion, abdominal pain, nausea, vomiting, diarrhea, change in bowel habits, loss of appetite, bloody stools.   Resp:    No coughing up of blood.    No chest wall deformity  Skin: no rash or lesions.  GU: no dysuria, change in color of urine, no urgency or frequency.  No flank pain, no hematuria   MS:  No joint pain or swelling.  No decreased range of motion.  No back  pain.  Psych:  No change in mood or affect. No depression or anxiety.  No memory loss.          Objective:   Physical Exam   BP 122/78  Pulse 74  Temp(Src) 98.1 F (36.7 C) (Oral)  Ht 5\' 10"  (1.778 m)  Wt 186 lb 9.6 oz (84.641 kg)  BMI 26.77 kg/m2  SpO2 97%  General - speaks in full sentences, NAD  HEENT - No sinus tenderness, purulent  drainage, no oral exudate, no LAN Cardiac - s1s2 no murmur Chest - prolonged exhalation, faint wheeze, no rales/dullness Abd - soft, nontender, normal bowel sounds Ext - no e/c/c Neuro - normal strength, A&O x 3, CN intact Psych - normal mood,behavior Skin - no rashes    CXR : 01/14/2011 >>RUL opacity  Assessment & Plan:        Updated Medication List Outpatient Encounter Prescriptions as of 01/14/2011  Medication Sig Dispense Refill  . albuterol (PROAIR HFA) 108 (90 BASE) MCG/ACT inhaler Inhale 1-2 puffs into the lungs every 6 (six) hours as needed.        Marland Kitchen amLODipine (NORVASC) 5 MG tablet Take 5 mg by mouth daily.        Marland Kitchen aspirin 81 MG tablet Take 81 mg by mouth daily.        . beclomethasone (QVAR) 80 MCG/ACT inhaler Inhale 2 puffs into the lungs 2 (two) times daily.  1 Inhaler  6  . brimonidine (ALPHAGAN P) 0.1 % SOLN Place 1 drop into the left eye 2 (two) times daily.        . Cholecalciferol (VITAMIN D) 1000 UNITS capsule Take 1,000 Units by mouth daily.        . Cinnamon 500 MG capsule Take 500 mg by mouth daily.        . cloNIDine (CATAPRES) 0.2 MG tablet 1 tab by mouth as needed when BP is over 140       . diazepam (VALIUM) 5 MG tablet 1/2 tab by mouth once as daily as needed       . fluticasone (VERAMYST) 27.5 MCG/SPRAY nasal spray Place 2 sprays into the nose daily.  10 g  0  . formoterol (FORADIL) 12 MCG capsule for inhaler Place 12 mcg into inhaler and inhale 2 (two) times daily.        Marland Kitchen glimepiride (AMARYL) 2 MG tablet Take 2 mg by mouth daily as needed. For high blood sugar       . glipiZIDE (GLUCOTROL  XL) 2.5 MG  24 hr tablet Take 2.5 mg by mouth daily with breakfast.       . losartan (COZAAR) 100 MG tablet Take 100 mg by mouth daily.        . metFORMIN (GLUCOPHAGE) 500 MG tablet Take 1,000 mg by mouth 2 (two) times daily with a meal.        . milk thistle 175 MG tablet Take 175 mg by mouth daily.        . Multiple Vitamin (MULTIVITAMIN) capsule Take 1 capsule by mouth daily.        Marland Kitchen omeprazole (PRILOSEC) 20 MG capsule Take 40 mg by mouth daily.       . Potassium 99 MG TABS Take 1 tablet by mouth daily.        . promethazine (PHENERGAN) 25 MG tablet 1/2 - 1 once daily as needed      . saw palmetto 500 MG capsule Take 500 mg by mouth daily.        . simvastatin (ZOCOR) 80 MG tablet 1/2 tab my mouth once daily       . Tamsulosin HCl (FLOMAX) 0.4 MG CAPS Take 1 tablet by mouth 2 (two) times daily.       Marland Kitchen tiotropium (SPIRIVA) 18 MCG inhalation capsule Place 18 mcg into inhaler and inhale daily.        . valACYclovir (VALTREX) 500 MG tablet Take 1,000 mg by mouth every 8 (eight) hours as needed.       . vitamin E 400 UNIT capsule Take 400 Units by mouth daily.        . Zinc 100 MG TABS Take 1 tablet by mouth daily.        Marland Kitchen DISCONTD: clarithromycin (BIAXIN) 500 MG tablet 1 tablet twice a day x 7 days  14 tablet  0

## 2011-01-24 ENCOUNTER — Telehealth: Payer: Self-pay | Admitting: Critical Care Medicine

## 2011-01-24 MED ORDER — PREDNISONE 10 MG PO TABS: ORAL_TABLET | ORAL | Status: DC

## 2011-01-24 NOTE — Telephone Encounter (Signed)
I spoke with spouse and she states pt was seen on 01/14/11 and ws advised according to his cxr he had a tough of PNA. She states he was feeling fine and then a couple days ago he started coughing up lots of clear phlem and wheezing. Pt is not experiencing any other symptoms. Spouse is requesting further recs. Since PW is not in will forward to Dr. Sherene Sires for recs. Please advise. Thanks  Allergies  Allergen Reactions  . Moxifloxacin     REACTION: anaphylaxis  . Penicillins     REACTION: hives  . Sulfonamide Derivatives     REACTION: hives  . Timoptic     Causes severe chest congestion

## 2011-01-24 NOTE — Telephone Encounter (Signed)
Called spoke with pt's spouse, advised of MW's recs.  Pt spouse okay with this and verbalized her understanding.  rx sent to verified pharmacy.  Will sign and forward to PEW as FYI.

## 2011-01-24 NOTE — Telephone Encounter (Signed)
No further abx  Prednisone 10 mg take  4 each am x 2 days,   2 each am x 2 days,  1 each am x2days and stop and f/u ov by weekend if not completely  better

## 2011-01-28 ENCOUNTER — Telehealth: Payer: Self-pay | Admitting: Critical Care Medicine

## 2011-01-28 DIAGNOSIS — J189 Pneumonia, unspecified organism: Secondary | ICD-10-CM

## 2011-01-28 NOTE — Telephone Encounter (Signed)
I spoke with the pt and he states he still has a productive cough but the phlegm is now clear, not green. He also states he still has increased SOB. He states the SOB has improved but is not back to baseline. He denies any fever, wheezing, chest tightness. The pt was given abx and pred taper on 01-14-11, then called back on 01-24-11 and was given an additional pred taper. He states he does have one tablet left on the second taper.  The pt is concerned that he still has symptoms and is requesting another antibiotic or an appt today. No appts available so I will forward message to TP to address. Please advise. Carron Curie, CMA Allergies  Allergen Reactions  . Moxifloxacin     REACTION: anaphylaxis  . Penicillins     REACTION: hives  . Sulfonamide Derivatives     REACTION: hives  . Timoptic     Causes severe chest congestion

## 2011-01-28 NOTE — Telephone Encounter (Signed)
Called spoke with patient, appt scheduled with PEW at [facility] office on 1.10.13 @ 1430 with cxr prior.  Gave pt [facility] address and phone number to call with questions on directions.  Order for cxr prior to appt placed.  Pt okay with this date/time/location.

## 2011-01-28 NOTE — Telephone Encounter (Signed)
Pt says he is doing a lot better but just not baseline yet.  Coughing clear mucus.  Was called in steroid pack last week. That helped a whole a lot. No wheezing now.  No fever. Good appetite. No n/v.   Will hold on abx for now.  Would like for him to be worked in next week with Dr. Delford Field  With cxr if at all possible.  He is fine with this.

## 2011-02-03 ENCOUNTER — Ambulatory Visit (HOSPITAL_BASED_OUTPATIENT_CLINIC_OR_DEPARTMENT_OTHER)
Admission: RE | Admit: 2011-02-03 | Discharge: 2011-02-03 | Disposition: A | Discharge status: Home / Self Care | Payer: Medicare Other | Source: Ambulatory Visit | Attending: Adult Health | Admitting: Adult Health

## 2011-02-03 ENCOUNTER — Encounter: Payer: Self-pay | Admitting: Critical Care Medicine

## 2011-02-03 ENCOUNTER — Ambulatory Visit: Payer: Medicare Other | Admitting: Adult Health

## 2011-02-03 ENCOUNTER — Ambulatory Visit (INDEPENDENT_AMBULATORY_CARE_PROVIDER_SITE_OTHER): Payer: Medicare Other | Admitting: Critical Care Medicine

## 2011-02-03 DIAGNOSIS — J9819 Other pulmonary collapse: Secondary | ICD-10-CM

## 2011-02-03 DIAGNOSIS — J189 Pneumonia, unspecified organism: Secondary | ICD-10-CM | POA: Insufficient documentation

## 2011-02-03 DIAGNOSIS — K449 Diaphragmatic hernia without obstruction or gangrene: Secondary | ICD-10-CM

## 2011-02-03 NOTE — Progress Notes (Signed)
Subjective:    Patient ID: Chad [name], male    DOB: Aug 06, 1929, 78 y.o.   MRN: [id number]  HPI 78 year old man with COPD primary emphysematous component.  Prior esophagectomy 2007 and abn CXR since that surgery.    07/21/10 Acute OV  78 y/o man presents for an acute office visit. Complains of wheezing, increased SOB, chest congestion x 3 days.  Cough is getting worse. High temps outside seem to be causing his breathing to be worse.   >>tx w/ doxycycline and pred   08/18/2010 Pt seen by NP last ov and rx Doxycycline 100mg  Twice daily For 7 days  Mucinex DM Twice daily As needed Cough/congestion  Prednisone taper over next week  Now is better.    09/30/10  He has notice chest congestion and increased cough for the past three days.  He is bringing up yellow phlegm.  He has notice more wheeze and chest tightness.  He has not had fever.  He does have more sinus congestion and sore throat.  He denies abdominal symptoms.  He denies recent sick exposures.  He has been using his proair more. >>rx doxcycline   9/27Acute OV  Pt c/o wheezing and tightness in chest x 1 wk- relates to being in a house with smokers for a wk. He has also had some prod cough with minimal green sputum. Got better but , cough returned 1 week ago. PT has multiple abx allergies, has taken cephalasporins in past without known difficulties.  No hemoptysis or chest pain. No edema.  OTC not working.   12/22/2010 Seen 9/12 Rx ABX and pred/omniceph and helped.  Now with pndrip and sore throat with this. Green mucus out of the nose.  Notes sinus pressure in eyes/cheeks. No cough.  Dyspnea is worse, no wheeze. >>Omnicef rx   12/21 Acute OV  Pt returns for an acute office visit. Complains of persistent cough. Seen 3 weeks ago with COPD flare , tx w/ Omnicef . Called back due to cough /congestion . Rx Biaxin sent . He still coughing up thick mucus. Does not have any energy and is now wheezing. No n/v/d . Appetite is fair.  No  hemotpysis.  CXR today shows RUL opacity.   02/03/2011 Was seen 12/21. Rx Avelox but is super allergic.  So not taken, and called in doxycycline and this improved the status. Now not much cough.  No chest pain.   Less dyspneic. Pt denies any significant sore throat, nasal congestion or excess secretions, fever, chills, sweats, unintended weight loss, pleurtic or exertional chest pain, orthopnea PND, or leg swelling Pt denies any increase in rescue therapy over baseline, denies waking up needing it or having any early am or nocturnal exacerbations of coughing/wheezing/or dyspnea. Pt also denies any obvious fluctuation in symptoms with  weather or environmental change or other alleviating or aggravating factors     Past Medical History  Diagnosis Date  . Fatty liver disease, nonalcoholic   . Coronary artery stenosis   . Diverticulosis of colon   . Diaphragmatic hernia without mention of obstruction or gangrene   . Dyspnea   . COPD (chronic obstructive pulmonary disease)   . Personal history of other malignant neoplasm of skin   . Barrett's esophagus   . Arthritis   . TIA (transient ischemic attack)   . Diabetes mellitus   . Hyperlipidemia   . Acid reflux disease   . Glaucoma   . HTN (hypertension)   . RENAL CALCULUS, HX OF 08/28/2008  .  CAROTID ARTERY STENOSIS, BILATERAL 08/27/2008     Family History  Problem Relation Age of Onset  . Colon cancer Mother   . Colon cancer Maternal Grandfather   . Lung cancer Mother   . Heart attack Mother   . Heart attack Brother   . Heart attack Brother   . Stomach cancer Father   . Esophageal cancer Father     Allergies  Allergen Reactions  . Moxifloxacin Anaphylaxis    REACTION: anaphylaxis  . Penicillins     REACTION: hives  . Sulfonamide Derivatives     REACTION: hives  . Timoptic     Causes severe chest congestion    Review of Systems Constitutional:   No  weight loss, night sweats,  Fevers, chills,  +fatigue, or   lassitude.  HEENT:   No headaches,  Difficulty swallowing,  Tooth/dental problems, or  Sore throat,                No sneezing, itching, ear ache,  =nasal congestion, post nasal drip,   CV:  No chest pain,  Orthopnea, PND, swelling in lower extremities, anasarca, dizziness, palpitations, syncope.   GI  No heartburn, indigestion, abdominal pain, nausea, vomiting, diarrhea, change in bowel habits, loss of appetite, bloody stools.   Resp:    No coughing up of blood.    No chest wall deformity  Skin: no rash or lesions.  GU: no dysuria, change in color of urine, no urgency or frequency.  No flank pain, no hematuria   MS:  No joint pain or swelling.  No decreased range of motion.  No back pain.  Psych:  No change in mood or affect. No depression or anxiety.  No memory loss.          Objective:   Physical Exam   BP 142/78  Pulse 86  Temp(Src) 98.5 F (36.9 C) (Oral)  Ht 5\' 10"  (1.778 m)  Wt 183 lb (83.008 kg)  BMI 26.26 kg/m2  SpO2 96%  General - speaks in full sentences, NAD  HEENT - No sinus tenderness, purulent  drainage, no oral exudate, no LAN Cardiac - s1s2 no murmur Chest - prolonged exhalation, clear no rales/dullness Abd - soft, nontender, normal bowel sounds Ext - no e/c/c Neuro - normal strength, A&O x 3, CN intact Psych - normal mood,behavior Skin - no rashes    Dg Chest 2 View  02/03/2011  *RADIOLOGY REPORT*  Clinical Data: Pneumonia.  CHEST - 2 VIEW  Comparison: Chest x-ray 01/14/2011.  Findings: The cardiac silhouette, mediastinal and hilar contours are within normal limits and stable.  Stable large hiatal hernia with the surrounding atelectasis.  Improved right upper lobe process with minimal residual atelectasis.  No pleural effusion or pneumothorax.  IMPRESSION:  1.  Improved right upper lobe process.  Minimal residual atelectasis. 2.  Large hiatal hernia with some surrounding atelectasis.  Original Report Authenticated By: P. Loralie Champagne, M.D.     Assessment & Plan:        Updated Medication List Outpatient Encounter Prescriptions as of 02/03/2011  Medication Sig Dispense Refill  . albuterol (PROAIR HFA) 108 (90 BASE) MCG/ACT inhaler Inhale 1-2 puffs into the lungs every 6 (six) hours as needed.        Marland Kitchen amLODipine (NORVASC) 5 MG tablet Take 5 mg by mouth daily.        Marland Kitchen aspirin 81 MG tablet Take 81 mg by mouth daily.        . beclomethasone (QVAR)  80 MCG/ACT inhaler Inhale 2 puffs into the lungs 2 (two) times daily.  1 Inhaler  6  . brimonidine (ALPHAGAN P) 0.1 % SOLN Place 1 drop into the left eye 2 (two) times daily.        . Cholecalciferol (VITAMIN D) 1000 UNITS capsule Take 1,000 Units by mouth daily.        . cloNIDine (CATAPRES) 0.2 MG tablet 1 tab by mouth as needed when BP is over 140       . diazepam (VALIUM) 5 MG tablet 1/2 tab by mouth once as daily as needed       . fluticasone (VERAMYST) 27.5 MCG/SPRAY nasal spray Place 2 sprays into the nose daily.  10 g  0  . formoterol (FORADIL) 12 MCG capsule for inhaler Place 12 mcg into inhaler and inhale 2 (two) times daily.        Marland Kitchen glimepiride (AMARYL) 2 MG tablet Take 2 mg by mouth daily.       Marland Kitchen losartan (COZAAR) 100 MG tablet Take 100 mg by mouth daily.        . metFORMIN (GLUCOPHAGE) 500 MG tablet Take 1,000 mg by mouth 2 (two) times daily with a meal.        . Multiple Vitamin (MULTIVITAMIN) capsule Take 1 capsule by mouth daily.        Marland Kitchen omeprazole (PRILOSEC) 20 MG capsule Take 40 mg by mouth daily.       . Potassium 99 MG TABS Take 1 tablet by mouth daily.        . promethazine (PHENERGAN) 25 MG tablet 1/2 - 1 once daily as needed      . saw palmetto 500 MG capsule Take 500 mg by mouth daily.        . simvastatin (ZOCOR) 80 MG tablet 1/2 tab my mouth once daily       . Tamsulosin HCl (FLOMAX) 0.4 MG CAPS Take 1 tablet by mouth 2 (two) times daily.       Marland Kitchen tiotropium (SPIRIVA) 18 MCG inhalation capsule Place 18 mcg into inhaler and inhale daily.        .  valACYclovir (VALTREX) 500 MG tablet Take 1,000 mg by mouth every 8 (eight) hours as needed.       . Zinc 100 MG TABS Take 1 tablet by mouth daily.        Marland Kitchen DISCONTD: vitamin E 400 UNIT capsule Take 400 Units by mouth daily.        Marland Kitchen DISCONTD: Cinnamon 500 MG capsule Take 500 mg by mouth daily.        Marland Kitchen DISCONTD: doxycycline (VIBRA-TABS) 100 MG tablet Take 2 tablets today, then one tablet daily until gone  8 tablet  0  . DISCONTD: glipiZIDE (GLUCOTROL XL) 2.5 MG 24 hr tablet Take 2.5 mg by mouth daily with breakfast.       . DISCONTD: milk thistle 175 MG tablet Take 175 mg by mouth daily.        Marland Kitchen DISCONTD: predniSONE (DELTASONE) 10 MG tablet Take 4 tabs each morning x2 days, 2 tabs x 2 days, then 1 tab x 2 days and stop.  14 tablet  0   Facility-Administered Encounter Medications as of 02/03/2011  Medication Dose Route Frequency Provider Last Rate Last Dose  . levalbuterol (XOPENEX) nebulizer solution 0.63 mg  0.63 mg Nebulization TID Rubye Oaks, NP   0.63 mg at 01/14/11 1146

## 2011-02-03 NOTE — Patient Instructions (Signed)
No change in medications. Return in          2 months [employer]

## 2011-02-03 NOTE — Assessment & Plan Note (Signed)
RUL PNA now improved with clearance on CXR 02/03/11 Plan No further ABX Need to avoid avelox in future d/t anaphylaxsis No change in inhaled or maintenance medications. Return in  2 months

## 2011-02-07 ENCOUNTER — Telehealth: Payer: Self-pay | Admitting: Critical Care Medicine

## 2011-02-07 MED ORDER — DOXYCYCLINE HYCLATE 100 MG PO CAPS: 100.0000 mg | ORAL_CAPSULE | Freq: Two times a day (BID) | ORAL | Status: AC

## 2011-02-07 NOTE — Telephone Encounter (Signed)
I spoke with spouse and she states pt c/o cough w/ lots of clear phlem, nasal congestion, wheezing, PND, low grade fever of 99.8 x Friday. Denies any chills, sweats, body aches, nausea, vomiting. Pt is taking tylenol and mucinex D BID. Wife is requesting recs from Dr. Delford Field. Please advise, thanks  Allergies  Allergen Reactions  . Moxifloxacin Anaphylaxis    REACTION: anaphylaxis  . Penicillins     REACTION: hives  . Sulfonamide Derivatives     REACTION: hives  . Timoptic     Causes severe chest congestion    [facility]

## 2011-02-07 NOTE — Telephone Encounter (Signed)
Call in another round of doxycycline 100mg  bid x 5 days  #10

## 2011-02-07 NOTE — Telephone Encounter (Signed)
Pt's spouse aware of doxycycline rx and will call for ov if sxs do not improve or pt will seek emergency help if sxs get worse.

## 2011-02-11 ENCOUNTER — Ambulatory Visit (INDEPENDENT_AMBULATORY_CARE_PROVIDER_SITE_OTHER): Payer: Medicare Other | Admitting: Pulmonary Disease

## 2011-02-11 ENCOUNTER — Telehealth: Payer: Self-pay | Admitting: Critical Care Medicine

## 2011-02-11 ENCOUNTER — Encounter: Payer: Self-pay | Admitting: Pulmonary Disease

## 2011-02-11 VITALS — BP 130/72 | HR 99 | Temp 98.5°F | Ht 70.0 in | Wt 186.6 lb

## 2011-02-11 DIAGNOSIS — J441 Chronic obstructive pulmonary disease with (acute) exacerbation: Secondary | ICD-10-CM | POA: Insufficient documentation

## 2011-02-11 MED ORDER — PREDNISONE 10 MG PO TABS: ORAL_TABLET | ORAL | Status: DC

## 2011-02-11 NOTE — Assessment & Plan Note (Signed)
The patient has had a recent episode of acute bronchitis, and this has improved with a course of antibiotics.  However, he continues to have significant cough and congestion with nonpurulent mucus, and is still short of breath.  I suspect he has ongoing airway inflammation that will benefit from a short prednisone taper.  He does have a few true wheezes on exam today.  I have also asked him to try sinus rinses since he has persistent sinus congestion as well.

## 2011-02-11 NOTE — Progress Notes (Signed)
  Subjective:    Patient ID: Chad [name], male    DOB: [birth year], 78 y.o.   MRN: [id number]  HPI Patient comes in today for an acute sick visit.  He has known COPD, and had a recent episode of pneumonia in December of last year with verified clearance by chest x-ray this year.  The beginning of this week he began to have increasing chest congestion with cough of purulent mucus.  He also had increasing shortness of breath.  The patient was treated with doxycycline, and states that his mucus has definitely become clear.  However, he has continued to have chest congestion and some increased shortness of breath.  He continues to cough up significant quantities of clear mucus.  He has not had any fevers, chills, or sweats.   Review of Systems  Constitutional: Negative for fever and unexpected weight change.  HENT: Positive for congestion and postnasal drip. Negative for ear pain, nosebleeds, sore throat, rhinorrhea, sneezing, trouble swallowing, dental problem and sinus pressure.   Eyes: Positive for itching. Negative for redness.  Respiratory: Positive for cough, chest tightness, shortness of breath and wheezing.   Cardiovascular: Negative for palpitations and leg swelling.  Gastrointestinal: Negative for nausea and vomiting.  Genitourinary: Negative for dysuria.  Musculoskeletal: Negative for joint swelling.  Skin: Negative for rash.  Neurological: Positive for headaches.  Hematological: Does not bruise/bleed easily.  Psychiatric/Behavioral: Negative for dysphoric mood. The patient is not nervous/anxious.        Objective:   Physical Exam Overweight male in nad Nose with mild erythema, no purulence OP without exudates Neck without LN Chest with prominent upper airway pseudo wheezing, and mild true wheezing as well.  Minimal basilar crackles. Cardiac exam with regular rate and rhythm Lower extremities with mild ankle edema, no cyanosis Alert and oriented, moves all 4  extremities.       Assessment & Plan:

## 2011-02-11 NOTE — Telephone Encounter (Signed)
Called, spoke with pt.  Doxy was called in on Monday.  Pt states he feels worse now than on Monday.  C/o coughing up a lot of very thick, clear phelgm, wheezing, and just not feeling good.  Also having PND and nasal congestion.  Denies fever, chills, sweets, body aches.  Will finish doxy tonight.  Still taking mucinex D bid.  OV scheduled for today at 1:30 PM with [facility] -- pt aware and was advised to seek emergency care if sxs worsen or feels he needs to be seen prior to this.  He verbalized understanding of this.

## 2011-02-11 NOTE — Patient Instructions (Signed)
Will hold off on further antibiotics since your mucus is now clear Will treat with a short course of prednisone to help airway inflammation and congestion Continue mucinex twice a day Can use neilmed sinus rinses to help with nasal congestion. Please call if you are not improving.

## 2011-04-05 ENCOUNTER — Encounter: Payer: Self-pay | Admitting: Critical Care Medicine

## 2011-04-05 ENCOUNTER — Ambulatory Visit (INDEPENDENT_AMBULATORY_CARE_PROVIDER_SITE_OTHER): Payer: Medicare Other | Admitting: Critical Care Medicine

## 2011-04-05 DIAGNOSIS — J449 Chronic obstructive pulmonary disease, unspecified: Secondary | ICD-10-CM

## 2011-04-05 NOTE — Progress Notes (Signed)
Subjective:    Patient ID: Chad [name], male    DOB: 01/28/1929, 78 y.o.   MRN: [id number]  HPI 78 year old man with COPD primary emphysematous component.  Prior esophagectomy 2007 and abn CXR since that surgery.    07/21/10 Acute OV  Pt presents for an acute office visit. Complains of wheezing, increased SOB, chest congestion x 3 days.  Cough is getting worse. High temps outside seem to be causing his breathing to be worse.   >>tx w/ doxycycline and pred   08/18/2010 Pt seen by NP last ov and rx Doxycycline 100mg  Twice daily For 7 days  Mucinex DM Twice daily As needed Cough/congestion  Prednisone taper over next week  Now is better.    09/30/10  He has notice chest congestion and increased cough for the past three days.  He is bringing up yellow phlegm.  He has notice more wheeze and chest tightness.  He has not had fever.  He does have more sinus congestion and sore throat.  He denies abdominal symptoms.  He denies recent sick exposures.  He has been using his proair more. >>rx doxcycline   9/27Acute OV  Pt c/o wheezing and tightness in chest x 1 wk- relates to being in a house with smokers for a wk. He has also had some prod cough with minimal green sputum. Got better but , cough returned 1 week ago. PT has multiple abx allergies, has taken cephalasporins in past without known difficulties.  No hemoptysis or chest pain. No edema.  OTC not working.   12/22/2010 Seen 9/12 Rx ABX and pred/omniceph and helped.  Now with pndrip and sore throat with this. Green mucus out of the nose.  Notes sinus pressure in eyes/cheeks. No cough.  Dyspnea is worse, no wheeze. >>Omnicef rx   12/21 Acute OV  Pt returns for an acute office visit. Complains of persistent cough. Seen 3 weeks ago with COPD flare , tx w/ Omnicef . Called back due to cough /congestion . Rx Biaxin sent . He still coughing up thick mucus. Does not have any energy and is now wheezing. No n/v/d . Appetite is fair.  No  hemotpysis.  CXR today shows RUL opacity.   1/10 Was seen 12/21. Rx Avelox but is super allergic.  So not taken, and called in doxycycline and this improved the status. Now not much cough.  No chest pain.   Less dyspneic. Pt denies any significant sore throat, nasal congestion or excess secretions, fever, chills, sweats, unintended weight loss, pleurtic or exertional chest pain, orthopnea PND, or leg swelling Pt denies any increase in rescue therapy over baseline, denies waking up needing it or having any early am or nocturnal exacerbations of coughing/wheezing/or dyspnea. Pt also denies any obvious fluctuation in symptoms with  weather or environmental change or other alleviating or aggravating factors  04/05/2011 Was seen as an acute 02/11/11 and Rx pred only. Now is better. Less cough and dyspnea.  Cannot tolerate avelox. Pt denies any significant sore throat, nasal congestion or excess secretions, fever, chills, sweats, unintended weight loss, pleurtic or exertional chest pain, orthopnea PND, or leg swelling Pt denies any increase in rescue therapy over baseline, denies waking up needing it or having any early am or nocturnal exacerbations of coughing/wheezing/or dyspnea. Pt also denies any obvious fluctuation in symptoms with  weather or environmental change or other alleviating or aggravating factors     Past Medical History  Diagnosis Date  . Fatty liver disease,  nonalcoholic   . Coronary artery stenosis   . Diverticulosis of colon   . Diaphragmatic hernia without mention of obstruction or gangrene   . Dyspnea   . COPD (chronic obstructive pulmonary disease)   . Personal history of other malignant neoplasm of skin   . Barrett's esophagus   . Arthritis   . TIA (transient ischemic attack)   . Diabetes mellitus   . Hyperlipidemia   . Acid reflux disease   . Glaucoma   . HTN (hypertension)   . RENAL CALCULUS, HX OF 08/28/2008  . CAROTID ARTERY STENOSIS, BILATERAL 08/27/2008      Family History  Problem Relation Age of Onset  . Colon cancer Mother   . Colon cancer Maternal Grandfather   . Lung cancer Mother   . Heart attack Mother   . Heart attack Brother   . Heart attack Brother   . Stomach cancer Father   . Esophageal cancer Father     Allergies  Allergen Reactions  . Moxifloxacin Anaphylaxis    Avelox REACTION: anaphylaxis  . Penicillins     REACTION: hives  . Sulfonamide Derivatives     REACTION: hives  . Timoptic     Causes severe chest congestion    Review of Systems Constitutional:   No  weight loss, night sweats,  Fevers, chills,  +fatigue, or  lassitude.  HEENT:   No headaches,  Difficulty swallowing,  Tooth/dental problems, or  Sore throat,                No sneezing, itching, ear ache,  =nasal congestion, post nasal drip,   CV:  No chest pain,  Orthopnea, PND, swelling in lower extremities, anasarca, dizziness, palpitations, syncope.   GI  No heartburn, indigestion, abdominal pain, nausea, vomiting, diarrhea, change in bowel habits, loss of appetite, bloody stools.   Resp:    No coughing up of blood.    No chest wall deformity  Skin: no rash or lesions.  GU: no dysuria, change in color of urine, no urgency or frequency.  No flank pain, no hematuria   MS:  No joint pain or swelling.  No decreased range of motion.  No back pain.  Psych:  No change in mood or affect. No depression or anxiety.  No memory loss.          Objective:   Physical Exam   BP 128/58  Pulse 101  Temp(Src) 97.8 F (36.6 C) (Oral)  Ht 5\' 9"  (1.753 m)  Wt 193 lb 9.6 oz (87.816 kg)  BMI 28.59 kg/m2  SpO2 95%  General - speaks in full sentences, NAD  HEENT - No sinus tenderness, purulent  drainage, no oral exudate, no LAN Cardiac - s1s2 no murmur Chest - prolonged exhalation, clear no rales/dullness Abd - soft, nontender, normal bowel sounds Ext - no e/c/c Neuro - normal strength, A&O x 3, CN intact Psych - normal mood,behavior Skin - no  rashes    No results found.  Assessment & Plan:        Updated Medication List Outpatient Encounter Prescriptions as of 04/05/2011  Medication Sig Dispense Refill  . albuterol (PROAIR HFA) 108 (90 BASE) MCG/ACT inhaler Inhale 1-2 puffs into the lungs every 6 (six) hours as needed.        Marland Kitchen amLODipine (NORVASC) 5 MG tablet Take 5 mg by mouth daily.        Marland Kitchen aspirin 81 MG tablet Take 81 mg by mouth daily.        Marland Kitchen  b complex vitamins tablet Take 1 tablet by mouth daily.      . beclomethasone (QVAR) 80 MCG/ACT inhaler Inhale 2 puffs into the lungs 2 (two) times daily.  1 Inhaler  6  . brimonidine (ALPHAGAN P) 0.1 % SOLN Place 1 drop into the left eye 2 (two) times daily.        . Cholecalciferol (VITAMIN D) 1000 UNITS capsule Take 1,000 Units by mouth daily.        . cloNIDine (CATAPRES) 0.2 MG tablet 1 tab by mouth as needed when BP is over 140       . diazepam (VALIUM) 5 MG tablet 1/2 tab by mouth once as daily as needed       . fluticasone (VERAMYST) 27.5 MCG/SPRAY nasal spray Place 2 sprays into the nose daily as needed.      . formoterol (FORADIL) 12 MCG capsule for inhaler Place 12 mcg into inhaler and inhale 2 (two) times daily.        Marland Kitchen glipiZIDE (GLUCOTROL XL) 2.5 MG 24 hr tablet Take 2.5 mg by mouth daily.      Marland Kitchen losartan (COZAAR) 100 MG tablet Take 100 mg by mouth daily.        . metFORMIN (GLUCOPHAGE) 500 MG tablet Take 1,000 mg by mouth 2 (two) times daily with a meal.        . Multiple Vitamin (MULTIVITAMIN) capsule Take 1 capsule by mouth daily.        Marland Kitchen omeprazole (PRILOSEC) 20 MG capsule Take 40 mg by mouth daily.       . Potassium 99 MG TABS Take 1 tablet by mouth daily.        . promethazine (PHENERGAN) 25 MG tablet 1/2 - 1 once daily as needed      . saw palmetto 500 MG capsule Take 500 mg by mouth daily.        . simvastatin (ZOCOR) 80 MG tablet 1/2 tab my mouth once daily       . Tamsulosin HCl (FLOMAX) 0.4 MG CAPS Take 1 tablet by mouth 2 (two) times daily.       Marland Kitchen  tiotropium (SPIRIVA) 18 MCG inhalation capsule Place 18 mcg into inhaler and inhale daily.        . valACYclovir (VALTREX) 500 MG tablet Take 1,000 mg by mouth every 8 (eight) hours as needed.       . Zinc 100 MG TABS Take 1 tablet by mouth daily.        Marland Kitchen DISCONTD: fluticasone (VERAMYST) 27.5 MCG/SPRAY nasal spray Place 2 sprays into the nose daily.  10 g  0  . DISCONTD: predniSONE (DELTASONE) 10 MG tablet Take 4 tabs daily x 2 days, then 3 tabs daily x 2 days, then 2 tabs daily x 2 days, then 1 tab daily x 2 days, then stop.  20 tablet  0   Facility-Administered Encounter Medications as of 04/05/2011  Medication Dose Route Frequency Provider Last Rate Last Dose  . levalbuterol (XOPENEX) nebulizer solution 0.63 mg  0.63 mg Nebulization TID Julio Sicks, NP   0.63 mg at 01/14/11 1146

## 2011-04-05 NOTE — Assessment & Plan Note (Signed)
Stable Copd with AB flare resolved Plan No change in inhaled or maintenance medications. Return in  3 months

## 2011-04-05 NOTE — Patient Instructions (Signed)
No change in medications. Return in        2 months 

## 2011-04-15 ENCOUNTER — Ambulatory Visit (INDEPENDENT_AMBULATORY_CARE_PROVIDER_SITE_OTHER): Payer: Medicare Other | Admitting: Emergency Medicine

## 2011-04-15 VITALS — BP 144/77 | HR 85 | Temp 98.9°F | Resp 18 | Ht 68.5 in | Wt 189.0 lb

## 2011-04-15 DIAGNOSIS — J329 Chronic sinusitis, unspecified: Secondary | ICD-10-CM

## 2011-04-15 DIAGNOSIS — J019 Acute sinusitis, unspecified: Secondary | ICD-10-CM

## 2011-04-15 MED ORDER — DOXYCYCLINE HYCLATE 100 MG PO TABS: 100.0000 mg | ORAL_TABLET | Freq: Two times a day (BID) | ORAL | Status: AC

## 2011-04-15 NOTE — Patient Instructions (Signed)

## 2011-04-15 NOTE — Progress Notes (Signed)
  Subjective:    Patient ID: Chad [name], male    DOB: [birth year], 78 y.o.   MRN: [id number]  HPI patient is a 3-4 day history of sore throat. He has had a purulent drainage from the left side of his nose. He's also had some pain behind his left ear. He has not had a cough or sore throat    Review of Systems is pertinent in that the patient wears a binder. He has a large ventral hernia from his previous surgery where he had removal of his esophagus.     Objective:   Physical Exam  Constitutional: He appears well-developed.  HENT:  Right Ear: External ear normal.  Left Ear: External ear normal.       A large area of purulence and crusting in the left nasopharynx.  Eyes: Pupils are equal, round, and reactive to light.  Neck: Neck supple. No JVD present. No tracheal deviation present. No thyromegaly present.  Cardiovascular: Normal rate and regular rhythm.   Pulmonary/Chest: He has no wheezes. He has no rales.  Lymphadenopathy:    He has no cervical adenopathy.          Assessment & Plan:  Patient has a left maxillary sinusitis

## 2011-05-09 DIAGNOSIS — H35349 Macular cyst, hole, or pseudohole, unspecified eye: Secondary | ICD-10-CM | POA: Insufficient documentation

## 2011-06-21 ENCOUNTER — Ambulatory Visit (INDEPENDENT_AMBULATORY_CARE_PROVIDER_SITE_OTHER): Payer: Medicare Other | Admitting: Critical Care Medicine

## 2011-06-21 ENCOUNTER — Encounter: Payer: Self-pay | Admitting: Critical Care Medicine

## 2011-06-21 VITALS — BP 118/62 | HR 90 | Temp 97.9°F | Ht 71.0 in | Wt 192.2 lb

## 2011-06-21 DIAGNOSIS — J449 Chronic obstructive pulmonary disease, unspecified: Secondary | ICD-10-CM

## 2011-06-21 NOTE — Assessment & Plan Note (Signed)
Gold stage C. COPD with frequent exacerbations with acute bronchitis and chronic bronchitis Stable at this time The patient has previously not tolerated daliresp  therefore is not on this medication at this time Plan Continue inhaled steroid and long-acting bronchodilator Return 3 months

## 2011-06-21 NOTE — Patient Instructions (Signed)
No change in medications. Return in   3 months 

## 2011-06-21 NOTE — Progress Notes (Signed)
Subjective:    Patient ID: Chad [name], male    DOB: 12/28/1929, 78 y.o.   MRN: [id number]  HPI 78 y.o. man with COPD primary emphysematous component.  Prior esophagectomy 2007 and abn CXR since that surgery.    1/10 Was seen 12/21. Rx Avelox but is super allergic.  So not taken, and called in doxycycline and this improved the status. Now not much cough.  No chest pain.   Less dyspneic. Pt denies any significant sore throat, nasal congestion or excess secretions, fever, chills, sweats, unintended weight loss, pleurtic or exertional chest pain, orthopnea PND, or leg swelling Pt denies any increase in rescue therapy over baseline, denies waking up needing it or having any early am or nocturnal exacerbations of coughing/wheezing/or dyspnea. Pt also denies any obvious fluctuation in symptoms with  weather or environmental change or other alleviating or aggravating factors  3/12 Was seen as an acute 02/11/11 and Rx pred only. Now is better. Less cough and dyspnea.  Cannot tolerate avelox. Pt denies any significant sore throat, nasal congestion or excess secretions, fever, chills, sweats, unintended weight loss, pleurtic or exertional chest pain, orthopnea PND, or leg swelling Pt denies any increase in rescue therapy over baseline, denies waking up needing it or having any early am or nocturnal exacerbations of coughing/wheezing/or dyspnea. Pt also denies any obvious fluctuation in symptoms with  weather or environmental change or other alleviating or aggravating factors   06/21/2011 DOing well.  No new issues.  No chest pain. No mucus. Pt denies any significant sore throat, nasal congestion or excess secretions, fever, chills, sweats, unintended weight loss, pleurtic or exertional chest pain, orthopnea PND, or leg swelling Pt denies any increase in rescue therapy over baseline, denies waking up needing it or having any early am or nocturnal exacerbations of coughing/wheezing/or dyspnea. Pt  also denies any obvious fluctuation in symptoms with  weather or environmental change or other alleviating or aggravating factors       Past Medical History  Diagnosis Date  . Fatty liver disease, nonalcoholic   . Coronary artery stenosis   . Diverticulosis of colon   . Diaphragmatic hernia without mention of obstruction or gangrene   . Dyspnea   . COPD (chronic obstructive pulmonary disease)   . Personal history of other malignant neoplasm of skin   . Barrett's esophagus   . Arthritis   . TIA (transient ischemic attack)   . Diabetes mellitus   . Hyperlipidemia   . Acid reflux disease   . Glaucoma   . HTN (hypertension)   . RENAL CALCULUS, HX OF 08/28/2008  . CAROTID ARTERY STENOSIS, BILATERAL 08/27/2008     Family History  Problem Relation Age of Onset  . Colon cancer Mother   . Colon cancer Maternal Grandfather   . Lung cancer Mother   . Heart attack Mother   . Heart attack Brother   . Heart attack Brother   . Stomach cancer Father   . Esophageal cancer Father     Allergies  Allergen Reactions  . Moxifloxacin Anaphylaxis    Avelox REACTION: anaphylaxis  . Penicillins     REACTION: hives  . Sulfonamide Derivatives     REACTION: hives  . Timolol Maleate     Causes severe chest congestion    Review of Systems Constitutional:   No  weight loss, night sweats,  Fevers, chills,  No fatigue, or  lassitude.  HEENT:   No headaches,  Difficulty swallowing,  Tooth/dental problems, or  Sore throat,                No sneezing, itching, ear ache,  No nasal congestion, post nasal drip,   CV:  No chest pain,  Orthopnea, PND, swelling in lower extremities, anasarca, dizziness, palpitations, syncope.   GI  No heartburn, indigestion, abdominal pain, nausea, vomiting, diarrhea, change in bowel habits, loss of appetite, bloody stools.   Resp:    No coughing up of blood.    No chest wall deformity  Skin: no rash or lesions.  GU: no dysuria, change in color of urine, no  urgency or frequency.  No flank pain, no hematuria   MS:  No joint pain or swelling.  No decreased range of motion.  No back pain.  Psych:  No change in mood or affect. No depression or anxiety.  No memory loss.          Objective:   Physical Exam   BP 118/62  Pulse 90  Temp(Src) 97.9 F (36.6 C) (Oral)  Ht 5\' 11"  (1.803 m)  Wt 192 lb 3.2 oz (87.181 kg)  BMI 26.81 kg/m2  SpO2 96%  General - speaks in full sentences, NAD  HEENT - No sinus tenderness, purulent  drainage, no oral exudate, no LAN Cardiac - s1s2 no murmur Chest - prolonged exhalation, clear no rales/dullness Abd - soft, nontender, normal bowel sounds Ext - no e/c/c Neuro - normal strength, A&O x 3, CN intact Psych - normal mood,behavior Skin - no rashes    No results found.  Assessment & Plan:      C O P D Gold stage C. COPD with frequent exacerbations with acute bronchitis and chronic bronchitis Stable at this time The patient has previously not tolerated daliresp  therefore is not on this medication at this time Plan Continue inhaled steroid and long-acting bronchodilator Return 3 months     Updated Medication List Outpatient Encounter Prescriptions as of 06/21/2011  Medication Sig Dispense Refill  . albuterol (PROAIR HFA) 108 (90 BASE) MCG/ACT inhaler Inhale 1-2 puffs into the lungs every 6 (six) hours as needed.        Marland Kitchen amLODipine (NORVASC) 5 MG tablet Take 5 mg by mouth daily.        Marland Kitchen aspirin 81 MG tablet Take 81 mg by mouth daily.        Marland Kitchen b complex vitamins tablet Take 1 tablet by mouth daily.      . beclomethasone (QVAR) 80 MCG/ACT inhaler Inhale 2 puffs into the lungs 2 (two) times daily.  1 Inhaler  6  . brimonidine (ALPHAGAN P) 0.1 % SOLN Place 1 drop into the left eye 2 (two) times daily.        . Cholecalciferol (VITAMIN D) 1000 UNITS capsule Take 1,000 Units by mouth daily.        . cloNIDine (CATAPRES) 0.2 MG tablet 1 tab by mouth as needed when BP is over 140       . diazepam  (VALIUM) 5 MG tablet 1/2 tab by mouth once as daily as needed       . fluticasone (VERAMYST) 27.5 MCG/SPRAY nasal spray Place 2 sprays into the nose daily as needed.      . formoterol (FORADIL) 12 MCG capsule for inhaler Place 12 mcg into inhaler and inhale 2 (two) times daily.        Marland Kitchen glipiZIDE (GLUCOTROL XL) 2.5 MG 24 hr tablet Take 2.5 mg by mouth daily.      Marland Kitchen  losartan (COZAAR) 100 MG tablet Take 100 mg by mouth daily.        . metFORMIN (GLUCOPHAGE) 500 MG tablet Take 1,000 mg by mouth 2 (two) times daily with a meal.        . Multiple Vitamin (MULTIVITAMIN) capsule Take 1 capsule by mouth daily.        Marland Kitchen omeprazole (PRILOSEC) 20 MG capsule Take 40 mg by mouth daily.       . Potassium 99 MG TABS Take 1 tablet by mouth daily.        . promethazine (PHENERGAN) 25 MG tablet 1/2 - 1 once daily as needed      . saw palmetto 500 MG capsule Take 500 mg by mouth daily.        . simvastatin (ZOCOR) 80 MG tablet 1/2 tab my mouth once daily       . Tamsulosin HCl (FLOMAX) 0.4 MG CAPS Take 1 tablet by mouth 2 (two) times daily.       Marland Kitchen tiotropium (SPIRIVA) 18 MCG inhalation capsule Place 18 mcg into inhaler and inhale daily.        . valACYclovir (VALTREX) 500 MG tablet Take 1,000 mg by mouth every 8 (eight) hours as needed.       . Zinc 100 MG TABS Take 1 tablet by mouth daily.         Facility-Administered Encounter Medications as of 06/21/2011  Medication Dose Route Frequency Provider Last Rate Last Dose  . DISCONTD: levalbuterol (XOPENEX) nebulizer solution 0.63 mg  0.63 mg Nebulization TID Julio Sicks, NP   0.63 mg at 01/14/11 1146

## 2011-09-21 ENCOUNTER — Encounter: Payer: Self-pay | Admitting: Critical Care Medicine

## 2011-09-21 ENCOUNTER — Ambulatory Visit (INDEPENDENT_AMBULATORY_CARE_PROVIDER_SITE_OTHER): Payer: Medicare Other | Admitting: Critical Care Medicine

## 2011-09-21 VITALS — BP 158/80 | HR 80 | Temp 98.5°F | Ht 69.0 in | Wt 190.2 lb

## 2011-09-21 DIAGNOSIS — J449 Chronic obstructive pulmonary disease, unspecified: Secondary | ICD-10-CM

## 2011-09-21 NOTE — Assessment & Plan Note (Signed)
Gold stage C. COPD with frequent exacerbations Current flare due to to recurrent sinusitis Plan Finish current course of clindamycin for current sinusitis Nasal hygiene Maintain inhaled medications as prescribed

## 2011-09-21 NOTE — Patient Instructions (Addendum)
Finish cleocin Use NeilMed sinus rinse once daily for 7days No other medication changes Return 3 months

## 2011-09-21 NOTE — Progress Notes (Signed)
Subjective:    Patient ID: Chad [name], male    DOB: [birth year], 78 y.o.   MRN: [id number]  HPI 78 y.o. man with COPD primary emphysematous component.  Prior esophagectomy 2007 and abn CXR since that surgery.     06/21/2011 DOing well.  No new issues.  No chest pain. No mucus. Pt denies any significant sore throat, nasal congestion or excess secretions, fever, chills, sweats, unintended weight loss, pleurtic or exertional chest pain, orthopnea PND, or leg swelling Pt denies any increase in rescue therapy over baseline, denies waking up needing it or having any early am or nocturnal exacerbations of coughing/wheezing/or dyspnea. Pt also denies any obvious fluctuation in symptoms with  weather or environmental change or other alleviating or aggravating factors   09/21/2011 Pt c/o PND and sore throat.  No dyspnea. No mucus. Pt denies any significant sore throat, nasal congestion or excess secretions, fever, chills, sweats, unintended weight loss, pleurtic or exertional chest pain, orthopnea PND, or leg swelling Pt denies any increase in rescue therapy over baseline, denies waking up needing it or having any early am or nocturnal exacerbations of coughing/wheezing/or dyspnea. Pt also denies any obvious fluctuation in symptoms with  weather or environmental change or other alleviating or aggravating factors       Past Medical History  Diagnosis Date  . Fatty liver disease, nonalcoholic   . Coronary artery stenosis   . Diverticulosis of colon   . Diaphragmatic hernia without mention of obstruction or gangrene   . Dyspnea   . COPD (chronic obstructive pulmonary disease)   . Personal history of other malignant neoplasm of skin   . Barrett's esophagus   . Arthritis   . TIA (transient ischemic attack)   . Diabetes mellitus   . Hyperlipidemia   . Acid reflux disease   . Glaucoma   . HTN (hypertension)   . RENAL CALCULUS, HX OF 08/28/2008  . CAROTID ARTERY STENOSIS, BILATERAL  08/27/2008     Family History  Problem Relation Age of Onset  . Colon cancer Mother   . Colon cancer Maternal Grandfather   . Lung cancer Mother   . Heart attack Mother   . Heart attack Brother   . Heart attack Brother   . Stomach cancer Father   . Esophageal cancer Father     Allergies  Allergen Reactions  . Moxifloxacin Anaphylaxis    Avelox REACTION: anaphylaxis  . Penicillins     REACTION: hives  . Sulfonamide Derivatives     REACTION: hives  . Timolol Maleate     Causes severe chest congestion    Review of Systems Constitutional:   No  weight loss, night sweats,  Fevers, chills,  No fatigue, or  lassitude.  HEENT:   No headaches,  Difficulty swallowing,  Tooth/dental problems, or  Sore throat,                No sneezing, itching, ear ache,  ++ nasal congestion, ++post nasal drip,   CV:  No chest pain,  Orthopnea, PND, swelling in lower extremities, anasarca, dizziness, palpitations, syncope.   GI  No heartburn, indigestion, abdominal pain, nausea, vomiting, diarrhea, change in bowel habits, loss of appetite, bloody stools.   Resp:    No coughing up of blood.    No chest wall deformity  Skin: no rash or lesions.  GU: no dysuria, change in color of urine, no urgency or frequency.  No flank pain, no hematuria   MS:  No joint  pain or swelling.  No decreased range of motion.  No back pain.  Psych:  No change in mood or affect. No depression or anxiety.  No memory loss.          Objective:   Physical Exam   BP 158/80  Pulse 80  Temp 98.5 F (36.9 C) (Oral)  Ht 5\' 9"  (1.753 m)  Wt 190 lb 3.2 oz (86.274 kg)  BMI 28.09 kg/m2  SpO2 98%  General - speaks in full sentences, NAD  HEENT - ++ sinus tenderness, ++purulent  drainage, no oral exudate, no LAN Cardiac - s1s2 no murmur Chest - prolonged exhalation, clear no rales/dullness Abd - soft, nontender, normal bowel sounds Ext - no e/c/c Neuro - normal strength, A&O x 3, CN intact Psych - normal  mood,behavior Skin - no rashes    No results found.  Assessment & Plan:      C O P D Gold stage C. COPD with frequent exacerbations Current flare due to to recurrent sinusitis Plan Finish current course of clindamycin for current sinusitis Nasal hygiene Maintain inhaled medications as prescribed    Updated Medication List Outpatient Encounter Prescriptions as of 09/21/2011  Medication Sig Dispense Refill  . albuterol (PROAIR HFA) 108 (90 BASE) MCG/ACT inhaler Inhale 1-2 puffs into the lungs every 6 (six) hours as needed.        Marland Kitchen amLODipine (NORVASC) 5 MG tablet Take 5 mg by mouth daily.        Marland Kitchen aspirin 81 MG tablet Take 81 mg by mouth daily.        Marland Kitchen b complex vitamins tablet Take 1 tablet by mouth daily.      . beclomethasone (QVAR) 80 MCG/ACT inhaler Inhale 2 puffs into the lungs 2 (two) times daily.  1 Inhaler  6  . brimonidine (ALPHAGAN P) 0.1 % SOLN Place 1 drop into the left eye 2 (two) times daily.        . Cholecalciferol (VITAMIN D) 1000 UNITS capsule Take 1,000 Units by mouth daily.        . clindamycin (CLEOCIN) 300 MG capsule Take 1 capsule by mouth 4 times daily.      . cloNIDine (CATAPRES) 0.2 MG tablet 1 tab by mouth as needed when BP is over 140       . diazepam (VALIUM) 5 MG tablet 1/2 tab by mouth once as daily as needed       . fluticasone (VERAMYST) 27.5 MCG/SPRAY nasal spray Place 2 sprays into the nose daily as needed.      . formoterol (FORADIL) 12 MCG capsule for inhaler Place 12 mcg into inhaler and inhale 2 (two) times daily.        Marland Kitchen glipiZIDE (GLUCOTROL XL) 2.5 MG 24 hr tablet Take 2.5 mg by mouth daily.      Marland Kitchen HYDROcodone-ibuprofen (VICOPROFEN) 7.5-200 MG per tablet Take 1 tablet by mouth as needed.      Marland Kitchen losartan (COZAAR) 100 MG tablet Take 100 mg by mouth daily.        . metFORMIN (GLUCOPHAGE) 500 MG tablet Take 1,000 mg by mouth 2 (two) times daily with a meal.        . Multiple Vitamin (MULTIVITAMIN) capsule Take 1 capsule by mouth daily.          Marland Kitchen omeprazole (PRILOSEC) 20 MG capsule Take 40 mg by mouth daily.       . Potassium 99 MG TABS Take 1 tablet by mouth daily.        Marland Kitchen  promethazine (PHENERGAN) 25 MG tablet 1/2 - 1 once daily as needed      . saw palmetto 500 MG capsule Take 500 mg by mouth daily.        . simvastatin (ZOCOR) 80 MG tablet 1/2 tab my mouth once daily       . Tamsulosin HCl (FLOMAX) 0.4 MG CAPS Take 1 tablet by mouth 2 (two) times daily.       Marland Kitchen tiotropium (SPIRIVA) 18 MCG inhalation capsule Place 18 mcg into inhaler and inhale daily.        . valACYclovir (VALTREX) 500 MG tablet Take 1,000 mg by mouth every 8 (eight) hours as needed.       . Zinc 100 MG TABS Take 1 tablet by mouth daily.

## 2011-09-24 ENCOUNTER — Ambulatory Visit (INDEPENDENT_AMBULATORY_CARE_PROVIDER_SITE_OTHER): Payer: Medicare Other | Admitting: Emergency Medicine

## 2011-09-24 VITALS — BP 130/60 | HR 82 | Temp 99.1°F | Resp 18 | Wt 189.0 lb

## 2011-09-24 DIAGNOSIS — J018 Other acute sinusitis: Secondary | ICD-10-CM

## 2011-09-24 MED ORDER — PSEUDOEPHEDRINE-GUAIFENESIN ER 60-600 MG PO TB12: 1.0000 | ORAL_TABLET | Freq: Two times a day (BID) | ORAL | Status: AC

## 2011-09-24 MED ORDER — CEFPROZIL 500 MG PO TABS: 500.0000 mg | ORAL_TABLET | Freq: Two times a day (BID) | ORAL | Status: AC

## 2011-09-24 NOTE — Progress Notes (Signed)
Date:  09/24/2011   Name:  Chad [name]   DOB:  06-25-1929   MRN:  [id number] Gender: male Age: 78 y.o.  PCP:  Lorenda Peck, MD    Chief Complaint: Sinusitis   History of Present Illness:  Chad [name] is a 78 y.o. pleasant patient who presents with the following:  Ill with sinus congestion and post nasal drainage for two weeks.  Has sore throat that makes it hard to swallow and pain and pressure in ears.  Has been taking clindamycin prescribed by his dentist for it that was left over.  No improvement.  Claims fever with no measured temperature.  Has cough that is not productive and malaise and fatigue.  Patient Active Problem List  Diagnosis  . COLONIC POLYPS  . DM  . HYPERLIPIDEMIA  . GLAUCOMA  . HYPERTENSION  . C O P D  . BARRETTS ESOPHAGUS  . HIATAL HERNIA WITH REFLUX  . DIVERTICULOSIS, COLON  . FATTY LIVER DISEASE  . ARTHRITIS    Past Medical History  Diagnosis Date  . Fatty liver disease, nonalcoholic   . Coronary artery stenosis   . Diverticulosis of colon   . Diaphragmatic hernia without mention of obstruction or gangrene   . Dyspnea   . COPD (chronic obstructive pulmonary disease)   . Personal history of other malignant neoplasm of skin   . Barrett's esophagus   . Arthritis   . TIA (transient ischemic attack)   . Diabetes mellitus   . Hyperlipidemia   . Acid reflux disease   . Glaucoma   . HTN (hypertension)   . RENAL CALCULUS, HX OF 08/28/2008  . CAROTID ARTERY STENOSIS, BILATERAL 08/27/2008    Past Surgical History  Procedure Date  . Esophagectomy 2007    with stomach pull through  . Eye surgery   . Ventral hernia repair 2008  . Ortho knee both knees   . Carotid artery angioplasty     History  Substance Use Topics  . Smoking status: Former Smoker -- 1.0 packs/day for 40 years    Types: Cigarettes    Quit date: 01/24/1978  . Smokeless tobacco: Never Used  . Alcohol Use: No    Family History  Problem Relation Age of  Onset  . Colon cancer Mother   . Colon cancer Maternal Grandfather   . Lung cancer Mother   . Heart attack Mother   . Heart attack Brother   . Heart attack Brother   . Stomach cancer Father   . Esophageal cancer Father     Allergies  Allergen Reactions  . Moxifloxacin Anaphylaxis    Avelox REACTION: anaphylaxis  . Penicillins     REACTION: hives  . Sulfonamide Derivatives     REACTION: hives  . Timolol Maleate     Causes severe chest congestion    Medication list has been reviewed and updated.  Current Outpatient Prescriptions on File Prior to Visit  Medication Sig Dispense Refill  . aspirin 81 MG tablet Take 81 mg by mouth daily.        Marland Kitchen b complex vitamins tablet Take 1 tablet by mouth daily.      . beclomethasone (QVAR) 80 MCG/ACT inhaler Inhale 2 puffs into the lungs 2 (two) times daily.  1 Inhaler  6  . brimonidine (ALPHAGAN P) 0.1 % SOLN Place 1 drop into the left eye 2 (two) times daily.        . Cholecalciferol (VITAMIN D) 1000 UNITS capsule Take  1,000 Units by mouth daily.        . clindamycin (CLEOCIN) 300 MG capsule Take 1 capsule by mouth 4 times daily.      . cloNIDine (CATAPRES) 0.2 MG tablet 1 tab by mouth as needed when BP is over 140       . diazepam (VALIUM) 5 MG tablet 1/2 tab by mouth once as daily as needed       . fluticasone (VERAMYST) 27.5 MCG/SPRAY nasal spray Place 2 sprays into the nose daily as needed.      . formoterol (FORADIL) 12 MCG capsule for inhaler Place 12 mcg into inhaler and inhale 2 (two) times daily.        Marland Kitchen glipiZIDE (GLUCOTROL XL) 2.5 MG 24 hr tablet Take 2.5 mg by mouth daily.      Marland Kitchen HYDROcodone-ibuprofen (VICOPROFEN) 7.5-200 MG per tablet Take 1 tablet by mouth as needed.      Marland Kitchen losartan (COZAAR) 100 MG tablet Take 100 mg by mouth daily.        . metFORMIN (GLUCOPHAGE) 500 MG tablet Take 1,000 mg by mouth 2 (two) times daily with a meal.        . Multiple Vitamin (MULTIVITAMIN) capsule Take 1 capsule by mouth daily.        Marland Kitchen  omeprazole (PRILOSEC) 20 MG capsule Take 40 mg by mouth daily.       . Potassium 99 MG TABS Take 1 tablet by mouth daily.        . promethazine (PHENERGAN) 25 MG tablet 1/2 - 1 once daily as needed      . saw palmetto 500 MG capsule Take 500 mg by mouth daily.        . simvastatin (ZOCOR) 80 MG tablet 1/2 tab my mouth once daily       . Tamsulosin HCl (FLOMAX) 0.4 MG CAPS Take 1 tablet by mouth 2 (two) times daily.       Marland Kitchen tiotropium (SPIRIVA) 18 MCG inhalation capsule Place 18 mcg into inhaler and inhale daily.        . valACYclovir (VALTREX) 500 MG tablet Take 1,000 mg by mouth every 8 (eight) hours as needed.       . Zinc 100 MG TABS Take 1 tablet by mouth daily.        Marland Kitchen albuterol (PROAIR HFA) 108 (90 BASE) MCG/ACT inhaler Inhale 1-2 puffs into the lungs every 6 (six) hours as needed.        Marland Kitchen amLODipine (NORVASC) 5 MG tablet Take 5 mg by mouth daily.          Review of Systems:  As per HPI, otherwise negative.    Physical Examination: Filed Vitals:   09/24/11 0757  BP: 130/60  Pulse: 82  Temp: 99.1 F (37.3 C)  Resp: 18   Filed Vitals:   09/24/11 0757  Weight: 189 lb (85.73 kg)   There is no height on file to calculate BMI. Ideal Body Weight:     GEN: WDWN, NAD, Non-toxic, Alert & Oriented x 3 HEENT: Atraumatic, Normocephalic.  TM negative.  Tender over both maxillary sinuses.  Green posterior pharyngeal wall discharge Ears and Nose: No external deformity. Green nasal discharge EXTR: No clubbing/cyanosis/edema NEURO: Normal gait.  PSYCH: Normally interactive. Conversant. Not depressed or anxious appearing.  Calm demeanor.  Chest CTA BS=  Assessment and Plan: Sinusitis cefzil (very remote allergy to penicillin.  Has had since and takes cephalosporins) mucinex Follow up as needed  Dareen Piano,  Janalee Dane, MD

## 2011-10-04 ENCOUNTER — Telehealth: Payer: Self-pay | Admitting: Gastroenterology

## 2011-10-04 NOTE — Telephone Encounter (Signed)
I have reviewed his records from [facility], and he had a total esophagectomy performed there. He needs to be followed up at a Medical Center such as [facility] or [place]. I do not think that I can personally help this patient with this problem. This is a very complicated situation, and if he is not happy with [facility], we can schedule him to see Dr. Art Buff at [facility].

## 2011-10-04 NOTE — Telephone Encounter (Signed)
Informed wife that Dr Jarold Motto feels pt has a very complicated situation and he feels he should f/u at a major medical center. Dr Jarold Motto will refer pt to [facility] if he does not want to go back to [facility].  She will ask wife and call me back.

## 2011-10-04 NOTE — Telephone Encounter (Signed)
Pt's wife reports he's having trouble swallowing and she wonders if Dr Jarold Motto could stretch his esophagus. Last seen 08/28/08 to discuss possible ECL and to discuss hernia repair. He was advised against the hernia repair and he was to do stool cards. Pt has a hx of Esophagectomy with multiple complications at [facility] for microscopic adenocarcinoma. Dr Jarold Motto, please advise; OV or direct EGD? Thanks.

## 2011-10-07 ENCOUNTER — Encounter (HOSPITAL_COMMUNITY): Payer: Self-pay | Admitting: Emergency Medicine

## 2011-10-07 DIAGNOSIS — T18108A Unspecified foreign body in esophagus causing other injury, initial encounter: Secondary | ICD-10-CM | POA: Insufficient documentation

## 2011-10-07 DIAGNOSIS — Z801 Family history of malignant neoplasm of trachea, bronchus and lung: Secondary | ICD-10-CM | POA: Insufficient documentation

## 2011-10-07 DIAGNOSIS — I10 Essential (primary) hypertension: Secondary | ICD-10-CM | POA: Insufficient documentation

## 2011-10-07 DIAGNOSIS — Z882 Allergy status to sulfonamides status: Secondary | ICD-10-CM | POA: Insufficient documentation

## 2011-10-07 DIAGNOSIS — IMO0002 Reserved for concepts with insufficient information to code with codable children: Secondary | ICD-10-CM | POA: Insufficient documentation

## 2011-10-07 DIAGNOSIS — Z8249 Family history of ischemic heart disease and other diseases of the circulatory system: Secondary | ICD-10-CM | POA: Insufficient documentation

## 2011-10-07 DIAGNOSIS — Z881 Allergy status to other antibiotic agents status: Secondary | ICD-10-CM | POA: Insufficient documentation

## 2011-10-07 DIAGNOSIS — E119 Type 2 diabetes mellitus without complications: Secondary | ICD-10-CM | POA: Insufficient documentation

## 2011-10-07 DIAGNOSIS — Z87891 Personal history of nicotine dependence: Secondary | ICD-10-CM | POA: Insufficient documentation

## 2011-10-07 DIAGNOSIS — K219 Gastro-esophageal reflux disease without esophagitis: Secondary | ICD-10-CM | POA: Insufficient documentation

## 2011-10-07 DIAGNOSIS — Z880 Allergy status to penicillin: Secondary | ICD-10-CM | POA: Insufficient documentation

## 2011-10-07 DIAGNOSIS — Z800 Family history of malignant neoplasm of digestive organs: Secondary | ICD-10-CM | POA: Insufficient documentation

## 2011-10-07 DIAGNOSIS — I2510 Atherosclerotic heart disease of native coronary artery without angina pectoris: Secondary | ICD-10-CM | POA: Insufficient documentation

## 2011-10-07 DIAGNOSIS — Z888 Allergy status to other drugs, medicaments and biological substances status: Secondary | ICD-10-CM | POA: Insufficient documentation

## 2011-10-07 NOTE — ED Notes (Addendum)
Reports ate supper at 7pm, eating steak--piece got stuck in throat, and has not been able to cough up or get it to go down; pt reports he is not able to drink anything, but is able to handle his saliva; no drooling or spitting at triage; pt able to talk in full sentences; pt denies difficulty breathing

## 2011-10-08 ENCOUNTER — Emergency Department (HOSPITAL_COMMUNITY): Payer: Medicare Other

## 2011-10-08 ENCOUNTER — Emergency Department (HOSPITAL_COMMUNITY)
Admission: EM | Admit: 2011-10-08 | Discharge: 2011-10-08 | Disposition: A | Discharge status: Home / Self Care | Payer: Medicare Other | Attending: Emergency Medicine | Admitting: Emergency Medicine

## 2011-10-08 DIAGNOSIS — I10 Essential (primary) hypertension: Secondary | ICD-10-CM

## 2011-10-08 DIAGNOSIS — T18128A Food in esophagus causing other injury, initial encounter: Secondary | ICD-10-CM

## 2011-10-08 LAB — CBC WITH DIFFERENTIAL/PLATELET
Basophils Absolute: 0 10*3/uL (ref 0.0–0.1)
Lymphocytes Relative: 25 % (ref 12–46)
Neutro Abs: 5 10*3/uL (ref 1.7–7.7)
Neutrophils Relative %: 56 % (ref 43–77)
Platelets: 206 10*3/uL (ref 150–400)
RDW: 15.7 % — ABNORMAL HIGH (ref 11.5–15.5)
WBC: 8.9 10*3/uL (ref 4.0–10.5)

## 2011-10-08 LAB — POCT I-STAT, CHEM 8
BUN: 8 mg/dL (ref 6–23)
Chloride: 100 mEq/L (ref 96–112)
Sodium: 138 mEq/L (ref 135–145)

## 2011-10-08 MED ORDER — GLUCAGON HCL (RDNA) 1 MG IJ SOLR
1.0000 mg | Freq: Once | INTRAMUSCULAR | Status: AC
  Administered 2011-10-08: 1 mg via INTRAVENOUS
  Filled 2011-10-08: qty 1

## 2011-10-08 MED ORDER — SODIUM CHLORIDE 0.9 % IV SOLN
Freq: Once | INTRAVENOUS | Status: AC
  Administered 2011-10-08: 02:00:00 via INTRAVENOUS

## 2011-10-08 NOTE — ED Provider Notes (Signed)
History     CSN: [id number]  Arrival date & time 10/07/11  2303   First MD Initiated Contact with Patient 10/08/11 0047      Chief Complaint  Patient presents with  . Swallowed Foreign Body    (Consider location/radiation/quality/duration/timing/severity/associated sxs/prior treatment) HPI Comments: 78 year old male with a history of esophageal cancer status post a gastric pullup procedure who presents with a complaint of several weeks of gradual dysphagia followed by eating steak this evening and complaining of a food bolus impaction that will not move. This happened at 7:00 PM, the symptoms have been persistent, they are not improving though he does have the occasional emesis of a small amount of the steak. There has been no fever, cough, chills and he denies any chest pain.  He has not seen his general surgeons recently but does have an appointment at [facility] for reevaluation of his revised esophagus soon. His gastroenterologist locally is Dr. Jarold Motto.  Patient is a 78 y.o. male presenting with foreign body swallowed. The history is provided by the patient and a relative.  Swallowed Foreign Body    Past Medical History  Diagnosis Date  . Fatty liver disease, nonalcoholic   . Coronary artery stenosis   . Diverticulosis of colon   . Diaphragmatic hernia without mention of obstruction or gangrene   . Dyspnea   . COPD (chronic obstructive pulmonary disease)   . Personal history of other malignant neoplasm of skin   . Barrett's esophagus   . Arthritis   . TIA (transient ischemic attack)   . Diabetes mellitus   . Hyperlipidemia   . Acid reflux disease   . Glaucoma   . HTN (hypertension)   . RENAL CALCULUS, HX OF 08/28/2008  . CAROTID ARTERY STENOSIS, BILATERAL 08/27/2008    Past Surgical History  Procedure Date  . Esophagectomy 2007    with stomach pull through  . Eye surgery   . Ventral hernia repair 2008  . Ortho knee both knees   . Carotid artery  angioplasty     Family History  Problem Relation Age of Onset  . Colon cancer Mother   . Colon cancer Maternal Grandfather   . Lung cancer Mother   . Heart attack Mother   . Heart attack Brother   . Heart attack Brother   . Stomach cancer Father   . Esophageal cancer Father     History  Substance Use Topics  . Smoking status: Former Smoker -- 1.0 packs/day for 40 years    Types: Cigarettes    Quit date: 01/24/1978  . Smokeless tobacco: Never Used  . Alcohol Use: No      Review of Systems  All other systems reviewed and are negative.    Allergies  Moxifloxacin; Penicillins; Sulfonamide derivatives; and Timolol maleate  Home Medications   Current Outpatient Rx  Name Route Sig Dispense Refill  . ALBUTEROL SULFATE HFA 108 (90 BASE) MCG/ACT IN AERS Inhalation Inhale 1-2 puffs into the lungs every 6 (six) hours as needed. For breathing    . AMLODIPINE BESYLATE 5 MG PO TABS Oral Take 5 mg by mouth daily.      . ASPIRIN 81 MG PO TABS Oral Take 81 mg by mouth daily.      . B COMPLEX PO TABS Oral Take 1 tablet by mouth daily.    . BECLOMETHASONE DIPROPIONATE 80 MCG/ACT IN AERS Inhalation Inhale 2 puffs into the lungs 2 (two) times daily. 1 Inhaler 6  .  BRIMONIDINE TARTRATE 0.1 % OP SOLN Left Eye Place 1 drop into the left eye 2 (two) times daily.      Marland Kitchen VITAMIN D 1000 UNITS PO CAPS Oral Take 1,000 Units by mouth daily.      Marland Kitchen CLONIDINE HCL 0.2 MG PO TABS  1 tab by mouth as needed when BP is over 140     . DIAZEPAM 5 MG PO TABS Oral Take 2.5 mg by mouth daily as needed. For anxiety    . FLUTICASONE FUROATE 27.5 MCG/SPRAY NA SUSP Nasal Place 2 sprays into the nose daily as needed.    Marland Kitchen FORMOTEROL FUMARATE 12 MCG IN CAPS Inhalation Place 12 mcg into inhaler and inhale 2 (two) times daily.      Marland Kitchen GLIPIZIDE ER 2.5 MG PO TB24 Oral Take 2.5 mg by mouth daily.    Marland Kitchen LOSARTAN POTASSIUM 100 MG PO TABS Oral Take 100 mg by mouth daily.      Marland Kitchen METFORMIN HCL 500 MG PO TABS Oral Take 1,000  mg by mouth 2 (two) times daily with a meal.      . MULTIVITAMINS PO CAPS Oral Take 1 capsule by mouth daily.      Marland Kitchen OMEPRAZOLE 20 MG PO CPDR Oral Take 40 mg by mouth daily.     Marland Kitchen POTASSIUM 99 MG PO TABS Oral Take 1 tablet by mouth daily.      Marland Kitchen PSEUDOEPHEDRINE-GUAIFENESIN ER 60-600 MG PO TB12 Oral Take 1 tablet by mouth every 12 (twelve) hours. 18 tablet 0  . SAW PALMETTO (SERENOA REPENS) 500 MG PO CAPS Oral Take 500 mg by mouth daily.      Marland Kitchen SIMVASTATIN 80 MG PO TABS Oral Take 40 mg by mouth at bedtime.     Marland Kitchen TIOTROPIUM BROMIDE MONOHYDRATE 18 MCG IN CAPS Inhalation Place 18 mcg into inhaler and inhale daily.      Marland Kitchen VALACYCLOVIR HCL 500 MG PO TABS Oral Take 1,000 mg by mouth every 8 (eight) hours as needed. For out breaks    . ZINC 100 MG PO TABS Oral Take 1 tablet by mouth daily.        BP 191/93  Pulse 81  Temp 98.8 F (37.1 C) (Oral)  Resp 18  SpO2 97%  Physical Exam  Nursing note and vitals reviewed. Constitutional: He appears well-developed and well-nourished. No distress.  HENT:  Head: Normocephalic and atraumatic.  Mouth/Throat: Oropharynx is clear and moist. No oropharyngeal exudate.       Oropharynx is clear and patent without any obvious foreign bodies  Eyes: Conjunctivae normal and EOM are normal. Pupils are equal, round, and reactive to light. Right eye exhibits no discharge. Left eye exhibits no discharge. No scleral icterus.  Neck: Normal range of motion. Neck supple. No JVD present. No thyromegaly present.  Cardiovascular: Normal rate, regular rhythm, normal heart sounds and intact distal pulses.  Exam reveals no gallop and no friction rub.   No murmur heard. Pulmonary/Chest: Effort normal and breath sounds normal. No respiratory distress. He has no wheezes. He has no rales.  Abdominal: Soft. Bowel sounds are normal. He exhibits no distension and no mass. There is no tenderness.       Large ventral wall hernia, nontender, easily reducible  Musculoskeletal: Normal  range of motion. He exhibits no edema and no tenderness.  Lymphadenopathy:    He has no cervical adenopathy.  Neurological: He is alert. Coordination normal.  Skin: Skin is warm and dry. No rash noted. No erythema.  Psychiatric: He has a normal mood and affect. His behavior is normal.    ED Course  Procedures (including critical care time)  Labs Reviewed  CBC WITH DIFFERENTIAL - Abnormal; Notable for the following:    RDW 15.7 (*)     Eosinophils Relative 8 (*)     All other components within normal limits  POCT I-STAT, CHEM 8 - Abnormal; Notable for the following:    Glucose, Bld 121 (*)     Calcium, Ion 1.32 (*)     All other components within normal limits   No results found.   1. Food impaction of esophagus   2. Hypertension       MDM  The patient appears overall very comfortable, will received with oral trial after glucagon, gastroenterology intervention if unsuccessful. Chest x-ray, labs.  After receiving medications, the impaction has moved distally and he is now able to swallow without any difficulty and feels back to baseline. He appears stable for discharge to followup in the outpatient setting, counseling given regarding Alona Bene of food to prevent recurrent impaction. Understanding expressed by family member and patient      Vida Roller, MD 10/08/11 [phone]

## 2011-10-10 NOTE — Telephone Encounter (Signed)
Wife or pt never called back. Saw where pt went to [facility] ER on 10/08/11 for a food impaction that resolved on it's own. Called wife this am for progress and she stated they have an appt with Dr Marcelle Smiling next week.

## 2011-10-11 DIAGNOSIS — K222 Esophageal obstruction: Secondary | ICD-10-CM | POA: Insufficient documentation

## 2011-10-11 DIAGNOSIS — K2270 Barrett's esophagus without dysplasia: Secondary | ICD-10-CM | POA: Insufficient documentation

## 2011-10-25 HISTORY — PX: ESOPHAGOGASTRODUODENOSCOPY (EGD) WITH ESOPHAGEAL DILATION: SHX5812

## 2011-11-07 DIAGNOSIS — E669 Obesity, unspecified: Secondary | ICD-10-CM | POA: Insufficient documentation

## 2011-12-27 ENCOUNTER — Ambulatory Visit (INDEPENDENT_AMBULATORY_CARE_PROVIDER_SITE_OTHER): Payer: Medicare Other | Admitting: Critical Care Medicine

## 2011-12-27 ENCOUNTER — Encounter: Payer: Self-pay | Admitting: Critical Care Medicine

## 2011-12-27 VITALS — BP 136/74 | HR 85 | Temp 98.1°F | Ht 69.0 in | Wt 185.4 lb

## 2011-12-27 DIAGNOSIS — J209 Acute bronchitis, unspecified: Secondary | ICD-10-CM

## 2011-12-27 DIAGNOSIS — J449 Chronic obstructive pulmonary disease, unspecified: Secondary | ICD-10-CM

## 2011-12-27 MED ORDER — FLUTICASONE FUROATE-VILANTEROL 100-25 MCG/INH IN AEPB: 1.0000 | INHALATION_SPRAY | Freq: Every day | RESPIRATORY_TRACT | Status: DC

## 2011-12-27 MED ORDER — PREDNISONE 10 MG PO TABS: ORAL_TABLET | ORAL | Status: DC

## 2011-12-27 MED ORDER — CEFDINIR 300 MG PO CAPS: 300.0000 mg | ORAL_CAPSULE | Freq: Two times a day (BID) | ORAL | Status: DC

## 2011-12-27 NOTE — Patient Instructions (Addendum)
Take cefdinir one twice daily for 7 days Stop foradil and qvar Start Breo one puff daily, use samples and free refill Return 2 months

## 2011-12-27 NOTE — Progress Notes (Signed)
Subjective:    Patient ID: Chad [name], male    DOB: 11-29-1929, 78 y.o.   MRN: [id number]  HPI 78 y.o. man with COPD primary emphysematous component.  Prior esophagectomy 2007 and abn CXR since that surgery.     06/21/2011 DOing well.  No new issues.  No chest pain. No mucus. Pt denies any significant sore throat, nasal congestion or excess secretions, fever, chills, sweats, unintended weight loss, pleurtic or exertional chest pain, orthopnea PND, or leg swelling Pt denies any increase in rescue therapy over baseline, denies waking up needing it or having any early am or nocturnal exacerbations of coughing/wheezing/or dyspnea. Pt also denies any obvious fluctuation in symptoms with  weather or environmental change or other alleviating or aggravating factors   09/21/2011 Pt c/o PND and sore throat.  No dyspnea. No mucus. Pt denies any significant sore throat, nasal congestion or excess secretions, fever, chills, sweats, unintended weight loss, pleurtic or exertional chest pain, orthopnea PND, or leg swelling Pt denies any increase in rescue therapy over baseline, denies waking up needing it or having any early am or nocturnal exacerbations of coughing/wheezing/or dyspnea. Pt also denies any obvious fluctuation in symptoms with  weather or environmental change or other alleviating or aggravating factors   12/27/2011 Pt notes for two days more dyspnea and cough.  No real chest pain.  Notes some wheezing. No real edema.  Notes some ankle pain.  No heartburn or indigestion.  Notes some pndrip and sore throat.      Past Medical History  Diagnosis Date  . Fatty liver disease, nonalcoholic   . Coronary artery stenosis   . Diverticulosis of colon   . Diaphragmatic hernia without mention of obstruction or gangrene   . Dyspnea   . COPD (chronic obstructive pulmonary disease)   . Personal history of other malignant neoplasm of skin   . Barrett's esophagus   . Arthritis   . TIA  (transient ischemic attack)   . Diabetes mellitus   . Hyperlipidemia   . Acid reflux disease   . Glaucoma(365)   . HTN (hypertension)   . RENAL CALCULUS, HX OF 08/28/2008  . CAROTID ARTERY STENOSIS, BILATERAL 08/27/2008     Family History  Problem Relation Age of Onset  . Colon cancer Mother   . Colon cancer Maternal Grandfather   . Lung cancer Mother   . Heart attack Mother   . Heart attack Brother   . Heart attack Brother   . Stomach cancer Father   . Esophageal cancer Father     Allergies  Allergen Reactions  . Moxifloxacin Anaphylaxis    Avelox REACTION: anaphylaxis  . Penicillins     REACTION: hives  . Sulfonamide Derivatives     REACTION: hives  . Timolol Maleate     Causes severe chest congestion    Review of Systems Constitutional:   No  weight loss, night sweats,  Fevers, chills,  No fatigue, or  lassitude.  HEENT:   No headaches,  Difficulty swallowing,  Tooth/dental problems, or  Sore throat,                No sneezing, itching, ear ache,  ++ nasal congestion, ++post nasal drip,   CV:  No chest pain,  Orthopnea, PND, swelling in lower extremities, anasarca, dizziness, palpitations, syncope.   GI  No heartburn, indigestion, abdominal pain, nausea, vomiting, diarrhea, change in bowel habits, loss of appetite, bloody stools.   Resp:    No coughing up  of blood.    No chest wall deformity  Skin: no rash or lesions.  GU: no dysuria, change in color of urine, no urgency or frequency.  No flank pain, no hematuria   MS:  No joint pain or swelling.  No decreased range of motion.  No back pain.  Psych:  No change in mood or affect. No depression or anxiety.  No memory loss.          Objective:   Physical Exam    Filed Vitals:   12/27/11 1135  BP: 136/74  Pulse: 85  Temp: 98.1 F (36.7 C)  TempSrc: Oral  Height: 5\' 9"  (1.753 m)  Weight: 185 lb 6.4 oz (84.097 kg)  SpO2: 96%    Gen: Pleasant, well-nourished, in no distress,  normal affect  ENT:  No lesions,  mouth clear,  oropharynx clear, no postnasal drip  Neck: No JVD, no TMG, no carotid bruits  Lungs: No use of accessory muscles, no dullness to percussion, expired wheezes distant breath sounds  Cardiovascular: RRR, heart sounds normal, no murmur or gallops, no peripheral edema  Abdomen: soft and NT, no HSM,  BS normal  Musculoskeletal: No deformities, no cyanosis or clubbing  Neuro: alert, non focal  Skin: Warm, no lesions or rashes  No results found.    No results found.  Assessment & Plan:      C O P D, Gold C Gold stage C. COPD with frequent exacerbations including current visit Plan Start nasonex two puff daily each nostril Cefdinir 600mg  daily for 7days Stay on asmanex Use neil med sinus rinse for 7days 1-2 times daily Return as needed if unimproved    Updated Medication List Outpatient Encounter Prescriptions as of 12/27/2011  Medication Sig Dispense Refill  . albuterol (PROAIR HFA) 108 (90 BASE) MCG/ACT inhaler Inhale 1-2 puffs into the lungs every 6 (six) hours as needed. For breathing      . amLODipine (NORVASC) 5 MG tablet Take 5 mg by mouth daily.        Marland Kitchen aspirin 81 MG tablet Take 81 mg by mouth daily.        Marland Kitchen b complex vitamins tablet Take 1 tablet by mouth daily.      . brimonidine (ALPHAGAN P) 0.1 % SOLN Place 1 drop into the left eye 2 (two) times daily.        . Cholecalciferol (VITAMIN D) 1000 UNITS capsule Take 1,000 Units by mouth daily.        . cloNIDine (CATAPRES) 0.2 MG tablet 1 tab by mouth as needed when BP is over 140       . diazepam (VALIUM) 5 MG tablet Take 2.5 mg by mouth daily as needed. For anxiety      . fluticasone (VERAMYST) 27.5 MCG/SPRAY nasal spray Place 2 sprays into the nose daily as needed.      Marland Kitchen glipiZIDE (GLUCOTROL XL) 2.5 MG 24 hr tablet Take 2.5 mg by mouth daily.      Marland Kitchen losartan (COZAAR) 100 MG tablet Take 100 mg by mouth daily.        . metFORMIN (GLUCOPHAGE) 500 MG tablet Take 1,000 mg by mouth 2 (two)  times daily with a meal.        . Multiple Vitamin (MULTIVITAMIN) capsule Take 1 capsule by mouth daily.        Marland Kitchen omeprazole (PRILOSEC) 20 MG capsule Take 40 mg by mouth daily.       . Potassium 99 MG TABS  Take 1 tablet by mouth daily.        . pseudoephedrine-guaifenesin (MUCINEX D) 60-600 MG per tablet Take 1 tablet by mouth every 12 (twelve) hours.  18 tablet  0  . saw palmetto 500 MG capsule Take 500 mg by mouth daily.        . simvastatin (ZOCOR) 80 MG tablet Take 40 mg by mouth at bedtime.       Marland Kitchen tiotropium (SPIRIVA) 18 MCG inhalation capsule Place 18 mcg into inhaler and inhale daily.        . valACYclovir (VALTREX) 500 MG tablet Take 1,000 mg by mouth every 8 (eight) hours as needed. For out breaks      . Zinc 100 MG TABS Take 1 tablet by mouth daily.        . [DISCONTINUED] beclomethasone (QVAR) 80 MCG/ACT inhaler Inhale 2 puffs into the lungs 2 (two) times daily.  1 Inhaler  6  . [DISCONTINUED] formoterol (FORADIL) 12 MCG capsule for inhaler Place 12 mcg into inhaler and inhale 2 (two) times daily.        . cefdinir (OMNICEF) 300 MG capsule Take 1 capsule (300 mg total) by mouth 2 (two) times daily.  14 capsule  0  . Fluticasone Furoate-Vilanterol (BREO ELLIPTA) 100-25 MCG/INH AEPB Inhale 1 puff into the lungs daily.  30 each  1  . Fluticasone Furoate-Vilanterol (BREO ELLIPTA) 100-25 MCG/INH AEPB Inhale 1 puff into the lungs daily.  28 each  0  . predniSONE (DELTASONE) 10 MG tablet Take 4 tablets daily for 5 days then stop  20 tablet  0  . [DISCONTINUED] Fluticasone Furoate-Vilanterol (BREO ELLIPTA) 100-25 MCG/INH AEPB Inhale 1 puff into the lungs daily.  30 each  1

## 2011-12-27 NOTE — Assessment & Plan Note (Signed)
Gold stage C. COPD with frequent exacerbations including current visit Plan Start nasonex two puff daily each nostril Cefdinir 600mg  daily for 7days Stay on asmanex Use neil med sinus rinse for 7days 1-2 times daily Return as needed if unimproved

## 2012-01-24 ENCOUNTER — Telehealth: Payer: Self-pay | Admitting: Critical Care Medicine

## 2012-01-24 MED ORDER — FLUTICASONE FUROATE-VILANTEROL 100-25 MCG/INH IN AEPB: 1.0000 | INHALATION_SPRAY | Freq: Every day | RESPIRATORY_TRACT | Status: DC

## 2012-01-24 NOTE — Telephone Encounter (Signed)
Dot Lanes is going to let the pt know that this has been done.

## 2012-03-14 ENCOUNTER — Encounter: Payer: Self-pay | Admitting: Critical Care Medicine

## 2012-03-14 ENCOUNTER — Ambulatory Visit (INDEPENDENT_AMBULATORY_CARE_PROVIDER_SITE_OTHER): Payer: Medicare Other | Admitting: Critical Care Medicine

## 2012-03-14 VITALS — BP 154/80 | HR 84 | Temp 98.7°F | Ht 69.0 in | Wt 189.0 lb

## 2012-03-14 DIAGNOSIS — J449 Chronic obstructive pulmonary disease, unspecified: Secondary | ICD-10-CM

## 2012-03-14 NOTE — Patient Instructions (Signed)
No change in medications. Return in         4 months 

## 2012-03-14 NOTE — Assessment & Plan Note (Signed)
Gold stage C. COPD with frequent exacerbations now stable at this time Plan Maintain inhaled medications as prescribed Return 6 months

## 2012-03-14 NOTE — Progress Notes (Signed)
Subjective:    Patient ID: Chad [name], male    DOB: 1929-04-01, 79 y.o.   MRN: [id number]  HPI 79 y.o. man with COPD primary emphysematous component.  Prior esophagectomy 2007 and abn CXR since that surgery.     06/21/2011 DOing well.  No new issues.  No chest pain. No mucus. Pt denies any significant sore throat, nasal congestion or excess secretions, fever, chills, sweats, unintended weight loss, pleurtic or exertional chest pain, orthopnea PND, or leg swelling Pt denies any increase in rescue therapy over baseline, denies waking up needing it or having any early am or nocturnal exacerbations of coughing/wheezing/or dyspnea. Pt also denies any obvious fluctuation in symptoms with  weather or environmental change or other alleviating or aggravating factors   09/21/2011 Pt c/o PND and sore throat.  No dyspnea. No mucus. Pt denies any significant sore throat, nasal congestion or excess secretions, fever, chills, sweats, unintended weight loss, pleurtic or exertional chest pain, orthopnea PND, or leg swelling Pt denies any increase in rescue therapy over baseline, denies waking up needing it or having any early am or nocturnal exacerbations of coughing/wheezing/or dyspnea. Pt also denies any obvious fluctuation in symptoms with  weather or environmental change or other alleviating or aggravating factors   12/27/2011 Pt notes for two days more dyspnea and cough.  No real chest pain.  Notes some wheezing. No real edema.  Notes some ankle pain.  No heartburn or indigestion.  Notes some pndrip and sore throat.  03/14/2012 No issues since the 12/13 OV.  Breathing doing well.  No real mucus.  No chest pain.  No wheeze. Pt failed Breo Pt denies any significant sore throat, nasal congestion or excess secretions, fever, chills, sweats, unintended weight loss, pleurtic or exertional chest pain, orthopnea PND, or leg swelling Pt denies any increase in rescue therapy over baseline, denies waking  up needing it or having any early am or nocturnal exacerbations of coughing/wheezing/or dyspnea. Pt also denies any obvious fluctuation in symptoms with  weather or environmental change or other alleviating or aggravating factors      Past Medical History  Diagnosis Date  . Fatty liver disease, nonalcoholic   . Coronary artery stenosis   . Diverticulosis of colon   . Diaphragmatic hernia without mention of obstruction or gangrene   . Dyspnea   . COPD (chronic obstructive pulmonary disease)   . Personal history of other malignant neoplasm of skin   . Barrett's esophagus   . Arthritis   . TIA (transient ischemic attack)   . Diabetes mellitus   . Hyperlipidemia   . Acid reflux disease   . Glaucoma(365)   . HTN (hypertension)   . RENAL CALCULUS, HX OF 08/28/2008  . CAROTID ARTERY STENOSIS, BILATERAL 08/27/2008     Family History  Problem Relation Age of Onset  . Colon cancer Mother   . Colon cancer Maternal Grandfather   . Lung cancer Mother   . Heart attack Mother   . Heart attack Brother   . Heart attack Brother   . Stomach cancer Father   . Esophageal cancer Father     Allergies  Allergen Reactions  . Moxifloxacin Anaphylaxis    Avelox REACTION: anaphylaxis  . Penicillins     REACTION: hives  . Sulfonamide Derivatives     REACTION: hives  . Timolol Maleate     Causes severe chest congestion    Review of Systems Constitutional:   No  weight loss, night sweats,  Fevers,  chills,  No fatigue, or  lassitude.  HEENT:   No headaches,  Difficulty swallowing,  Tooth/dental problems, or  Sore throat,                No sneezing, itching, ear ache,  ++ nasal congestion, ++post nasal drip,   CV:  No chest pain,  Orthopnea, PND, swelling in lower extremities, anasarca, dizziness, palpitations, syncope.   GI  No heartburn, indigestion, abdominal pain, nausea, vomiting, diarrhea, change in bowel habits, loss of appetite, bloody stools.   Resp:    No coughing up of blood.     No chest wall deformity  Skin: no rash or lesions.  GU: no dysuria, change in color of urine, no urgency or frequency.  No flank pain, no hematuria   MS:  No joint pain or swelling.  No decreased range of motion.  No back pain.  Psych:  No change in mood or affect. No depression or anxiety.  No memory loss.          Objective:   Physical Exam    Filed Vitals:   03/14/12 1638  BP: 154/80  Pulse: 84  Temp: 98.7 F (37.1 C)  TempSrc: Oral  Height: 5\' 9"  (1.753 m)  Weight: 189 lb (85.73 kg)  SpO2: 96%    Gen: Pleasant, well-nourished, in no distress,  normal affect  ENT: No lesions,  mouth clear,  oropharynx clear, no postnasal drip  Neck: No JVD, no TMG, no carotid bruits  Lungs: No use of accessory muscles, no dullness to percussion, no wheezes  Cardiovascular: RRR, heart sounds normal, no murmur or gallops, no peripheral edema  Abdomen: soft and NT, no HSM,  BS normal  Musculoskeletal: No deformities, no cyanosis or clubbing  Neuro: alert, non focal  Skin: Warm, no lesions or rashes  No results found.    No results found.  Assessment & Plan:      No problem-specific assessment & plan notes found for this encounter.   Updated Medication List Outpatient Encounter Prescriptions as of 03/14/2012  Medication Sig Dispense Refill  . albuterol (PROAIR HFA) 108 (90 BASE) MCG/ACT inhaler Inhale 1-2 puffs into the lungs every 6 (six) hours as needed. For breathing      . amLODipine (NORVASC) 5 MG tablet Take 5 mg by mouth daily.        Marland Kitchen aspirin 81 MG tablet Take 81 mg by mouth daily.        Marland Kitchen b complex vitamins tablet Take 1 tablet by mouth daily.      . brimonidine (ALPHAGAN P) 0.1 % SOLN Place 1 drop into the left eye 2 (two) times daily.        . Cholecalciferol (VITAMIN D) 1000 UNITS capsule Take 1,000 Units by mouth daily.        . cloNIDine (CATAPRES) 0.2 MG tablet 1 tab by mouth as needed when BP is over 140       . diazepam (VALIUM) 5 MG tablet Take  2.5 mg by mouth daily as needed. For anxiety      . fluticasone (VERAMYST) 27.5 MCG/SPRAY nasal spray Place 2 sprays into the nose daily as needed.      . formoterol (FORADIL AEROLIZER) 12 MCG capsule for inhaler Place 12 mcg into inhaler and inhale 2 (two) times daily.      Marland Kitchen glipiZIDE (GLUCOTROL XL) 2.5 MG 24 hr tablet Take 2.5 mg by mouth daily.      Marland Kitchen losartan (COZAAR) 100 MG  tablet Take 100 mg by mouth daily.        . metFORMIN (GLUCOPHAGE) 500 MG tablet Take 1,000 mg by mouth 2 (two) times daily with a meal.        . Multiple Vitamin (MULTIVITAMIN) capsule Take 1 capsule by mouth daily.        Marland Kitchen omeprazole (PRILOSEC) 20 MG capsule Take 40 mg by mouth daily.       . Potassium 99 MG TABS Take 1 tablet by mouth daily.        . pseudoephedrine-guaifenesin (MUCINEX D) 60-600 MG per tablet Take 1 tablet by mouth every 12 (twelve) hours.  18 tablet  0  . saw palmetto 500 MG capsule Take 500 mg by mouth daily.        . simvastatin (ZOCOR) 80 MG tablet Take 40 mg by mouth at bedtime.       Marland Kitchen tiotropium (SPIRIVA) 18 MCG inhalation capsule Place 18 mcg into inhaler and inhale daily.        . valACYclovir (VALTREX) 500 MG tablet Take 1,000 mg by mouth every 8 (eight) hours as needed. For out breaks      . Zinc 100 MG TABS Take 1 tablet by mouth daily.        . [DISCONTINUED] cefdinir (OMNICEF) 300 MG capsule Take 1 capsule (300 mg total) by mouth 2 (two) times daily.  14 capsule  0  . [DISCONTINUED] Fluticasone Furoate-Vilanterol (BREO ELLIPTA) 100-25 MCG/INH AEPB Inhale 1 puff into the lungs daily.  28 each  0  . [DISCONTINUED] Fluticasone Furoate-Vilanterol (BREO ELLIPTA) 100-25 MCG/INH AEPB Inhale 1 puff into the lungs daily.  30 each  1  . [DISCONTINUED] predniSONE (DELTASONE) 10 MG tablet Take 4 tablets daily for 5 days then stop  20 tablet  0   No facility-administered encounter medications on file as of 03/14/2012.

## 2012-05-22 ENCOUNTER — Other Ambulatory Visit (HOSPITAL_COMMUNITY): Payer: Self-pay | Admitting: Internal Medicine

## 2012-05-22 DIAGNOSIS — R0602 Shortness of breath: Secondary | ICD-10-CM

## 2012-05-24 ENCOUNTER — Ambulatory Visit (HOSPITAL_COMMUNITY): Payer: Medicare Other | Attending: Internal Medicine

## 2012-05-25 ENCOUNTER — Ambulatory Visit (HOSPITAL_COMMUNITY): Payer: Medicare Other

## 2012-05-28 ENCOUNTER — Ambulatory Visit (HOSPITAL_COMMUNITY)
Admission: RE | Admit: 2012-05-28 | Discharge: 2012-05-28 | Disposition: A | Discharge status: Home / Self Care | Payer: Medicare Other | Source: Ambulatory Visit | Attending: Internal Medicine | Admitting: Internal Medicine

## 2012-05-28 DIAGNOSIS — R0602 Shortness of breath: Secondary | ICD-10-CM

## 2012-05-28 DIAGNOSIS — I10 Essential (primary) hypertension: Secondary | ICD-10-CM | POA: Insufficient documentation

## 2012-05-28 NOTE — Progress Notes (Signed)
  Echocardiogram 2D Echocardiogram has been performed.  DEMIAN, MAISEL 05/28/2012, 5:45 PM

## 2012-06-11 ENCOUNTER — Other Ambulatory Visit: Payer: Self-pay | Admitting: Dermatology

## 2012-12-05 ENCOUNTER — Other Ambulatory Visit: Payer: Self-pay | Admitting: Internal Medicine

## 2012-12-05 ENCOUNTER — Ambulatory Visit
Admission: RE | Admit: 2012-12-05 | Discharge: 2012-12-05 | Disposition: A | Discharge status: Home / Self Care | Payer: Medicare Other | Source: Ambulatory Visit | Attending: Internal Medicine | Admitting: Internal Medicine

## 2012-12-05 DIAGNOSIS — R05 Cough: Secondary | ICD-10-CM

## 2012-12-05 DIAGNOSIS — R059 Cough, unspecified: Secondary | ICD-10-CM

## 2012-12-24 ENCOUNTER — Ambulatory Visit (INDEPENDENT_AMBULATORY_CARE_PROVIDER_SITE_OTHER)
Admission: RE | Admit: 2012-12-24 | Discharge: 2012-12-24 | Disposition: A | Discharge status: Home / Self Care | Payer: Medicare Other | Source: Ambulatory Visit | Attending: Critical Care Medicine | Admitting: Critical Care Medicine

## 2012-12-24 ENCOUNTER — Encounter: Payer: Self-pay | Admitting: Critical Care Medicine

## 2012-12-24 ENCOUNTER — Ambulatory Visit (INDEPENDENT_AMBULATORY_CARE_PROVIDER_SITE_OTHER): Payer: Medicare Other | Admitting: Critical Care Medicine

## 2012-12-24 VITALS — BP 150/76 | HR 98 | Temp 98.8°F | Ht 69.0 in | Wt 182.0 lb

## 2012-12-24 DIAGNOSIS — J209 Acute bronchitis, unspecified: Secondary | ICD-10-CM

## 2012-12-24 DIAGNOSIS — J449 Chronic obstructive pulmonary disease, unspecified: Secondary | ICD-10-CM

## 2012-12-24 DIAGNOSIS — J4489 Other specified chronic obstructive pulmonary disease: Secondary | ICD-10-CM

## 2012-12-24 MED ORDER — FLUTICASONE PROPIONATE 50 MCG/ACT NA SUSP: 2.0000 | Freq: Every day | NASAL | Status: DC

## 2012-12-24 MED ORDER — CEFUROXIME AXETIL 500 MG PO TABS: 500.0000 mg | ORAL_TABLET | Freq: Two times a day (BID) | ORAL | Status: DC

## 2012-12-24 MED ORDER — PREDNISONE 10 MG PO TABS: ORAL_TABLET | ORAL | Status: DC

## 2012-12-24 MED ORDER — METHYLPREDNISOLONE ACETATE 80 MG/ML IJ SUSP
120.0000 mg | Freq: Once | INTRAMUSCULAR | Status: AC
  Administered 2012-12-24: 120 mg via INTRAMUSCULAR

## 2012-12-24 NOTE — Progress Notes (Signed)
Subjective:    Patient ID: Chad [name], male    DOB: 23-Jun-1929, 79 y.o.   MRN: [id number]  HPI 79 y.o. man with COPD primary emphysematous component.  Prior esophagectomy 2007 and abn CXR since that surgery.    12/24/2012 Chief Complaint  Patient presents with  . Acute Visit    congestion, prod cough with green mucus, increased SOB, and wheezing.  Symptoms started x 2 wks ago.  No f/c/s.  Currently taking doxy and clarithromycin from PCP.    Pt ill few for two weeks.  PCP rx: Biaxin 500 bid x 10days started 12/15/12.   Doxy 50 bid x 10days. 12/18/12.   6days into this is unchanged and is weak.  No CXR was done.  Mucus now is green.  Pt remains very dyspneic.  No f/c/s.  No chest pain.  Notes mild edema in R ankle  Past Medical History  Diagnosis Date  . Fatty liver disease, nonalcoholic   . Coronary artery stenosis   . Diverticulosis of colon   . Diaphragmatic hernia without mention of obstruction or gangrene   . Dyspnea   . COPD (chronic obstructive pulmonary disease)   . Personal history of other malignant neoplasm of skin   . Barrett's esophagus   . Arthritis   . TIA (transient ischemic attack)   . Diabetes mellitus   . Hyperlipidemia   . Acid reflux disease   . Glaucoma   . HTN (hypertension)   . RENAL CALCULUS, HX OF 08/28/2008  . CAROTID ARTERY STENOSIS, BILATERAL 08/27/2008     Family History  Problem Relation Age of Onset  . Colon cancer Mother   . Colon cancer Maternal Grandfather   . Lung cancer Mother   . Heart attack Mother   . Heart attack Brother   . Heart attack Brother   . Stomach cancer Father   . Esophageal cancer Father     Allergies  Allergen Reactions  . Moxifloxacin Anaphylaxis    Avelox REACTION: anaphylaxis  . Penicillins     REACTION: hives  . Sulfonamide Derivatives     REACTION: hives  . Timolol Maleate     Causes severe chest congestion    Review of Systems Constitutional:   No  weight loss, night sweats,  Fevers, chills,  No  fatigue, or  lassitude.  HEENT:   No headaches,  Difficulty swallowing,  Tooth/dental problems, or  Sore throat,                No sneezing, itching, ear ache,  ++ nasal congestion, ++post nasal drip,   CV:  No chest pain,  Orthopnea, PND, swelling in lower extremities, anasarca, dizziness, palpitations, syncope.   GI  No heartburn, indigestion, abdominal pain, nausea, vomiting, diarrhea, change in bowel habits, loss of appetite, bloody stools.   Resp:    No coughing up of blood.    No chest wall deformity  Skin: no rash or lesions.  GU: no dysuria, change in color of urine, no urgency or frequency.  No flank pain, no hematuria   MS:  No joint pain or swelling.  No decreased range of motion.  No back pain.  Psych:  No change in mood or affect. No depression or anxiety.  No memory loss.     Objective:   Physical Exam    Filed Vitals:   12/24/12 1119  BP: 150/76  Pulse: 98  Temp: 98.8 F (37.1 C)  TempSrc: Oral  Height: 5\' 9"  (1.753 m)  Weight: 182 lb (82.555 kg)  SpO2: 97%    Gen: Pleasant, well-nourished, in no distress,  normal affect  ENT: No lesions,  mouth clear,  oropharynx clear, no postnasal drip  Neck: No JVD, no TMG, no carotid bruits  Lungs: No use of accessory muscles, no dullness to percussion, expired wheezes and scattered rhonchi  Cardiovascular: RRR, heart sounds normal, no murmur or gallops, no peripheral edema  Abdomen: soft and NT, no HSM,  BS normal  Musculoskeletal: No deformities, no cyanosis or clubbing  Neuro: alert, non focal  Skin: Warm, no lesions or rashes  Assessment & Plan:      Obstructive chronic bronchitis with exacerbation, Copd Gold C Gold stage C. COPD with frequent exacerbations now in a current exacerbation is noted The patient was given doxycycline and a low dose and not giving concurrent corticosteroids Plan A Depomedrol 120mg  injection was given Prednisone 10mg  Take 4 for three days 3 for three days 2 for three  days 1 for three days and stop Ceftin 500mg  twice daily for 10day, sent to pharmacy Finish doxycyclene and biaxin Use flonase two puff ea nostril daily, refill sent  Get some saline nasal spray and use 2-3 sprays ea nostril 2-3 times daily Chest xray today No other medication changes     Updated Medication List Outpatient Encounter Prescriptions as of 12/24/2012  Medication Sig  . albuterol (PROAIR HFA) 108 (90 BASE) MCG/ACT inhaler Inhale 1-2 puffs into the lungs every 6 (six) hours as needed. For breathing  . amLODipine (NORVASC) 5 MG tablet Take 5 mg by mouth daily.    Marland Kitchen aspirin 81 MG tablet Take 81 mg by mouth daily.    Marland Kitchen b complex vitamins tablet Take 1 tablet by mouth daily.  . brimonidine (ALPHAGAN P) 0.1 % SOLN Place 1 drop into the left eye 2 (two) times daily.    . Cholecalciferol (VITAMIN D) 1000 UNITS capsule Take 1,000 Units by mouth daily.    . clarithromycin (BIAXIN) 500 MG tablet Take 500 mg by mouth 2 (two) times daily.  . cloNIDine (CATAPRES) 0.2 MG tablet 1 tab by mouth as needed when BP is over 140   . diazepam (VALIUM) 5 MG tablet Take 2.5 mg by mouth daily as needed. For anxiety  . doxycycline (VIBRAMYCIN) 50 MG capsule Take 50 mg by mouth 2 (two) times daily.  . formoterol (FORADIL AEROLIZER) 12 MCG capsule for inhaler Place 12 mcg into inhaler and inhale 2 (two) times daily.  Marland Kitchen glipiZIDE (GLUCOTROL XL) 2.5 MG 24 hr tablet Take 2.5 mg by mouth daily.  Marland Kitchen losartan (COZAAR) 100 MG tablet Take 100 mg by mouth daily.    . metFORMIN (GLUCOPHAGE) 500 MG tablet Take 1,000 mg by mouth 2 (two) times daily with a meal.    . Multiple Vitamin (MULTIVITAMIN) capsule Take 1 capsule by mouth daily.    Marland Kitchen omeprazole (PRILOSEC) 20 MG capsule Take 40 mg by mouth daily.   . Potassium 99 MG TABS Take 1 tablet by mouth daily.    . saw palmetto 500 MG capsule Take 500 mg by mouth daily.    . simvastatin (ZOCOR) 80 MG tablet Take 40 mg by mouth at bedtime.   Marland Kitchen tiotropium (SPIRIVA) 18  MCG inhalation capsule Place 18 mcg into inhaler and inhale daily.    . valACYclovir (VALTREX) 500 MG tablet Take 1,000 mg by mouth every 8 (eight) hours as needed. For out breaks  . Zinc 100 MG TABS Take 1 tablet by  mouth daily.    . [DISCONTINUED] fluticasone (VERAMYST) 27.5 MCG/SPRAY nasal spray Place 2 sprays into the nose daily as needed.  . cefUROXime (CEFTIN) 500 MG tablet Take 1 tablet (500 mg total) by mouth 2 (two) times daily.  . fluticasone (FLONASE) 50 MCG/ACT nasal spray Place 2 sprays into both nostrils daily.  . predniSONE (DELTASONE) 10 MG tablet Take 4 for three days 3 for three days 2 for three days 1 for three days and stop  . [EXPIRED] methylPREDNISolone acetate (DEPO-MEDROL) injection 120 mg

## 2012-12-24 NOTE — Progress Notes (Signed)
Quick Note:  Called, spoke with pt. Informed him of cxr results and recs per Dr. Wright. He verbalized understanding of this and voiced no further questions or concerns at this time. ______ 

## 2012-12-24 NOTE — Assessment & Plan Note (Signed)
Gold stage C. COPD with frequent exacerbations now in a current exacerbation is noted The patient was given doxycycline and a low dose and not giving concurrent corticosteroids Plan A Depomedrol 120mg  injection was given Prednisone 10mg  Take 4 for three days 3 for three days 2 for three days 1 for three days and stop Ceftin 500mg  twice daily for 10day, sent to pharmacy Finish doxycyclene and biaxin Use flonase two puff ea nostril daily, refill sent  Get some saline nasal spray and use 2-3 sprays ea nostril 2-3 times daily Chest xray today No other medication changes

## 2012-12-24 NOTE — Progress Notes (Signed)
Quick Note:  Notify the patient that the Xray is stable and no pneumonia No change in medications are recommended. Continue current meds as prescribed at last office visit ______ 

## 2012-12-24 NOTE — Patient Instructions (Signed)
A Depomedrol 120mg  injection was given Prednisone 10mg  Take 4 for three days 3 for three days 2 for three days 1 for three days and stop Ceftin 500mg  twice daily for 10day, sent to pharmacy Finish doxycyclene and biaxin Use flonase two puff ea nostril daily, refill sent  Get some saline nasal spray and use 2-3 sprays ea nostril 2-3 times daily Chest xray today No other medication changes

## 2013-01-10 ENCOUNTER — Encounter: Payer: Self-pay | Admitting: Emergency Medicine

## 2013-01-10 ENCOUNTER — Telehealth: Payer: Self-pay | Admitting: Critical Care Medicine

## 2013-01-10 ENCOUNTER — Ambulatory Visit (INDEPENDENT_AMBULATORY_CARE_PROVIDER_SITE_OTHER): Payer: Medicare Other | Admitting: Emergency Medicine

## 2013-01-10 VITALS — BP 160/92 | HR 95 | Temp 97.7°F | Ht 69.0 in | Wt 185.4 lb

## 2013-01-10 DIAGNOSIS — J441 Chronic obstructive pulmonary disease with (acute) exacerbation: Secondary | ICD-10-CM

## 2013-01-10 MED ORDER — PREDNISONE 10 MG PO TABS: ORAL_TABLET | ORAL | Status: DC

## 2013-01-10 MED ORDER — AZITHROMYCIN 250 MG PO TABS: 250.0000 mg | ORAL_TABLET | Freq: Once | ORAL | Status: DC

## 2013-01-10 NOTE — Progress Notes (Signed)
Subjective:    Patient ID: Chad [name], male    DOB: 1929/12/20, 79 y.o.   MRN: [id number]  HPI79 y.o. man with COPD primary emphysematous component.  Prior esophagectomy 2007 and abn CXR since that surgery.    12/24/2012 Chief Complaint  Patient presents with  . Acute Visit    congestion, prod cough with green mucus, increased SOB, and wheezing.  Symptoms started x 2 wks ago.  No f/c/s.  Currently taking doxy and clarithromycin from PCP.    Pt ill few for two weeks.  PCP rx: Biaxin 500 bid x 10days started 12/15/12.   Doxy 50 bid x 10days. 12/18/12.   6days into this is unchanged and is weak.  No CXR was done.  Mucus now is green.  Pt remains very dyspneic.  No f/c/s.  No chest pain.  Notes mild edema in R ankle  Acute OV 01/10/13 -- acute visit for mild dyspnea, wheezing, yellow/green productive cough. He has some nasal drainage and congestion. He has been using albuterol every few days. He is on fluticasone nasal spray.    Past Medical History  Diagnosis Date  . Fatty liver disease, nonalcoholic   . Coronary artery stenosis   . Diverticulosis of colon   . Diaphragmatic hernia without mention of obstruction or gangrene   . Dyspnea   . COPD (chronic obstructive pulmonary disease)   . Personal history of other malignant neoplasm of skin   . Barrett's esophagus   . Arthritis   . TIA (transient ischemic attack)   . Diabetes mellitus   . Hyperlipidemia   . Acid reflux disease   . Glaucoma   . HTN (hypertension)   . RENAL CALCULUS, HX OF 08/28/2008  . CAROTID ARTERY STENOSIS, BILATERAL 08/27/2008     Family History  Problem Relation Age of Onset  . Colon cancer Mother   . Colon cancer Maternal Grandfather   . Lung cancer Mother   . Heart attack Mother   . Heart attack Brother   . Heart attack Brother   . Stomach cancer Father   . Esophageal cancer Father     Allergies  Allergen Reactions  . Moxifloxacin Anaphylaxis    Avelox REACTION: anaphylaxis  . Penicillins    REACTION: hives  . Sulfonamide Derivatives     REACTION: hives  . Timolol Maleate     Causes severe chest congestion    Review of SystemsConstitutional:   No  weight loss, night sweats,  Fevers, chills,  No fatigue, or  lassitude.  HEENT:   No headaches,  Difficulty swallowing,  Tooth/dental problems, or  Sore throat,                No sneezing, itching, ear ache,  ++ nasal congestion, ++post nasal drip,   CV:  No chest pain,  Orthopnea, PND, swelling in lower extremities, anasarca, dizziness, palpitations, syncope.   GI  No heartburn, indigestion, abdominal pain, nausea, vomiting, diarrhea, change in bowel habits, loss of appetite, bloody stools.   Resp:    No coughing up of blood.    No chest wall deformity  Skin: no rash or lesions.  GU: no dysuria, change in color of urine, no urgency or frequency.  No flank pain, no hematuria   MS:  No joint pain or swelling.  No decreased range of motion.  No back pain.  Psych:  No change in mood or affect. No depression or anxiety.  No memory loss.     Objective:  Physical Exam   Filed Vitals:   01/10/13 1619  BP: 160/92  Pulse: 95  Temp: 97.7 F (36.5 C)  TempSrc: Oral  Height: 5\' 9"  (1.753 m)  Weight: 185 lb 6.4 oz (84.097 kg)  SpO2: 97%    Gen: Pleasant, well-nourished, in no distress,  normal affect  ENT: No lesions,  mouth clear,  oropharynx clear, no postnasal drip  Neck: No JVD, no TMG, no carotid bruits  Lungs: No use of accessory muscles, focal exp squeek and wheeze R mid lung  Cardiovascular: RRR, heart sounds normal, no murmur or gallops, no peripheral edema  Abdomen: soft and NT, no HSM,  BS normal  Musculoskeletal: No deformities, no cyanosis or clubbing  Neuro: alert, non focal  Skin: Warm, no lesions or rashes  Assessment & Plan:      Obstructive chronic bronchitis with exacerbation, Copd Gold C Describes purulent sputum, has scattered wheezes. Not clear to me that this isn't just a continuation  of his last episode - he never seemed to feel better. I may be treating a chronic condition instead of an acute exacerbation. Difficult for me to delineate between the two based on this eval.  - will rx for AE-COPD, azithro + pred taper - rov with DR Delford Field as planned    Updated Medication List Outpatient Encounter Prescriptions as of 01/10/2013  Medication Sig  . albuterol (PROAIR HFA) 108 (90 BASE) MCG/ACT inhaler Inhale 1-2 puffs into the lungs every 6 (six) hours as needed. For breathing  . amLODipine (NORVASC) 5 MG tablet Take 5 mg by mouth daily.    Marland Kitchen aspirin 81 MG tablet Take 81 mg by mouth daily.    Marland Kitchen b complex vitamins tablet Take 1 tablet by mouth daily.  . brimonidine (ALPHAGAN P) 0.1 % SOLN Place 1 drop into the left eye 2 (two) times daily.    . Cholecalciferol (VITAMIN D) 1000 UNITS capsule Take 1,000 Units by mouth daily.    . cloNIDine (CATAPRES) 0.2 MG tablet 1 tab by mouth as needed when BP is over 140   . diazepam (VALIUM) 5 MG tablet Take 2.5 mg by mouth daily as needed. For anxiety  . fluticasone (FLONASE) 50 MCG/ACT nasal spray Place 2 sprays into both nostrils daily.  . formoterol (FORADIL AEROLIZER) 12 MCG capsule for inhaler Place 12 mcg into inhaler and inhale 2 (two) times daily.  Marland Kitchen glipiZIDE (GLUCOTROL XL) 2.5 MG 24 hr tablet Take 2.5 mg by mouth daily.  Marland Kitchen losartan (COZAAR) 100 MG tablet Take 100 mg by mouth daily.    . metFORMIN (GLUCOPHAGE) 500 MG tablet Take 1,000 mg by mouth 2 (two) times daily with a meal.    . Multiple Vitamin (MULTIVITAMIN) capsule Take 1 capsule by mouth daily.    Marland Kitchen omeprazole (PRILOSEC) 20 MG capsule Take 40 mg by mouth daily.   . Potassium 99 MG TABS Take 1 tablet by mouth daily.    . saw palmetto 500 MG capsule Take 500 mg by mouth daily.    . simvastatin (ZOCOR) 80 MG tablet Take 40 mg by mouth at bedtime.   Marland Kitchen tiotropium (SPIRIVA) 18 MCG inhalation capsule Place 18 mcg into inhaler and inhale daily.    . valACYclovir (VALTREX) 500  MG tablet Take 1,000 mg by mouth every 8 (eight) hours as needed. For out breaks  . Zinc 100 MG TABS Take 1 tablet by mouth daily.    . [DISCONTINUED] cefUROXime (CEFTIN) 500 MG tablet Take 1 tablet (500 mg  total) by mouth 2 (two) times daily.  . [DISCONTINUED] predniSONE (DELTASONE) 10 MG tablet Take 4 for three days 3 for three days 2 for three days 1 for three days and stop  . azithromycin (ZITHROMAX) 250 MG tablet Take 1 tablet (250 mg total) by mouth once.  . predniSONE (DELTASONE) 10 MG tablet Take 40mg  daily for 3 days, then 30mg  daily for 3 days, then 20mg  daily for 3 days, then 10mg  daily for 3 days, then stop

## 2013-01-10 NOTE — Telephone Encounter (Signed)
[email or web address]  

## 2013-01-10 NOTE — Telephone Encounter (Signed)
Pt was seen by PW on 12/24/12 with the following instructions:  Patient Instructions     A Depomedrol 120mg  injection was given  Prednisone 10mg  Take 4 for three days 3 for three days 2 for three days 1 for three days and stop  Ceftin 500mg  twice daily for 10day, sent to pharmacy  Finish doxycyclene and biaxin  Use flonase two puff ea nostril daily, refill sent  Get some saline nasal spray and use 2-3 sprays ea nostril 2-3 times daily  Chest xray today  No other medication changes    -----  Wife reports pt finished the abx and pred approx 2 days ago.  States x 2 nights ago pt had reflux.  Since this episode pt's symptoms have returned.  Reports he has a prod cough with green mucus and wheezing last night.  No increased SOB, chest tightness, or f/c/s.  We have scheduled pt to see RB today at 4:30 -- wife aware and will inform pt.

## 2013-01-10 NOTE — Telephone Encounter (Signed)
Wife calling back.  °

## 2013-01-10 NOTE — Patient Instructions (Addendum)
Please take azithromycin for 5 days as directed.  Please take prednisone as directed until it is gone Continue your foradil and spiriva Use albuterol as needed Follow up with Dr Delford Field 01/28/13 as planned

## 2013-01-10 NOTE — Assessment & Plan Note (Addendum)
Describes purulent sputum, has scattered wheezes. Not clear to me that this isn't just a continuation of his last episode - he never seemed to feel better. I may be treating a chronic condition instead of an acute exacerbation. Difficult for me to delineate between the two based on this eval.  - will rx for AE-COPD, azithro + pred taper - rov with DR Delford Field as planned

## 2013-01-16 ENCOUNTER — Ambulatory Visit (INDEPENDENT_AMBULATORY_CARE_PROVIDER_SITE_OTHER): Payer: Medicare Other | Admitting: Podiatry

## 2013-01-16 ENCOUNTER — Encounter: Payer: Self-pay | Admitting: Podiatry

## 2013-01-16 VITALS — BP 108/61 | HR 117 | Resp 18

## 2013-01-16 DIAGNOSIS — M79609 Pain in unspecified limb: Secondary | ICD-10-CM

## 2013-01-16 DIAGNOSIS — B351 Tinea unguium: Secondary | ICD-10-CM

## 2013-01-16 NOTE — Progress Notes (Signed)
° °  Subjective:    Patient ID: Chad [name], male    DOB: 1929/03/09, 79 y.o.   MRN: [id number]  HPI trim my nails patient request debridement of painful toenails. He is a known diabetic    Review of Systems  Constitutional: Negative.   HENT: Negative.   Eyes: Negative.   Respiratory: Positive for wheezing.   Cardiovascular: Negative.   Gastrointestinal: Negative.   Endocrine: Negative.   Genitourinary: Negative.   Musculoskeletal: Negative.   Skin: Negative.   Allergic/Immunologic: Negative.   Neurological: Negative.   Hematological: Bruises/bleeds easily.  Psychiatric/Behavioral: Negative.        Objective:   Physical Exam Orientated x80 79 year old white male Elongated, hypertrophic, discolored toenails x10 noted       Assessment & Plan:   Assessment: Symptomatic onychomycoses x10  Plan: All 10 toenails are debrided without a bleeding. Reappoint at patient's request.

## 2013-01-16 NOTE — Patient Instructions (Signed)
Diabetes and Foot Care Diabetes may cause you to have problems because of poor blood supply (circulation) to your feet and legs. This may cause the skin on your feet to become thinner, break easier, and heal more slowly. Your skin may become dry, and the skin may peel and crack. You may also have nerve damage in your legs and feet causing decreased feeling in them. You may not notice minor injuries to your feet that could lead to infections or more serious problems. Taking care of your feet is one of the most important things you can do for yourself.  HOME CARE INSTRUCTIONS  Wear shoes at all times, even in the house. Do not go barefoot. Bare feet are easily injured.  Check your feet daily for blisters, cuts, and redness. If you cannot see the bottom of your feet, use a mirror or ask someone for help.  Wash your feet with warm water (do not use hot water) and mild soap. Then pat your feet and the areas between your toes until they are completely dry. Do not soak your feet as this can dry your skin.  Apply a moisturizing lotion or petroleum jelly (that does not contain alcohol and is unscented) to the skin on your feet and to dry, brittle toenails. Do not apply lotion between your toes.  Trim your toenails straight across. Do not dig under them or around the cuticle. File the edges of your nails with an emery board or nail file.  Do not cut corns or calluses or try to remove them with medicine.  Wear clean socks or stockings every day. Make sure they are not too tight. Do not wear knee-high stockings since they may decrease blood flow to your legs.  Wear shoes that fit properly and have enough cushioning. To break in new shoes, wear them for just a few hours a day. This prevents you from injuring your feet. Always look in your shoes before you put them on to be sure there are no objects inside.  Do not cross your legs. This may decrease the blood flow to your feet.  If you find a minor scrape,  cut, or break in the skin on your feet, keep it and the skin around it clean and dry. These areas may be cleansed with mild soap and water. Do not cleanse the area with peroxide, alcohol, or iodine.  When you remove an adhesive bandage, be sure not to damage the skin around it.  If you have a wound, look at it several times a day to make sure it is healing.  Do not use heating pads or hot water bottles. They may burn your skin. If you have lost feeling in your feet or legs, you may not know it is happening until it is too late.  Make sure your health care provider performs a complete foot exam at least annually or more often if you have foot problems. Report any cuts, sores, or bruises to your health care provider immediately. SEEK MEDICAL CARE IF:   You have an injury that is not healing.  You have cuts or breaks in the skin.  You have an ingrown nail.  You notice redness on your legs or feet.  You feel burning or tingling in your legs or feet.  You have pain or cramps in your legs and feet.  Your legs or feet are numb.  Your feet always feel cold. SEEK IMMEDIATE MEDICAL CARE IF:   There is increasing redness,   swelling, or pain in or around a wound.  There is a red line that goes up your leg.  Pus is coming from a wound.  You develop a fever or as directed by your health care provider.  You notice a bad smell coming from an ulcer or wound. Document Released: 01/08/2000 Document Revised: 09/12/2012 Document Reviewed: 06/19/2012 [employer] Patient Information 2014 [employer].  

## 2013-01-28 ENCOUNTER — Ambulatory Visit (INDEPENDENT_AMBULATORY_CARE_PROVIDER_SITE_OTHER): Payer: Medicare HMO | Admitting: Critical Care Medicine

## 2013-01-28 ENCOUNTER — Encounter: Payer: Self-pay | Admitting: Critical Care Medicine

## 2013-01-28 VITALS — BP 148/70 | HR 80 | Temp 97.7°F | Ht 69.0 in | Wt 182.8 lb

## 2013-01-28 DIAGNOSIS — J441 Chronic obstructive pulmonary disease with (acute) exacerbation: Secondary | ICD-10-CM

## 2013-01-28 NOTE — Assessment & Plan Note (Signed)
Gold stage C. COPD with frequent exacerbations now improved Plan Maintain inhaled medications as prescribed

## 2013-01-28 NOTE — Patient Instructions (Signed)
No change in medications. Return in        2 months 

## 2013-01-28 NOTE — Progress Notes (Signed)
Subjective:    Patient ID: Chad [name], male    DOB: 14-Apr-1929, 80 y.o.   MRN: [id number]  HPI 80 y.o. man with COPD primary emphysematous component.  Prior esophagectomy 2007 and abn CXR since that surgery.    01/28/2013 Chief Complaint  Patient presents with  . 1 month follow up    Breathing has improved.  SOB at baseline.  Notices wheezing and a little chest tightness.  No cough.  Now is better. Less cough, still wheezing.  No real chest pain.  No real cough or mucus.  Dyspnea is better.  No qhs dyspnea. Pt denies any significant sore throat, nasal congestion or excess secretions, fever, chills, sweats, unintended weight loss, pleurtic or exertional chest pain, orthopnea PND, or leg swelling Pt denies any increase in rescue therapy over baseline, denies waking up needing it or having any early am or nocturnal exacerbations of coughing/wheezing/or dyspnea. Pt also denies any obvious fluctuation in symptoms with  weather or environmental change or other alleviating or aggravating factors    Past Medical History  Diagnosis Date  . Fatty liver disease, nonalcoholic   . Coronary artery stenosis   . Diverticulosis of colon   . Diaphragmatic hernia without mention of obstruction or gangrene   . Dyspnea   . COPD (chronic obstructive pulmonary disease)   . Personal history of other malignant neoplasm of skin   . Barrett's esophagus   . Arthritis   . TIA (transient ischemic attack)   . Diabetes mellitus   . Hyperlipidemia   . Acid reflux disease   . Glaucoma   . HTN (hypertension)   . RENAL CALCULUS, HX OF 08/28/2008  . CAROTID ARTERY STENOSIS, BILATERAL 08/27/2008     Family History  Problem Relation Age of Onset  . Colon cancer Mother   . Colon cancer Maternal Grandfather   . Lung cancer Mother   . Heart attack Mother   . Heart attack Brother   . Heart attack Brother   . Stomach cancer Father   . Esophageal cancer Father     Allergies  Allergen Reactions  .  Moxifloxacin Anaphylaxis    Avelox REACTION: anaphylaxis  . Penicillins     REACTION: hives  . Sulfonamide Derivatives     REACTION: hives  . Timolol Maleate     Causes severe chest congestion    Review of Systems Constitutional:   No  weight loss, night sweats,  Fevers, chills,  No fatigue, or  lassitude.  HEENT:   No headaches,  Difficulty swallowing,  Tooth/dental problems, or  Sore throat,                No sneezing, itching, ear ache,  ++ nasal congestion, ++post nasal drip,   CV:  No chest pain,  Orthopnea, PND, swelling in lower extremities, anasarca, dizziness, palpitations, syncope.   GI  No heartburn, indigestion, abdominal pain, nausea, vomiting, diarrhea, change in bowel habits, loss of appetite, bloody stools.   Resp:    No coughing up of blood.    No chest wall deformity  Skin: no rash or lesions.  GU: no dysuria, change in color of urine, no urgency or frequency.  No flank pain, no hematuria   MS:  No joint pain or swelling.  No decreased range of motion.  No back pain.  Psych:  No change in mood or affect. No depression or anxiety.  No memory loss.     Objective:   Physical Exam  Filed Vitals:   01/28/13 1011  BP: 148/70  Pulse: 80  Temp: 97.7 F (36.5 C)  TempSrc: Oral  Height: 5\' 9"  (1.753 m)  Weight: 182 lb 12.8 oz (82.918 kg)  SpO2: 97%    Gen: Pleasant, well-nourished, in no distress,  normal affect  ENT: No lesions,  mouth clear,  oropharynx clear, no postnasal drip  Neck: No JVD, no TMG, no carotid bruits  Lungs: No use of accessory muscles, distant breath sounds with production and wheezes  Cardiovascular: RRR, heart sounds normal, no murmur or gallops, no peripheral edema  Abdomen: soft and NT, no HSM,  BS normal  Musculoskeletal: No deformities, no cyanosis or clubbing  Neuro: alert, non focal  Skin: Warm, no lesions or rashes  Assessment & Plan:      Obstructive chronic bronchitis with exacerbation, Copd Gold C Gold  stage C. COPD with frequent exacerbations now improved Plan Maintain inhaled medications as prescribed    Updated Medication List Outpatient Encounter Prescriptions as of 01/28/2013  Medication Sig  . albuterol (PROAIR HFA) 108 (90 BASE) MCG/ACT inhaler Inhale 1-2 puffs into the lungs every 6 (six) hours as needed. For breathing  . amLODipine (NORVASC) 5 MG tablet Take 5 mg by mouth daily.    Marland Kitchen aspirin 81 MG tablet Take 81 mg by mouth daily.    Marland Kitchen b complex vitamins tablet Take 1 tablet by mouth daily.  . brimonidine (ALPHAGAN P) 0.1 % SOLN Place 1 drop into the left eye 2 (two) times daily.    . Cholecalciferol (VITAMIN D) 1000 UNITS capsule Take 1,000 Units by mouth daily.    . cloNIDine (CATAPRES) 0.2 MG tablet 1 tab by mouth as needed when BP is over 140   . diazepam (VALIUM) 5 MG tablet Take 2.5 mg by mouth daily as needed. For anxiety  . formoterol (FORADIL AEROLIZER) 12 MCG capsule for inhaler Place 12 mcg into inhaler and inhale 2 (two) times daily.  Marland Kitchen glipiZIDE (GLUCOTROL XL) 2.5 MG 24 hr tablet Take 2.5 mg by mouth daily.  Marland Kitchen losartan (COZAAR) 100 MG tablet Take 100 mg by mouth daily.    . metFORMIN (GLUCOPHAGE) 500 MG tablet Take 1,000 mg by mouth 2 (two) times daily with a meal.    . omeprazole (PRILOSEC) 20 MG capsule Take 40 mg by mouth 2 (two) times daily.   . Potassium 99 MG TABS Take 1 tablet by mouth daily.    . saw palmetto 500 MG capsule Take 500 mg by mouth daily.    . simvastatin (ZOCOR) 80 MG tablet Take 40 mg by mouth at bedtime.   Marland Kitchen tiotropium (SPIRIVA) 18 MCG inhalation capsule Place 18 mcg into inhaler and inhale daily.    . valACYclovir (VALTREX) 500 MG tablet Take 1,000 mg by mouth every 8 (eight) hours as needed. For out breaks  . Zinc 100 MG TABS Take 1 tablet by mouth daily.    . fluticasone (FLONASE) 50 MCG/ACT nasal spray Place 2 sprays into both nostrils daily.  . [DISCONTINUED] azithromycin (ZITHROMAX) 250 MG tablet Take 1 tablet (250 mg total) by mouth  once.  . [DISCONTINUED] cefUROXime (CEFTIN) 500 MG tablet   . [DISCONTINUED] clarithromycin (BIAXIN) 500 MG tablet   . [DISCONTINUED] doxycycline (VIBRAMYCIN) 50 MG capsule   . [DISCONTINUED] JANUVIA 50 MG tablet ON HOLD  . [DISCONTINUED] Multiple Vitamin (MULTIVITAMIN) capsule Take 1 capsule by mouth daily.    . [DISCONTINUED] predniSONE (DELTASONE) 10 MG tablet Take 40mg  daily for 3 days,  then 30mg  daily for 3 days, then 20mg  daily for 3 days, then 10mg  daily for 3 days, then stop

## 2013-03-18 ENCOUNTER — Encounter: Payer: Self-pay | Admitting: Adult Health

## 2013-03-18 ENCOUNTER — Ambulatory Visit (INDEPENDENT_AMBULATORY_CARE_PROVIDER_SITE_OTHER): Payer: Medicare HMO | Admitting: Adult Health

## 2013-03-18 VITALS — BP 136/66 | HR 79 | Temp 97.0°F | Ht 69.0 in | Wt 185.4 lb

## 2013-03-18 DIAGNOSIS — J441 Chronic obstructive pulmonary disease with (acute) exacerbation: Secondary | ICD-10-CM

## 2013-03-18 MED ORDER — DOXYCYCLINE HYCLATE 100 MG PO TABS: 100.0000 mg | ORAL_TABLET | Freq: Two times a day (BID) | ORAL | Status: DC

## 2013-03-18 MED ORDER — BECLOMETHASONE DIPROPIONATE 80 MCG/ACT IN AERS: 2.0000 | INHALATION_SPRAY | Freq: Two times a day (BID) | RESPIRATORY_TRACT | Status: DC

## 2013-03-18 MED ORDER — HYDROCODONE-HOMATROPINE 5-1.5 MG/5ML PO SYRP: 5.0000 mL | ORAL_SOLUTION | Freq: Four times a day (QID) | ORAL | Status: DC | PRN

## 2013-03-18 MED ORDER — PREDNISONE 10 MG PO TABS: ORAL_TABLET | ORAL | Status: DC

## 2013-03-18 NOTE — Assessment & Plan Note (Signed)
Doxycycline 100 mg twice daily for 7 days, take with food. Mucinex  Twice daily as needed. For cough Delsym 2 teaspoons every 12 hours as needed. For cough. Hydromet 1 teaspoon every 6 hours as needed. For cough, may make you sleepy Fluids and rest. Add Qvar 80 mcg 2 puffs twice daily, rinse after inhaler use. Increase Foradil 1 puff Twice daily   Please contact office for sooner follow up if symptoms do not improve or worsen or seek emergency care  Follow up Dr. Joya Gaskins  In 6-8 weeks and As needed

## 2013-03-18 NOTE — Patient Instructions (Signed)
Doxycycline 100 mg twice daily for 7 days, take with food. Mucinex  Twice daily as needed. For cough Delsym 2 teaspoons every 12 hours as needed. For cough. Hydromet 1 teaspoon every 6 hours as needed. For cough, may make you sleepy Fluids and rest. Added Qvar 80 mcg 2 puffs twice daily, rinse after inhaler use. Increase Foradil 1 puff Twice daily   Please contact office for sooner follow up if symptoms do not improve or worsen or seek emergency care  Follow up Dr. Joya Gaskins  In 6-8 weeks and As needed

## 2013-03-18 NOTE — Progress Notes (Signed)
Subjective:    Patient ID: Chad [name], male    DOB: 11-Sep-1929, 80 y.o.   MRN: [id number]  HPI 80 y.o. man with COPD primary emphysematous component.  Prior esophagectomy 2007 and abn CXR since that surgery.    01/28/2013 Chief Complaint  Patient presents with  . 1 month follow up    Breathing has improved.  SOB at baseline.  Notices wheezing and a little chest tightness.  No cough.  Now is better. Less cough, still wheezing.  No real chest pain.  No real cough or mucus.  Dyspnea is better.  No qhs dyspnea. >>no changes   03/18/2013 Acute OV  Complains of dry cough, wheezing, increased SOB x1 month.  Increased ProAir use.  Had bronchitis last 12/24/12 , tx w/ abx and steroids . Denies f/c/s, chest tightness, congestion On Foradil (only taking daily )  On Spiriva daily  CXR 12/24/12 NAD.  No reflux or dsyphagia .  Patient denies any hemoptysis, orthopnea, PND, or leg swelling.    Past Medical History  Diagnosis Date  . Fatty liver disease, nonalcoholic   . Coronary artery stenosis   . Diverticulosis of colon   . Diaphragmatic hernia without mention of obstruction or gangrene   . Dyspnea   . COPD (chronic obstructive pulmonary disease)   . Personal history of other malignant neoplasm of skin   . Barrett's esophagus   . Arthritis   . TIA (transient ischemic attack)   . Diabetes mellitus   . Hyperlipidemia   . Acid reflux disease   . Glaucoma   . HTN (hypertension)   . RENAL CALCULUS, HX OF 08/28/2008  . CAROTID ARTERY STENOSIS, BILATERAL 08/27/2008     Family History  Problem Relation Age of Onset  . Colon cancer Mother   . Colon cancer Maternal Grandfather   . Lung cancer Mother   . Heart attack Mother   . Heart attack Brother   . Heart attack Brother   . Stomach cancer Father   . Esophageal cancer Father     Allergies  Allergen Reactions  . Moxifloxacin Anaphylaxis    Avelox REACTION: anaphylaxis  . Penicillins     REACTION: hives  . Sulfonamide  Derivatives     REACTION: hives  . Timolol Maleate     Causes severe chest congestion    Review of Systems Constitutional:   No  weight loss, night sweats,  Fevers, chills,  No fatigue, or  lassitude.  HEENT:   No headaches,  Difficulty swallowing,  Tooth/dental problems, or  Sore throat,                No sneezing, itching, ear ache, nasal congestion, post nasal drip,   CV:  No chest pain,  Orthopnea, PND, swelling in lower extremities, anasarca, dizziness, palpitations, syncope.   GI  No heartburn, indigestion, abdominal pain, nausea, vomiting, diarrhea, change in bowel habits, loss of appetite, bloody stools.   Resp:    No coughing up of blood.    No chest wall deformity  Skin: no rash or lesions.  GU: no dysuria, change in color of urine, no urgency or frequency.  No flank pain, no hematuria   MS:  No joint pain or swelling.  No decreased range of motion.  No back pain.  Psych:  No change in mood or affect. No depression or anxiety.  No memory loss.     Objective:   Physical Exam    Filed Vitals:   03/18/13 1409  BP: 136/66  Pulse: 79  Temp: 97 F (36.1 C)  TempSrc: Oral  Height: 5\' 9"  (1.753 m)  Weight: 185 lb 6.4 oz (84.097 kg)  SpO2: 97%    Gen: Pleasant, elderly  in no distress,  normal affect  ENT: No lesions,  mouth clear,  oropharynx clear, no postnasal drip  Neck: No JVD, no TMG, no carotid bruits  Lungs: exp wheezing, no stridor , speaks in full sentenences   Cardiovascular: RRR, heart sounds normal, no murmur or gallops, no peripheral edema  Abdomen: soft and NT, no HSM,  BS normal  Musculoskeletal: No deformities, no cyanosis or clubbing  Neuro: alert, non focal  Skin: Warm, no lesions or rashes  Assessment & Plan:      No problem-specific assessment & plan notes found for this encounter.   Updated Medication List Outpatient Encounter Prescriptions as of 03/18/2013  Medication Sig  . albuterol (PROAIR HFA) 108 (90 BASE) MCG/ACT  inhaler Inhale 1-2 puffs into the lungs every 6 (six) hours as needed. For breathing  . amLODipine (NORVASC) 5 MG tablet Take 5 mg by mouth daily.    Marland Kitchen aspirin 81 MG tablet Take 81 mg by mouth daily.    Marland Kitchen b complex vitamins tablet Take 1 tablet by mouth daily.  . brimonidine (ALPHAGAN P) 0.1 % SOLN Place 1 drop into the left eye 2 (two) times daily.    . Cholecalciferol (VITAMIN D) 1000 UNITS capsule Take 1,000 Units by mouth daily.    . cloNIDine (CATAPRES) 0.2 MG tablet 1 tab by mouth as needed when BP is over 140   . diazepam (VALIUM) 5 MG tablet Take 2.5 mg by mouth daily as needed. For anxiety  . fluticasone (FLONASE) 50 MCG/ACT nasal spray Place 2 sprays into both nostrils daily.  . formoterol (FORADIL AEROLIZER) 12 MCG capsule for inhaler Place 12 mcg into inhaler and inhale 2 (two) times daily.  Marland Kitchen glipiZIDE (GLUCOTROL XL) 2.5 MG 24 hr tablet Take 2.5 mg by mouth daily.  Marland Kitchen losartan (COZAAR) 100 MG tablet Take 100 mg by mouth daily.    . metFORMIN (GLUCOPHAGE) 500 MG tablet Take 1,000 mg by mouth 2 (two) times daily with a meal.    . omeprazole (PRILOSEC) 20 MG capsule Take 40 mg by mouth 2 (two) times daily.   . Potassium 99 MG TABS Take 1 tablet by mouth daily.    . saw palmetto 500 MG capsule Take 500 mg by mouth daily.    . simvastatin (ZOCOR) 80 MG tablet Take 40 mg by mouth at bedtime.   Marland Kitchen tiotropium (SPIRIVA) 18 MCG inhalation capsule Place 18 mcg into inhaler and inhale daily.    . valACYclovir (VALTREX) 500 MG tablet Take 1,000 mg by mouth every 8 (eight) hours as needed. For out breaks  . Zinc 100 MG TABS Take 1 tablet by mouth daily.

## 2013-05-01 ENCOUNTER — Encounter: Payer: Self-pay | Admitting: Critical Care Medicine

## 2013-05-01 ENCOUNTER — Ambulatory Visit (INDEPENDENT_AMBULATORY_CARE_PROVIDER_SITE_OTHER): Payer: Medicare HMO | Admitting: Critical Care Medicine

## 2013-05-01 VITALS — BP 140/76 | HR 78 | Temp 98.7°F | Ht 69.0 in | Wt 186.2 lb

## 2013-05-01 DIAGNOSIS — J441 Chronic obstructive pulmonary disease with (acute) exacerbation: Secondary | ICD-10-CM

## 2013-05-01 NOTE — Progress Notes (Signed)
Subjective:    Patient ID: Chad [name], male    DOB: 1930/01/21, 80 y.o.   MRN: [id number]  HPI 80 y.o. man with COPD primary emphysematous component.  Prior esophagectomy 2007 and abn CXR since that surgery.    05/02/2013 Chief Complaint  Patient presents with  . 6 wk follow up    Breathing has improved since OV with TP.  SOB is at baseline.  Coughing spell last night with clear mucus.  No chest tightness/pain.  No new issues. Better than last OV Pt denies any significant sore throat, nasal congestion or excess secretions, fever, chills, sweats, unintended weight loss, pleurtic or exertional chest pain, orthopnea PND, or leg swelling Pt denies any increase in rescue therapy over baseline, denies waking up needing it or having any early am or nocturnal exacerbations of coughing/wheezing/or dyspnea. Pt also denies any obvious fluctuation in symptoms with  weather or environmental change or other alleviating or aggravating factors     Past Medical History  Diagnosis Date  . Fatty liver disease, nonalcoholic   . Coronary artery stenosis   . Diverticulosis of colon   . Diaphragmatic hernia without mention of obstruction or gangrene   . Dyspnea   . COPD (chronic obstructive pulmonary disease)   . Personal history of other malignant neoplasm of skin   . Barrett's esophagus   . Arthritis   . TIA (transient ischemic attack)   . Diabetes mellitus   . Hyperlipidemia   . Acid reflux disease   . Glaucoma   . HTN (hypertension)   . RENAL CALCULUS, HX OF 08/28/2008  . CAROTID ARTERY STENOSIS, BILATERAL 08/27/2008     Family History  Problem Relation Age of Onset  . Colon cancer Mother   . Colon cancer Maternal Grandfather   . Lung cancer Mother   . Heart attack Mother   . Heart attack Brother   . Heart attack Brother   . Stomach cancer Father   . Esophageal cancer Father     Allergies  Allergen Reactions  . Moxifloxacin Anaphylaxis    Avelox REACTION: anaphylaxis  .  Penicillins     REACTION: hives  . Sulfonamide Derivatives     REACTION: hives  . Timolol Maleate     Causes severe chest congestion    Review of Systems Constitutional:   No  weight loss, night sweats,  Fevers, chills,  No fatigue, or  lassitude.  HEENT:   No headaches,  Difficulty swallowing,  Tooth/dental problems, or  Sore throat,                No sneezing, itching, ear ache, nasal congestion, post nasal drip,   CV:  No chest pain,  Orthopnea, PND, swelling in lower extremities, anasarca, dizziness, palpitations, syncope.   GI  No heartburn, indigestion, abdominal pain, nausea, vomiting, diarrhea, change in bowel habits, loss of appetite, bloody stools.   Resp:    No coughing up of blood.    No chest wall deformity  Skin: no rash or lesions.  GU: no dysuria, change in color of urine, no urgency or frequency.  No flank pain, no hematuria   MS:  No joint pain or swelling.  No decreased range of motion.  No back pain.  Psych:  No change in mood or affect. No depression or anxiety.  No memory loss.     Objective:   Physical Exam    Filed Vitals:   05/01/13 1107  BP: 140/76  Pulse: 78  Temp:  98.7 F (37.1 C)  TempSrc: Oral  Height: 5\' 9"  (1.753 m)  Weight: 186 lb 3.2 oz (84.46 kg)  SpO2: 96%    Gen: Pleasant, elderly  in no distress,  normal affect  ENT: No lesions,  mouth clear,  oropharynx clear, no postnasal drip  Neck: No JVD, no TMG, no carotid bruits  Lungs: distant BS, no stridor , speaks in full sentenences   Cardiovascular: RRR, heart sounds normal, no murmur or gallops, no peripheral edema  Abdomen: soft and NT, no HSM,  BS normal  Musculoskeletal: No deformities, no cyanosis or clubbing  Neuro: alert, non focal  Skin: Warm, no lesions or rashes  Assessment & Plan:      Obstructive chronic bronchitis with exacerbation, Copd Gold C Gold C copd with frequent exacerbations Plan When current Qvar runs out, you may stop  No other medication  changes Return 4 months     Updated Medication List Outpatient Encounter Prescriptions as of 05/01/2013  Medication Sig  . albuterol (PROAIR HFA) 108 (90 BASE) MCG/ACT inhaler Inhale 1-2 puffs into the lungs every 6 (six) hours as needed. For breathing  . amLODipine (NORVASC) 5 MG tablet Take 5 mg by mouth daily.    Marland Kitchen aspirin 81 MG tablet Take 81 mg by mouth daily.    . beclomethasone (QVAR) 80 MCG/ACT inhaler Inhale 2 puffs into the lungs 2 (two) times daily.  . brimonidine (ALPHAGAN P) 0.1 % SOLN Place 1 drop into the left eye 2 (two) times daily.    . Cholecalciferol (VITAMIN D) 1000 UNITS capsule Take 1,000 Units by mouth daily.    . cloNIDine (CATAPRES) 0.2 MG tablet 1 tab by mouth as needed when BP is over 140   . diazepam (VALIUM) 5 MG tablet Take 2.5 mg by mouth daily as needed. For anxiety  . formoterol (FORADIL AEROLIZER) 12 MCG capsule for inhaler Place 12 mcg into inhaler and inhale 2 (two) times daily.  Marland Kitchen glipiZIDE (GLUCOTROL XL) 2.5 MG 24 hr tablet Take 2.5 mg by mouth daily.  Marland Kitchen loratadine (CLARITIN) 10 MG tablet Take 10 mg by mouth daily.  Marland Kitchen losartan (COZAAR) 100 MG tablet Take 100 mg by mouth daily.    . metFORMIN (GLUCOPHAGE) 500 MG tablet Take 1,000 mg by mouth 2 (two) times daily with a meal.    . naproxen sodium (ANAPROX) 220 MG tablet Take 220 mg by mouth as needed.  Marland Kitchen omeprazole (PRILOSEC) 20 MG capsule Take 40 mg by mouth 2 (two) times daily.   . Potassium 99 MG TABS Take 1 tablet by mouth daily.    . saw palmetto 500 MG capsule Take 500 mg by mouth daily.    . simvastatin (ZOCOR) 80 MG tablet Take 40 mg by mouth at bedtime.   Marland Kitchen tiotropium (SPIRIVA) 18 MCG inhalation capsule Place 18 mcg into inhaler and inhale daily.    . valACYclovir (VALTREX) 500 MG tablet Take 1,000 mg by mouth every 8 (eight) hours as needed. For out breaks  . Zinc 100 MG TABS Take 1 tablet by mouth daily.    . fluticasone (FLONASE) 50 MCG/ACT nasal spray Place 2 sprays into both nostrils  daily.  . [DISCONTINUED] b complex vitamins tablet Take 1 tablet by mouth daily.  . [DISCONTINUED] doxycycline (VIBRA-TABS) 100 MG tablet Take 1 tablet (100 mg total) by mouth 2 (two) times daily.  . [DISCONTINUED] HYDROcodone-homatropine (HYDROMET) 5-1.5 MG/5ML syrup Take 5 mLs by mouth every 6 (six) hours as needed for cough.  . [  DISCONTINUED] predniSONE (DELTASONE) 10 MG tablet 4 tabs for 2 days, then 3 tabs for 2 days, 2 tabs for 2 days, then 1 tab for 2 days, then stop

## 2013-05-01 NOTE — Patient Instructions (Signed)
When current Qvar runs out, you may stop  No other medication changes Return 4 months

## 2013-05-02 NOTE — Assessment & Plan Note (Signed)
Gold C copd with frequent exacerbations Plan When current Qvar runs out, you may stop  No other medication changes Return 4 months

## 2013-06-19 ENCOUNTER — Encounter: Payer: Self-pay | Admitting: Podiatry

## 2013-06-19 ENCOUNTER — Ambulatory Visit (INDEPENDENT_AMBULATORY_CARE_PROVIDER_SITE_OTHER): Payer: Medicare HMO | Admitting: Podiatry

## 2013-06-19 VITALS — BP 148/69 | HR 84 | Resp 16

## 2013-06-19 DIAGNOSIS — M79609 Pain in unspecified limb: Secondary | ICD-10-CM

## 2013-06-19 DIAGNOSIS — B351 Tinea unguium: Secondary | ICD-10-CM

## 2013-06-19 NOTE — Progress Notes (Signed)
Subjective:     Patient ID: Chad [name], male   DOB: 11/17/1929, 80 y.o.   MRN: [id number]  HPI patient presents with thick yellow nailbeds 1-5 both feet that are painful   Review of Systems     Objective:   Physical Exam Neurovascular status intact with thick incurvated yellow nailbeds 1-5 bilateral    Assessment:     Chronic nail 1-5 both feet that are painful    Plan:     Debridement painful mycotic nailbeds 1-5 both feet with no bleeding noted

## 2013-06-19 NOTE — Patient Instructions (Signed)
Diabetes and Foot Care Diabetes may cause you to have problems because of poor blood supply (circulation) to your feet and legs. This may cause the skin on your feet to become thinner, break easier, and heal more slowly. Your skin may become dry, and the skin may peel and crack. You may also have nerve damage in your legs and feet causing decreased feeling in them. You may not notice minor injuries to your feet that could lead to infections or more serious problems. Taking care of your feet is one of the most important things you can do for yourself.  HOME CARE INSTRUCTIONS  Wear shoes at all times, even in the house. Do not go barefoot. Bare feet are easily injured.  Check your feet daily for blisters, cuts, and redness. If you cannot see the bottom of your feet, use a mirror or ask someone for help.  Wash your feet with warm water (do not use hot water) and mild soap. Then pat your feet and the areas between your toes until they are completely dry. Do not soak your feet as this can dry your skin.  Apply a moisturizing lotion or petroleum jelly (that does not contain alcohol and is unscented) to the skin on your feet and to dry, brittle toenails. Do not apply lotion between your toes.  Trim your toenails straight across. Do not dig under them or around the cuticle. File the edges of your nails with an emery board or nail file.  Do not cut corns or calluses or try to remove them with medicine.  Wear clean socks or stockings every day. Make sure they are not too tight. Do not wear knee-high stockings since they may decrease blood flow to your legs.  Wear shoes that fit properly and have enough cushioning. To break in new shoes, wear them for just a few hours a day. This prevents you from injuring your feet. Always look in your shoes before you put them on to be sure there are no objects inside.  Do not cross your legs. This may decrease the blood flow to your feet.  If you find a minor scrape,  cut, or break in the skin on your feet, keep it and the skin around it clean and dry. These areas may be cleansed with mild soap and water. Do not cleanse the area with peroxide, alcohol, or iodine.  When you remove an adhesive bandage, be sure not to damage the skin around it.  If you have a wound, look at it several times a day to make sure it is healing.  Do not use heating pads or hot water bottles. They may burn your skin. If you have lost feeling in your feet or legs, you may not know it is happening until it is too late.  Make sure your health care provider performs a complete foot exam at least annually or more often if you have foot problems. Report any cuts, sores, or bruises to your health care provider immediately. SEEK MEDICAL CARE IF:   You have an injury that is not healing.  You have cuts or breaks in the skin.  You have an ingrown nail.  You notice redness on your legs or feet.  You feel burning or tingling in your legs or feet.  You have pain or cramps in your legs and feet.  Your legs or feet are numb.  Your feet always feel cold. SEEK IMMEDIATE MEDICAL CARE IF:   There is increasing redness,   swelling, or pain in or around a wound.  There is a red line that goes up your leg.  Pus is coming from a wound.  You develop a fever or as directed by your health care provider.  You notice a bad smell coming from an ulcer or wound. Document Released: 01/08/2000 Document Revised: 09/12/2012 Document Reviewed: 06/19/2012 [employer] Patient Information 2014 [employer].  

## 2013-07-03 ENCOUNTER — Ambulatory Visit (INDEPENDENT_AMBULATORY_CARE_PROVIDER_SITE_OTHER): Payer: Medicare HMO | Admitting: Adult Health

## 2013-07-03 ENCOUNTER — Encounter: Payer: Self-pay | Admitting: Adult Health

## 2013-07-03 VITALS — BP 122/58 | HR 76 | Temp 98.4°F | Ht 69.0 in | Wt 186.9 lb

## 2013-07-03 DIAGNOSIS — J441 Chronic obstructive pulmonary disease with (acute) exacerbation: Secondary | ICD-10-CM

## 2013-07-03 MED ORDER — DOXYCYCLINE HYCLATE 100 MG PO TABS: 100.0000 mg | ORAL_TABLET | Freq: Two times a day (BID) | ORAL | Status: DC

## 2013-07-03 NOTE — Progress Notes (Signed)
Subjective:    Patient ID: Chad [name], male    DOB: [birth year], 80 y.o.   MRN: [id number]  HPI 80 y.o. man with COPD primary emphysematous component.  Prior esophagectomy 2007 and abn CXR since that surgery.    07/03/2013 Acute OV  Complains of PW pt here for increased SOB, wheezing, tightness, prod cough with clear mucus x1 week.  Denies f/c/s, hemoptysis, nausea, vomiting, PND. Wife had similar symptoms last week.  No otc used .  No recent travel or abx use.  Doing well until last week.      Past Medical History  Diagnosis Date  . Fatty liver disease, nonalcoholic   . Coronary artery stenosis   . Diverticulosis of colon   . Diaphragmatic hernia without mention of obstruction or gangrene   . Dyspnea   . COPD (chronic obstructive pulmonary disease)   . Personal history of other malignant neoplasm of skin   . Barrett's esophagus   . Arthritis   . TIA (transient ischemic attack)   . Diabetes mellitus   . Hyperlipidemia   . Acid reflux disease   . Glaucoma   . HTN (hypertension)   . RENAL CALCULUS, HX OF 08/28/2008  . CAROTID ARTERY STENOSIS, BILATERAL 08/27/2008     Family History  Problem Relation Age of Onset  . Colon cancer Mother   . Colon cancer Maternal Grandfather   . Lung cancer Mother   . Heart attack Mother   . Heart attack Brother   . Heart attack Brother   . Stomach cancer Father   . Esophageal cancer Father     Allergies  Allergen Reactions  . Moxifloxacin Anaphylaxis    Avelox REACTION: anaphylaxis  . Penicillins     REACTION: hives  . Sulfonamide Derivatives     REACTION: hives  . Timolol Maleate     Causes severe chest congestion    Review of Systems Constitutional:   No  weight loss, night sweats,  Fevers, chills,  No fatigue, or  lassitude.  HEENT:   No headaches,  Difficulty swallowing,  Tooth/dental problems, or  Sore throat,                No sneezing, itching, ear ache,  +nasal congestion, post nasal drip,   CV:  No chest pain,   Orthopnea, PND, swelling in lower extremities, anasarca, dizziness, palpitations, syncope.   GI  No heartburn, indigestion, abdominal pain, nausea, vomiting, diarrhea, change in bowel habits, loss of appetite, bloody stools.   Resp:    No coughing up of blood.    No chest wall deformity  Skin: no rash or lesions.  GU: no dysuria, change in color of urine, no urgency or frequency.  No flank pain, no hematuria   MS:  No joint pain or swelling.  No decreased range of motion.  No back pain.  Psych:  No change in mood or affect. No depression or anxiety.  No memory loss.     Objective:   Physical Exam    Filed Vitals:   07/03/13 1138  BP: 122/58  Pulse: 76  Temp: 98.4 F (36.9 C)  TempSrc: Oral  Height: 5\' 9"  (1.753 m)  Weight: 186 lb 14.4 oz (84.777 kg)  SpO2: 96%    Gen: Pleasant, elderly  in no distress,  normal affect  ENT: No lesions,  mouth clear,  oropharynx clear, no postnasal drip  Neck: No JVD, no TMG, no carotid bruits  Lungs: Decreased BS w/ no wheezing,  no stridor , speaks in full sentenences   Cardiovascular: RRR, heart sounds normal, no murmur or gallops, no peripheral edema  Abdomen: soft and NT, no HSM,  BS normal  Musculoskeletal: No deformities, no cyanosis or clubbing  Neuro: alert, non focal  Skin: Warm, no lesions or rashes  Assessment & Plan:      No problem-specific assessment & plan notes found for this encounter.   Updated Medication List Outpatient Encounter Prescriptions as of 07/03/2013  Medication Sig  . albuterol (PROAIR HFA) 108 (90 BASE) MCG/ACT inhaler Inhale 1-2 puffs into the lungs every 6 (six) hours as needed. For breathing  . amLODipine (NORVASC) 5 MG tablet Take 5 mg by mouth daily.    Marland Kitchen aspirin 81 MG tablet Take 81 mg by mouth daily.    . beclomethasone (QVAR) 80 MCG/ACT inhaler Inhale 2 puffs into the lungs 2 (two) times daily.  . brimonidine (ALPHAGAN P) 0.1 % SOLN Place 1 drop into the left eye 2 (two) times daily.     . Cholecalciferol (VITAMIN D) 1000 UNITS capsule Take 1,000 Units by mouth daily.    . cloNIDine (CATAPRES) 0.2 MG tablet 1 tab by mouth as needed when BP is over 140   . diazepam (VALIUM) 5 MG tablet Take 2.5 mg by mouth daily as needed. For anxiety  . fluticasone (FLONASE) 50 MCG/ACT nasal spray Place 2 sprays into both nostrils daily.  . formoterol (FORADIL AEROLIZER) 12 MCG capsule for inhaler Place 12 mcg into inhaler and inhale 2 (two) times daily.  Marland Kitchen glipiZIDE (GLUCOTROL XL) 2.5 MG 24 hr tablet Take 2.5 mg by mouth daily.  Marland Kitchen losartan (COZAAR) 100 MG tablet Take 100 mg by mouth daily.    . metFORMIN (GLUCOPHAGE) 500 MG tablet Take 1,000 mg by mouth 2 (two) times daily with a meal.    . naproxen sodium (ANAPROX) 220 MG tablet Take 220 mg by mouth as needed.  Marland Kitchen omeprazole (PRILOSEC) 20 MG capsule Take 40 mg by mouth 2 (two) times daily.   . Potassium 99 MG TABS Take 1 tablet by mouth daily.    . saw palmetto 500 MG capsule Take 500 mg by mouth daily.    . simvastatin (ZOCOR) 80 MG tablet Take 40 mg by mouth at bedtime.   Marland Kitchen tiotropium (SPIRIVA) 18 MCG inhalation capsule Place 18 mcg into inhaler and inhale daily.    . valACYclovir (VALTREX) 500 MG tablet Take 1,000 mg by mouth every 8 (eight) hours as needed. For out breaks  . Zinc 100 MG TABS Take 1 tablet by mouth daily.    Marland Kitchen loratadine (CLARITIN) 10 MG tablet Take 10 mg by mouth daily.

## 2013-07-03 NOTE — Assessment & Plan Note (Signed)
Flare   Plan  Doxycycline 100 mg twice daily for 7 days, take with food.(caution with sunshine)  Mucinex  Twice daily as needed. For cough Delsym 2 teaspoons every 12 hours as needed. For cough. Please contact office for sooner follow up if symptoms do not improve or worsen or seek emergency care  Follow up Dr. Joya Gaskins  As planned and As needed

## 2013-07-03 NOTE — Patient Instructions (Signed)
Doxycycline 100 mg twice daily for 7 days, take with food.(caution with sunshine)  Mucinex  Twice daily as needed. For cough Delsym 2 teaspoons every 12 hours as needed. For cough. Please contact office for sooner follow up if symptoms do not improve or worsen or seek emergency care  Follow up Dr. Joya Gaskins  As planned and As needed

## 2013-09-19 ENCOUNTER — Ambulatory Visit (INDEPENDENT_AMBULATORY_CARE_PROVIDER_SITE_OTHER): Payer: Medicare HMO | Admitting: Podiatry

## 2013-09-19 DIAGNOSIS — B351 Tinea unguium: Secondary | ICD-10-CM

## 2013-09-19 DIAGNOSIS — M79609 Pain in unspecified limb: Secondary | ICD-10-CM

## 2013-09-19 DIAGNOSIS — M79673 Pain in unspecified foot: Secondary | ICD-10-CM

## 2013-09-19 NOTE — Progress Notes (Signed)
   Subjective:    Patient ID: Chad [name], male    DOB: 11/13/1929, 80 y.o.   MRN: [id number]  HPI Pt presents for nail debridement   Review of Systems     Objective:   Physical Exam        Assessment & Plan:

## 2013-09-19 NOTE — Patient Instructions (Signed)
Diabetes and Foot Care Diabetes may cause you to have problems because of poor blood supply (circulation) to your feet and legs. This may cause the skin on your feet to become thinner, break easier, and heal more slowly. Your skin may become dry, and the skin may peel and crack. You may also have nerve damage in your legs and feet causing decreased feeling in them. You may not notice minor injuries to your feet that could lead to infections or more serious problems. Taking care of your feet is one of the most important things you can do for yourself.  HOME CARE INSTRUCTIONS  Wear shoes at all times, even in the house. Do not go barefoot. Bare feet are easily injured.  Check your feet daily for blisters, cuts, and redness. If you cannot see the bottom of your feet, use a mirror or ask someone for help.  Wash your feet with warm water (do not use hot water) and mild soap. Then pat your feet and the areas between your toes until they are completely dry. Do not soak your feet as this can dry your skin.  Apply a moisturizing lotion or petroleum jelly (that does not contain alcohol and is unscented) to the skin on your feet and to dry, brittle toenails. Do not apply lotion between your toes.  Trim your toenails straight across. Do not dig under them or around the cuticle. File the edges of your nails with an emery board or nail file.  Do not cut corns or calluses or try to remove them with medicine.  Wear clean socks or stockings every day. Make sure they are not too tight. Do not wear knee-high stockings since they may decrease blood flow to your legs.  Wear shoes that fit properly and have enough cushioning. To break in new shoes, wear them for just a few hours a day. This prevents you from injuring your feet. Always look in your shoes before you put them on to be sure there are no objects inside.  Do not cross your legs. This may decrease the blood flow to your feet.  If you find a minor scrape,  cut, or break in the skin on your feet, keep it and the skin around it clean and dry. These areas may be cleansed with mild soap and water. Do not cleanse the area with peroxide, alcohol, or iodine.  When you remove an adhesive bandage, be sure not to damage the skin around it.  If you have a wound, look at it several times a day to make sure it is healing.  Do not use heating pads or hot water bottles. They may burn your skin. If you have lost feeling in your feet or legs, you may not know it is happening until it is too late.  Make sure your health care provider performs a complete foot exam at least annually or more often if you have foot problems. Report any cuts, sores, or bruises to your health care provider immediately. SEEK MEDICAL CARE IF:   You have an injury that is not healing.  You have cuts or breaks in the skin.  You have an ingrown nail.  You notice redness on your legs or feet.  You feel burning or tingling in your legs or feet.  You have pain or cramps in your legs and feet.  Your legs or feet are numb.  Your feet always feel cold. SEEK IMMEDIATE MEDICAL CARE IF:   There is increasing redness,   swelling, or pain in or around a wound.  There is a red line that goes up your leg.  Pus is coming from a wound.  You develop a fever or as directed by your health care provider.  You notice a bad smell coming from an ulcer or wound. Document Released: 01/08/2000 Document Revised: 09/12/2012 Document Reviewed: 06/19/2012 [employer] Patient Information 2015 [employer]. This information is not intended to replace advice given to you by your health care provider. Make sure you discuss any questions you have with your health care provider.  

## 2013-09-19 NOTE — Progress Notes (Signed)
Subjective:     Patient ID: Chad [name], male   DOB: 1929-06-22, 80 y.o.   MRN: [id number]  HPI patient presents with thick nailbeds 1-5 both feet that are painful and he cannot take care of   Review of Systems     Objective:   Physical Exam Neurovascular status intact with thick yellow brittle nailbeds 1-5 both feet    Assessment:     Mycotic nail infection is with pain 1-5 both feet    Plan:     Debride painful nailbeds 1-5 both feet with no iatrogenic bleeding noted

## 2013-09-23 ENCOUNTER — Ambulatory Visit: Payer: Medicare HMO | Admitting: Critical Care Medicine

## 2013-10-11 ENCOUNTER — Encounter: Payer: Self-pay | Admitting: Critical Care Medicine

## 2013-10-11 ENCOUNTER — Ambulatory Visit (INDEPENDENT_AMBULATORY_CARE_PROVIDER_SITE_OTHER): Payer: Medicare HMO | Admitting: Critical Care Medicine

## 2013-10-11 VITALS — BP 120/60 | HR 88 | Temp 97.1°F | Ht 69.0 in | Wt 178.4 lb

## 2013-10-11 DIAGNOSIS — J441 Chronic obstructive pulmonary disease with (acute) exacerbation: Secondary | ICD-10-CM

## 2013-10-11 DIAGNOSIS — Z23 Encounter for immunization: Secondary | ICD-10-CM

## 2013-10-11 NOTE — Patient Instructions (Signed)
Finish prednisone Flu vaccine was given No change in medications Return 3 months

## 2013-10-11 NOTE — Progress Notes (Signed)
Subjective:    Patient ID: Chad [name], male    DOB: [birth year], 80 y.o.   MRN: [id number]  HPI 10/11/2013 Chief Complaint  Patient presents with  . Follow-up    coughing up clear mucus   Clear mucus now, not as bad.  Not discolored. Dyspnea is stable. Pt denies any significant sore throat, nasal congestion or excess secretions, fever, chills, sweats, unintended weight loss, pleurtic or exertional chest pain, orthopnea PND, or leg swelling Pt denies any increase in rescue therapy over baseline, denies waking up needing it or having any early am or nocturnal exacerbations of coughing/wheezing/or dyspnea. Pt also denies any obvious fluctuation in symptoms with  weather or environmental change or other alleviating or aggravating factors  Pt just got off pred and abx injection one week ago per pcp. The pred has helped.  Has a few left to take  Review of Systems Constitutional:   No  weight loss, night sweats,  Fevers, chills, fatigue, lassitude. HEENT:   No headaches,  Difficulty swallowing,  Tooth/dental problems,  Sore throat,                No sneezing, itching, ear ache, nasal congestion, post nasal drip,   CV:  No chest pain,  Orthopnea, PND, swelling in lower extremities, anasarca, dizziness, palpitations  GI  No heartburn, indigestion, abdominal pain, nausea, vomiting, diarrhea, change in bowel habits, loss of appetite  Resp: Notes shortness of breath with exertion not at rest.  No excess mucus, no productive cough,  No non-productive cough,  No coughing up of blood.  No change in color of mucus.  No wheezing.  No chest wall deformity  Skin: no rash or lesions.  GU: no dysuria, change in color of urine, no urgency or frequency.  No flank pain.  MS:  No joint pain or swelling.  No decreased range of motion.  No back pain.  Psych:  No change in mood or affect. No depression or anxiety.  No memory loss.     Objective:   Physical Exam Filed Vitals:   10/11/13 1141  BP:  120/60  Pulse: 88  Temp: 97.1 F (36.2 C)  Height: 5\' 9"  (1.753 m)  Weight: 178 lb 6.4 oz (80.922 kg)  SpO2: 96%    Gen: Pleasant, well-nourished, in no distress,  normal affect  ENT: No lesions,  mouth clear,  oropharynx clear, no postnasal drip  Neck: No JVD, no TMG, no carotid bruits  Lungs: No use of accessory muscles, no dullness to percussion, distant breath sound  Cardiovascular: RRR, heart sounds normal, no murmur or gallops, no peripheral edema  Abdomen: soft and NT, no HSM,  BS normal, slight distention with abdominal binder in place  Musculoskeletal: No deformities, no cyanosis or clubbing  Neuro: alert, non focal  Skin: Warm, no lesions or rashes  No results found.     Assessment & Plan:   Obstructive chronic bronchitis with exacerbation, Copd Gold C Gold stage C. COPD with frequent exacerbations now improved Plan Taper prednisone further to off Flu vaccine was given No change in medications Return 3 months    Updated Medication List Outpatient Encounter Prescriptions as of 10/11/2013  Medication Sig  . albuterol (PROAIR HFA) 108 (90 BASE) MCG/ACT inhaler Inhale 1-2 puffs into the lungs every 6 (six) hours as needed. For breathing  . amLODipine (NORVASC) 5 MG tablet Take 5 mg by mouth daily.    Marland Kitchen aspirin 81 MG tablet Take 81 mg by mouth  daily.    . brimonidine (ALPHAGAN P) 0.1 % SOLN Place 1 drop into the left eye 2 (two) times daily.    . Cholecalciferol (VITAMIN D) 1000 UNITS capsule Take 1,000 Units by mouth daily.    . cloNIDine (CATAPRES) 0.2 MG tablet 1 tab by mouth as needed when BP is over 140   . diazepam (VALIUM) 5 MG tablet Take 2.5 mg by mouth daily as needed. For anxiety  . formoterol (FORADIL AEROLIZER) 12 MCG capsule for inhaler Place 12 mcg into inhaler and inhale 2 (two) times daily.  Marland Kitchen glipiZIDE (GLUCOTROL XL) 2.5 MG 24 hr tablet Take 2.5 mg by mouth daily.  Marland Kitchen loratadine (CLARITIN) 10 MG tablet Take 10 mg by mouth daily.  Marland Kitchen losartan  (COZAAR) 100 MG tablet Take 100 mg by mouth daily.    . metFORMIN (GLUCOPHAGE) 500 MG tablet Take 1,000 mg by mouth 2 (two) times daily with a meal.    . naproxen sodium (ANAPROX) 220 MG tablet Take 220 mg by mouth as needed.  Marland Kitchen omeprazole (PRILOSEC) 20 MG capsule Take 40 mg by mouth 2 (two) times daily.   . Potassium 99 MG TABS Take 1 tablet by mouth daily.    . saw palmetto 500 MG capsule Take 500 mg by mouth daily.    . simvastatin (ZOCOR) 80 MG tablet Take 40 mg by mouth at bedtime.   Marland Kitchen tiotropium (SPIRIVA) 18 MCG inhalation capsule Place 18 mcg into inhaler and inhale daily.    . valACYclovir (VALTREX) 500 MG tablet Take 1,000 mg by mouth every 8 (eight) hours as needed. For out breaks  . Zinc 100 MG TABS Take 1 tablet by mouth daily.    . fluticasone (FLONASE) 50 MCG/ACT nasal spray Place 2 sprays into both nostrils daily.  . [DISCONTINUED] beclomethasone (QVAR) 80 MCG/ACT inhaler Inhale 2 puffs into the lungs 2 (two) times daily.  . [DISCONTINUED] doxycycline (VIBRA-TABS) 100 MG tablet Take 1 tablet (100 mg total) by mouth 2 (two) times daily.

## 2013-10-12 NOTE — Assessment & Plan Note (Signed)
Gold stage C. COPD with frequent exacerbations now improved Plan Taper prednisone further to off Flu vaccine was given No change in medications Return 3 months

## 2013-10-23 LAB — HM DIABETES EYE EXAM

## 2013-11-19 ENCOUNTER — Telehealth: Payer: Self-pay | Admitting: Pulmonary Disease

## 2013-11-19 ENCOUNTER — Encounter: Payer: Self-pay | Admitting: Pulmonary Disease

## 2013-11-19 ENCOUNTER — Ambulatory Visit (INDEPENDENT_AMBULATORY_CARE_PROVIDER_SITE_OTHER): Payer: Medicare HMO | Admitting: Pulmonary Disease

## 2013-11-19 ENCOUNTER — Ambulatory Visit (INDEPENDENT_AMBULATORY_CARE_PROVIDER_SITE_OTHER)
Admission: RE | Admit: 2013-11-19 | Discharge: 2013-11-19 | Disposition: A | Discharge status: Home / Self Care | Payer: Medicare HMO | Source: Ambulatory Visit | Attending: Pulmonary Disease | Admitting: Pulmonary Disease

## 2013-11-19 VITALS — BP 134/72 | HR 85 | Temp 98.8°F | Ht 69.0 in | Wt 184.0 lb

## 2013-11-19 DIAGNOSIS — J441 Chronic obstructive pulmonary disease with (acute) exacerbation: Secondary | ICD-10-CM

## 2013-11-19 MED ORDER — PREDNISONE 10 MG PO TABS
ORAL_TABLET | ORAL | Status: DC
Start: 2013-11-19 — End: 2013-11-25

## 2013-11-19 MED ORDER — CLARITHROMYCIN 500 MG PO TABS: 500.0000 mg | ORAL_TABLET | Freq: Two times a day (BID) | ORAL | Status: DC

## 2013-11-19 NOTE — Telephone Encounter (Signed)
Per [facility]:  PT needs to start on Biaxin and Pred taper asap and if he feels that his breathing is that acute he will need to go to ED  Instructed pt and his wife of Dr Janifer Adie recommendations.  They verbalized understanding.

## 2013-11-19 NOTE — Progress Notes (Signed)
   Subjective:    Patient ID: Chad [name], male    DOB: 02-21-1929, 80 y.o.   MRN: [id number]  HPI The patient comes in today for an acute sick visit. He is normally followed by Dr. Joya Gaskins for Gold C COPD. He gives a one-day history of increasing chest congestion, cough with nonpurulent mucus so far, as well as increased shortness of breath with wheezing.  He has also had low-grade fever. He has not been around anyone with the flu, and denies any myalgias, arthralgias, or nasal symptoms. He also denies any chest discomfort.   Review of Systems  Constitutional: Positive for fever. Negative for unexpected weight change.  HENT: Negative for congestion, dental problem, ear pain, nosebleeds, postnasal drip, rhinorrhea, sinus pressure, sneezing, sore throat and trouble swallowing.   Eyes: Negative for redness and itching.  Respiratory: Positive for cough, shortness of breath and wheezing. Negative for chest tightness.   Cardiovascular: Negative for palpitations and leg swelling.  Gastrointestinal: Negative for nausea and vomiting.  Genitourinary: Negative for dysuria.  Musculoskeletal: Negative for joint swelling.  Skin: Negative for rash.  Neurological: Negative for headaches.  Hematological: Does not bruise/bleed easily.  Psychiatric/Behavioral: Negative for dysphoric mood. The patient is not nervous/anxious.        Objective:   Physical Exam Frail-appearing male in no acute distress Nose without purulence or discharge noted Oropharynx clear Neck without lymphadenopathy or thyromegaly Chest with diffuse wheezing and rhonchi, no crackles Cardiac exam with regular rate and rhythm, 2/6 systolic murmur Lower extremities with mild edema, no cyanosis Alert and oriented, moves all 4 extremities.       Assessment & Plan:

## 2013-11-19 NOTE — Patient Instructions (Signed)
Will check a chest xray today, and call you with results. Will treat you with biaxin 500mg  one two times a day for 5 days, and an 8 day course of prednisone.  Would like for you to followup with either Dr. Joya Gaskins or our nurse practioner in 2 weeks. Please call if you are not improving.

## 2013-11-19 NOTE — Assessment & Plan Note (Signed)
The patient has bronchospasm on exam with increasing shortness of breath consistent with a COPD exacerbation. I suspect this is either related to a viral or bacterial pulmonary infection, and will check a chest x-ray to help direct antibiotic duration. The patient is allergic to penicillin, fluoroquinolones, and sulfa. We'll therefore treat him with a course of Biaxin. This will need to be extended if he indeed has pneumonia radiographically.

## 2013-11-20 NOTE — Progress Notes (Signed)
Quick Note:  Pt aware of results. ______ 

## 2013-11-25 ENCOUNTER — Telehealth: Payer: Self-pay | Admitting: Adult Health

## 2013-11-25 ENCOUNTER — Ambulatory Visit (INDEPENDENT_AMBULATORY_CARE_PROVIDER_SITE_OTHER): Payer: Medicare HMO | Admitting: Adult Health

## 2013-11-25 ENCOUNTER — Encounter: Payer: Self-pay | Admitting: Adult Health

## 2013-11-25 VITALS — BP 140/72 | HR 87 | Temp 98.1°F | Ht 69.0 in | Wt 189.0 lb

## 2013-11-25 DIAGNOSIS — J441 Chronic obstructive pulmonary disease with (acute) exacerbation: Secondary | ICD-10-CM

## 2013-11-25 MED ORDER — ALBUTEROL SULFATE (2.5 MG/3ML) 0.083% IN NEBU: INHALATION_SOLUTION | RESPIRATORY_TRACT | Status: DC

## 2013-11-25 MED ORDER — PREDNISONE 10 MG PO TABS: ORAL_TABLET | ORAL | Status: DC

## 2013-11-25 NOTE — Progress Notes (Signed)
   Subjective:    Patient ID: Chad [name], male    DOB: [birth year], 80 y.o.   MRN: [id number]  HPI  80 yo male with COPD primary emphysematous component.  Prior esophagectomy 2007 and abn CXR since that surgery.   11/25/2013 Acute OV  Pt returns for persistent symptoms of cough and wheezing.  Was seen last week for COPD exacerbation, tx w/ Biaxin and pred pack  CXR showed no acute process.  He does not feel he is much better. Still has cough and wheezing.  Finished abx and prednisone yesterday. Minimal discolored mucus.  No fever, chest pain , hemoptyisis, leg swelling or n/v.  Remains Spiriva and Foradil .     Review of Systems Constitutional:   No  weight loss, night sweats,  Fevers, chills, + fatigue, or  lassitude.  HEENT:   No headaches,  Difficulty swallowing,  Tooth/dental problems, or  Sore throat,                No sneezing, itching, ear ache, nasal congestion, post nasal drip,   CV:  No chest pain,  Orthopnea, PND, swelling in lower extremities, anasarca, dizziness, palpitations, syncope.   GI  No heartburn, indigestion, abdominal pain, nausea, vomiting, diarrhea, change in bowel habits, loss of appetite, bloody stools.   Resp:    No wheezing.  No chest wall deformity  Skin: no rash or lesions.  GU: no dysuria, change in color of urine, no urgency or frequency.  No flank pain, no hematuria   MS:  No joint pain or swelling.  No decreased range of motion.  No back pain.  Psych:  No change in mood or affect. No depression or anxiety.  No memory loss.         Objective:   Physical Exam GEN: A/Ox3; pleasant , NAD, overweight   HEENT:  [place]/AT,  EACs-clear, TMs-wnl, NOSE-clear, THROAT-clear, no lesions, no postnasal drip or exudate noted.   NECK:  Supple w/ fair ROM; no JVD; normal carotid impulses w/o bruits; no thyromegaly or nodules palpated; no lymphadenopathy.  RESP  Decreased BS w/ exp wheezing no accessory muscle use, no dullness to  percussion  CARD:  RRR, no m/r/g  , no peripheral edema, pulses intact, no cyanosis or clubbing.  GI:   BS+ , abd binder in place, no organomegaly or masses detected.  Musco: Warm bil, no deformities or joint swelling noted.   Neuro: alert, no focal deficits noted.    Skin: Warm, no lesions or rashes         Assessment & Plan:

## 2013-11-25 NOTE — Patient Instructions (Signed)
Prednisone taper over the next week Mucinex DM twice daily as needed for cough and congestion. May use albuterol nebulizer every 4-6 hours as needed for wheezing Follow Dr. Joya Gaskins in 6 weeks and as needed Please contact office for sooner follow up if symptoms do not improve or worsen or seek emergency care

## 2013-11-25 NOTE — Addendum Note (Signed)
Addended by: Parke Poisson E on: 11/25/2013 05:50 PM   Modules accepted: Orders

## 2013-11-25 NOTE — Assessment & Plan Note (Signed)
Slow to resolve flare  cxr last week with no acute changes  xopenex neb x 1  No further abx at this time   Plan  Prednisone taper over the next week Mucinex DM twice daily as needed for cough and congestion. May use albuterol nebulizer every 4-6 hours as needed for wheezing Follow Dr. Joya Gaskins in 6 weeks and as needed Please contact office for sooner follow up if symptoms do not improve or worsen or seek emergency care

## 2013-11-25 NOTE — Addendum Note (Signed)
Addended by: Parke Poisson E on: 11/25/2013 05:08 PM   Modules accepted: Orders

## 2013-11-25 NOTE — Telephone Encounter (Signed)
Spoke with the pt's spouse  Pred taper never sent to the pharm  I spoke with JJ and she is going to send  Nothing further needed

## 2013-11-26 MED ORDER — PREDNISONE 10 MG PO TABS: ORAL_TABLET | ORAL | Status: DC

## 2013-11-26 MED ORDER — LEVALBUTEROL HCL 0.63 MG/3ML IN NEBU
0.6300 mg | INHALATION_SOLUTION | Freq: Once | RESPIRATORY_TRACT | Status: AC
  Administered 2013-11-25: 0.63 mg via RESPIRATORY_TRACT

## 2013-11-26 NOTE — Telephone Encounter (Signed)
Called pt and aware RX has been sent in. Nothing further needed 

## 2013-11-26 NOTE — Telephone Encounter (Signed)
Wife is calling back stating prednisone rx still has not been sent to pharmacy.  Chad [name]

## 2013-11-26 NOTE — Telephone Encounter (Signed)
Patient is needing prednisone rx.  Patient saw TP yesterday and states rx did not get  To the pharmacy.  [employer] [facility]  [phone]

## 2013-11-26 NOTE — Addendum Note (Signed)
Addended by: Parke Poisson E on: 11/26/2013 05:18 PM   Modules accepted: Orders

## 2013-12-03 ENCOUNTER — Ambulatory Visit: Payer: Medicare HMO | Admitting: Adult Health

## 2013-12-12 HISTORY — PX: EYE SURGERY: SHX253

## 2013-12-13 ENCOUNTER — Encounter: Payer: Self-pay | Admitting: Adult Health

## 2013-12-13 ENCOUNTER — Ambulatory Visit (INDEPENDENT_AMBULATORY_CARE_PROVIDER_SITE_OTHER): Payer: Medicare HMO | Admitting: Adult Health

## 2013-12-13 VITALS — BP 110/86 | HR 86 | Temp 97.0°F | Ht 69.0 in | Wt 182.2 lb

## 2013-12-13 DIAGNOSIS — J441 Chronic obstructive pulmonary disease with (acute) exacerbation: Secondary | ICD-10-CM

## 2013-12-13 NOTE — Patient Instructions (Signed)
Begin Allegra 180mg  daily  May use Chlortrimeton 4mg  2 At bedtime  As needed  Drainage .  Saline nasal rinses As needed   Mucinex DM twice daily as needed for cough and congestion. May use albuterol nebulizer every 4-6 hours as needed for wheezing Follow Dr. Joya Gaskins in 6 -8 weeks and as needed Please contact office for sooner follow up if symptoms do not improve or worsen or seek emergency care

## 2013-12-13 NOTE — Progress Notes (Signed)
   Subjective:    Patient ID: Chad [name], male    DOB: [birth year], 80 y.o.   MRN: [id number]  HPI   80 yo male with COPD primary emphysematous component.  Prior esophagectomy 2007 and abn CXR since that surgery.   11/25/13 Acute OV  Pt returns for persistent symptoms of cough and wheezing.  Was seen last week for COPD exacerbation, tx w/ Biaxin and pred pack  CXR showed no acute process.  He does not feel he is much better. Still has cough and wheezing.  Finished abx and prednisone yesterday. Minimal discolored mucus.  No fever, chest pain , hemoptyisis, leg swelling or n/v.  Remains Spiriva and Foradil .  >>pred taper   12/13/2013 Acute OV  Returns for persistent symptoms of cough and wheezing .  Was seen 2 weeks ago with COPD flare, given pred taper with improvement in symptoms  Complains of runny nose, some cough with clear mucus, head congestion w/ PND, hoarseness x3 days.  Denies f/c/s, n/v/d, hemoptysis, leg swelling, discolored mucus , orthopnea, or edema.  Overall feels some better but worried that he is catching a cold.       Review of Systems  Constitutional:   No  weight loss, night sweats,  Fevers, chills, + fatigue, or  lassitude.  HEENT:   No headaches,  Difficulty swallowing,  Tooth/dental problems, or  Sore throat,                No sneezing, itching, ear ache,  +nasal congestion, post nasal drip,   CV:  No chest pain,  Orthopnea, PND, swelling in lower extremities, anasarca, dizziness, palpitations, syncope.   GI  No heartburn, indigestion, abdominal pain, nausea, vomiting, diarrhea, change in bowel habits, loss of appetite, bloody stools.   Resp:   No chest wall deformity  Skin: no rash or lesions.  GU: no dysuria, change in color of urine, no urgency or frequency.  No flank pain, no hematuria   MS:  No joint pain or swelling.  No decreased range of motion.  No back pain.  Psych:  No change in mood or affect. No depression or anxiety.  No  memory loss.         Objective:   Physical Exam  GEN: A/Ox3; pleasant , NAD, overweight   HEENT:  [place]/AT,  EACs-clear, TMs-wnl, NOSE-clear, THROAT-clear, no lesions, no postnasal drip or exudate noted.   NECK:  Supple w/ fair ROM; no JVD; normal carotid impulses w/o bruits; no thyromegaly or nodules palpated; no lymphadenopathy.  RESP  Decreased BS w/ no wheezing  no accessory muscle use, no dullness to percussion  CARD:  RRR, no m/r/g  , no peripheral edema, pulses intact, no cyanosis or clubbing.  GI:   BS+ , abd binder in place, no organomegaly or masses detected.  Musco: Warm bil, no deformities or joint swelling noted.   Neuro: alert, no focal deficits noted.    Skin: Warm, no lesions or rashes         Assessment & Plan:

## 2013-12-16 ENCOUNTER — Telehealth: Payer: Self-pay | Admitting: Critical Care Medicine

## 2013-12-16 NOTE — Assessment & Plan Note (Signed)
Mild flare with AR   Plan  Begin Allegra 180mg  daily  May use Chlortrimeton 4mg  2 At bedtime  As needed  Drainage .  Saline nasal rinses As needed   Mucinex DM twice daily as needed for cough and congestion. May use albuterol nebulizer every 4-6 hours as needed for wheezing Follow Dr. Joya Gaskins in 6 -8 weeks and as needed Please contact office for sooner follow up if symptoms do not improve or worsen or seek emergency care

## 2013-12-16 NOTE — Telephone Encounter (Signed)
Spoke with pharmacist at [facility] and she states she can try to run this through his part B ( number is [phone]).  She would like [place] to call back later to see if this went through.  Will call back.

## 2013-12-17 NOTE — Telephone Encounter (Signed)
Spoke with pt's spouse and informed her that the next time pt needs a refill they need to tell pharmacist to tun it through medicare part b.  She verbalized understanding.

## 2013-12-19 ENCOUNTER — Inpatient Hospital Stay (HOSPITAL_COMMUNITY)
Admission: EM | Admit: 2013-12-19 | Discharge: 2013-12-23 | DRG: 192 | Disposition: A | Discharge status: Home / Self Care | Payer: Medicare HMO | Attending: Family Medicine | Admitting: Family Medicine

## 2013-12-19 ENCOUNTER — Encounter (HOSPITAL_COMMUNITY): Payer: Self-pay | Admitting: Emergency Medicine

## 2013-12-19 ENCOUNTER — Emergency Department (HOSPITAL_COMMUNITY): Payer: Medicare HMO

## 2013-12-19 DIAGNOSIS — Z87891 Personal history of nicotine dependence: Secondary | ICD-10-CM

## 2013-12-19 DIAGNOSIS — Z888 Allergy status to other drugs, medicaments and biological substances status: Secondary | ICD-10-CM

## 2013-12-19 DIAGNOSIS — Z882 Allergy status to sulfonamides status: Secondary | ICD-10-CM | POA: Diagnosis not present

## 2013-12-19 DIAGNOSIS — H409 Unspecified glaucoma: Secondary | ICD-10-CM | POA: Diagnosis present

## 2013-12-19 DIAGNOSIS — Z800 Family history of malignant neoplasm of digestive organs: Secondary | ICD-10-CM

## 2013-12-19 DIAGNOSIS — E1165 Type 2 diabetes mellitus with hyperglycemia: Secondary | ICD-10-CM | POA: Diagnosis present

## 2013-12-19 DIAGNOSIS — J441 Chronic obstructive pulmonary disease with (acute) exacerbation: Principal | ICD-10-CM | POA: Diagnosis present

## 2013-12-19 DIAGNOSIS — Z85828 Personal history of other malignant neoplasm of skin: Secondary | ICD-10-CM

## 2013-12-19 DIAGNOSIS — I2510 Atherosclerotic heart disease of native coronary artery without angina pectoris: Secondary | ICD-10-CM | POA: Diagnosis present

## 2013-12-19 DIAGNOSIS — Z8673 Personal history of transient ischemic attack (TIA), and cerebral infarction without residual deficits: Secondary | ICD-10-CM

## 2013-12-19 DIAGNOSIS — R0602 Shortness of breath: Secondary | ICD-10-CM

## 2013-12-19 DIAGNOSIS — Z7982 Long term (current) use of aspirin: Secondary | ICD-10-CM

## 2013-12-19 DIAGNOSIS — J440 Chronic obstructive pulmonary disease with acute lower respiratory infection: Secondary | ICD-10-CM | POA: Diagnosis present

## 2013-12-19 DIAGNOSIS — Z801 Family history of malignant neoplasm of trachea, bronchus and lung: Secondary | ICD-10-CM

## 2013-12-19 DIAGNOSIS — K219 Gastro-esophageal reflux disease without esophagitis: Secondary | ICD-10-CM | POA: Diagnosis present

## 2013-12-19 DIAGNOSIS — Z880 Allergy status to penicillin: Secondary | ICD-10-CM

## 2013-12-19 DIAGNOSIS — Z8249 Family history of ischemic heart disease and other diseases of the circulatory system: Secondary | ICD-10-CM

## 2013-12-19 DIAGNOSIS — J189 Pneumonia, unspecified organism: Secondary | ICD-10-CM

## 2013-12-19 DIAGNOSIS — E785 Hyperlipidemia, unspecified: Secondary | ICD-10-CM | POA: Diagnosis present

## 2013-12-19 DIAGNOSIS — J449 Chronic obstructive pulmonary disease, unspecified: Secondary | ICD-10-CM | POA: Diagnosis present

## 2013-12-19 DIAGNOSIS — I10 Essential (primary) hypertension: Secondary | ICD-10-CM | POA: Diagnosis present

## 2013-12-19 DIAGNOSIS — K222 Esophageal obstruction: Secondary | ICD-10-CM

## 2013-12-19 DIAGNOSIS — E119 Type 2 diabetes mellitus without complications: Secondary | ICD-10-CM

## 2013-12-19 HISTORY — DX: Personal history of peptic ulcer disease: Z87.11

## 2013-12-19 HISTORY — DX: Personal history of other diseases of the digestive system: Z87.19

## 2013-12-19 HISTORY — DX: Headache, unspecified: R51.9

## 2013-12-19 HISTORY — DX: Calculus of kidney: N20.0

## 2013-12-19 HISTORY — DX: Esophageal obstruction: K22.2

## 2013-12-19 HISTORY — DX: Unspecified chronic bronchitis: J42

## 2013-12-19 HISTORY — DX: Headache: R51

## 2013-12-19 HISTORY — DX: Type 2 diabetes mellitus without complications: E11.9

## 2013-12-19 HISTORY — DX: Personal history of other medical treatment: Z92.89

## 2013-12-19 HISTORY — DX: Basal cell carcinoma of skin of nose: C44.311

## 2013-12-19 HISTORY — DX: Pneumonia, unspecified organism: J18.9

## 2013-12-19 LAB — I-STAT VENOUS BLOOD GAS, ED
Acid-base deficit: 3 mmol/L — ABNORMAL HIGH (ref 0.0–2.0)
BICARBONATE: 23.8 meq/L (ref 20.0–24.0)
O2 SAT: 74 %
PCO2 VEN: 48.5 mmHg (ref 45.0–50.0)
TCO2: 25 mmol/L (ref 0–100)
pH, Ven: 7.299 (ref 7.250–7.300)
pO2, Ven: 44 mmHg (ref 30.0–45.0)

## 2013-12-19 LAB — CBC WITH DIFFERENTIAL/PLATELET
BASOS ABS: 0.1 10*3/uL (ref 0.0–0.1)
Basophils Relative: 1 % (ref 0–1)
EOS PCT: 8 % — AB (ref 0–5)
Eosinophils Absolute: 0.8 10*3/uL — ABNORMAL HIGH (ref 0.0–0.7)
HCT: 41.6 % (ref 39.0–52.0)
Hemoglobin: 13.1 g/dL (ref 13.0–17.0)
LYMPHS PCT: 26 % (ref 12–46)
Lymphs Abs: 2.6 10*3/uL (ref 0.7–4.0)
MCH: 28.4 pg (ref 26.0–34.0)
MCHC: 31.5 g/dL (ref 30.0–36.0)
MCV: 90.2 fL (ref 78.0–100.0)
Monocytes Absolute: 0.7 10*3/uL (ref 0.1–1.0)
Monocytes Relative: 7 % (ref 3–12)
NEUTROS ABS: 5.8 10*3/uL (ref 1.7–7.7)
Neutrophils Relative %: 58 % (ref 43–77)
Platelets: 247 10*3/uL (ref 150–400)
RBC: 4.61 MIL/uL (ref 4.22–5.81)
RDW: 16.9 % — AB (ref 11.5–15.5)
WBC: 9.8 10*3/uL (ref 4.0–10.5)

## 2013-12-19 LAB — COMPREHENSIVE METABOLIC PANEL
ALT: 31 U/L (ref 0–53)
AST: 25 U/L (ref 0–37)
Albumin: 3.9 g/dL (ref 3.5–5.2)
Alkaline Phosphatase: 57 U/L (ref 39–117)
Anion gap: 22 — ABNORMAL HIGH (ref 5–15)
BUN: 16 mg/dL (ref 6–23)
CHLORIDE: 99 meq/L (ref 96–112)
CO2: 18 meq/L — AB (ref 19–32)
Calcium: 9.9 mg/dL (ref 8.4–10.5)
Creatinine, Ser: 1.12 mg/dL (ref 0.50–1.35)
GFR calc Af Amer: 69 mL/min — ABNORMAL LOW (ref >90)
GFR, EST NON AFRICAN AMERICAN: 59 mL/min — AB (ref >90)
Glucose, Bld: 196 mg/dL — ABNORMAL HIGH (ref 70–99)
Potassium: 4.1 mEq/L (ref 3.7–5.3)
Sodium: 139 mEq/L (ref 137–147)
Total Bilirubin: 0.4 mg/dL (ref 0.3–1.2)
Total Protein: 7 g/dL (ref 6.0–8.3)

## 2013-12-19 MED ORDER — DOXYCYCLINE HYCLATE 100 MG IV SOLR
100.0000 mg | Freq: Once | INTRAVENOUS | Status: AC
  Administered 2013-12-19: 100 mg via INTRAVENOUS
  Filled 2013-12-19: qty 100

## 2013-12-19 MED ORDER — IPRATROPIUM BROMIDE 0.02 % IN SOLN
RESPIRATORY_TRACT | Status: AC
  Administered 2013-12-19: 1 mg via RESPIRATORY_TRACT
  Filled 2013-12-19: qty 5

## 2013-12-19 MED ORDER — ALBUTEROL SULFATE (2.5 MG/3ML) 0.083% IN NEBU
INHALATION_SOLUTION | RESPIRATORY_TRACT | Status: AC
  Filled 2013-12-19: qty 6

## 2013-12-19 MED ORDER — IPRATROPIUM BROMIDE 0.02 % IN SOLN
1.0000 mg | RESPIRATORY_TRACT | Status: AC
  Administered 2013-12-19: 1 mg via RESPIRATORY_TRACT

## 2013-12-19 MED ORDER — ALBUTEROL (5 MG/ML) CONTINUOUS INHALATION SOLN: 10.0000 mg/h | INHALATION_SOLUTION | Freq: Once | RESPIRATORY_TRACT | Status: DC

## 2013-12-19 MED ORDER — ALBUTEROL (5 MG/ML) CONTINUOUS INHALATION SOLN
INHALATION_SOLUTION | RESPIRATORY_TRACT | Status: AC
  Administered 2013-12-19: 10 mg/h
  Filled 2013-12-19: qty 20

## 2013-12-19 MED ORDER — ALBUTEROL (5 MG/ML) CONTINUOUS INHALATION SOLN: 10.0000 mg/h | INHALATION_SOLUTION | RESPIRATORY_TRACT | Status: DC

## 2013-12-19 MED ORDER — SODIUM CHLORIDE 0.9 % IV SOLN
1000.0000 mL | Freq: Once | INTRAVENOUS | Status: AC
  Administered 2013-12-19: 1000 mL via INTRAVENOUS

## 2013-12-19 NOTE — ED Notes (Signed)
hospitalist at the bedside 

## 2013-12-19 NOTE — ED Notes (Signed)
Pt brought by EMS from home, for SOB, pt having SOB x 6 weeks with no relief with Prednisone and rescue inhaler at home. Pt O2 saturation 82% on RA at EMS arrival and placed on CPAP. 2 g Mg, 1 dueneb, 125 mg of Solumedrol IV given with some relief. Pt still on BiPaP on ER and still  Having bilateral wz.

## 2013-12-19 NOTE — ED Provider Notes (Signed)
CSN: [id number]     Arrival date & time 12/19/13  1953 History   First MD Initiated Contact with Patient 12/19/13 2000     Chief Complaint  Patient presents with  . Shortness of Breath     (Consider location/radiation/quality/duration/timing/severity/associated sxs/prior Treatment) Patient is a 80 y.o. male presenting with shortness of breath. The history is provided by the patient, the spouse and the EMS personnel.  Shortness of Breath Severity:  Mild Duration:  6 weeks Timing:  Intermittent Progression:  Waxing and waning Chronicity:  New Context: not activity, not pollens and not URI   Relieved by: steroids. Ineffective treatments:  Inhaler Associated symptoms: cough, sputum production and wheezing   Associated symptoms: no abdominal pain, no chest pain, no fever, no headaches, no hemoptysis, no rash and no vomiting   Risk factors: tobacco use     Past Medical History  Diagnosis Date  . Fatty liver disease, nonalcoholic   . Coronary artery stenosis   . Diverticulosis of colon   . Diaphragmatic hernia without mention of obstruction or gangrene   . Dyspnea   . COPD (chronic obstructive pulmonary disease)   . Personal history of other malignant neoplasm of skin   . Barrett's esophagus   . Arthritis   . TIA (transient ischemic attack)   . Diabetes mellitus   . Hyperlipidemia   . Acid reflux disease   . Glaucoma   . HTN (hypertension)   . RENAL CALCULUS, HX OF 08/28/2008  . CAROTID ARTERY STENOSIS, BILATERAL 08/27/2008   Past Surgical History  Procedure Laterality Date  . Esophagectomy  2007    with stomach pull through  . Eye surgery    . Ventral hernia repair  2008  . Ortho knee both knees    . Carotid artery angioplasty     Family History  Problem Relation Age of Onset  . Colon cancer Mother   . Colon cancer Maternal Grandfather   . Lung cancer Mother   . Heart attack Mother   . Heart attack Brother   . Heart attack Brother   . Stomach cancer Father   .  Esophageal cancer Father    History  Substance Use Topics  . Smoking status: Former Smoker -- 1.00 packs/day for 40 years    Types: Cigarettes    Quit date: 01/24/1978  . Smokeless tobacco: Former Systems developer  . Alcohol Use: No    Review of Systems  Constitutional: Negative for fever and chills.  HENT: Negative for congestion.   Eyes: Negative for photophobia.  Respiratory: Positive for cough, sputum production, shortness of breath and wheezing. Negative for hemoptysis.   Cardiovascular: Negative for chest pain.  Gastrointestinal: Negative for nausea, vomiting and abdominal pain.  Genitourinary: Negative for dysuria and flank pain.  Musculoskeletal: Negative for back pain.  Skin: Negative for rash.  Neurological: Negative for headaches.  All other systems reviewed and are negative.     Allergies  Moxifloxacin; Penicillins; Sulfonamide derivatives; and Timolol maleate  Home Medications   Prior to Admission medications   Medication Sig Start Date End Date Taking? Authorizing Provider  albuterol (PROAIR HFA) 108 (90 BASE) MCG/ACT inhaler Inhale 1-2 puffs into the lungs every 6 (six) hours as needed. For breathing   Yes Historical Provider, MD  albuterol (PROVENTIL) (2.5 MG/3ML) 0.083% nebulizer solution 1 vial in nebulizer every 4-6 hours as needed for wheezing/shortness of breath. 11/25/13  Yes Tammy S Parrett, NP  amLODipine (NORVASC) 5 MG tablet Take 5 mg by mouth daily.  Yes Historical Provider, MD  aspirin 81 MG tablet Take 81 mg by mouth daily.     Yes Historical Provider, MD  brimonidine (ALPHAGAN P) 0.1 % SOLN Place 1 drop into the left eye 2 (two) times daily.     Yes Historical Provider, MD  chlorpheniramine (CHLOR-TRIMETON) 4 MG tablet Take 4 mg by mouth 2 (two) times daily as needed for allergies.   Yes Historical Provider, MD  Cholecalciferol (VITAMIN D) 1000 UNITS capsule Take 1,000 Units by mouth daily.     Yes Historical Provider, MD  cloNIDine (CATAPRES) 0.2 MG  tablet 1 tab by mouth as needed when BP is over 140    Yes Historical Provider, MD  diazepam (VALIUM) 5 MG tablet Take 2.5 mg by mouth daily as needed. For anxiety   Yes Historical Provider, MD  fluticasone (FLONASE) 50 MCG/ACT nasal spray Place 2 sprays into both nostrils daily. 12/24/12  Yes Elsie Stain, MD  formoterol (FORADIL AEROLIZER) 12 MCG capsule for inhaler Place 12 mcg into inhaler and inhale 2 (two) times daily.   Yes Historical Provider, MD  glipiZIDE (GLUCOTROL XL) 2.5 MG 24 hr tablet Take 2.5 mg by mouth daily.   Yes Historical Provider, MD  guaiFENesin (MUCINEX) 600 MG 12 hr tablet Take 600 mg by mouth 2 (two) times daily as needed.   Yes Historical Provider, MD  HYDROcodone-acetaminophen (NORCO/VICODIN) 5-325 MG per tablet Take 1 tablet by mouth 2 (two) times daily as needed (cough).   Yes Historical Provider, MD  loratadine (CLARITIN) 10 MG tablet Take 10 mg by mouth daily.   Yes Historical Provider, MD  losartan (COZAAR) 100 MG tablet Take 100 mg by mouth daily.     Yes Historical Provider, MD  metFORMIN (GLUCOPHAGE) 500 MG tablet Take 1,000 mg by mouth 2 (two) times daily with a meal.     Yes Historical Provider, MD  naproxen sodium (ANAPROX) 220 MG tablet Take 220 mg by mouth as needed.   Yes Historical Provider, MD  omeprazole (PRILOSEC) 20 MG capsule Take 40 mg by mouth 2 (two) times daily.    Yes Historical Provider, MD  Potassium 99 MG TABS Take 1 tablet by mouth daily.     Yes Historical Provider, MD  saw palmetto 500 MG capsule Take 500 mg by mouth daily.     Yes Historical Provider, MD  tiotropium (SPIRIVA) 18 MCG inhalation capsule Place 18 mcg into inhaler and inhale daily.     Yes Historical Provider, MD  valACYclovir (VALTREX) 500 MG tablet Take 1,000 mg by mouth every 8 (eight) hours as needed. For out breaks   Yes Historical Provider, MD  Zinc 100 MG TABS Take 1 tablet by mouth daily.     Yes Historical Provider, MD   BP 135/53 mmHg  Pulse 109  Temp(Src) 97.4  F (36.3 C) (Axillary)  Resp 23  Ht 5\' 9"  (1.753 m)  Wt 183 lb (83.008 kg)  BMI 27.01 kg/m2  SpO2 95% Physical Exam  Constitutional: He is oriented to person, place, and time. He appears well-developed and well-nourished.  HENT:  Head: Normocephalic and atraumatic.  Eyes: Conjunctivae and EOM are normal. Pupils are equal, round, and reactive to light.  Neck: Normal range of motion.  Cardiovascular: Regular rhythm.  Tachycardia present.   Pulmonary/Chest: Accessory muscle usage present. Tachypnea noted. He has decreased breath sounds. He has wheezes.  Abdominal: Soft. He exhibits no distension. There is no tenderness.  Musculoskeletal: Normal range of motion. He exhibits no  edema or tenderness.  Neurological: He is alert and oriented to person, place, and time.  Skin: Skin is warm and dry.  Nursing note and vitals reviewed.   ED Course  Procedures (including critical care time) Labs Review Labs Reviewed  CBC WITH DIFFERENTIAL - Abnormal; Notable for the following:    RDW 16.9 (*)    Eosinophils Relative 8 (*)    Eosinophils Absolute 0.8 (*)    All other components within normal limits  COMPREHENSIVE METABOLIC PANEL - Abnormal; Notable for the following:    CO2 18 (*)    Glucose, Bld 196 (*)    GFR calc non Af Amer 59 (*)    GFR calc Af Amer 69 (*)    Anion gap 22 (*)    All other components within normal limits  I-STAT VENOUS BLOOD GAS, ED - Abnormal; Notable for the following:    Acid-base deficit 3.0 (*)    All other components within normal limits  BLOOD GAS, VENOUS    Imaging Review Dg Chest Port 1 View  12/19/2013   CLINICAL DATA:  Shortness of breath for 6 weeks, hypoxia. History of COPD and nicotine use.  EXAM: PORTABLE CHEST - 1 VIEW  COMPARISON:  Chest radiograph November 19, 2013  FINDINGS: Cardiac silhouette is upper limits of normal size, mediastinal silhouette is nonsuspicious, mildly calcified aortic knob. No pleural effusions or focal consolidations.  Similar hazy density in LEFT lung base may reflect pleural thickening or plaque. Large suspected hiatal hernia. No pneumothorax.  Surgical clip in LEFT neck may reflect thyroidectomy. Patient is osteopenic. Mildly widened RIGHT acromioclavicular joint space may reflect shoulder separation or, projection artifact.  IMPRESSION: Borderline cardiomegaly, no acute pulmonary process.   Electronically Signed   By: Elon Alas   On: 12/19/2013 21:09     EKG Interpretation   Date/Time:  Thursday December 19 2013 19:56:22 EST Ventricular Rate:  125 PR Interval:  163 QRS Duration: 92 QT Interval:  311 QTC Calculation: 448 R Axis:   88 Text Interpretation:  Sinus tachycardia Borderline right axis deviation  Confirmed by Hazle Coca [phone]) on 12/19/2013 8:12:33 PM      MDM   Final diagnoses:  SOB (shortness of breath)    80 yo M w/ copd here with a few weeks of worsening dyspnea, s/p multiple roudns of steroids, antibiotics and albuterol. Today worse than previous, sats 82% on EMS arrival, duo nebs, magnesium, steroids given prior to arrival. Here tachypneic, tachycardic, wheezing, symmetric decreased BS. Duo nebs given again. Likely COPD exacerbation, considered PE but more likely COPD at this time, will treat with abx as had sputum production color and amount change. Antibiotics given, weaned down from bipap to room air but still tachypneic, low sats, and significant wheezing. Worse with movement. Likely needs more albuterol/steroids, will admit to medicine.     Merrily Pew, MD 12/20/13 0006  Quintella Reichert, MD 12/20/13 [phone]

## 2013-12-19 NOTE — H&P (Signed)
[facility] History and Physical  Chad [name] QQI:[id number] DOB: 1930/01/25 DOA: 12/19/2013  Referring physician: ED physician PCP: Myriam Jacobson, MD  Specialists:   Chief Complaint: Worsening shortness of breath and wheezing  HPI: Chad [name] is a 80 y.o. male with past medical history of COPD, hypertension, hyperlipidemia, diabetes mellitus, esophageal stenosis, who presents with worsening shortness of breath and wheezing.  Patient reports that in the past 6 weeks, he has been having worsening shortness of breath and wheezing. He also has cough with small amount of yellow colored sputum production. He does not have fever, chills, or chest pain. He has been treated with two courses of antibiotics (patient does not remember the name of antibiotic) and one course of prednisone, and albuterol nebulizer by his pulmonologist, Dr. Joya Gaskins. He does not have significant improvement in his symptoms, currently still has severe shortness of breath, therefore he called EMS. Pt denies fever, chills, chest pain, abdominal pain, diarrhea, dysuria, urgency, frequency, hematuria, skin rashes, or leg swelling.  Work up in the ED demonstrates negative chest x-ray. No leukocytosis. Patient's shortness of breath improved after short period of BiPAP. Patient is admitted to inpatient for further evaluation and treatment.   Review of Systems: As presented in the history of presenting illness, rest negative.  Where does patient live?  At home Can patient participate in ADLs? Yes  Allergy:  Allergies  Allergen Reactions  . Moxifloxacin Anaphylaxis    Avelox REACTION: anaphylaxis  . Penicillins     REACTION: hives  . Sulfonamide Derivatives     REACTION: hives  . Timolol Maleate     Causes severe chest congestion    Past Medical History  Diagnosis Date  . Fatty liver disease, nonalcoholic   . Coronary artery stenosis   . Diverticulosis of colon   . Diaphragmatic hernia without  mention of obstruction or gangrene   . Dyspnea   . COPD (chronic obstructive pulmonary disease)   . Personal history of other malignant neoplasm of skin   . Barrett's esophagus   . Arthritis   . TIA (transient ischemic attack)   . Diabetes mellitus   . Hyperlipidemia   . Acid reflux disease   . Glaucoma   . HTN (hypertension)   . RENAL CALCULUS, HX OF 08/28/2008  . CAROTID ARTERY STENOSIS, BILATERAL 08/27/2008    Past Surgical History  Procedure Laterality Date  . Esophagectomy  2007    with stomach pull through  . Eye surgery    . Ventral hernia repair  2008  . Ortho knee both knees    . Carotid artery angioplasty      Social History:  reports that he quit smoking about 35 years ago. His smoking use included Cigarettes. He has a 40 pack-year smoking history. He has quit using smokeless tobacco. He reports that he does not drink alcohol or use illicit drugs.  Family History:  Family History  Problem Relation Age of Onset  . Colon cancer Mother   . Colon cancer Maternal Grandfather   . Lung cancer Mother   . Heart attack Mother   . Heart attack Brother   . Heart attack Brother   . Stomach cancer Father   . Esophageal cancer Father      Prior to Admission medications   Medication Sig Start Date End Date Taking? Authorizing Provider  albuterol (PROAIR HFA) 108 (90 BASE) MCG/ACT inhaler Inhale 1-2 puffs into the lungs every 6 (six) hours as needed. For breathing  Yes Historical Provider, MD  albuterol (PROVENTIL) (2.5 MG/3ML) 0.083% nebulizer solution 1 vial in nebulizer every 4-6 hours as needed for wheezing/shortness of breath. 11/25/13  Yes Tammy S Parrett, NP  amLODipine (NORVASC) 5 MG tablet Take 5 mg by mouth daily.     Yes Historical Provider, MD  aspirin 81 MG tablet Take 81 mg by mouth daily.     Yes Historical Provider, MD  brimonidine (ALPHAGAN P) 0.1 % SOLN Place 1 drop into the left eye 2 (two) times daily.     Yes Historical Provider, MD  chlorpheniramine  (CHLOR-TRIMETON) 4 MG tablet Take 4 mg by mouth 2 (two) times daily as needed for allergies.   Yes Historical Provider, MD  Cholecalciferol (VITAMIN D) 1000 UNITS capsule Take 1,000 Units by mouth daily.     Yes Historical Provider, MD  cloNIDine (CATAPRES) 0.2 MG tablet 1 tab by mouth as needed when BP is over 140    Yes Historical Provider, MD  diazepam (VALIUM) 5 MG tablet Take 2.5 mg by mouth daily as needed. For anxiety   Yes Historical Provider, MD  fluticasone (FLONASE) 50 MCG/ACT nasal spray Place 2 sprays into both nostrils daily. 12/24/12  Yes Elsie Stain, MD  formoterol (FORADIL AEROLIZER) 12 MCG capsule for inhaler Place 12 mcg into inhaler and inhale 2 (two) times daily.   Yes Historical Provider, MD  glipiZIDE (GLUCOTROL XL) 2.5 MG 24 hr tablet Take 2.5 mg by mouth daily.   Yes Historical Provider, MD  guaiFENesin (MUCINEX) 600 MG 12 hr tablet Take 600 mg by mouth 2 (two) times daily as needed.   Yes Historical Provider, MD  HYDROcodone-acetaminophen (NORCO/VICODIN) 5-325 MG per tablet Take 1 tablet by mouth 2 (two) times daily as needed (cough).   Yes Historical Provider, MD  loratadine (CLARITIN) 10 MG tablet Take 10 mg by mouth daily.   Yes Historical Provider, MD  losartan (COZAAR) 100 MG tablet Take 100 mg by mouth daily.     Yes Historical Provider, MD  metFORMIN (GLUCOPHAGE) 500 MG tablet Take 1,000 mg by mouth 2 (two) times daily with a meal.     Yes Historical Provider, MD  naproxen sodium (ANAPROX) 220 MG tablet Take 220 mg by mouth as needed.   Yes Historical Provider, MD  omeprazole (PRILOSEC) 20 MG capsule Take 40 mg by mouth 2 (two) times daily.    Yes Historical Provider, MD  Potassium 99 MG TABS Take 1 tablet by mouth daily.     Yes Historical Provider, MD  saw palmetto 500 MG capsule Take 500 mg by mouth daily.     Yes Historical Provider, MD  tiotropium (SPIRIVA) 18 MCG inhalation capsule Place 18 mcg into inhaler and inhale daily.     Yes Historical Provider, MD   valACYclovir (VALTREX) 500 MG tablet Take 1,000 mg by mouth every 8 (eight) hours as needed. For out breaks   Yes Historical Provider, MD  Zinc 100 MG TABS Take 1 tablet by mouth daily.     Yes Historical Provider, MD    Physical Exam: Filed Vitals:   12/19/13 2145 12/19/13 2200 12/19/13 2215 12/19/13 2230  BP: 143/49 139/56 144/54 131/53  Pulse: 113 111 113 110  Temp:      TempSrc:      Resp: 25 23 24 24   Height:      Weight:      SpO2: 100% 94% 93% 91%   General: Not in acute distress HEENT:  Eyes: PERRL, EOMI, no scleral icterus       ENT: No discharge from the ears and nose, no pharynx injection, no tonsillar enlargement.        Neck: No JVD, no bruit, no mass felt. Cardiac: S1/S2, RRR, No murmurs, No gallops or rubs Pulm: Decreased air movement bilaterally. Wheezing bilaterally. Abd: Soft, nondistended, nontender, no rebound pain, no organomegaly, BS present Ext: No edema bilaterally. 2+DP/PT pulse bilaterally Musculoskeletal: No joint deformities, erythema, or stiffness, ROM full Skin: No rashes.  Neuro: Alert and oriented X3, cranial nerves II-XII grossly intact, muscle strength 5/5 in all extremeties, sensation to light touch intact. Moves all extremities Psych: Patient is not psychotic, no suicidal or hemocidal ideation.  Labs on Admission:  Basic Metabolic Panel:  Recent Labs Lab 12/19/13 2021  NA 139  K 4.1  CL 99  CO2 18*  GLUCOSE 196*  BUN 16  CREATININE 1.12  CALCIUM 9.9   Liver Function Tests:  Recent Labs Lab 12/19/13 2021  AST 25  ALT 31  ALKPHOS 57  BILITOT 0.4  PROT 7.0  ALBUMIN 3.9   No results for input(s): LIPASE, AMYLASE in the last 168 hours. No results for input(s): AMMONIA in the last 168 hours. CBC:  Recent Labs Lab 12/19/13 2021  WBC 9.8  NEUTROABS 5.8  HGB 13.1  HCT 41.6  MCV 90.2  PLT 247   Cardiac Enzymes: No results for input(s): CKTOTAL, CKMB, CKMBINDEX, TROPONINI in the last 168 hours.  BNP (last 3  results) No results for input(s): PROBNP in the last 8760 hours. CBG: No results for input(s): GLUCAP in the last 168 hours.  Radiological Exams on Admission: Dg Chest Port 1 View  12/19/2013   CLINICAL DATA:  Shortness of breath for 6 weeks, hypoxia. History of COPD and nicotine use.  EXAM: PORTABLE CHEST - 1 VIEW  COMPARISON:  Chest radiograph November 19, 2013  FINDINGS: Cardiac silhouette is upper limits of normal size, mediastinal silhouette is nonsuspicious, mildly calcified aortic knob. No pleural effusions or focal consolidations. Similar hazy density in LEFT lung base may reflect pleural thickening or plaque. Large suspected hiatal hernia. No pneumothorax.  Surgical clip in LEFT neck may reflect thyroidectomy. Patient is osteopenic. Mildly widened RIGHT acromioclavicular joint space may reflect shoulder separation or, projection artifact.  IMPRESSION: Borderline cardiomegaly, no acute pulmonary process.   Electronically Signed   By: Elon Alas   On: 12/19/2013 21:09    EKG: Independently reviewed.   Assessment/Plan Principal Problem:   Obstructive chronic bronchitis with exacerbation, Copd Gold C Active Problems:   HLD (hyperlipidemia)   Glaucoma   Essential hypertension   Esophageal stenosis   COPD exacerbation   Diabetes mellitus without complication  COPD exacerbation: Patient's symptoms are most likely caused COPD exacerbation. A potential differential diagnosis is pulmonary embolism, which is unlikely given lower probability of Well's score.  -will admit patient to telemetry bed given tachycardia -Nebulizers: Scheduled DuoNeb plus when necessary albuterol nebulizers -Solu-Medrol 60 mg IV q8h -Mucinex for cough -Doxycycline -sputum culture  -Respiratory virus panel  Hypertension: -Continue home medications: Cozaar, amlodipine clonidine  DM-II: No A1c on record. On metformin and glipizide at home -Switch oral medications to sliding scale insulin -Check  A1c  Hyperlipidemia: No lipid profile on record. Not on medications at home -Check lipid profile  Glaucoma: Continue home eyedrops  GERD: Protonix   DVT ppx: SQ Heparin   Code Status: Full code Family Communication:  Yes, patient's wife at bed side Disposition Plan:  Admit to inpatient   Date of Service 12/19/2013    Ivor Costa [facility] Pager [phone]  If 7PM-7AM, please contact night-coverage [email or web address] Password [email or web address] 12/19/2013, 11:14 PM

## 2013-12-19 NOTE — ED Notes (Signed)
Report attempted 

## 2013-12-19 NOTE — Progress Notes (Signed)
Attempted report 

## 2013-12-20 ENCOUNTER — Telehealth: Payer: Self-pay | Admitting: Critical Care Medicine

## 2013-12-20 ENCOUNTER — Encounter (HOSPITAL_COMMUNITY): Payer: Self-pay | Admitting: General Practice

## 2013-12-20 ENCOUNTER — Encounter: Payer: Self-pay | Admitting: *Deleted

## 2013-12-20 DIAGNOSIS — J441 Chronic obstructive pulmonary disease with (acute) exacerbation: Principal | ICD-10-CM

## 2013-12-20 LAB — HEMOGLOBIN A1C
HEMOGLOBIN A1C: 6.5 % — AB (ref <5.7)
Mean Plasma Glucose: 140 mg/dL — ABNORMAL HIGH (ref <117)

## 2013-12-20 LAB — LIPID PANEL
Cholesterol: 150 mg/dL (ref 0–200)
HDL: 42 mg/dL (ref >39)
LDL CALC: 85 mg/dL (ref 0–99)
TRIGLYCERIDES: 113 mg/dL (ref <150)
Total CHOL/HDL Ratio: 3.6 RATIO
VLDL: 23 mg/dL (ref 0–40)

## 2013-12-20 LAB — GLUCOSE, CAPILLARY: GLUCOSE-CAPILLARY: 248 mg/dL — AB (ref 70–99)

## 2013-12-20 LAB — BASIC METABOLIC PANEL
ANION GAP: 20 — AB (ref 5–15)
BUN: 15 mg/dL (ref 6–23)
CALCIUM: 9.6 mg/dL (ref 8.4–10.5)
CO2: 20 mEq/L (ref 19–32)
Chloride: 95 mEq/L — ABNORMAL LOW (ref 96–112)
Creatinine, Ser: 0.98 mg/dL (ref 0.50–1.35)
GFR, EST AFRICAN AMERICAN: 86 mL/min — AB (ref >90)
GFR, EST NON AFRICAN AMERICAN: 74 mL/min — AB (ref >90)
GLUCOSE: 273 mg/dL — AB (ref 70–99)
Potassium: 4.3 mEq/L (ref 3.7–5.3)
SODIUM: 135 meq/L — AB (ref 137–147)

## 2013-12-20 LAB — CBC
HCT: 38.2 % — ABNORMAL LOW (ref 39.0–52.0)
HEMOGLOBIN: 12.4 g/dL — AB (ref 13.0–17.0)
MCH: 29 pg (ref 26.0–34.0)
MCHC: 32.5 g/dL (ref 30.0–36.0)
MCV: 89.3 fL (ref 78.0–100.0)
Platelets: 249 10*3/uL (ref 150–400)
RBC: 4.28 MIL/uL (ref 4.22–5.81)
RDW: 16.8 % — AB (ref 11.5–15.5)
WBC: 5.6 10*3/uL (ref 4.0–10.5)

## 2013-12-20 MED ORDER — ASPIRIN EC 81 MG PO TBEC
81.0000 mg | DELAYED_RELEASE_TABLET | Freq: Every day | ORAL | Status: DC
Start: 2013-12-20 — End: 2013-12-23
  Administered 2013-12-20 – 2013-12-22 (×3): 81 mg via ORAL
  Administered 2013-12-23: 09:00:00 via ORAL
  Filled 2013-12-20 (×4): qty 1

## 2013-12-20 MED ORDER — DOXYCYCLINE HYCLATE 100 MG PO TABS
100.0000 mg | ORAL_TABLET | Freq: Two times a day (BID) | ORAL | Status: DC
  Administered 2013-12-20 – 2013-12-23 (×8): 100 mg via ORAL
  Filled 2013-12-20 (×10): qty 1

## 2013-12-20 MED ORDER — HYDROCODONE-ACETAMINOPHEN 5-325 MG PO TABS: 1.0000 | ORAL_TABLET | Freq: Two times a day (BID) | ORAL | Status: DC | PRN

## 2013-12-20 MED ORDER — NAPROXEN 250 MG PO TABS
250.0000 mg | ORAL_TABLET | Freq: Two times a day (BID) | ORAL | Status: DC | PRN
  Administered 2013-12-20: 250 mg via ORAL
  Filled 2013-12-20 (×2): qty 1

## 2013-12-20 MED ORDER — BRIMONIDINE TARTRATE 0.2 % OP SOLN
1.0000 [drp] | Freq: Two times a day (BID) | OPHTHALMIC | Status: DC
  Administered 2013-12-20 – 2013-12-23 (×8): 1 [drp] via OPHTHALMIC
  Filled 2013-12-20: qty 5

## 2013-12-20 MED ORDER — DIPHENHYDRAMINE HCL 25 MG PO CAPS
25.0000 mg | ORAL_CAPSULE | Freq: Four times a day (QID) | ORAL | Status: DC
  Administered 2013-12-20 – 2013-12-23 (×14): 25 mg via ORAL
  Filled 2013-12-20 (×19): qty 1

## 2013-12-20 MED ORDER — AMLODIPINE BESYLATE 5 MG PO TABS
5.0000 mg | ORAL_TABLET | Freq: Every day | ORAL | Status: DC
  Administered 2013-12-20 – 2013-12-21 (×2): 5 mg via ORAL
  Filled 2013-12-20 (×3): qty 1

## 2013-12-20 MED ORDER — METHYLPREDNISOLONE SODIUM SUCC 125 MG IJ SOLR
60.0000 mg | Freq: Three times a day (TID) | INTRAMUSCULAR | Status: DC
  Administered 2013-12-20 (×2): 60 mg via INTRAVENOUS
  Filled 2013-12-20 (×2): qty 0.96
  Filled 2013-12-20 (×2): qty 2
  Filled 2013-12-20: qty 0.96

## 2013-12-20 MED ORDER — CLONIDINE HCL 0.2 MG PO TABS
0.2000 mg | ORAL_TABLET | Freq: Two times a day (BID) | ORAL | Status: DC | PRN
  Administered 2013-12-22 – 2013-12-23 (×2): 0.2 mg via ORAL
  Filled 2013-12-20 (×3): qty 1

## 2013-12-20 MED ORDER — INSULIN ASPART 100 UNIT/ML ~~LOC~~ SOLN
0.0000 [IU] | Freq: Three times a day (TID) | SUBCUTANEOUS | Status: DC
  Administered 2013-12-20 – 2013-12-21 (×4): 3 [IU] via SUBCUTANEOUS
  Administered 2013-12-22: 5 [IU] via SUBCUTANEOUS
  Administered 2013-12-22: 3 [IU] via SUBCUTANEOUS
  Administered 2013-12-22: 2 [IU] via SUBCUTANEOUS
  Administered 2013-12-23: 3 [IU] via SUBCUTANEOUS
  Administered 2013-12-23: 5 [IU] via SUBCUTANEOUS

## 2013-12-20 MED ORDER — PREDNISONE 50 MG PO TABS
60.0000 mg | ORAL_TABLET | Freq: Every day | ORAL | Status: DC
  Administered 2013-12-20 – 2013-12-21 (×2): 60 mg via ORAL
  Filled 2013-12-20 (×3): qty 1

## 2013-12-20 MED ORDER — HEPARIN SODIUM (PORCINE) 5000 UNIT/ML IJ SOLN
5000.0000 [IU] | Freq: Three times a day (TID) | INTRAMUSCULAR | Status: DC
  Administered 2013-12-20 – 2013-12-23 (×9): 5000 [IU] via SUBCUTANEOUS
  Filled 2013-12-20 (×13): qty 1

## 2013-12-20 MED ORDER — LORATADINE 10 MG PO TABS
10.0000 mg | ORAL_TABLET | Freq: Every day | ORAL | Status: DC
  Administered 2013-12-20 – 2013-12-23 (×4): 10 mg via ORAL
  Filled 2013-12-20 (×4): qty 1

## 2013-12-20 MED ORDER — IPRATROPIUM-ALBUTEROL 0.5-2.5 (3) MG/3ML IN SOLN
3.0000 mL | Freq: Three times a day (TID) | RESPIRATORY_TRACT | Status: DC
  Administered 2013-12-21: 3 mL via RESPIRATORY_TRACT
  Filled 2013-12-20: qty 3

## 2013-12-20 MED ORDER — SAW PALMETTO (SERENOA REPENS) 500 MG PO CAPS: 500.0000 mg | ORAL_CAPSULE | Freq: Every day | ORAL | Status: DC

## 2013-12-20 MED ORDER — GUAIFENESIN ER 600 MG PO TB12
600.0000 mg | ORAL_TABLET | Freq: Two times a day (BID) | ORAL | Status: DC | PRN
  Filled 2013-12-20: qty 1

## 2013-12-20 MED ORDER — VITAMIN D3 25 MCG (1000 UNIT) PO TABS
1000.0000 [IU] | ORAL_TABLET | Freq: Every day | ORAL | Status: DC
  Administered 2013-12-20 – 2013-12-23 (×4): 1000 [IU] via ORAL
  Filled 2013-12-20 (×4): qty 1

## 2013-12-20 MED ORDER — IPRATROPIUM-ALBUTEROL 0.5-2.5 (3) MG/3ML IN SOLN
3.0000 mL | RESPIRATORY_TRACT | Status: DC
  Administered 2013-12-20 (×6): 3 mL via RESPIRATORY_TRACT
  Filled 2013-12-20 (×6): qty 3

## 2013-12-20 MED ORDER — LOSARTAN POTASSIUM 50 MG PO TABS
100.0000 mg | ORAL_TABLET | Freq: Every day | ORAL | Status: DC
Start: 2013-12-20 — End: 2013-12-23
  Administered 2013-12-20 – 2013-12-23 (×4): 100 mg via ORAL
  Filled 2013-12-20 (×4): qty 2

## 2013-12-20 MED ORDER — PANTOPRAZOLE SODIUM 40 MG PO TBEC
40.0000 mg | DELAYED_RELEASE_TABLET | Freq: Every day | ORAL | Status: DC
  Administered 2013-12-20 – 2013-12-23 (×4): 40 mg via ORAL
  Filled 2013-12-20 (×3): qty 1

## 2013-12-20 MED ORDER — FLUTICASONE PROPIONATE 50 MCG/ACT NA SUSP
2.0000 | Freq: Every day | NASAL | Status: DC
  Administered 2013-12-20 – 2013-12-23 (×4): 2 via NASAL
  Filled 2013-12-20: qty 16

## 2013-12-20 MED ORDER — DIAZEPAM 5 MG PO TABS: 2.5000 mg | ORAL_TABLET | Freq: Every day | ORAL | Status: DC | PRN

## 2013-12-20 MED ORDER — ALBUTEROL SULFATE (2.5 MG/3ML) 0.083% IN NEBU: 2.5000 mg | INHALATION_SOLUTION | RESPIRATORY_TRACT | Status: DC | PRN

## 2013-12-20 MED ORDER — SODIUM CHLORIDE 0.9 % IJ SOLN
3.0000 mL | Freq: Two times a day (BID) | INTRAMUSCULAR | Status: DC
  Administered 2013-12-20 – 2013-12-22 (×7): 3 mL via INTRAVENOUS

## 2013-12-20 MED ORDER — ZINC SULFATE 220 (50 ZN) MG PO CAPS
220.0000 mg | ORAL_CAPSULE | Freq: Every day | ORAL | Status: DC
  Administered 2013-12-20 – 2013-12-23 (×4): 220 mg via ORAL
  Filled 2013-12-20 (×4): qty 1

## 2013-12-20 NOTE — Plan of Care (Signed)
Problem: Phase I Progression Outcomes Goal: Dyspnea controlled at rest Outcome: Completed/Met Date Met:  12/20/13     

## 2013-12-20 NOTE — Consult Note (Signed)
Name: Chad [name] MRN: [id number] DOB: 1929/07/10    ADMISSION DATE:  12/19/2013 CONSULTATION DATE:  12/20/2013  REFERRING MD :  TRH  CHIEF COMPLAINT:   dyspnea  BRIEF PATIENT DESCRIPTION:  80 y.o. M Copd Gold D  With frequent exacerbations. Adm 11/26 with another flare.  Note pt ill all week and did not call our office.   SIGNIFICANT EVENTS     STUDIES:      HISTORY OF PRESENT ILLNESS:   80 y.o.M with copd gold D.  Freq exac.  Pt with progressive dyspnea and cough , notes more mucus.  Recent abx and steroids in past month. Pt ill all week and never called office.   PAST MEDICAL HISTORY :   has a past medical history of Fatty liver disease, nonalcoholic; Coronary artery stenosis; Diverticulosis of colon; Diaphragmatic hernia without mention of obstruction or gangrene; Dyspnea; COPD (chronic obstructive pulmonary disease); Personal history of other malignant neoplasm of skin; Barrett's esophagus; Arthritis; TIA (transient ischemic attack); Diabetes mellitus; Hyperlipidemia; Acid reflux disease; Glaucoma; HTN (hypertension); RENAL CALCULUS, HX OF (08/28/2008); and CAROTID ARTERY STENOSIS, BILATERAL (08/27/2008).  has past surgical history that includes Esophagectomy (2007); Eye surgery; Ventral hernia repair (2008); ortho knee both knees; and Carotid angioplasty. Prior to Admission medications   Medication Sig Start Date End Date Taking? Authorizing Provider  albuterol (PROAIR HFA) 108 (90 BASE) MCG/ACT inhaler Inhale 1-2 puffs into the lungs every 6 (six) hours as needed. For breathing   Yes Historical Provider, MD  albuterol (PROVENTIL) (2.5 MG/3ML) 0.083% nebulizer solution 1 vial in nebulizer every 4-6 hours as needed for wheezing/shortness of breath. 11/25/13  Yes Tammy S Parrett, NP  amLODipine (NORVASC) 5 MG tablet Take 5 mg by mouth daily.     Yes Historical Provider, MD  aspirin 81 MG tablet Take 81 mg by mouth daily.     Yes Historical Provider, MD  brimonidine  (ALPHAGAN P) 0.1 % SOLN Place 1 drop into the left eye 2 (two) times daily.     Yes Historical Provider, MD  chlorpheniramine (CHLOR-TRIMETON) 4 MG tablet Take 4 mg by mouth 2 (two) times daily as needed for allergies.   Yes Historical Provider, MD  Cholecalciferol (VITAMIN D) 1000 UNITS capsule Take 1,000 Units by mouth daily.     Yes Historical Provider, MD  cloNIDine (CATAPRES) 0.2 MG tablet 1 tab by mouth as needed when BP is over 140    Yes Historical Provider, MD  diazepam (VALIUM) 5 MG tablet Take 2.5 mg by mouth daily as needed. For anxiety   Yes Historical Provider, MD  fluticasone (FLONASE) 50 MCG/ACT nasal spray Place 2 sprays into both nostrils daily. 12/24/12  Yes Elsie Stain, MD  formoterol (FORADIL AEROLIZER) 12 MCG capsule for inhaler Place 12 mcg into inhaler and inhale 2 (two) times daily.   Yes Historical Provider, MD  glipiZIDE (GLUCOTROL XL) 2.5 MG 24 hr tablet Take 2.5 mg by mouth daily.   Yes Historical Provider, MD  guaiFENesin (MUCINEX) 600 MG 12 hr tablet Take 600 mg by mouth 2 (two) times daily as needed.   Yes Historical Provider, MD  HYDROcodone-acetaminophen (NORCO/VICODIN) 5-325 MG per tablet Take 1 tablet by mouth 2 (two) times daily as needed (cough).   Yes Historical Provider, MD  loratadine (CLARITIN) 10 MG tablet Take 10 mg by mouth daily.   Yes Historical Provider, MD  losartan (COZAAR) 100 MG tablet Take 100 mg by mouth daily.     Yes Historical Provider,  MD  metFORMIN (GLUCOPHAGE) 500 MG tablet Take 1,000 mg by mouth 2 (two) times daily with a meal.     Yes Historical Provider, MD  naproxen sodium (ANAPROX) 220 MG tablet Take 220 mg by mouth as needed.   Yes Historical Provider, MD  omeprazole (PRILOSEC) 20 MG capsule Take 40 mg by mouth 2 (two) times daily.    Yes Historical Provider, MD  Potassium 99 MG TABS Take 1 tablet by mouth daily.     Yes Historical Provider, MD  saw palmetto 500 MG capsule Take 500 mg by mouth daily.     Yes Historical Provider, MD   tiotropium (SPIRIVA) 18 MCG inhalation capsule Place 18 mcg into inhaler and inhale daily.     Yes Historical Provider, MD  valACYclovir (VALTREX) 500 MG tablet Take 1,000 mg by mouth every 8 (eight) hours as needed. For out breaks   Yes Historical Provider, MD  Zinc 100 MG TABS Take 1 tablet by mouth daily.     Yes Historical Provider, MD   Allergies  Allergen Reactions  . Moxifloxacin Anaphylaxis    Avelox REACTION: anaphylaxis  . Penicillins     REACTION: hives  . Sulfonamide Derivatives     REACTION: hives  . Timolol Maleate     Causes severe chest congestion    FAMILY HISTORY:  family history includes Colon cancer in his maternal grandfather and mother; Esophageal cancer in his father; Heart attack in his brother, brother, and mother; Lung cancer in his mother; Stomach cancer in his father. SOCIAL HISTORY:  reports that he quit smoking about 35 years ago. His smoking use included Cigarettes. He has a 40 pack-year smoking history. He has quit using smokeless tobacco. He reports that he does not drink alcohol or use illicit drugs.  REVIEW OF SYSTEMS:   Positive in BOLD Constitutional: Negative for fever, chills, weight loss, malaise/fatigue and diaphoresis.  HENT: Negative for hearing loss, ear pain, nosebleeds, congestion, sore throat, neck pain, tinnitus and ear discharge.   Eyes: Negative for blurred vision, double vision, photophobia, pain, discharge and redness.  Respiratory: cough, hemoptysis, sputum production, shortness of breath, wheezing and stridor.   Cardiovascular: Negative for chest pain, palpitations, orthopnea, claudication, leg swelling and PND.  Gastrointestinal: Negative for heartburn, nausea, vomiting, abdominal pain, diarrhea, constipation, blood in stool and melena.  Genitourinary: Negative for dysuria, urgency, frequency, hematuria and flank pain.  Musculoskeletal: Negative for myalgias, back pain, joint pain and falls.  Skin: Negative for itching and rash.    Neurological: Negative for dizziness, tingling, tremors, sensory change, speech change, focal weakness, seizures, loss of consciousness, weakness and headaches.  Endo/Heme/Allergies: Negative for environmental allergies and polydipsia. Does not bruise/bleed easily.  SUBJECTIVE:  Ongoing dyspnea but better compared to 11/26  VITAL SIGNS: Temp:  [97.4 F (36.3 C)-97.6 F (36.4 C)] 97.5 F (36.4 C) (11/27 0548) Pulse Rate:  [103-121] 118 (11/27 0548) Resp:  [18-33] 24 (11/27 0548) BP: (113-182)/(47-113) 130/80 mmHg (11/27 0940) SpO2:  [82 %-100 %] 98 % (11/27 0803) Weight:  [76.613 kg (168 lb 14.4 oz)-83.008 kg (183 lb)] 76.613 kg (168 lb 14.4 oz) (11/27 0500)  PHYSICAL EXAMINATION: General:  Elderly Male in nad Neuro:  Awake and alert HEENT:  No JVD  Cardiovascular:  RRR nl s1 s2  Lungs:  Distant bs Abdomen:  Soft nt bsa Musculoskeletal:  from Skin:  clear   Recent Labs Lab 12/19/13 2021 12/20/13 0404  NA 139 135*  K 4.1 4.3  CL 99 95*  CO2 18* 20  BUN 16 15  CREATININE 1.12 0.98  GLUCOSE 196* 273*    Recent Labs Lab 12/19/13 2021 12/20/13 0404  HGB 13.1 12.4*  HCT 41.6 38.2*  WBC 9.8 5.6  PLT 247 249   Dg Chest Port 1 View  12/19/2013   CLINICAL DATA:  Shortness of breath for 6 weeks, hypoxia. History of COPD and nicotine use.  EXAM: PORTABLE CHEST - 1 VIEW  COMPARISON:  Chest radiograph November 19, 2013  FINDINGS: Cardiac silhouette is upper limits of normal size, mediastinal silhouette is nonsuspicious, mildly calcified aortic knob. No pleural effusions or focal consolidations. Similar hazy density in LEFT lung base may reflect pleural thickening or plaque. Large suspected hiatal hernia. No pneumothorax.  Surgical clip in LEFT neck may reflect thyroidectomy. Patient is osteopenic. Mildly widened RIGHT acromioclavicular joint space may reflect shoulder separation or, projection artifact.  IMPRESSION: Borderline cardiomegaly, no acute pulmonary process.    Electronically Signed   By: Elon Alas   On: 12/19/2013 21:09    ASSESSMENT / PLAN:  #1 copd Gold D with frequent exacerbations.  Pt did not call when he was declining.    Plan   Cont steroids, IV doxy, Neb meds   I enrolled pt in Gold Copd program, see order sets   Pt instructed to call next time earlier   Doubt will need oxygen   Upon d/c send out on pred/doxy PO with slow steroid taper   Please give pt Rx for his own nebulizer and also refill albuterol    In nebulizer   Get pt appt to f/u with [facility] Tammy parrett short term 5    days or less after d/c prob can d/c in AM [facility]  [phone]  Cell  [phone]  If no response or cell goes to voicemail, call beeper [phone]  Pulmonary and [place] Pager: [phone]  12/20/2013, 11:04 AM

## 2013-12-20 NOTE — Progress Notes (Signed)
PHARMACIST - PHYSICIAN ORDER COMMUNICATION  CONCERNING: P&T Medication Policy on Herbal Medications  DESCRIPTION:  This patient's order for:  Saw Palmetto  has been noted.  This product(s) is classified as an "herbal" or natural product. Due to a lack of definitive safety studies or FDA approval, nonstandard manufacturing practices, plus the potential risk of unknown drug-drug interactions while on inpatient medications, the Pharmacy and Therapeutics Committee does not permit the use of "herbal" or natural products of this type within [place].   ACTION TAKEN: The pharmacy department is unable to verify this order at this time and your patient has been informed of this safety policy. Please reevaluate patient's clinical condition at discharge and address if the herbal or natural product(s) should be resumed at that time.   

## 2013-12-20 NOTE — Progress Notes (Signed)
INITIAL NUTRITION ASSESSMENT  DOCUMENTATION CODES Per approved criteria  -Not Applicable   INTERVENTION: -Continue with Carb Modified diet  NUTRITION DIAGNOSIS: Inadequate oral intake related to respiratory status as evidenced by poor po intake PTA.   Goal: Pt will meet >90% of estimated nutritional needs  Monitor:  PO intake, labs, weight changes, I/O's  Reason for Assessment: Consult to assess nutrition requirements and status  80 y.o. male  Admitting Dx: Obstructive chronic bronchitis with exacerbation  80 y.o. M Copd Gold D With frequent exacerbations. Adm 11/26 with another flare. Note pt ill all week and did not call our office.  ASSESSMENT: Pt admitted with COPD. Pt was on the phone with family member at time of visit and pt wife requested pt not be disturbed. No physical signs of malnutriiton noted on pt temple, orbital, arms, clavicle or hand areas.  Hx obtained from pt wife at bedside. She reveals that pt had progressive decline in health in the past 6 weeks PTA, due to respiratory status. She reports pt's appetite had been poor over the past week, but has improved since admission. Home diet is regular. She reports pt ate 100% of his breakfast this morning. She declines offer of nutritional supplements and feels like pt is able to meet his needs now that appetite has improved.  Documented wt hx reveals a 7.6% wt loss over the past week. UBW of 182-186#. Question accuracy of most recent wt, as pt has no physical signs of malnutrition. Labs reviewed. Na: 135, Cl: 95, Glucose: 273.  Height: Ht Readings from Last 1 Encounters:  12/20/13 5\' 9"  (1.753 m)    Weight: Wt Readings from Last 1 Encounters:  12/20/13 168 lb 14.4 oz (76.613 kg)    Ideal Body Weight: 160#  % Ideal Body Weight: 105%  Wt Readings from Last 10 Encounters:  12/20/13 168 lb 14.4 oz (76.613 kg)  12/13/13 182 lb 3.2 oz (82.645 kg)  11/25/13 189 lb (85.73 kg)  11/19/13 184 lb (83.462 kg)   10/11/13 178 lb 6.4 oz (80.922 kg)  07/03/13 186 lb 14.4 oz (84.777 kg)  05/01/13 186 lb 3.2 oz (84.46 kg)  03/18/13 185 lb 6.4 oz (84.097 kg)  01/28/13 182 lb 12.8 oz (82.918 kg)  01/10/13 185 lb 6.4 oz (84.097 kg)    Usual Body Weight: 185#  % Usual Body Weight: 91%  BMI:  Body mass index is 24.93 kg/(m^2). Normal weight range  Estimated Nutritional Needs: Kcal: 1900-2100 Protein: 92-102 grams Fluid: 1.9-2.1 L  Skin: Intact  Diet Order: Diet Carb Modified  EDUCATION NEEDS: -No education needs identified at this time  No intake or output data in the 24 hours ending 12/20/13 1135  Last BM: PTA  Labs:   Recent Labs Lab 12/19/13 2021 12/20/13 0404  NA 139 135*  K 4.1 4.3  CL 99 95*  CO2 18* 20  BUN 16 15  CREATININE 1.12 0.98  CALCIUM 9.9 9.6  GLUCOSE 196* 273*    CBG (last 3)   Recent Labs  12/20/13 0749  GLUCAP 248*    Scheduled Meds: . amLODipine  5 mg Oral Daily  . aspirin EC  81 mg Oral Daily  . brimonidine  1 drop Both Eyes BID  . cholecalciferol  1,000 Units Oral Daily  . diphenhydrAMINE  25 mg Oral 4 times per day  . doxycycline  100 mg Oral Q12H  . fluticasone  2 spray Each Nare Daily  . heparin  5,000 Units Subcutaneous 3 times per  day  . insulin aspart  0-9 Units Subcutaneous TID WC  . ipratropium-albuterol  3 mL Nebulization Q4H  . loratadine  10 mg Oral Daily  . losartan  100 mg Oral Daily  . methylPREDNISolone (SOLU-MEDROL) injection  60 mg Intravenous 3 times per day  . pantoprazole  40 mg Oral Daily  . sodium chloride  3 mL Intravenous Q12H  . zinc sulfate  220 mg Oral Daily    Continuous Infusions:   Past Medical History  Diagnosis Date  . Fatty liver disease, nonalcoholic   . Coronary artery stenosis   . Diverticulosis of colon   . Diaphragmatic hernia without mention of obstruction or gangrene   . Dyspnea   . COPD (chronic obstructive pulmonary disease)   . Personal history of other malignant neoplasm of skin   .  Barrett's esophagus   . Arthritis   . TIA (transient ischemic attack)   . Diabetes mellitus   . Hyperlipidemia   . Acid reflux disease   . Glaucoma   . HTN (hypertension)   . RENAL CALCULUS, HX OF 08/28/2008  . CAROTID ARTERY STENOSIS, BILATERAL 08/27/2008    Past Surgical History  Procedure Laterality Date  . Esophagectomy  2007    with stomach pull through  . Eye surgery    . Ventral hernia repair  2008  . Ortho knee both knees    . Carotid artery angioplasty     Graceson Nichelson A. Jimmye Norman, RD, LDN Pager: [phone] After hours Pager: [phone]

## 2013-12-20 NOTE — Progress Notes (Signed)
Chad [name] ALP:[id number] DOB: 06-04-1929 DOA: 12/19/2013 PCP: Myriam Jacobson, MD  Brief narrative: 80 y/o ? h/o COPD + Emphysema, Esophagectomy 2007-recently Rx for COPD in OP c PRednisone taper and recently seen [place] office 11/20.  Had further decline and came to [facility] for work-up.  Past medical history-As per Problem list Chart reviewed as below-   Consultants:  Pulm  Procedures:  none  Antibiotics:  Doxycycline 11/26   Subjective   Alert pleasant no distress tol diet No cp No wheeze  Some sputum Confabulating to some extent    Objective    Interim History:   Telemetry:    Objective: Filed Vitals:   12/20/13 0500 12/20/13 0548 12/20/13 0803 12/20/13 0940  BP:  139/83  130/80  Pulse:  118    Temp:  97.5 F (36.4 C)    TempSrc:  Oral    Resp:  24    Height:      Weight: 76.613 kg (168 lb 14.4 oz)     SpO2:  97% 98%    No intake or output data in the 24 hours ending 12/20/13 1027  Exam:  General: eomi, ncat Cardiovascular: s1 s 2 no m/r/g Respiratory: clear, no added sound Abdomen: soft, NOted hernia epigastric-lage  ~ 15 cm Skin no LE edema Neuro intact  Data Reviewed: Basic Metabolic Panel:  Recent Labs Lab 12/19/13 2021 12/20/13 0404  NA 139 135*  K 4.1 4.3  CL 99 95*  CO2 18* 20  GLUCOSE 196* 273*  BUN 16 15  CREATININE 1.12 0.98  CALCIUM 9.9 9.6   Liver Function Tests:  Recent Labs Lab 12/19/13 2021  AST 25  ALT 31  ALKPHOS 57  BILITOT 0.4  PROT 7.0  ALBUMIN 3.9   No results for input(s): LIPASE, AMYLASE in the last 168 hours. No results for input(s): AMMONIA in the last 168 hours. CBC:  Recent Labs Lab 12/19/13 2021 12/20/13 0404  WBC 9.8 5.6  NEUTROABS 5.8  --   HGB 13.1 12.4*  HCT 41.6 38.2*  MCV 90.2 89.3  PLT 247 249   Cardiac Enzymes: No results for input(s): CKTOTAL, CKMB, CKMBINDEX, TROPONINI in the last 168 hours. BNP: Invalid input(s): POCBNP CBG:  Recent Labs Lab  12/20/13 0749  GLUCAP 248*    No results found for this or any previous visit (from the past 240 hour(s)).   Studies:              All Imaging reviewed and is as per above notation   Scheduled Meds: . amLODipine  5 mg Oral Daily  . aspirin EC  81 mg Oral Daily  . brimonidine  1 drop Both Eyes BID  . cholecalciferol  1,000 Units Oral Daily  . diphenhydrAMINE  25 mg Oral 4 times per day  . doxycycline  100 mg Oral Q12H  . fluticasone  2 spray Each Nare Daily  . heparin  5,000 Units Subcutaneous 3 times per day  . insulin aspart  0-9 Units Subcutaneous TID WC  . ipratropium-albuterol  3 mL Nebulization Q4H  . loratadine  10 mg Oral Daily  . losartan  100 mg Oral Daily  . methylPREDNISolone (SOLU-MEDROL) injection  60 mg Intravenous 3 times per day  . pantoprazole  40 mg Oral Daily  . sodium chloride  3 mL Intravenous Q12H  . zinc sulfate  220 mg Oral Daily   Continuous Infusions:    Assessment/Plan: 1. Acute exacerbation Gold Stage COPD "D"-taper ALbuterol 2.5  q 4 prn, Flonase 2 spray.  Cont clartitin.  Change solu-medrol to po prednisone 60 daily.  Doxycycline total 5 days needed. 2. Htn continue chronic meds amlodipine 5, clonicdine 0.2 q 12 prn, Losartan 100 daily 3. Gerd-continue Protonix 40 daily 4. Uncotnrolled DM ty 2-Sugars deranged 2/2 to steroids-range 196->248  Code Status: Full Family Communication:  Discussed with family at the bedside Disposition Plan: inpatient   Verneita Griffes, MD  [facility] Pager [phone] 12/20/2013, 10:27 AM    LOS: 1 day

## 2013-12-20 NOTE — Care Management Note (Signed)
CARE MANAGEMENT NOTE 12/20/2013  Patient:  Chad [name], Chad [name]   Account Number:  [id number]  Date Initiated:  12/20/2013  Documentation initiated by:  Marylyn Ishihara  Subjective/Objective Assessment:   Patient admitted from home with worsening SOB.     Action/Plan:   COPD Protocol   Anticipated DC Date:     Anticipated DC Plan:    In-house referral  NA      DC Planning Services  CM consult      Choice offered to / List presented to:  C-3 Spouse        HH arranged  HH-10 DISEASE MANAGEMENT      Status of service:  In process, will continue to follow Medicare Important Message given?   (If response is "NO", the following Medicare IM given date fields will be blank) Date Medicare IM given:   Medicare IM given by:   Date Additional Medicare IM given:   Additional Medicare IM given by:    Discharge Disposition:    Per UR Regulation:    If discussed at Long Length of Stay Meetings, dates discussed:    Comments:  12/20/2013  Hecker, RN, BSN, CCM CM spoke with wife, wants to discuss later.   Patient already has a PCP and is been followed by the Pulmonary Clinic. Discharge needs will continue to be assessed.

## 2013-12-20 NOTE — Plan of Care (Signed)
Problem: Consults Goal: COPD Patient Education (See Patient Education Module for education specifics.)  Outcome: Completed/Met Date Met:  12/20/13

## 2013-12-20 NOTE — Telephone Encounter (Signed)
Called spoke with spouse. She reports pt was admitted to hospital yesterday. FYI for Dr. Joya Gaskins.

## 2013-12-20 NOTE — Evaluation (Signed)
Physical Therapy Evaluation Patient Details Name: Chad [name] MRN: [id number] DOB: September 07, 1929 Today's Date: 12/20/2013   History of Present Illness   Chad [name] is a 80 y.o. male with past medical history of COPD, hypertension, hyperlipidemia, diabetes mellitus, esophageal stenosis, who presents with worsening shortness of breath and wheezing.  Clinical Impression  Pt is generally at a independent level in a home-like environment from a mobility standpoint.  However, ambulation did cause some distress with increased dyspnea and EHR in the 110's. Thankfully pt's SATS were able to maintain in the mid 90% range.  Pt wishes to forego any HHPT so will sign off at this time with expectation of pt's d/c over w/e.    Follow Up Recommendations Other (comment) (pt chose not to accept HHPT)    Equipment Recommendations  None recommended by PT    Recommendations for Other Services       Precautions / Restrictions Precautions Precautions: None      Mobility  Bed Mobility               General bed mobility comments: not tested  Transfers Overall transfer level: Modified independent Equipment used: None             General transfer comment: safe transfer technque  Ambulation/Gait Ambulation/Gait assistance: Supervision Ambulation Distance (Feet): 180 Feet Assistive device: None Gait Pattern/deviations: Step-through pattern Gait velocity: moderate speed   General Gait Details: generally steady, but with incr WOB during ambulation expecially since he was wearing a face mask.  Stairs            Wheelchair Mobility    Modified Rankin (Stroke Patients Only)       Balance Overall balance assessment: No apparent balance deficits (not formally assessed)                                           Pertinent Vitals/Pain Pain Assessment: No/denies pain    Home Living Family/patient expects to be discharged to:: Private residence Living  Arrangements: Spouse/significant other Available Help at Discharge: Family;Available 24 hours/day Type of Home: House Home Access: Stairs to enter Entrance Stairs-Rails: Chemical engineer of Steps: several Home Layout: One level Home Equipment: Grab bars - toilet;Grab bars - tub/shower;Walker - 2 wheels      Prior Function Level of Independence: Independent               Hand Dominance        Extremity/Trunk Assessment   Upper Extremity Assessment: Overall WFL for tasks assessed           Lower Extremity Assessment: Overall WFL for tasks assessed      Cervical / Trunk Assessment: Normal  Communication   Communication: No difficulties  Cognition Arousal/Alertness: Awake/alert Behavior During Therapy: WFL for tasks assessed/performed Overall Cognitive Status: Within Functional Limits for tasks assessed                      General Comments General comments (skin integrity, edema, etc.): With ambulation pt's sats maintained in mid 90's, but with EHR in the 110's and with moderate dyspnea.    Exercises        Assessment/Plan    PT Assessment All further PT needs can be met in the next venue of care (pt chose not to get follow up PT at home)  PT Diagnosis  PT Problem List Decreased activity tolerance  PT Treatment Interventions     PT Goals (Current goals can be found in the Care Plan section) Acute Rehab PT Goals PT Goal Formulation: All assessment and education complete, DC therapy    Frequency     Barriers to discharge        Co-evaluation               End of Session Equipment Utilized During Treatment: Oxygen (used O2 to get pt calmed down) Activity Tolerance: Patient tolerated treatment well Patient left: in chair;with call bell/phone within reach Nurse Communication: Mobility status         Time: 6047-9987 PT Time Calculation (min) (ACUTE ONLY): 21 min   Charges:   PT Evaluation $Initial PT  Evaluation Tier I: 1 Procedure PT Treatments $Gait Training: 8-22 mins   PT G Codes:          Wilber Fini, Tessie Fass 12/20/2013, 3:17 PM 12/20/2013  Donnella Sham, PT [phone]  (pager)

## 2013-12-20 NOTE — Plan of Care (Signed)
Problem: Phase I Progression Outcomes Goal: O2 sats > or equal 90% or at baseline Outcome: Completed/Met Date Met:  12/20/13

## 2013-12-20 NOTE — Telephone Encounter (Signed)
noted 

## 2013-12-21 LAB — GLUCOSE, CAPILLARY
Glucose-Capillary: 205 mg/dL — ABNORMAL HIGH (ref 70–99)
Glucose-Capillary: 212 mg/dL — ABNORMAL HIGH (ref 70–99)
Glucose-Capillary: 240 mg/dL — ABNORMAL HIGH (ref 70–99)
Glucose-Capillary: 199 mg/dL — ABNORMAL HIGH (ref 70–99)

## 2013-12-21 LAB — EXPECTORATED SPUTUM ASSESSMENT W GRAM STAIN, RFLX TO RESP C

## 2013-12-21 LAB — EXPECTORATED SPUTUM ASSESSMENT W REFEX TO RESP CULTURE

## 2013-12-21 MED ORDER — ALBUTEROL SULFATE (2.5 MG/3ML) 0.083% IN NEBU
2.5000 mg | INHALATION_SOLUTION | RESPIRATORY_TRACT | Status: DC
  Administered 2013-12-21 – 2013-12-23 (×12): 2.5 mg via RESPIRATORY_TRACT
  Filled 2013-12-21 (×13): qty 3

## 2013-12-21 MED ORDER — METHYLPREDNISOLONE SODIUM SUCC 125 MG IJ SOLR
60.0000 mg | Freq: Two times a day (BID) | INTRAMUSCULAR | Status: DC
  Administered 2013-12-21 – 2013-12-22 (×3): 60 mg via INTRAVENOUS
  Filled 2013-12-21: qty 2
  Filled 2013-12-21 (×2): qty 0.96
  Filled 2013-12-21: qty 2

## 2013-12-21 NOTE — Plan of Care (Signed)
Problem: Phase I Progression Outcomes Goal: Tolerating diet Outcome: Completed/Met Date Met:  12/21/13

## 2013-12-21 NOTE — Plan of Care (Signed)
Problem: Phase I Progression Outcomes Goal: Pain controlled Outcome: Completed/Met Date Met:  12/21/13

## 2013-12-21 NOTE — Progress Notes (Signed)
JAYMZ TRAYWICK WNU:[id number] DOB: 12-Jan-1930 DOA: 12/19/2013 PCP: Myriam Jacobson, MD  Brief narrative: 80 y/o ? h/o COPD + Emphysema, Esophagectomy 2007-recently Rx for COPD in OP c PRednisone taper and recently seen [place] office 11/20.  Had further decline and came to Ed for work-up.  Pulmonology saw the patient and made recommendations.  Past medical history-As per Problem list Chart reviewed as below-   Consultants:  Pulm  Procedures:  none  Antibiotics:  Doxycycline 11/26   Subjective   Doing fair Some confusion overnight Coughing up some sputum this am Still some wheeze   Objective    Interim History:   Telemetry:    Objective: Filed Vitals:   12/20/13 2312 12/21/13 0551 12/21/13 0815 12/21/13 0840  BP: 152/79 161/74 163/112   Pulse: 101 92 95   Temp: 98.2 F (36.8 C) 98 F (36.7 C)    TempSrc: Oral Oral    Resp: 17 16 16    Height:      Weight:      SpO2: 95% 97% 99% 98%    Intake/Output Summary (Last 24 hours) at 12/21/13 0905 Last data filed at 12/21/13 0800  Gross per 24 hour  Intake    150 ml  Output    400 ml  Net   -250 ml    Exam:  General: eomi, ncat Cardiovascular: s1 s 2 no m/r/g Respiratory: wheeze noted bilaterally, no rales, no rhonchi Abdomen: soft, Noted hernia epigastric-lage  ~ 15 cm Skin no LE edema Neuro intact  Data Reviewed: Basic Metabolic Panel:  Recent Labs Lab 12/19/13 2021 12/20/13 0404  NA 139 135*  K 4.1 4.3  CL 99 95*  CO2 18* 20  GLUCOSE 196* 273*  BUN 16 15  CREATININE 1.12 0.98  CALCIUM 9.9 9.6   Liver Function Tests:  Recent Labs Lab 12/19/13 2021  AST 25  ALT 31  ALKPHOS 57  BILITOT 0.4  PROT 7.0  ALBUMIN 3.9   No results for input(s): LIPASE, AMYLASE in the last 168 hours. No results for input(s): AMMONIA in the last 168 hours. CBC:  Recent Labs Lab 12/19/13 2021 12/20/13 0404  WBC 9.8 5.6  NEUTROABS 5.8  --   HGB 13.1 12.4*  HCT 41.6 38.2*  MCV 90.2 89.3    PLT 247 249   Cardiac Enzymes: No results for input(s): CKTOTAL, CKMB, CKMBINDEX, TROPONINI in the last 168 hours. BNP: Invalid input(s): POCBNP CBG:  Recent Labs Lab 12/20/13 0749 12/21/13 0837  GLUCAP 248* 205*    No results found for this or any previous visit (from the past 240 hour(s)).   Studies:              All Imaging reviewed and is as per above notation   Scheduled Meds: . amLODipine  5 mg Oral Daily  . aspirin EC  81 mg Oral Daily  . brimonidine  1 drop Both Eyes BID  . cholecalciferol  1,000 Units Oral Daily  . diphenhydrAMINE  25 mg Oral 4 times per day  . doxycycline  100 mg Oral Q12H  . fluticasone  2 spray Each Nare Daily  . heparin  5,000 Units Subcutaneous 3 times per day  . insulin aspart  0-9 Units Subcutaneous TID WC  . ipratropium-albuterol  3 mL Nebulization TID  . loratadine  10 mg Oral Daily  . losartan  100 mg Oral Daily  . methylPREDNISolone (SOLU-MEDROL) injection  60 mg Intravenous Q12H  . pantoprazole  40 mg Oral  Daily  . sodium chloride  3 mL Intravenous Q12H  . zinc sulfate  220 mg Oral Daily   Continuous Infusions:    Assessment/Plan:  1. Acute exacerbation Gold Stage COPD "D"-taper ALbuterol 2.5 q 4 scheduled, Flonase 2 spray.  Cont clartitin.  Due to wheeze will continue Solu-medrol today 60 mg bid with hope to transition to PO prednisone in am.  Doxycycline total 5 days needed. 2. Htn continue chronic meds amlodipine 5, clonicdine 0.2 q 12 prn, Losartan 100 daily-blood pressure not at goal.  Might need to increase meds in the next 24 hours 3. Gerd-continue Protonix 40 daily 4. Uncotnrolled DM ty 2-Sugars deranged 2/2 to steroids-range 205->248  Code Status: Full Family Communication:  Discussed with family at the bedside, wife Disposition Plan: inpatient   Verneita Griffes, MD  [facility] Pager [phone] 12/21/2013, 9:05 AM    LOS: 2 days

## 2013-12-21 NOTE — Plan of Care (Signed)
Problem: ICU Phase Progression Outcomes Goal: Dyspnea controlled at rest Outcome: Completed/Met Date Met:  12/21/13

## 2013-12-21 NOTE — Plan of Care (Signed)
Problem: ICU Phase Progression Outcomes Goal: Pain controlled with appropriate interventions Outcome: Completed/Met Date Met:  12/21/13

## 2013-12-21 NOTE — Progress Notes (Signed)
Pt was found in room 18. Confusion noted. Pt was uncertain of where he was and why. RN attempted to orient patient and calm. Pt anxious and wanted to speak to wife. Pt spoke to wife and wife stated she would come stay with him. Pt became tearful and upset. After wandering halls, O2 was 96% and HR 122. Will continue to monitor.

## 2013-12-21 NOTE — Plan of Care (Signed)
Problem: Phase I Progression Outcomes Goal: Discharge plan established Outcome: Progressing     

## 2013-12-22 ENCOUNTER — Inpatient Hospital Stay (HOSPITAL_COMMUNITY): Payer: Medicare HMO

## 2013-12-22 LAB — GLUCOSE, CAPILLARY
GLUCOSE-CAPILLARY: 211 mg/dL — AB (ref 70–99)
GLUCOSE-CAPILLARY: 197 mg/dL — AB (ref 70–99)
Glucose-Capillary: 256 mg/dL — ABNORMAL HIGH (ref 70–99)

## 2013-12-22 MED ORDER — METHYLPREDNISOLONE SODIUM SUCC 40 MG IJ SOLR
40.0000 mg | Freq: Two times a day (BID) | INTRAMUSCULAR | Status: DC
  Administered 2013-12-22 – 2013-12-23 (×2): 40 mg via INTRAVENOUS
  Filled 2013-12-22 (×4): qty 1

## 2013-12-22 MED ORDER — GUAIFENESIN ER 600 MG PO TB12
1200.0000 mg | ORAL_TABLET | Freq: Two times a day (BID) | ORAL | Status: DC
  Administered 2013-12-22 – 2013-12-23 (×3): 1200 mg via ORAL
  Filled 2013-12-22 (×4): qty 2

## 2013-12-22 MED ORDER — AMLODIPINE BESYLATE 10 MG PO TABS
10.0000 mg | ORAL_TABLET | Freq: Every day | ORAL | Status: DC
  Administered 2013-12-22 – 2013-12-23 (×2): 10 mg via ORAL
  Filled 2013-12-22 (×2): qty 1

## 2013-12-22 NOTE — Progress Notes (Addendum)
JI FELDNER PNT:[id number] DOB: 03/21/1929 DOA: 12/19/2013 PCP: Myriam Jacobson, MD  Brief narrative: 80 y/o ? h/o COPD + Emphysema, Esophagectomy 2007-recently Rx for COPD in OP c PRednisone taper and recently seen [place] office 11/20.  Had further decline and came to Ed for work-up.  Pulmonology saw the patient and made recommendations.  Past medical history-As per Problem list Chart reviewed as below-   Consultants:  Pulm  Procedures:  none  Antibiotics:  Doxycycline 11/26   Subjective   Still has productive sputum, Wheezing somewhat but seems a little better Somewhat emotionally labile Wife in room No nausea no vomiting Tolerating diet   Objective    Interim History:   Telemetry:    Objective: Filed Vitals:   12/22/13 0416 12/22/13 0539 12/22/13 0609 12/22/13 0810  BP:   167/84   Pulse:   92   Temp:   98.2 F (36.8 C)   TempSrc:   Oral   Resp:   20   Height:      Weight:  75.66 kg (166 lb 12.8 oz)    SpO2: 94%  94% 94%    Intake/Output Summary (Last 24 hours) at 12/22/13 0942 Last data filed at 12/22/13 0500  Gross per 24 hour  Intake    600 ml  Output      0 ml  Net    600 ml    Exam:  General: eomi, ncat Cardiovascular: s1 s 2 no m/r/g Respiratory: wheeze noted bilaterally, no rales, no rhonchi Abdomen: soft, Noted hernia epigastric-lage  ~ 15 cm  Data Reviewed: Basic Metabolic Panel:  Recent Labs Lab 12/19/13 2021 12/20/13 0404  NA 139 135*  K 4.1 4.3  CL 99 95*  CO2 18* 20  GLUCOSE 196* 273*  BUN 16 15  CREATININE 1.12 0.98  CALCIUM 9.9 9.6   Liver Function Tests:  Recent Labs Lab 12/19/13 2021  AST 25  ALT 31  ALKPHOS 57  BILITOT 0.4  PROT 7.0  ALBUMIN 3.9   No results for input(s): LIPASE, AMYLASE in the last 168 hours. No results for input(s): AMMONIA in the last 168 hours. CBC:  Recent Labs Lab 12/19/13 2021 12/20/13 0404  WBC 9.8 5.6  NEUTROABS 5.8  --   HGB 13.1 12.4*  HCT 41.6 38.2*   MCV 90.2 89.3  PLT 247 249   Cardiac Enzymes: No results for input(s): CKTOTAL, CKMB, CKMBINDEX, TROPONINI in the last 168 hours. BNP: Invalid input(s): POCBNP CBG:  Recent Labs Lab 12/21/13 0837 12/21/13 1339 12/21/13 1726 12/21/13 2150 12/22/13 0827  GLUCAP 205* 212* 240* 199* 256*    Recent Results (from the past 240 hour(s))  Culture, sputum-assessment     Status: None   Collection Time: 12/21/13  8:36 AM  Result Value Ref Range Status   Specimen Description SPUTUM  Final   Special Requests NONE  Final   Sputum evaluation   Final    THIS SPECIMEN IS ACCEPTABLE. RESPIRATORY CULTURE REPORT TO FOLLOW.   Report Status 12/21/2013 FINAL  Final  Culture, respiratory (NON-Expectorated)     Status: None (Preliminary result)   Collection Time: 12/21/13  8:36 AM  Result Value Ref Range Status   Specimen Description SPUTUM  Final   Special Requests NONE  Final   Gram Stain   Final    MODERATE WBC PRESENT, PREDOMINANTLY PMN MODERATE SQUAMOUS EPITHELIAL CELLS PRESENT ABUNDANT GRAM NEGATIVE RODS ABUNDANT GRAM POSITIVE COCCI IN PAIRS IN CLUSTERS IN CHAINS FEW GRAM POSITIVE RODS  Culture PENDING  Incomplete   Report Status PENDING  Incomplete     Studies:              All Imaging reviewed and is as per above notation   Scheduled Meds: . albuterol  2.5 mg Nebulization Q4H  . amLODipine  5 mg Oral Daily  . aspirin EC  81 mg Oral Daily  . brimonidine  1 drop Both Eyes BID  . cholecalciferol  1,000 Units Oral Daily  . diphenhydrAMINE  25 mg Oral 4 times per day  . doxycycline  100 mg Oral Q12H  . fluticasone  2 spray Each Nare Daily  . heparin  5,000 Units Subcutaneous 3 times per day  . insulin aspart  0-9 Units Subcutaneous TID WC  . loratadine  10 mg Oral Daily  . losartan  100 mg Oral Daily  . methylPREDNISolone (SOLU-MEDROL) injection  60 mg Intravenous Q12H  . pantoprazole  40 mg Oral Daily  . sodium chloride  3 mL Intravenous Q12H  . zinc sulfate  220 mg Oral  Daily   Continuous Infusions:    Assessment/Plan:  1. Acute exacerbation Gold Stage COPD "D"-taper ALbuterol 2.5 q 4 scheduled, Flonase 2 spray.  Cont clartitin.  Due to wheeze will continue Solu-medrol today 60 mg.  We'll transition to by mouth steroids and discharged home hopefully 11/30.  Doxycycline total 5 days needed. Because productive sputum I will add Mucinex and repeat a chest x-ray to make sure you don't have any other issues like pneumonia 2 worry about 2. Htn continue chronic meds amlodipine 5--> increased to 10 mg daily, clonidine 0.2 q 12 prn, Losartan 100 daily-blood pressure not at goal.   3. Gerd-continue Protonix 40 daily 4. Uncontrolled DM ty 2-Sugars deranged 2/2 to steroids-range 199-256.  Add lantus 5 qhs for now  Code Status: Full Family Communication:  Discussed with family at the bedside, wife Disposition Plan: inpatient-he is ambulatory and does not have any ambulation needs at present   Verneita Griffes, MD  [facility] Pager [phone] 12/22/2013, 9:42 AM    LOS: 3 days

## 2013-12-22 NOTE — Progress Notes (Signed)
   Name: Chad [name] MRN: [id number] DOB: 1929-11-19    ADMISSION DATE:  12/19/2013 CONSULTATION DATE:  12/22/2013  REFERRING MD :  TRH  CHIEF COMPLAINT:   dyspnea  BRIEF PATIENT DESCRIPTION:  80 y.o. M Copd Gold D  With frequent exacerbations. Adm 11/26 with another flare.  Note pt ill all week and did not call our office.   SUBJECTIVE:  Ongoing dyspnea but better , some anxiety and confusion   VITAL SIGNS: Temp:  [98.2 F (36.8 C)-99.5 F (37.5 C)] 98.2 F (36.8 C) (11/29 0609) Pulse Rate:  [92-106] 92 (11/29 0609) Resp:  [18-20] 20 (11/29 0609) BP: (134-167)/(72-84) 167/84 mmHg (11/29 0609) SpO2:  [93 %-98 %] 94 % (11/29 0810) Weight:  [75.66 kg (166 lb 12.8 oz)] 75.66 kg (166 lb 12.8 oz) (11/29 0539)  PHYSICAL EXAMINATION: General:  Elderly Male in nad Neuro:  Awake and alert HEENT:  No JVD  Cardiovascular:  RRR nl s1 s2  Lungs:  Distant bs, exp wheezes Abdomen:  Soft nt bsa Musculoskeletal:  from Skin:  clear   Recent Labs Lab 12/19/13 2021 12/20/13 0404  NA 139 135*  K 4.1 4.3  CL 99 95*  CO2 18* 20  BUN 16 15  CREATININE 1.12 0.98  GLUCOSE 196* 273*    Recent Labs Lab 12/19/13 2021 12/20/13 0404  HGB 13.1 12.4*  HCT 41.6 38.2*  WBC 9.8 5.6  PLT 247 249   No results found.  ASSESSMENT / PLAN:  #1 copd Gold D with frequent exacerbations.  Pt did not call when he was declining.    Plan   Cont steroids, ok to reduce dose to 40mg  q12h   Flutter valve   I enrolled pt in Gold Copd program, see order sets   Pt instructed to call next time earlier   Doubt will need oxygen   Upon d/c send out on pred/doxy PO with slow steroid taper   Please give pt Rx for his own nebulizer and also refill albuterol    In nebulizer   Get pt appt to f/u with [facility] Tammy parrett short term 5    days or less after d/c prob can d/c in AM 11/30 [place]  [phone]  Cell  [phone]  If no response or cell goes to voicemail, call beeper  [phone]  Pulmonary and [place] Pager: [phone]  12/22/2013, 9:52 AM

## 2013-12-22 NOTE — Clinical Social Work Note (Signed)
CSW attempted to meet with patient, however patient was asleep. CSW spoke with patient's RN, Bailey Mech who states patient's wife was at bedside this am. CSW to follow tomorrow.  Shelly, [place] Weekend Clinical Social Worker [phone]

## 2013-12-22 NOTE — Evaluation (Signed)
Occupational Therapy Evaluation Patient Details Name: Chad [name] MRN: [id number] DOB: 10/06/1929 Today's Date: 12/22/2013    History of Present Illness  Chad [name] is a 80 y.o. male with past medical history of COPD, hypertension, hyperlipidemia, diabetes mellitus, esophageal stenosis, who presents with worsening shortness of breath and wheezing.   Clinical Impression   Patient evaluated by Occupational Therapy with no further acute OT needs identified. All education has been completed and the patient has no further questions. See below for any follow-up Occupational Therapy or equipment needs. OT to sign off. Thank you for referral.      Follow Up Recommendations  No OT follow up    Equipment Recommendations  None recommended by OT    Recommendations for Other Services       Precautions / Restrictions Precautions Precautions: None      Mobility Bed Mobility Overal bed mobility: Modified Independent                Transfers Overall transfer level: Modified independent                    Balance Overall balance assessment: No apparent balance deficits (not formally assessed)                                          ADL Overall ADL's : Modified independent                                             Vision                     Perception     Praxis      Pertinent Vitals/Pain Pain Assessment: No/denies pain     Hand Dominance Right   Extremity/Trunk Assessment Upper Extremity Assessment Upper Extremity Assessment: Overall WFL for tasks assessed   Lower Extremity Assessment Lower Extremity Assessment: Defer to PT evaluation   Cervical / Trunk Assessment Cervical / Trunk Assessment: Normal   Communication Communication Communication: No difficulties   Cognition Arousal/Alertness: Awake/alert Behavior During Therapy: WFL for tasks assessed/performed Overall Cognitive Status:  Impaired/Different from baseline Area of Impairment: Memory     Memory: Decreased short-term memory         General Comments: reports month at december    General Comments       Exercises       Shoulder Instructions      Home Living Family/patient expects to be discharged to:: Private residence Living Arrangements: Spouse/significant other Available Help at Discharge: Family;Available 24 hours/day Type of Home: House Home Access: Stairs to enter [employer] of Steps: several Entrance Stairs-Rails: Left;Right Home Layout: One level     Bathroom Shower/Tub: Occupational psychologist: Handicapped height     Home Equipment: Grab bars - toilet;Grab bars - tub/shower;Walker - 2 wheels   Additional Comments: wife is retired and can be at home with patient      Prior Functioning/Environment Level of Independence: Independent             OT Diagnosis:     OT Problem List:     OT Treatment/Interventions:      OT Goals(Current goals can be found in the care plan section)  OT Frequency:     Barriers to D/C:            Co-evaluation  educated patient on energy conservation and water temperature for respiratory when showering. Wife present and verbalized being able to (A) at current level .             End of Session Equipment Utilized During Treatment: Gait belt Nurse Communication: Mobility status;Precautions  Activity Tolerance: Patient tolerated treatment well Patient left: in bed;with call bell/phone within reach;with family/visitor present   Time: 1139-1155 OT Time Calculation (min): 16 min Charges:  OT General Charges $OT Visit: 1 Procedure OT Evaluation $Initial OT Evaluation Tier I: 1 Procedure OT Treatments $Self Care/Home Management : 8-22 mins G-Codes:    Parke Poisson B 21-Jan-2014, 11:57 AM  Pager: [phone]

## 2013-12-22 NOTE — Plan of Care (Signed)
Problem: Phase I Progression Outcomes Goal: Hemodynamically stable Outcome: Progressing Goal: Flu/PneumoVaccines if indicated Outcome: Progressing Goal: Pt OOB to Walk or Exercise Daily With Nursing or PT Patient OOB to walk or exercise daily with nursing or PT if activity order permits  Outcome: Completed/Met Date Met:  12/22/13 Goal: Discharge plan established Outcome: Progressing

## 2013-12-23 LAB — RESPIRATORY VIRUS PANEL
Adenovirus: NOT DETECTED
Influenza A H1: NOT DETECTED
Influenza A H3: NOT DETECTED
Influenza A: NOT DETECTED
Influenza B: NOT DETECTED
Metapneumovirus: NOT DETECTED
Parainfluenza 1: NOT DETECTED
Parainfluenza 2: NOT DETECTED
Parainfluenza 3: NOT DETECTED
Respiratory Syncytial Virus A: NOT DETECTED
Respiratory Syncytial Virus B: NOT DETECTED
Rhinovirus: NOT DETECTED

## 2013-12-23 LAB — CULTURE, RESPIRATORY W GRAM STAIN

## 2013-12-23 LAB — CULTURE, RESPIRATORY: CULTURE: NORMAL

## 2013-12-23 LAB — GLUCOSE, CAPILLARY
GLUCOSE-CAPILLARY: 246 mg/dL — AB (ref 70–99)
Glucose-Capillary: 292 mg/dL — ABNORMAL HIGH (ref 70–99)

## 2013-12-23 MED ORDER — PREDNISONE 20 MG PO TABS
20.0000 mg | ORAL_TABLET | Freq: Every day | ORAL | Status: DC
Start: 2013-12-23 — End: 2014-01-06

## 2013-12-23 MED ORDER — DOXYCYCLINE HYCLATE 100 MG PO TABS: 100.0000 mg | ORAL_TABLET | Freq: Two times a day (BID) | ORAL | Status: DC

## 2013-12-23 MED ORDER — GUAIFENESIN ER 600 MG PO TB12: 1200.0000 mg | ORAL_TABLET | Freq: Two times a day (BID) | ORAL | Status: DC

## 2013-12-23 NOTE — Discharge Summary (Signed)
Physician Discharge Summary  Chad [name] EUM:[id number] DOB: 1929/10/04 DOA: 12/19/2013  PCP: Myriam Jacobson, MD  Admit date: 12/19/2013 Discharge date: 12/23/2013  Time spent: 35 minutes  Recommendations for Outpatient Follow-up:  1. Patient given tapering doses steroids to stop 12/29/13 2. To be seen at [facility] office in 2-3 days will forward note 3. Reassess blood pressures closely  4. mopnitor blood sugars as on steorids   Discharge Diagnoses:  Principal Problem:   Obstructive chronic bronchitis with exacerbation, Copd Gold D Active Problems:   HLD (hyperlipidemia)   Glaucoma   Essential hypertension   Esophageal stenosis   COPD exacerbation   Diabetes mellitus without complication   Discharge Condition: good  Diet recommendation: heart healthy diabetic  Filed Weights   12/20/13 0500 12/22/13 0539 12/23/13 0628  Weight: 76.613 kg (168 lb 14.4 oz) 75.66 kg (166 lb 12.8 oz) 77.656 kg (171 lb 3.2 oz)    History of present illness:  80 y/o ? h/o COPD + Emphysema, Esophagectomy 2007-recently Rx for COPD in OP c PRednisone taper and recently seen [place] office 11/20. Had further decline and came to [facility] for work-up. Pulmonology saw the patient and made recommendations.  Hospital Course:   1. Acute exacerbation Gold Stage COPD "D"-taper ALbuterol 2.5 q 4 scheduled, Flonase 2 spray. Cont clartitin. Due to wheeze was on prolonged course Solu-medrol 60 mg for 3-4 days. On d/c to by mouth steroids 60 mg and 40 mg taper ending 12/6. extended Doxycycline total fopr 7 days ending 12/2. CXR 11/29 was beninign for PNA 2. Htn continue chronic meds amlodipine 5--> increased to 10 mg daily, clonidine 0.2 q 12 prn, Losartan 100 daily-blood pressure not at goal. needs adjustment as OP 3. Gerd-continue Protonix 40 daily 4. Uncontrolled DM ty 2-Sugars deranged 2/2 to steroids-range 199-256. Added lantus 5 qhsin hospital stay.  OP follow  up  Consultants: 5. Pulm  Procedures:  none  Antibiotics:  Doxycycline 11/26-->12/2    Discharge Exam: Filed Vitals:   12/23/13 0906  BP: 120/69  Pulse:   Temp:   Resp: 20    In good spirits,feels better  General: alert pleasant oriented in nad Cardiovascular: s1 s 2no m/r/g Respiratory: clear mild wheeze only  Discharge Instructions You were cared for by a hospitalist during your hospital stay. If you have any questions about your discharge medications or the care you received while you were in the hospital after you are discharged, you can call the unit and asked to speak with the hospitalist on call if the hospitalist that took care of you is not available. Once you are discharged, your primary care physician will handle any further medical issues. Please note that NO REFILLS for any discharge medications will be authorized once you are discharged, as it is imperative that you return to your primary care physician (or establish a relationship with a primary care physician if you do not have one) for your aftercare needs so that they can reassess your need for medications and monitor your lab values.   Current Discharge Medication List    CONTINUE these medications which have NOT CHANGED   Details  albuterol (PROAIR HFA) 108 (90 BASE) MCG/ACT inhaler Inhale 1-2 puffs into the lungs every 6 (six) hours as needed. For breathing    albuterol (PROVENTIL) (2.5 MG/3ML) 0.083% nebulizer solution 1 vial in nebulizer every 4-6 hours as needed for wheezing/shortness of breath. Qty: 75 mL, Refills: 12   Associated Diagnoses: COPD exacerbation    amLODipine (NORVASC)  5 MG tablet Take 5 mg by mouth daily.      aspirin 81 MG tablet Take 81 mg by mouth daily.      brimonidine (ALPHAGAN P) 0.1 % SOLN Place 1 drop into the left eye 2 (two) times daily.      chlorpheniramine (CHLOR-TRIMETON) 4 MG tablet Take 4 mg by mouth 2 (two) times daily as needed for allergies.     Cholecalciferol (VITAMIN D) 1000 UNITS capsule Take 1,000 Units by mouth daily.      cloNIDine (CATAPRES) 0.2 MG tablet 1 tab by mouth as needed when BP is over 140     diazepam (VALIUM) 5 MG tablet Take 2.5 mg by mouth daily as needed. For anxiety    fluticasone (FLONASE) 50 MCG/ACT nasal spray Place 2 sprays into both nostrils daily. Qty: 16 g, Refills: 2    formoterol (FORADIL AEROLIZER) 12 MCG capsule for inhaler Place 12 mcg into inhaler and inhale 2 (two) times daily.    glipiZIDE (GLUCOTROL XL) 2.5 MG 24 hr tablet Take 2.5 mg by mouth daily.    guaiFENesin (MUCINEX) 600 MG 12 hr tablet Take 600 mg by mouth 2 (two) times daily as needed.    HYDROcodone-acetaminophen (NORCO/VICODIN) 5-325 MG per tablet Take 1 tablet by mouth 2 (two) times daily as needed (cough).    loratadine (CLARITIN) 10 MG tablet Take 10 mg by mouth daily.    losartan (COZAAR) 100 MG tablet Take 100 mg by mouth daily.      metFORMIN (GLUCOPHAGE) 500 MG tablet Take 1,000 mg by mouth 2 (two) times daily with a meal.      naproxen sodium (ANAPROX) 220 MG tablet Take 220 mg by mouth as needed.    omeprazole (PRILOSEC) 20 MG capsule Take 40 mg by mouth 2 (two) times daily.     Potassium 99 MG TABS Take 1 tablet by mouth daily.      saw palmetto 500 MG capsule Take 500 mg by mouth daily.      tiotropium (SPIRIVA) 18 MCG inhalation capsule Place 18 mcg into inhaler and inhale daily.      valACYclovir (VALTREX) 500 MG tablet Take 1,000 mg by mouth every 8 (eight) hours as needed. For out breaks    Zinc 100 MG TABS Take 1 tablet by mouth daily.         Allergies  Allergen Reactions  . Moxifloxacin Anaphylaxis    Avelox REACTION: anaphylaxis  . Penicillins     REACTION: hives  . Sulfonamide Derivatives     REACTION: hives  . Timolol Maleate     Causes severe chest congestion   Follow-up Information    Follow up with PARRETT,TAMMY, NP. Schedule an appointment as soon as possible for a visit in 3  days.   Specialty:  Nurse Practitioner   Contact information:   [address] [address] [phone]       Follow up with ROBERTS, Sharol Given, MD. Schedule an appointment as soon as possible for a visit in 1 week.   Specialty:  Internal Medicine   Contact information:   [place], [place]  [place] [address] [phone]        The results of significant diagnostics from this hospitalization (including imaging, microbiology, ancillary and laboratory) are listed below for reference.    Significant Diagnostic Studies: Dg Chest 2 View  12/22/2013   CLINICAL DATA:  Pneumonia  EXAM: CHEST  2 VIEW  COMPARISON:  12/19/2013  FINDINGS: Cardiomediastinal  silhouette is stable. Large hiatal hernia again noted. Small atelectasis or infiltrate silhouetting the left heart border. Right lung is clear. No pulmonary edema.  IMPRESSION: Large hiatal hernia. Small atelectasis or infiltrate silhouetting the left heart border. No pulmonary edema. Right lung is clear.   Electronically Signed   By: Lahoma Crocker M.D.   On: 12/22/2013 12:58   Dg Chest Port 1 View  12/19/2013   CLINICAL DATA:  Shortness of breath for 6 weeks, hypoxia. History of COPD and nicotine use.  EXAM: PORTABLE CHEST - 1 VIEW  COMPARISON:  Chest radiograph November 19, 2013  FINDINGS: Cardiac silhouette is upper limits of normal size, mediastinal silhouette is nonsuspicious, mildly calcified aortic knob. No pleural effusions or focal consolidations. Similar hazy density in LEFT lung base may reflect pleural thickening or plaque. Large suspected hiatal hernia. No pneumothorax.  Surgical clip in LEFT neck may reflect thyroidectomy. Patient is osteopenic. Mildly widened RIGHT acromioclavicular joint space may reflect shoulder separation or, projection artifact.  IMPRESSION: Borderline cardiomegaly, no acute pulmonary process.   Electronically Signed   By: Elon Alas   On: 12/19/2013 21:09    Microbiology: Recent  Results (from the past 240 hour(s))  Culture, sputum-assessment     Status: None   Collection Time: 12/21/13  8:36 AM  Result Value Ref Range Status   Specimen Description SPUTUM  Final   Special Requests NONE  Final   Sputum evaluation   Final    THIS SPECIMEN IS ACCEPTABLE. RESPIRATORY CULTURE REPORT TO FOLLOW.   Report Status 12/21/2013 FINAL  Final  Culture, respiratory (NON-Expectorated)     Status: None (Preliminary result)   Collection Time: 12/21/13  8:36 AM  Result Value Ref Range Status   Specimen Description SPUTUM  Final   Special Requests NONE  Final   Gram Stain   Final    MODERATE WBC PRESENT, PREDOMINANTLY PMN MODERATE SQUAMOUS EPITHELIAL CELLS PRESENT ABUNDANT GRAM NEGATIVE RODS ABUNDANT GRAM POSITIVE COCCI IN PAIRS IN CLUSTERS IN CHAINS FEW GRAM POSITIVE RODS    Culture   Final    Culture reincubated for better growth Performed at [facility]    Report Status PENDING  Incomplete     Labs: Basic Metabolic Panel:  Recent Labs Lab 12/19/13 2021 12/20/13 0404  NA 139 135*  K 4.1 4.3  CL 99 95*  CO2 18* 20  GLUCOSE 196* 273*  BUN 16 15  CREATININE 1.12 0.98  CALCIUM 9.9 9.6   Liver Function Tests:  Recent Labs Lab 12/19/13 2021  AST 25  ALT 31  ALKPHOS 57  BILITOT 0.4  PROT 7.0  ALBUMIN 3.9   No results for input(s): LIPASE, AMYLASE in the last 168 hours. No results for input(s): AMMONIA in the last 168 hours. CBC:  Recent Labs Lab 12/19/13 2021 12/20/13 0404  WBC 9.8 5.6  NEUTROABS 5.8  --   HGB 13.1 12.4*  HCT 41.6 38.2*  MCV 90.2 89.3  PLT 247 249   Cardiac Enzymes: No results for input(s): CKTOTAL, CKMB, CKMBINDEX, TROPONINI in the last 168 hours. BNP: BNP (last 3 results) No results for input(s): PROBNP in the last 8760 hours. CBG:  Recent Labs Lab 12/21/13 2150 12/22/13 0827 12/22/13 1223 12/22/13 1728 12/23/13 0746  GLUCAP 199* 256* 211* 197* 246*       Signed:  Nita Sells  [facility] 12/23/2013, 9:28 AM

## 2013-12-23 NOTE — Progress Notes (Signed)
Contacted Barrie Dunker in attempts to help register patient for COPD Gold at d/c. Packet given and education provided to patient and wife.

## 2013-12-23 NOTE — Care Management Note (Signed)
    Page 1 of 2   12/23/2013     4:11:28 PM CARE MANAGEMENT NOTE 12/23/2013  Patient:  Chad [name], Chad [name]   Account Number:  [id number]  Date Initiated:  12/20/2013  Documentation initiated by:  Marylyn Ishihara  Subjective/Objective Assessment:   Patient admitted from home with worsening SOB.     Action/Plan:   COPD Protocol   Anticipated DC Date:  12/23/2013   Anticipated DC Plan:  [place]  CM consult      [facility] Choice  HOME HEALTH   Choice offered to / List presented to:  C-3 Spouse        HH arranged  HH-10 DISEASE MANAGEMENT  HH-1 RN      [place].   Status of service:  Completed, signed off Medicare Important Message given?  YES (If response is "NO", the following Medicare IM given date fields will be blank) Date Medicare IM given:  12/23/2013 Medicare IM given by:  Tomi Bamberger Date Additional Medicare IM given:   Additional Medicare IM given by:    Discharge Disposition:  [place]  Per UR Regulation:  Reviewed for med. necessity/level of care/duration of stay  If discussed at [place] of Stay Meetings, dates discussed:    Comments:  12/23/13 [place] ,BSN [phone] patient is for dc today, patient does not want hhpt, but they do want a HHRN, they [facility] , referral made to [facility] for [facility], soc will begin 24-48 hrs post dc.  12/20/2013  [place], RN, BSN, CCM CM spoke with wife, wants to discuss later.   Patient already has a PCP and is been followed by the Pulmonary Clinic. Discharge needs will continue to be assessed.

## 2013-12-23 NOTE — Progress Notes (Signed)
Inpatient Diabetes Program Recommendations  AACE/ADA: New Consensus Statement on Inpatient Glycemic Control (2013)  Target Ranges:  Prepandial:   less than 140 mg/dL      Peak postprandial:   less than 180 mg/dL (1-2 hours)      Critically ill patients:  140 - 180 mg/dL     Results for Chad [name], Chad [name] (MRN [id number]) as of 12/23/2013 08:58  Ref. Range 12/22/2013 08:27 12/22/2013 12:23 12/22/2013 17:28  Glucose-Capillary Latest Range: 70-99 mg/dL 256 (H) 211 (H) 197 (H)    Results for Chad [name], Chad [name] (MRN [id number]) as of 12/23/2013 08:58  Ref. Range 12/23/2013 07:46  Glucose-Capillary Latest Range: 70-99 mg/dL 246 (H)     Admitted with SOB/ Wheezing.  History of DM, COPD, HTN.  Home DM Meds: Glipizide 2.5 mg daily       Metformin 1000 mg bid  Current DM Meds: Novolog Sensitive SSI   **Patient currently getting IV Solumedrol 40 mg Q12 hours.  Having elevated glucose levels.    **Note per MD notes from yesterday (11/29) that plan was to start Lantus 5 units QHS but Lantus never ordered.   MD- Please consider starting Lantus insulin for this patient    Will follow Wyn Quaker RN, MSN, CDE Diabetes Coordinator Inpatient Diabetes Program Team Pager: [phone] (8a-10p)

## 2013-12-23 NOTE — Progress Notes (Signed)
Chad [name] to be D/C'd Home per MD order.  Discussed with the patient and all questions fully answered.    Medication List    STOP taking these medications        loratadine 10 MG tablet  Commonly known as:  CLARITIN     naproxen sodium 220 MG tablet  Commonly known as:  ANAPROX     Potassium 99 MG Tabs      TAKE these medications        amLODipine 5 MG tablet  Commonly known as:  NORVASC  Take 5 mg by mouth daily.     aspirin 81 MG tablet  Take 81 mg by mouth daily.     brimonidine 0.1 % Soln  Commonly known as:  ALPHAGAN P  Place 1 drop into the left eye 2 (two) times daily.     chlorpheniramine 4 MG tablet  Commonly known as:  CHLOR-TRIMETON  Take 4 mg by mouth 2 (two) times daily as needed for allergies.     cloNIDine 0.2 MG tablet  Commonly known as:  CATAPRES  1 tab by mouth as needed when BP is over 140     diazepam 5 MG tablet  Commonly known as:  VALIUM  Take 2.5 mg by mouth daily as needed. For anxiety     doxycycline 100 MG tablet  Commonly known as:  VIBRA-TABS  Take 1 tablet (100 mg total) by mouth every 12 (twelve) hours.     fluticasone 50 MCG/ACT nasal spray  Commonly known as:  FLONASE  Place 2 sprays into both nostrils daily.     FORADIL AEROLIZER 12 MCG capsule for inhaler  Generic drug:  formoterol  Place 12 mcg into inhaler and inhale 2 (two) times daily.     glipiZIDE 2.5 MG 24 hr tablet  Commonly known as:  GLUCOTROL XL  Take 2.5 mg by mouth daily.     guaiFENesin 600 MG 12 hr tablet  Commonly known as:  MUCINEX  Take 2 tablets (1,200 mg total) by mouth 2 (two) times daily.     HYDROcodone-acetaminophen 5-325 MG per tablet  Commonly known as:  NORCO/VICODIN  Take 1 tablet by mouth 2 (two) times daily as needed (cough).     losartan 100 MG tablet  Commonly known as:  COZAAR  Take 100 mg by mouth daily.     metFORMIN 500 MG tablet  Commonly known as:  GLUCOPHAGE  Take 1,000 mg by mouth 2 (two) times daily with a meal.      omeprazole 20 MG capsule  Commonly known as:  PRILOSEC  Take 40 mg by mouth 2 (two) times daily.     predniSONE 20 MG tablet  Commonly known as:  DELTASONE  Take 1 tablet (20 mg total) by mouth daily with breakfast.     PROAIR HFA 108 (90 BASE) MCG/ACT inhaler  Generic drug:  albuterol  Inhale 1-2 puffs into the lungs every 6 (six) hours as needed. For breathing     albuterol (2.5 MG/3ML) 0.083% nebulizer solution  Commonly known as:  PROVENTIL  1 vial in nebulizer every 4-6 hours as needed for wheezing/shortness of breath.     saw palmetto 500 MG capsule  Take 500 mg by mouth daily.     tiotropium 18 MCG inhalation capsule  Commonly known as:  SPIRIVA  Place 18 mcg into inhaler and inhale daily.     valACYclovir 500 MG tablet  Commonly known as:  VALTREX  Take  1,000 mg by mouth every 8 (eight) hours as needed. For out breaks     Vitamin D 1000 UNITS capsule  Take 1,000 Units by mouth daily.     Zinc 100 MG Tabs  Take 1 tablet by mouth daily.        VVS, Skin clean, dry and intact without evidence of skin break down, no evidence of skin tears noted. IV catheter discontinued intact. Site without signs and symptoms of complications. Dressing and pressure applied.  An After Visit Summary was printed and given to the patient.  D/c education completed with patient/family including follow up instructions, medication list, d/c activities limitations if indicated, with other d/c instructions as indicated by MD - patient able to verbalize understanding, all questions fully answered.   Patient instructed to return to ED, call 911, or call MD for any changes in condition.   Patient escorted via [place], and D/C home via private auto.  Delman Cheadle 12/23/2013 1:21 PM

## 2013-12-24 ENCOUNTER — Telehealth: Payer: Self-pay | Admitting: Critical Care Medicine

## 2013-12-24 NOTE — Telephone Encounter (Signed)
Called the pharmacy and waiting on them to fax over the form to do the PA for the albuterol neb medication.

## 2013-12-26 ENCOUNTER — Ambulatory Visit (INDEPENDENT_AMBULATORY_CARE_PROVIDER_SITE_OTHER): Payer: Medicare HMO | Admitting: Adult Health

## 2013-12-26 ENCOUNTER — Encounter: Payer: Self-pay | Admitting: Adult Health

## 2013-12-26 ENCOUNTER — Other Ambulatory Visit: Payer: Medicare HMO

## 2013-12-26 VITALS — BP 130/62 | HR 81 | Temp 97.7°F | Ht 69.0 in | Wt 176.8 lb

## 2013-12-26 DIAGNOSIS — J441 Chronic obstructive pulmonary disease with (acute) exacerbation: Secondary | ICD-10-CM

## 2013-12-26 MED ORDER — ALBUTEROL SULFATE (2.5 MG/3ML) 0.083% IN NEBU: INHALATION_SOLUTION | RESPIRATORY_TRACT | Status: DC

## 2013-12-26 NOTE — Assessment & Plan Note (Signed)
Recent exacerbation with hospitalization. Now improving  Plan   Finish Prednisone taper as directed.  Mucinex DM twice daily as needed for cough and congestion. May use albuterol nebulizer every 4-6 hours as needed for wheezing Follow Dr. Joya Gaskins in 2  weeks as planned and as needed Please contact office for sooner follow up if symptoms do not improve or worsen or seek emergency care

## 2013-12-26 NOTE — Progress Notes (Signed)
   Subjective:    Patient ID: Chad [name], male    DOB: 1929/12/19, 80 y.o.   MRN: [id number]  HPI  80 yo male with COPD primary emphysematous component.  Prior esophagectomy 2007 and abn CXR since that surgery.   12/26/2013 [facility] follow up  Patient returns for post hospital follow-up. Patient was admitted November 2016 November 30 for a COPD exacerbation He was treated with aggressive IV antibiotics, steroids, and nebulized bronchodilators. Patient was discharged on a prednisone taper and a doxycycline seven-day course He feels that he is slowly improving. Continues to have some intermittent cough and wheezing. However, this is decreased. He denies any hemoptysis, chest pain, orthopnea, PND or leg swelling, diarrhea.     Review of Systems Constitutional:   No  weight loss, night sweats,  Fevers, chills, + fatigue, or  lassitude.  HEENT:   No headaches,  Difficulty swallowing,  Tooth/dental problems, or  Sore throat,                No sneezing, itching, ear ache,  +nasal congestion, post nasal drip,   CV:  No chest pain,  Orthopnea, PND, swelling in lower extremities, anasarca, dizziness, palpitations, syncope.   GI  No heartburn, indigestion, abdominal pain, nausea, vomiting, diarrhea, change in bowel habits, loss of appetite, bloody stools.   Resp:   No chest wall deformity  Skin: no rash or lesions.  GU: no dysuria, change in color of urine, no urgency or frequency.  No flank pain, no hematuria   MS:  No joint pain or swelling.  No decreased range of motion.  No back pain.  Psych:  No change in mood or affect. No depression or anxiety.  No memory loss.         Objective:   Physical Exam GEN: A/Ox3; pleasant , NAD, overweight   HEENT:  [place]/AT,  EACs-clear, TMs-wnl, NOSE-clear, THROAT-clear, no lesions, no postnasal drip or exudate noted.   NECK:  Supple w/ fair ROM; no JVD; normal carotid impulses w/o bruits; no thyromegaly or nodules palpated; no  lymphadenopathy.  RESP  Decreased BS w/ no wheezing  no accessory muscle use, no dullness to percussion  CARD:  RRR, no m/r/g  , no peripheral edema, pulses intact, no cyanosis or clubbing.  GI:   BS+ , abd binder in place, no organomegaly or masses detected.  Musco: Warm bil, no deformities or joint swelling noted.   Neuro: alert, no focal deficits noted.    Skin: Warm, no lesions or rashes         Assessment & Plan:

## 2013-12-26 NOTE — Telephone Encounter (Signed)
Form received from [employer] that needed to be filled out and faxed back by today.  This has been done and will wait to see if this medication is approved or denied.  Will forward to [place] to follow up on.

## 2013-12-26 NOTE — Patient Instructions (Signed)
Finish Prednisone taper as directed.  Mucinex DM twice daily as needed for cough and congestion. May use albuterol nebulizer every 4-6 hours as needed for wheezing Follow Dr. Joya Gaskins in 2  weeks as planned and as needed Please contact office for sooner follow up if symptoms do not improve or worsen or seek emergency care

## 2013-12-30 ENCOUNTER — Other Ambulatory Visit: Payer: Medicare HMO

## 2014-01-02 NOTE — Telephone Encounter (Signed)
Will send to Crystal to follow-up on. Please advise if you have received an approval/denial letter from [place]. Thanks.

## 2014-01-06 ENCOUNTER — Ambulatory Visit (INDEPENDENT_AMBULATORY_CARE_PROVIDER_SITE_OTHER): Payer: Medicare HMO | Admitting: Critical Care Medicine

## 2014-01-06 ENCOUNTER — Encounter: Payer: Self-pay | Admitting: Critical Care Medicine

## 2014-01-06 VITALS — BP 156/80 | HR 97 | Temp 97.1°F | Ht 69.0 in

## 2014-01-06 DIAGNOSIS — I10 Essential (primary) hypertension: Secondary | ICD-10-CM

## 2014-01-06 DIAGNOSIS — J441 Chronic obstructive pulmonary disease with (acute) exacerbation: Secondary | ICD-10-CM

## 2014-01-06 MED ORDER — AMLODIPINE BESYLATE 10 MG PO TABS: 10.0000 mg | ORAL_TABLET | Freq: Every day | ORAL | Status: DC

## 2014-01-06 MED ORDER — AMLODIPINE BESYLATE 5 MG PO TABS: 5.0000 mg | ORAL_TABLET | Freq: Every day | ORAL | Status: DC

## 2014-01-06 NOTE — Patient Instructions (Addendum)
Increase amlodipine to 5mg  daily ( 1 5mg  tabs ) No other changes Return 2 months

## 2014-01-06 NOTE — Progress Notes (Signed)
   Subjective:    Patient ID: Chad [name], male    DOB: [birth year], 80 y.o.   MRN: [id number]  HPI  80 yo male with COPD primary emphysematous component.  Prior esophagectomy 2007 and abn CXR since that surgery.   12/3 [facility] follow up  Patient returns for post hospital follow-up. Patient was admitted November 2016 November 30 for a COPD exacerbation He was treated with aggressive IV antibiotics, steroids, and nebulized bronchodilators. Patient was discharged on a prednisone taper and a doxycycline seven-day course He feels that he is slowly improving. Continues to have some intermittent cough and wheezing. However, this is decreased. He denies any hemoptysis, chest pain, orthopnea, PND or leg swelling, diarrhea.  01/06/2014 Chief Complaint  Patient presents with  . 2 wk follow up    Feels breathing is doing "very good."  SOB only when bending over.  Some cough with clear mucus.  No chest tightness/CP or wheezing.  Doing well now.  Less dyspnea.  No wheeze or chest pain .  No edema in feet  Pt denies any significant sore throat, nasal congestion or excess secretions, fever, chills, sweats, unintended weight loss, pleurtic or exertional chest pain, orthopnea PND, or leg swelling Pt denies any increase in rescue therapy over baseline, denies waking up needing it or having any early am or nocturnal exacerbations of coughing/wheezing/or dyspnea. Pt also denies any obvious fluctuation in symptoms with  weather or environmental change or other alleviating or aggravating factors      Review of Systems Constitutional:   No  weight loss, night sweats,  Fevers, chills, + fatigue, or  lassitude.  HEENT:   No headaches,  Difficulty swallowing,  Tooth/dental problems, or  Sore throat,                No sneezing, itching, ear ache,  +nasal congestion, post nasal drip,   CV:  No chest pain,  Orthopnea, PND, swelling in lower extremities, anasarca, dizziness, palpitations, syncope.    GI  No heartburn, indigestion, abdominal pain, nausea, vomiting, diarrhea, change in bowel habits, loss of appetite, bloody stools.   Resp:   No chest wall deformity  Skin: no rash or lesions.  GU: no dysuria, change in color of urine, no urgency or frequency.  No flank pain, no hematuria   MS:  No joint pain or swelling.  No decreased range of motion.  No back pain.  Psych:  No change in mood or affect. No depression or anxiety.  No memory loss.         Objective:   Physical Exam GEN: A/Ox3; pleasant , NAD, overweight   HEENT:  [place]/AT,  EACs-clear, TMs-wnl, NOSE-clear, THROAT-clear, no lesions, no postnasal drip or exudate noted.   NECK:  Supple w/ fair ROM; no JVD; normal carotid impulses w/o bruits; no thyromegaly or nodules palpated; no lymphadenopathy.  RESP  Decreased BS w/ no wheezing  no accessory muscle use, no dullness to percussion  CARD:  RRR, no m/r/g  , no peripheral edema, pulses intact, no cyanosis or clubbing.  GI:   BS+ , abd binder in place, no organomegaly or masses detected.  Musco: Warm bil, no deformities or joint swelling noted.   Neuro: alert, no focal deficits noted.    Skin: Warm, no lesions or rashes         Assessment & Plan:

## 2014-01-06 NOTE — Telephone Encounter (Signed)
Pt in for appt today.  Reports they picked up albuterol medication from [place] on the $4 list and no longer needs PA for this. Will sign off.

## 2014-01-07 ENCOUNTER — Telehealth: Payer: Self-pay | Admitting: Critical Care Medicine

## 2014-01-07 MED ORDER — AMLODIPINE BESYLATE 10 MG PO TABS: 10.0000 mg | ORAL_TABLET | Freq: Every day | ORAL | Status: DC

## 2014-01-07 NOTE — Assessment & Plan Note (Signed)
Gold Stage D copd with frequent exacerbations, now improved s/p recent hospital stay HTN not well controlled.  On low dose amlodipine Plan Increase amlodipine to 5mg  daily  No other changes Cont inhaled meds as prescribed Return 2 months

## 2014-01-07 NOTE — Telephone Encounter (Signed)
Spoke with pt's wife. She was mistaken with the dosage of his Amlodipine. He needs to be taking 10mg  once a day instead of 5mg . This prescription has been corrected and sent in. Nothing further was needed.

## 2014-01-13 ENCOUNTER — Ambulatory Visit (INDEPENDENT_AMBULATORY_CARE_PROVIDER_SITE_OTHER): Payer: Medicare HMO | Admitting: Adult Health

## 2014-01-13 ENCOUNTER — Encounter: Payer: Self-pay | Admitting: Adult Health

## 2014-01-13 VITALS — BP 110/74 | HR 94 | Temp 98.1°F | Ht 69.0 in | Wt 179.2 lb

## 2014-01-13 DIAGNOSIS — J441 Chronic obstructive pulmonary disease with (acute) exacerbation: Secondary | ICD-10-CM

## 2014-01-13 MED ORDER — PREDNISONE 10 MG PO TABS: ORAL_TABLET | ORAL | Status: DC

## 2014-01-13 NOTE — Progress Notes (Signed)
   Subjective:    Patient ID: Chad [name], male    DOB: [birth year], 80 y.o.   MRN: [id number]  HPI  80 yo male with COPD primary emphysematous component.  Prior esophagectomy 2007 and abn CXR since that surgery.   01/13/2014 Acute OV  Complains of 1 week of cough , clear mucus, wheezing and shortness of breath. Using mucinex .  No fever.  He denies any hemoptysis, chest pain, orthopnea, PND or leg swelling, diarrhea. Wheezing and cough are getting worse.  Appetite is good with no nausea, vomiting.       Review of Systems Constitutional:   No  weight loss, night sweats,  Fevers, chills, + fatigue, or  lassitude.  HEENT:   No headaches,  Difficulty swallowing,  Tooth/dental problems, or  Sore throat,                No sneezing, itching, ear ache,  +nasal congestion, post nasal drip,   CV:  No chest pain,  Orthopnea, PND, swelling in lower extremities, anasarca, dizziness, palpitations, syncope.   GI  No heartburn, indigestion, abdominal pain, nausea, vomiting, diarrhea, change in bowel habits, loss of appetite, bloody stools.   Resp:   No chest wall deformity  Skin: no rash or lesions.  GU: no dysuria, change in color of urine, no urgency or frequency.  No flank pain, no hematuria   MS:  No joint pain or swelling.  No decreased range of motion.  No back pain.  Psych:  No change in mood or affect. No depression or anxiety.  No memory loss.         Objective:   Physical Exam GEN: A/Ox3; pleasant , NAD, overweight   HEENT:  [place]/AT,  EACs-clear, TMs-wnl, NOSE-clear, THROAT-clear, no lesions, no postnasal drip or exudate noted.   NECK:  Supple w/ fair ROM; no JVD; normal carotid impulses w/o bruits; no thyromegaly or nodules palpated; no lymphadenopathy.  RESP  Decreased BS w/ no wheezing  no accessory muscle use, no dullness to percussion  CARD:  RRR, no m/r/g  , no peripheral edema, pulses intact, no cyanosis or clubbing.  GI:   BS+ , abd binder in place, no  organomegaly or masses detected.  Musco: Warm bil, no deformities or joint swelling noted.   Neuro: alert, no focal deficits noted.    Skin: Warm, no lesions or rashes         Assessment & Plan:

## 2014-01-13 NOTE — Patient Instructions (Signed)
Prednisone taper as directed.  Mucinex DM twice daily as needed for cough and congestion. May use albuterol nebulizer every 4-6 hours as needed for wheezing Follow Dr. Joya Gaskins in next month as planned and as needed Please contact office for sooner follow up if symptoms do not improve or worsen or seek emergency care

## 2014-01-13 NOTE — Assessment & Plan Note (Signed)
Recurrent Flare  Hold   Plan  Prednisone taper as directed.  Mucinex DM twice daily as needed for cough and congestion. May use albuterol nebulizer every 4-6 hours as needed for wheezing Follow Dr. Joya Gaskins in next month as planned and as needed Please contact office for sooner follow up if symptoms do not improve or worsen or seek emergency care

## 2014-02-18 ENCOUNTER — Ambulatory Visit (INDEPENDENT_AMBULATORY_CARE_PROVIDER_SITE_OTHER): Payer: Medicare HMO | Admitting: Internal Medicine

## 2014-02-18 ENCOUNTER — Encounter: Payer: Self-pay | Admitting: Internal Medicine

## 2014-02-18 ENCOUNTER — Other Ambulatory Visit (INDEPENDENT_AMBULATORY_CARE_PROVIDER_SITE_OTHER): Payer: Medicare HMO

## 2014-02-18 VITALS — BP 150/74 | HR 106 | Temp 97.4°F | Resp 18 | Ht 69.0 in | Wt 168.0 lb

## 2014-02-18 DIAGNOSIS — E785 Hyperlipidemia, unspecified: Secondary | ICD-10-CM

## 2014-02-18 DIAGNOSIS — E119 Type 2 diabetes mellitus without complications: Secondary | ICD-10-CM

## 2014-02-18 DIAGNOSIS — R413 Other amnesia: Secondary | ICD-10-CM

## 2014-02-18 DIAGNOSIS — I10 Essential (primary) hypertension: Secondary | ICD-10-CM

## 2014-02-18 DIAGNOSIS — J441 Chronic obstructive pulmonary disease with (acute) exacerbation: Secondary | ICD-10-CM

## 2014-02-18 LAB — VITAMIN B12: VITAMIN B 12: 171 pg/mL — AB (ref 211–911)

## 2014-02-18 LAB — FOLATE: Folate: 18.4 ng/mL (ref >5.9)

## 2014-02-18 LAB — TSH: TSH: 1.9 u[IU]/mL (ref 0.35–4.50)

## 2014-02-18 MED ORDER — SERTRALINE HCL 25 MG PO TABS: 25.0000 mg | ORAL_TABLET | Freq: Every day | ORAL | Status: DC

## 2014-02-18 MED ORDER — LEVOCETIRIZINE DIHYDROCHLORIDE 5 MG PO TABS: 5.0000 mg | ORAL_TABLET | Freq: Every evening | ORAL | Status: DC

## 2014-02-18 NOTE — Patient Instructions (Signed)
We will have you stop taking the mucinex and the other allergy medicine and the flonase. We will send in a medicine called xyzal which is a strong allergy medicine. If you do not notice a benefit in 2 weeks you can stop taking it.   We will check the blood work today for the physical so we can talk about it when you come back.

## 2014-02-18 NOTE — Progress Notes (Signed)
Pre visit review using our clinic review tool, if applicable. No additional management support is needed unless otherwise documented below in the visit note. 

## 2014-02-19 ENCOUNTER — Telehealth: Payer: Self-pay | Admitting: Internal Medicine

## 2014-02-19 LAB — COMPREHENSIVE METABOLIC PANEL
ALT: 18 U/L (ref 0–53)
AST: 16 U/L (ref 0–37)
Albumin: 4.1 g/dL (ref 3.5–5.2)
Alkaline Phosphatase: 55 U/L (ref 39–117)
BUN: 19 mg/dL (ref 6–23)
CO2: 24 meq/L (ref 19–32)
CREATININE: 1.06 mg/dL (ref 0.40–1.50)
Calcium: 10.5 mg/dL (ref 8.4–10.5)
Chloride: 101 mEq/L (ref 96–112)
GFR: 71.05 mL/min (ref >60.00)
GLUCOSE: 121 mg/dL — AB (ref 70–99)
Potassium: 3.7 mEq/L (ref 3.5–5.1)
Sodium: 142 mEq/L (ref 135–145)
TOTAL PROTEIN: 7 g/dL (ref 6.0–8.3)
Total Bilirubin: 0.4 mg/dL (ref 0.2–1.2)

## 2014-02-19 LAB — LIPID PANEL
Cholesterol: 175 mg/dL (ref 0–200)
HDL: 40.5 mg/dL (ref >39.00)
NonHDL: 134.5
TRIGLYCERIDES: 266 mg/dL — AB (ref 0.0–149.0)
Total CHOL/HDL Ratio: 4
VLDL: 53.2 mg/dL — ABNORMAL HIGH (ref 0.0–40.0)

## 2014-02-19 LAB — LDL CHOLESTEROL, DIRECT: Direct LDL: 106 mg/dL

## 2014-02-19 NOTE — Telephone Encounter (Signed)
Chad [name] is returning your call that she missed regarding   sertraline (ZOLOFT) 25 MG tablet [[id number]]

## 2014-02-20 DIAGNOSIS — R413 Other amnesia: Secondary | ICD-10-CM | POA: Insufficient documentation

## 2014-02-20 NOTE — Assessment & Plan Note (Addendum)
Not due for HgA1c, due to other complaints foot exam not done today but patient denies complaints. Is well controlled for now. Taking glipizide and metformin.

## 2014-02-20 NOTE — Assessment & Plan Note (Signed)
Not on statin, checking lipid panel. May need to start therapy if indicated.

## 2014-02-20 NOTE — Assessment & Plan Note (Signed)
Has had many exacerbations, not in current flare, uses albuterol prn and taking chlorpheniramine but doesn't notice much change, foradil, xyzal, spiriva. Will try flonase instead of the chlorpheniramine since it is not helping.

## 2014-02-20 NOTE — Progress Notes (Signed)
   Subjective:    Patient ID: Chad [name], male    DOB: April 08, 1929, 81 y.o.   MRN: [id number]  HPI The patient is an 81 YO man who is new here today. His main complaint is his memory. His wife is with him and helps to provide history. He has PMH of esophagus removed, fatty liver disease, COPD gold D (stable on inhalers), HTN (stable on losartan, clonidine, norvasc), type 2 diabetes (stable on metformin and glipizide). He also has been having some more problems with his memory and was recently started on donepezil. He and his wife have noticed these changes for some time but were unable to be evaluated for it in the past. Perhaps the last several years. They have not noticed any change since starting the zoloft and donepezil although they know that it is not supposed to make it any better. He denies associated guilt, depression, sleeping problems.   Review of Systems  Constitutional: Negative for fever, activity change, appetite change, fatigue and unexpected weight change.  HENT: Negative.   Respiratory: Negative for cough, chest tightness, shortness of breath and wheezing.   Cardiovascular: Negative for chest pain, palpitations and leg swelling.  Gastrointestinal: Negative for abdominal pain, diarrhea, constipation and abdominal distention.  Musculoskeletal: Negative.   Skin: Negative.   Allergic/Immunologic: Negative.   Neurological: Negative for dizziness, syncope, speech difficulty, weakness, light-headedness, numbness and headaches.       Memory changes  Psychiatric/Behavioral: Negative for confusion, dysphoric mood and decreased concentration.      Objective:   Physical Exam  Constitutional: He is oriented to person, place, and time. He appears well-developed and well-nourished.  HENT:  Head: Normocephalic and atraumatic.  Eyes: EOM are normal.  Neck: Normal range of motion.  Cardiovascular: Normal rate and regular rhythm.   Pulmonary/Chest: Effort normal. No respiratory  distress. He has no wheezes. He has no rales.  Abdominal: Soft. Bowel sounds are normal. He exhibits no distension. There is no tenderness.  Musculoskeletal: He exhibits no edema.  Neurological: He is alert and oriented to person, place, and time. Coordination normal.  Recent memory recall intake (3/3)   Skin: Skin is warm and dry.   Filed Vitals:   02/18/14 1459  BP: 150/74  Pulse: 106  Temp: 97.4 F (36.3 C)  TempSrc: Oral  Resp: 18  Height: 5\' 9"  (1.753 m)  Weight: 168 lb (76.204 kg)  SpO2: 94%      Assessment & Plan:

## 2014-02-20 NOTE — Assessment & Plan Note (Signed)
BP mildly high and has been better in the past. Continue taking clonidine, norvasc, losartan. Check CMP to make sure no kidney changes

## 2014-02-20 NOTE — Assessment & Plan Note (Signed)
Unclear what testing was done prior to starting zoloft and donepezil. Checking TSH, B12, folate today. Will do more extensive testing at next visit.

## 2014-02-20 NOTE — Telephone Encounter (Signed)
Spoke with patient's wife and answered all her questions.

## 2014-02-21 ENCOUNTER — Ambulatory Visit (INDEPENDENT_AMBULATORY_CARE_PROVIDER_SITE_OTHER): Payer: Medicare HMO | Admitting: Pulmonary Disease

## 2014-02-21 ENCOUNTER — Encounter: Payer: Self-pay | Admitting: Pulmonary Disease

## 2014-02-21 VITALS — BP 112/64 | HR 95 | Temp 97.0°F | Ht 69.0 in | Wt 171.0 lb

## 2014-02-21 DIAGNOSIS — J441 Chronic obstructive pulmonary disease with (acute) exacerbation: Secondary | ICD-10-CM

## 2014-02-21 MED ORDER — PREDNISONE 10 MG PO TABS: ORAL_TABLET | ORAL | Status: DC

## 2014-02-21 NOTE — Assessment & Plan Note (Signed)
The patient appears to be having an acute exacerbation of his known severe COPD. I will treat him with a course of prednisone, and also asked him to work on pulmonary hygiene. Will hold antibiotics for now since he is not bringing up purulent material, but will have a low threshold to add this if he begins to have a change in his mucus color.

## 2014-02-21 NOTE — Progress Notes (Signed)
   Subjective:    Patient ID: Chad [name], male    DOB: Nov 26, 1929, 81 y.o.   MRN: [id number]  HPI The patient comes in today for an acute sick visit. He has known gold D COPD, and has had increasing shortness of breath and wheezing starting this week. He has had very little mucus production, and what he is bringing up is clear. His family member was concerned that a weekend was getting ready to start, and wanted him evaluated before this. The patient has known recurrent episodes of COPD exacerbation.   Review of Systems  Constitutional: Negative for fever and unexpected weight change.  HENT: Negative for congestion, dental problem, ear pain, nosebleeds, postnasal drip, rhinorrhea, sinus pressure, sneezing, sore throat and trouble swallowing.   Eyes: Negative.  Negative for redness and itching.  Respiratory: Positive for cough, shortness of breath and wheezing. Negative for chest tightness.   Cardiovascular: Negative.  Negative for palpitations and leg swelling.  Gastrointestinal: Negative.  Negative for nausea and vomiting.  Endocrine: Negative.   Genitourinary: Negative for dysuria.  Musculoskeletal: Negative.  Negative for joint swelling.  Skin: Negative.  Negative for rash.  Allergic/Immunologic: Negative.   Neurological: Positive for weakness and light-headedness. Negative for dizziness, facial asymmetry and headaches.  Hematological: Negative.  Does not bruise/bleed easily.  Psychiatric/Behavioral: Negative for dysphoric mood. The patient is not nervous/anxious.        Objective:   Physical Exam Well-developed male in no acute distress Nose without purulence or discharge noted Neck without lymphadenopathy or thyromegaly Chest with decreased breath sounds, mild scattered wheezing, and prominent upper airway pseudo wheezing. Cardiac exam with regular rate and rhythm, 2/6 systolic murmur. Lower extremities with mild ankle edema, no cyanosis Alert and oriented, moves all 4  extremities.       Assessment & Plan:

## 2014-02-21 NOTE — Patient Instructions (Signed)
Prednisone taper over 8 days Can try mucinex dm one am and pm with large glass of water until better. Can use albuterol nebs as needed until better.  Let us know if you begin to cough up discolored mucus, and we can call in an antibiotic. Keep followup with Dr. Joya Gaskins.

## 2014-02-24 ENCOUNTER — Encounter: Payer: Self-pay | Admitting: Internal Medicine

## 2014-02-24 ENCOUNTER — Other Ambulatory Visit: Payer: Self-pay | Admitting: Internal Medicine

## 2014-02-24 ENCOUNTER — Ambulatory Visit (INDEPENDENT_AMBULATORY_CARE_PROVIDER_SITE_OTHER): Payer: Medicare HMO | Admitting: Internal Medicine

## 2014-02-24 VITALS — BP 150/82 | HR 93 | Temp 98.0°F | Resp 18 | Wt 170.8 lb

## 2014-02-24 DIAGNOSIS — I6529 Occlusion and stenosis of unspecified carotid artery: Secondary | ICD-10-CM

## 2014-02-24 MED ORDER — CYANOCOBALAMIN 1000 MCG PO TABS: 1000.0000 ug | ORAL_TABLET | Freq: Every day | ORAL | Status: DC

## 2014-02-24 NOTE — Progress Notes (Signed)
Pre visit review using our clinic review tool, if applicable. No additional management support is needed unless otherwise documented below in the visit note. 

## 2014-02-24 NOTE — Assessment & Plan Note (Signed)
Unclear if this truly was a TIA but since no recent carotid doppler will order. They think he had some blockages which were stable then. His last one was on a mobile unit but they cannot remember what year. He is on 81 mg ASA and will not change for now.

## 2014-02-24 NOTE — Progress Notes (Signed)
   Subjective:    Patient ID: Chad [name], male    DOB: 1929-09-25, 81 y.o.   MRN: [id number]  HPI The patient is an 81 YO man who is coming in for an episode where his legs locked up. It happened Thursday night and they called about it on Friday to schedule this appointment. His wife and himself were walking into a restaurant and his legs locked for about 15 seconds. They did not buckle and he did not experience any other associated symptoms. He had felt light headed before leaving to go to dinner and it is unclear if he had eaten much earlier. He felt completely normal after the episode and has not experienced any similar since that time. He denies fevers, chills, weakness, numbness, speech changes. They did not seek emergency help or care at the time of the event. They state that he had a similar episode back in the 80s-90s and was told it could have been a [place]. They also mention that he has not had the arteries in his neck checked in many years and that the last time they hadn't changed but it is unclear if they had blockages then.   Review of Systems  Constitutional: Negative for fever, activity change, appetite change, fatigue and unexpected weight change.  HENT: Negative.   Respiratory: Negative for cough, chest tightness, shortness of breath and wheezing.   Cardiovascular: Negative for chest pain, palpitations and leg swelling.  Gastrointestinal: Negative for abdominal pain, diarrhea, constipation and abdominal distention.  Musculoskeletal: Negative.   Skin: Negative.   Allergic/Immunologic: Negative.   Neurological: Positive for weakness. Negative for dizziness, syncope, speech difficulty, light-headedness, numbness and headaches.       Episode of leg locking and not moving, lasted 20 seconds  Psychiatric/Behavioral: Negative for confusion, dysphoric mood and decreased concentration.      Objective:   Physical Exam  Constitutional: He appears well-developed and well-nourished.    HENT:  Head: Normocephalic and atraumatic.  Eyes: EOM are normal.  Neck: Normal range of motion.  Cardiovascular: Normal rate and regular rhythm.   Carotids without bruits  Pulmonary/Chest: Effort normal. No respiratory distress. He has no wheezes. He has no rales.  Abdominal: Soft. Bowel sounds are normal. He exhibits no distension. There is no tenderness.  Musculoskeletal: He exhibits no edema.  Neurological: He is alert. Coordination normal.  Skin: Skin is warm and dry.   Filed Vitals:   02/24/14 0841  BP: 150/82  Pulse: 93  Temp: 98 F (36.7 C)  TempSrc: Oral  Resp: 18  Weight: 170 lb 12.8 oz (77.474 kg)  SpO2: 98%      Assessment & Plan:

## 2014-02-24 NOTE — Patient Instructions (Signed)
We will check on the arteries in the neck with ultrasound to make sure they are not more blocked than several years ago.  If you have more symptoms that last longer than 20-30 minutes you need to seek help at an emergency room as this could be a stroke and there are things that they can do to help if you come in soon enough.  Come back as scheduled and we will call you back about the results.

## 2014-02-25 ENCOUNTER — Telehealth: Payer: Self-pay | Admitting: Internal Medicine

## 2014-02-25 NOTE — Telephone Encounter (Signed)
Spoke with patient about lab results.

## 2014-02-25 NOTE — Telephone Encounter (Signed)
Chad [name] returning your call on lab results.

## 2014-02-26 ENCOUNTER — Telehealth: Payer: Self-pay | Admitting: *Deleted

## 2014-02-26 MED ORDER — VITAMIN B-12 1000 MCG PO TABS: 1000.0000 ug | ORAL_TABLET | Freq: Every day | ORAL | Status: DC

## 2014-02-26 NOTE — Telephone Encounter (Signed)
Left msg on triage stating spoke with Ami on yesterday was told that md sent rx in for vitamin b12. But pharmacy didn't have. Called wife back no answer LMOM md sent rx to [facility] will resend, but she can also purchase otc...Chad [name]

## 2014-02-28 ENCOUNTER — Other Ambulatory Visit: Payer: Self-pay | Admitting: Internal Medicine

## 2014-02-28 DIAGNOSIS — I6529 Occlusion and stenosis of unspecified carotid artery: Secondary | ICD-10-CM

## 2014-03-05 ENCOUNTER — Ambulatory Visit (HOSPITAL_COMMUNITY): Payer: Medicare HMO | Attending: Cardiology | Admitting: Cardiology

## 2014-03-05 DIAGNOSIS — I6529 Occlusion and stenosis of unspecified carotid artery: Secondary | ICD-10-CM | POA: Insufficient documentation

## 2014-03-05 NOTE — Progress Notes (Signed)
Carotid duplex performed 

## 2014-03-12 ENCOUNTER — Ambulatory Visit (INDEPENDENT_AMBULATORY_CARE_PROVIDER_SITE_OTHER): Payer: Medicare HMO | Admitting: Critical Care Medicine

## 2014-03-12 ENCOUNTER — Encounter: Payer: Self-pay | Admitting: Critical Care Medicine

## 2014-03-12 VITALS — BP 122/60 | HR 108 | Temp 98.1°F | Ht 69.0 in | Wt 171.0 lb

## 2014-03-12 DIAGNOSIS — J441 Chronic obstructive pulmonary disease with (acute) exacerbation: Secondary | ICD-10-CM

## 2014-03-12 DIAGNOSIS — K222 Esophageal obstruction: Secondary | ICD-10-CM

## 2014-03-12 NOTE — Patient Instructions (Signed)
No change in medications. Return in   3 months 

## 2014-03-12 NOTE — Progress Notes (Signed)
Subjective:    Patient ID: Chad [name], male    DOB: Aug 26, 1929, 81 y.o.   MRN: [id number]  HPI  81 yo male with COPD primary emphysematous component.  Prior esophagectomy 2007 and abn CXR since that surgery.   12/2013 Acute OV  Complains of 1 week of cough , clear mucus, wheezing and shortness of breath. Using mucinex .  No fever.  He denies any hemoptysis, chest pain, orthopnea, PND or leg swelling, diarrhea. Wheezing and cough are getting worse.  Appetite is good with no nausea, vomiting.  02/21/2014 Saw [place] for exac.    03/12/2014 Chief Complaint  Patient presents with  . COPD    Breathing is doing well. Reports runny nose. Denies chest tightness, SOB, coughing or wheezing.  Pt saw [place] [facility] for exac and rx pred and helped.  [facility]   Pt denies any significant sore throat, nasal congestion or excess secretions, fever, chills, sweats, unintended weight loss, pleurtic or exertional chest pain, orthopnea PND, or leg swelling Pt denies any increase in rescue therapy over baseline, denies waking up needing it or having any early am or nocturnal exacerbations of coughing/wheezing/or dyspnea. Pt also denies any obvious fluctuation in symptoms with  weather or environmental change or other alleviating or aggravating factors       Review of Systems Constitutional:   No  weight loss, night sweats,  Fevers, chills, + fatigue, or  lassitude.  HEENT:   No headaches,  Difficulty swallowing,  Tooth/dental problems, or  Sore throat,                No sneezing, itching, ear ache,  +nasal congestion, post nasal drip,   CV:  No chest pain,  Orthopnea, PND, swelling in lower extremities, anasarca, dizziness, palpitations, syncope.   GI  No heartburn, indigestion, abdominal pain, nausea, vomiting, diarrhea, change in bowel habits, loss of appetite, bloody stools.   Resp:   No chest wall deformity  Skin: no rash or lesions.  GU: no dysuria, change in color of urine,  no urgency or frequency.  No flank pain, no hematuria   MS:  No joint pain or swelling.  No decreased range of motion.  No back pain.  Psych:  No change in mood or affect. No depression or anxiety.  No memory loss.         Objective:   Physical Exam BP 122/60 mmHg  Pulse 108  Temp(Src) 98.1 F (36.7 C) (Oral)  Ht 5\' 9"  (1.753 m)  Wt 171 lb (77.565 kg)  BMI 25.24 kg/m2  SpO2 96%  GEN: A/Ox3; pleasant , NAD, overweight   HEENT:  [place]/AT,  EACs-clear, TMs-wnl, NOSE-clear, THROAT-clear, no lesions, no postnasal drip or exudate noted.   NECK:  Supple w/ fair ROM; no JVD; normal carotid impulses w/o bruits; no thyromegaly or nodules palpated; no lymphadenopathy.  RESP  Decreased BS w/ no wheezing  no accessory muscle use, no dullness to percussion  CARD:  RRR, no m/r/g  , no peripheral edema, pulses intact, no cyanosis or clubbing.  GI:   BS+ , abd binder in place, no organomegaly or masses detected.  Musco: Warm bil, no deformities or joint swelling noted.   Neuro: alert, no focal deficits noted.    Skin: Warm, no lesions or rashes         Assessment & Plan:   Obstructive chronic bronchitis with exacerbation, Copd Gold D Gold stage D COPD with frequent exacerbations stable at this  time Plan Maintain inhaled medications as prescribed Return 3 months    Updated Medication List Outpatient Encounter Prescriptions as of 03/12/2014  Medication Sig  . albuterol (PROAIR HFA) 108 (90 BASE) MCG/ACT inhaler Inhale 1-2 puffs into the lungs every 6 (six) hours as needed. For breathing  . albuterol (PROVENTIL) (2.5 MG/3ML) 0.083% nebulizer solution 1 vial in nebulizer every 4-6 hours as needed for wheezing/shortness of breath  . amLODipine (NORVASC) 10 MG tablet Take 1 tablet (10 mg total) by mouth daily.  Marland Kitchen aspirin 81 MG tablet Take 81 mg by mouth daily.    . brimonidine (ALPHAGAN P) 0.1 % SOLN Place 1 drop into the left eye 2 (two) times daily.    . Cholecalciferol (VITAMIN  D) 1000 UNITS capsule Take 1,000 Units by mouth daily.    . cloNIDine (CATAPRES) 0.2 MG tablet 1 tab by mouth as needed when BP is over 140   . diazepam (VALIUM) 5 MG tablet Take 2.5 mg by mouth daily as needed. For anxiety  . donepezil (ARICEPT) 5 MG tablet Take 5 mg by mouth at bedtime.   . formoterol (FORADIL AEROLIZER) 12 MCG capsule for inhaler Place 12 mcg into inhaler and inhale 2 (two) times daily.  Marland Kitchen glipiZIDE (GLUCOTROL XL) 2.5 MG 24 hr tablet Take 2.5 mg by mouth daily.  Marland Kitchen levocetirizine (XYZAL) 5 MG tablet Take 1 tablet (5 mg total) by mouth every evening.  Marland Kitchen losartan (COZAAR) 100 MG tablet Take 100 mg by mouth daily.    . metFORMIN (GLUCOPHAGE) 500 MG tablet Take 1,000 mg by mouth 2 (two) times daily with a meal.    . omeprazole (PRILOSEC) 20 MG capsule Take 40 mg by mouth 2 (two) times daily.   . sertraline (ZOLOFT) 25 MG tablet Take 1 tablet (25 mg total) by mouth daily.  Marland Kitchen tiotropium (SPIRIVA) 18 MCG inhalation capsule Place 18 mcg into inhaler and inhale daily.    . valACYclovir (VALTREX) 500 MG tablet Take 1,000 mg by mouth every 8 (eight) hours as needed. For out breaks  . vitamin B-12 (CYANOCOBALAMIN) 1000 MCG tablet Take 1 tablet (1,000 mcg total) by mouth daily.  . [DISCONTINUED] predniSONE (DELTASONE) 10 MG tablet Take 4 tabs daily x 2 days, 3 tabs daily x 2 days, 2 tabs daily x 2 days, 1 tab daily x  2 days then stop

## 2014-03-13 NOTE — Assessment & Plan Note (Signed)
Gold stage D COPD with frequent exacerbations stable at this time Plan Maintain inhaled medications as prescribed Return 3 months

## 2014-04-14 ENCOUNTER — Ambulatory Visit (INDEPENDENT_AMBULATORY_CARE_PROVIDER_SITE_OTHER): Payer: Medicare HMO | Admitting: Adult Health

## 2014-04-14 ENCOUNTER — Encounter: Payer: Self-pay | Admitting: Adult Health

## 2014-04-14 VITALS — BP 124/64 | HR 90 | Temp 98.0°F | Ht 69.0 in | Wt 170.2 lb

## 2014-04-14 DIAGNOSIS — J441 Chronic obstructive pulmonary disease with (acute) exacerbation: Secondary | ICD-10-CM

## 2014-04-14 MED ORDER — CEFUROXIME AXETIL 500 MG PO TABS: 500.0000 mg | ORAL_TABLET | Freq: Two times a day (BID) | ORAL | Status: DC

## 2014-04-14 NOTE — Patient Instructions (Signed)
Begin Ceftin 500mg  Twice daily  For  7 days .  Mucinex DM twice daily as needed for cough and congestion. Follow Dr. Joya Gaskins in 2 month as planned as planned and as needed Please contact office for sooner follow up if symptoms do not improve or worsen or seek emergency care

## 2014-04-14 NOTE — Progress Notes (Signed)
   Subjective:    Patient ID: Chad [name], male    DOB: [birth year], 81 y.o.   MRN: [id number]  HPI  81 yo male with COPD primary emphysematous component.  Prior esophagectomy 2007 and abn CXR since that surgery.   04/14/2014 Acute OV  Complains of  prod cough w/ yellow mucus, head congestion w/ yellow mucus, low grade temp 100.9, some wheezing x4days.   He denies any hemoptysis, chest pain, orthopnea, PND or leg swelling, diarrhea. Appetite is good.  Taking cold meds without much help.     Review of Systems Constitutional:   No  weight loss, night sweats,  Fevers, chills, + fatigue, or  lassitude.  HEENT:   No headaches,  Difficulty swallowing,  Tooth/dental problems, or  Sore throat,                No sneezing, itching, ear ache,  +nasal congestion, post nasal drip,   CV:  No chest pain,  Orthopnea, PND, swelling in lower extremities, anasarca, dizziness, palpitations, syncope.   GI  No heartburn, indigestion, abdominal pain, nausea, vomiting, diarrhea, change in bowel habits, loss of appetite, bloody stools.   Resp:   No chest wall deformity  Skin: no rash or lesions.  GU: no dysuria, change in color of urine, no urgency or frequency.  No flank pain, no hematuria   MS:  No joint pain or swelling.  No decreased range of motion.  No back pain.  Psych:  No change in mood or affect. No depression or anxiety.  No memory loss.         Objective:   Physical Exam GEN: A/Ox3; pleasant , NAD, overweight   HEENT:  [place]/AT,  EACs-clear, TMs-wnl, NOSE-clear, THROAT-clear, no lesions, no postnasal drip or exudate noted.   NECK:  Supple w/ fair ROM; no JVD; normal carotid impulses w/o bruits; no thyromegaly or nodules palpated; no lymphadenopathy.  RESP  Decreased BS w/ no wheezing  no accessory muscle use, no dullness to percussion  CARD:  RRR, no m/r/g  , no peripheral edema, pulses intact, no cyanosis or clubbing.  GI:   BS+ , abd binder in place, no organomegaly or  masses detected.  Musco: Warm bil, no deformities or joint swelling noted.   Neuro: alert, no focal deficits noted.    Skin: Warm, no lesions or rashes         Assessment & Plan:

## 2014-04-14 NOTE — Assessment & Plan Note (Signed)
Recurrent flare   Plan  Begin Ceftin 500mg  Twice daily  For  7 days .  Mucinex DM twice daily as needed for cough and congestion. Follow Dr. Joya Gaskins in 2 month as planned as planned and as needed Please contact office for sooner follow up if symptoms do not improve or worsen or seek emergency care

## 2014-04-15 ENCOUNTER — Other Ambulatory Visit: Payer: Self-pay | Admitting: Internal Medicine

## 2014-04-16 ENCOUNTER — Other Ambulatory Visit: Payer: Self-pay | Admitting: Geriatric Medicine

## 2014-04-16 MED ORDER — LOSARTAN POTASSIUM 100 MG PO TABS: 100.0000 mg | ORAL_TABLET | Freq: Every day | ORAL | Status: DC

## 2014-04-17 ENCOUNTER — Other Ambulatory Visit: Payer: Self-pay | Admitting: Geriatric Medicine

## 2014-04-17 MED ORDER — METFORMIN HCL 500 MG PO TABS: 1000.0000 mg | ORAL_TABLET | Freq: Two times a day (BID) | ORAL | Status: DC

## 2014-04-18 ENCOUNTER — Other Ambulatory Visit: Payer: Self-pay | Admitting: Internal Medicine

## 2014-04-24 ENCOUNTER — Ambulatory Visit (INDEPENDENT_AMBULATORY_CARE_PROVIDER_SITE_OTHER): Payer: Medicare HMO | Admitting: Internal Medicine

## 2014-04-24 ENCOUNTER — Encounter: Payer: Self-pay | Admitting: Internal Medicine

## 2014-04-24 VITALS — BP 130/58 | HR 107 | Temp 98.6°F | Resp 16 | Ht 69.0 in | Wt 173.8 lb

## 2014-04-24 DIAGNOSIS — E119 Type 2 diabetes mellitus without complications: Secondary | ICD-10-CM

## 2014-04-24 DIAGNOSIS — Z0000 Encounter for general adult medical examination without abnormal findings: Secondary | ICD-10-CM | POA: Diagnosis not present

## 2014-04-24 DIAGNOSIS — R413 Other amnesia: Secondary | ICD-10-CM

## 2014-04-24 MED ORDER — METFORMIN HCL 500 MG PO TABS: 500.0000 mg | ORAL_TABLET | Freq: Two times a day (BID) | ORAL | Status: DC

## 2014-04-24 MED ORDER — AMLODIPINE BESYLATE 5 MG PO TABS: 5.0000 mg | ORAL_TABLET | Freq: Every day | ORAL | Status: DC

## 2014-04-24 NOTE — Assessment & Plan Note (Signed)
Would not be considered a candidate for further colon cancer screening. Flu shot done this season, tetanus up to date, shingles and pneumonia series completed. Information given to him about the screening schedule.

## 2014-04-24 NOTE — Assessment & Plan Note (Signed)
Decrease metformin to 500 mg bid from 1000 mg bid to help decrease stools. If still having problems can stop. Could also be caused by the aricept. Will also continue the low dose sulfonylurea. Check HgA1c 3 months after med change. Last HgA1c 6.5 and at goal.

## 2014-04-24 NOTE — Progress Notes (Signed)
   Subjective:    Patient ID: Chad [name], male    DOB: 08/16/1929, 81 y.o.   MRN: [id number]  HPI Here for medicare wellness, having some more loose stools since starting aricept. Also having some lightheadedness with standing. Only occasionally and usually clear in <1 minute. No falls or syncope as a result.   Diet: mediocre Physical activity: sedentary Depression/mood screen: negative Hearing: intact to whispered voice Visual acuity: grossly normal ADLs: capable Fall risk: minimum Home safety: good Cognitive evaluation: intact to orientation, poor recall EOL planning: adv directives discussed they have talked about before  I have personally reviewed and have noted 1. The patient's medical and social history - reviewed today no updated from prior 2. Their use of alcohol, tobacco or illicit drugs 3. Their current medications and supplements 4. The patient's functional ability including ADL's, fall risks, home safety risks and hearing or visual impairment. 5. Diet and physical activities 6. Evidence for depression or mood disorders 7. Care team reviewed and updated (available in snapshot)  Review of Systems  Constitutional: Negative.   Gastrointestinal: Positive for diarrhea. Negative for nausea, abdominal pain, constipation and abdominal distention.  Musculoskeletal: Negative for myalgias, back pain and arthralgias.  Skin: Negative.   Neurological: Positive for light-headedness. Negative for dizziness, weakness and headaches.       Memory changes  Psychiatric/Behavioral: Negative.       Objective:   Physical Exam  Constitutional: He appears well-developed and well-nourished.  HENT:  Head: Normocephalic and atraumatic.  Eyes: EOM are normal.  Neck: Normal range of motion.  Cardiovascular: Normal rate.   Pulmonary/Chest: Effort normal and breath sounds normal.  Mild expiratory wheeze disappears with forced cough  Abdominal: Soft. Bowel sounds are normal. He exhibits  no distension. There is no tenderness. There is no rebound.  Neurological: Coordination normal.  Skin: Skin is warm and dry.   Filed Vitals:   04/24/14 0928 04/24/14 0955 04/24/14 0956  BP: 116/64 140/60 130/58  Pulse: 87 94 107  Temp: 98.6 F (37 C)    TempSrc: Oral    Resp: 16    Height: 5\' 9"  (1.753 m)    Weight: 173 lb 12.8 oz (78.835 kg)    SpO2: 95%          Sitting    Standing    Assessment & Plan:

## 2014-04-24 NOTE — Progress Notes (Signed)
Pre visit review using our clinic review tool, if applicable. No additional management support is needed unless otherwise documented below in the visit note. 

## 2014-04-24 NOTE — Assessment & Plan Note (Signed)
Fairly stable on the aricept. Has never had MRI so will order today.

## 2014-04-24 NOTE — Patient Instructions (Signed)
We will have you take 1/2 pill (total 5 mg since 10 mg pills) of the amlodipine to help with the leg swelling. We will send in a new prescription for the 5 mg pills so when you get that you can take 1 pill a day.   Decrease the metformin to 1 pill twice a day (before was taking 2 pills twice a day). We will check the labs in about 3 months.   We will order the MRI of his brain to make sure we are not missing anything.

## 2014-05-12 ENCOUNTER — Encounter: Payer: Self-pay | Admitting: Pulmonary Disease

## 2014-05-12 ENCOUNTER — Ambulatory Visit (INDEPENDENT_AMBULATORY_CARE_PROVIDER_SITE_OTHER): Payer: Medicare HMO | Admitting: Pulmonary Disease

## 2014-05-12 VITALS — BP 136/68 | HR 73 | Temp 97.9°F | Ht 69.0 in | Wt 175.0 lb

## 2014-05-12 DIAGNOSIS — J440 Chronic obstructive pulmonary disease with acute lower respiratory infection: Secondary | ICD-10-CM

## 2014-05-12 MED ORDER — CEFUROXIME AXETIL 500 MG PO TABS: 500.0000 mg | ORAL_TABLET | Freq: Two times a day (BID) | ORAL | Status: DC

## 2014-05-12 NOTE — Patient Instructions (Signed)
Will treat with another round of ceftin 500mg  one am and pm for 7 days Can take mucinex to help with mucus clearance Will hold off on prednisone for now, but let us know if you are not getting better.  Keep followup apptm with Dr. Joya Gaskins.

## 2014-05-12 NOTE — Assessment & Plan Note (Signed)
The patient is having purulent mucus with chest congestion and increased cough, but doesn't feel that his breathing has overly worsened. I will treat him with a course of an antibiotic, and his family member states he did well with the Ceftin from the last visit in March. Will hold off on prednisone for now, but they are to call if he does not improve then we can send in a prescription for him.

## 2014-05-12 NOTE — Progress Notes (Signed)
   Subjective:    Patient ID: Chad [name], male    DOB: 09/20/1929, 81 y.o.   MRN: [id number]  HPI The patient comes in today for an acute sick visit. He is followed in our office for COPD with recurrent episodes of bronchitis. He comes in today with a one-week history of increasing chest congestion, cough with purulent mucus, but no real significant increase in his shortness of breath. He was just on antibiotics last month. He denies any fevers, chills, or sweats.   Review of Systems  Constitutional: Negative for fever and unexpected weight change.  HENT: Positive for congestion. Negative for dental problem, ear pain, nosebleeds, postnasal drip, rhinorrhea, sinus pressure, sneezing, sore throat and trouble swallowing.   Eyes: Negative for redness and itching.  Respiratory: Positive for cough, shortness of breath and wheezing. Negative for chest tightness.   Cardiovascular: Positive for leg swelling. Negative for palpitations.  Gastrointestinal: Negative for nausea and vomiting.  Genitourinary: Negative for dysuria.  Musculoskeletal: Negative for joint swelling.  Skin: Negative for rash.  Neurological: Negative for headaches.  Hematological: Does not bruise/bleed easily.  Psychiatric/Behavioral: Negative for dysphoric mood. The patient is not nervous/anxious.        Objective:   Physical Exam Well-developed male in no acute distress Nose without purulence or discharge noted Neck without lymphadenopathy or thyromegaly Oropharynx clear Chest with adequate airflow, no definite lower airway wheezes, mild upper airway wheezing. Cardiac exam with regular rate and rhythm, 1/6 systolic murmur Lower extremities with minimal edema, no cyanosis Alert, moves all 4 extremities.       Assessment & Plan:

## 2014-05-13 ENCOUNTER — Other Ambulatory Visit: Payer: Self-pay | Admitting: Internal Medicine

## 2014-05-13 DIAGNOSIS — R413 Other amnesia: Secondary | ICD-10-CM

## 2014-05-14 ENCOUNTER — Telehealth: Payer: Self-pay | Admitting: Pulmonary Disease

## 2014-05-14 MED ORDER — PREDNISONE 10 MG PO TABS: ORAL_TABLET | ORAL | Status: DC

## 2014-05-14 NOTE — Telephone Encounter (Signed)
Ok to send in 8 day prednisone taper

## 2014-05-14 NOTE — Telephone Encounter (Signed)
Spoke with pt's wife Patsy. States that since seeing [place] pt's wheezing has gotten worse. Feels like he needs prednisone sent in.  [facility] - please advise. Thanks.

## 2014-05-14 NOTE — Telephone Encounter (Signed)
Rx has been sent in per [facility]. Pt's wife is aware. Nothing further was needed.

## 2014-05-15 ENCOUNTER — Ambulatory Visit (INDEPENDENT_AMBULATORY_CARE_PROVIDER_SITE_OTHER)
Admission: RE | Admit: 2014-05-15 | Discharge: 2014-05-15 | Disposition: A | Discharge status: Home / Self Care | Payer: Medicare HMO | Source: Ambulatory Visit | Attending: Internal Medicine | Admitting: Internal Medicine

## 2014-05-15 DIAGNOSIS — R413 Other amnesia: Secondary | ICD-10-CM

## 2014-05-17 ENCOUNTER — Other Ambulatory Visit: Payer: Self-pay | Admitting: Internal Medicine

## 2014-05-18 ENCOUNTER — Other Ambulatory Visit: Payer: Self-pay | Admitting: Internal Medicine

## 2014-05-30 ENCOUNTER — Ambulatory Visit (INDEPENDENT_AMBULATORY_CARE_PROVIDER_SITE_OTHER): Payer: Medicare HMO | Admitting: Adult Health

## 2014-05-30 ENCOUNTER — Encounter: Payer: Self-pay | Admitting: Adult Health

## 2014-05-30 VITALS — BP 124/70 | HR 76 | Temp 98.2°F | Ht 69.0 in | Wt 175.0 lb

## 2014-05-30 DIAGNOSIS — J441 Chronic obstructive pulmonary disease with (acute) exacerbation: Secondary | ICD-10-CM

## 2014-05-30 MED ORDER — PREDNISONE 10 MG PO TABS: ORAL_TABLET | ORAL | Status: DC

## 2014-05-30 NOTE — Assessment & Plan Note (Signed)
Exacerbation  Hold on abx at this time   Plan  Prednisone taper as directed.  Mucinex DM twice daily as needed for cough and congestion. Follow Dr. Joya Gaskins in next month as planned and as needed Please contact office for sooner follow up if symptoms do not improve or worsen or seek emergency care

## 2014-05-30 NOTE — Patient Instructions (Signed)
Prednisone taper as directed.  Mucinex DM twice daily as needed for cough and congestion. Follow Dr. Joya Gaskins in next month as planned and as needed Please contact office for sooner follow up if symptoms do not improve or worsen or seek emergency care

## 2014-05-30 NOTE — Progress Notes (Signed)
   Subjective:    Patient ID: Chad [name], male    DOB: August 04, 1929, 81 y.o.   MRN: [id number]  HPI  81 yo male with COPD primary emphysematous component.  Prior esophagectomy 2007 and abn CXR since that surgery.   05/30/2014 Acute OV  Complains of [place] pt here for wheezing, dry cough x1 week.  No fever, discolored mucus . He denies orthopnea, edema , n/v/d.  Appetite is good.  Last abx was 3 weeks ago. (2 abx x 2 months) .  Taking mucinex.       Review of Systems Constitutional:   No  weight loss, night sweats,  Fevers, chills, + fatigue, or  lassitude.  HEENT:   No headaches,  Difficulty swallowing,  Tooth/dental problems, or  Sore throat,                No sneezing, itching, ear ache, nasal congestion, post nasal drip,   CV:  No chest pain,  Orthopnea, PND, swelling in lower extremities, anasarca, dizziness, palpitations, syncope.   GI  No heartburn, indigestion, abdominal pain, nausea, vomiting, diarrhea, change in bowel habits, loss of appetite, bloody stools.   Resp:   No chest wall deformity  Skin: no rash or lesions.  GU: no dysuria, change in color of urine, no urgency or frequency.  No flank pain, no hematuria   MS:  No joint pain or swelling.  No decreased range of motion.  No back pain.  Psych:  No change in mood or affect. No depression or anxiety.  No memory loss.         Objective:   Physical Exam GEN: A/Ox3; pleasant , NAD, overweight   HEENT:  [place]/AT,  EACs-clear, TMs-wnl, NOSE-clear, THROAT-clear, no lesions, no postnasal drip or exudate noted.   NECK:  Supple w/ fair ROM; no JVD; normal carotid impulses w/o bruits; no thyromegaly or nodules palpated; no lymphadenopathy.  RESP  Decreased BS w/ exp wheezing no accessory muscle use, no dullness to percussion Speaks in full sentences  CARD:  RRR, no m/r/g  , no peripheral edema, pulses intact, no cyanosis or clubbing.  GI:   BS+ , abd binder in place, no organomegaly or masses detected.  Musco:  Warm bil, no deformities or joint swelling noted.   Neuro: alert, no focal deficits noted.    Skin: Warm, no lesions or rashes         Assessment & Plan:

## 2014-06-16 ENCOUNTER — Ambulatory Visit (INDEPENDENT_AMBULATORY_CARE_PROVIDER_SITE_OTHER): Payer: Medicare HMO | Admitting: Critical Care Medicine

## 2014-06-16 ENCOUNTER — Encounter: Payer: Self-pay | Admitting: Critical Care Medicine

## 2014-06-16 VITALS — BP 134/64 | HR 70 | Temp 97.5°F | Ht 69.0 in | Wt 177.6 lb

## 2014-06-16 DIAGNOSIS — J441 Chronic obstructive pulmonary disease with (acute) exacerbation: Secondary | ICD-10-CM

## 2014-06-16 NOTE — Patient Instructions (Signed)
No change in medications. Return in   3 months 

## 2014-06-16 NOTE — Progress Notes (Signed)
Subjective:    Patient ID: Chad [name], male    DOB: 29-Apr-1929, 81 y.o.   MRN: [id number]  HPI  06/16/2014 Chief Complaint  Patient presents with  . 2 wk follow up    Feeling better.  Wheezing and cough have resolved.  No SOB, chest tightness, or CP.    Pt was worse and pred rx.  No abx rx.  Not much cough, wheezing is better.  Not as dyspneic.  No chest pain.  No QHS dyspnea.   Overall much better    Pt denies any significant sore throat, nasal congestion or excess secretions, fever, chills, sweats, unintended weight loss, pleurtic or exertional chest pain, orthopnea PND, or leg swelling Pt denies any increase in rescue therapy over baseline, denies waking up needing it or having any early am or nocturnal exacerbations of coughing/wheezing/or dyspnea. Pt also denies any obvious fluctuation in symptoms with  weather or environmental change or other alleviating or aggravating factors   Current Medications, Allergies, Complete Past Medical History, Past Surgical History, Family History, and Social History were reviewed in [employer] record.  Past Medical History  Diagnosis Date  . Fatty liver disease, nonalcoholic   . Coronary artery stenosis   . Diverticulosis of colon   . Diaphragmatic hernia without mention of obstruction or gangrene   . Dyspnea   . Personal history of other malignant neoplasm of skin   . Barrett's esophagus   . TIA (transient ischemic attack) 1998 X "a few"  . Hyperlipidemia   . Acid reflux disease   . Glaucoma   . HTN (hypertension)   . RENAL CALCULUS, HX OF 08/28/2008  . CAROTID ARTERY STENOSIS, BILATERAL 08/27/2008  . Esophageal stenosis   . COPD (chronic obstructive pulmonary disease)     "severe" (12/20/2013)  . Chronic bronchitis     "most q yr; he's had it several times" (12/20/2013)  . Pneumonia 1990's X 4  . Type II diabetes mellitus   . History of blood transfusion 2007    "related to OR"  . History of hiatal  hernia     "repaired when esophagus removed"  . History of stomach ulcers   . Sinus headache     "seasonal"  . Arthritis     "all over"  . Kidney stones     "passed them all" (12/20/2013)  . Basal cell carcinoma of nose      Family History  Problem Relation Age of Onset  . Colon cancer Mother   . Lung cancer Mother   . Heart attack Mother   . Colon cancer Maternal Grandfather   . Heart attack Brother   . Heart attack Brother   . Stomach cancer Father   . Esophageal cancer Father      History   Social History  . Marital Status: Married    Spouse Name: N/A  . Number of Children: N/A  . Years of Education: N/A   Occupational History  . retired Automotive engineer    Social History Main Topics  . Smoking status: Former Smoker -- 1.00 packs/day for 40 years    Types: Cigarettes    Quit date: 01/24/1978  . Smokeless tobacco: Former Systems developer  . Alcohol Use: No  . Drug Use: No  . Sexual Activity: Not Currently   Other Topics Concern  . Not on file   Social History Narrative     Allergies  Allergen Reactions  . Moxifloxacin Anaphylaxis    Avelox REACTION:  anaphylaxis  . Penicillins     REACTION: hives  . Sulfonamide Derivatives     REACTION: hives  . Timolol Maleate     Causes severe chest congestion     Outpatient Prescriptions Prior to Visit  Medication Sig Dispense Refill  . albuterol (PROAIR HFA) 108 (90 BASE) MCG/ACT inhaler Inhale 1-2 puffs into the lungs every 6 (six) hours as needed. For breathing    . albuterol (PROVENTIL) (2.5 MG/3ML) 0.083% nebulizer solution 1 vial in nebulizer every 4-6 hours as needed for wheezing/shortness of breath 90 vial 11  . amLODipine (NORVASC) 5 MG tablet Take 1 tablet (5 mg total) by mouth daily. 30 tablet 6  . aspirin 81 MG tablet Take 81 mg by mouth daily.      . brimonidine (ALPHAGAN P) 0.1 % SOLN Place 1 drop into the left eye 2 (two) times daily.      . Cholecalciferol (VITAMIN D) 1000 UNITS capsule Take 1,000 Units  by mouth daily.      . cloNIDine (CATAPRES) 0.2 MG tablet 1 tab by mouth as needed when BP is over 140     . diazepam (VALIUM) 5 MG tablet Take 2.5 mg by mouth daily as needed (dizziness, inner ear).     Marland Kitchen donepezil (ARICEPT) 5 MG tablet TAKE 1 TABLET BY MOUTH DAILY 30 tablet 0  . formoterol (FORADIL AEROLIZER) 12 MCG capsule for inhaler Place 12 mcg into inhaler and inhale 2 (two) times daily.    Marland Kitchen glipiZIDE (GLUCOTROL XL) 2.5 MG 24 hr tablet TAKE 1 TABLET BY MOUTH EVERY DAY 90 tablet 0  . losartan (COZAAR) 100 MG tablet Take 1 tablet (100 mg total) by mouth daily. 90 tablet 3  . metFORMIN (GLUCOPHAGE) 500 MG tablet Take 1 tablet (500 mg total) by mouth 2 (two) times daily with a meal. 180 tablet 3  . omeprazole (PRILOSEC) 20 MG capsule Take 40 mg by mouth 2 (two) times daily.     . promethazine (PHENERGAN) 25 MG tablet Take 1/2 to 1 tablet as needed.    . sertraline (ZOLOFT) 25 MG tablet Take 1 tablet (25 mg total) by mouth daily. 30 tablet 3  . tiotropium (SPIRIVA) 18 MCG inhalation capsule Place 18 mcg into inhaler and inhale daily.      . valACYclovir (VALTREX) 500 MG tablet Take 1,000 mg by mouth every 8 (eight) hours as needed. For out breaks    . vitamin B-12 (CYANOCOBALAMIN) 1000 MCG tablet Take 1 tablet (1,000 mcg total) by mouth daily. (Patient taking differently: Take 2000 mg daily) 90 tablet 3  . levocetirizine (XYZAL) 5 MG tablet TAKE 1 TABLET BY MOUTH EVERY EVENING (Patient not taking: Reported on 06/16/2014) 90 tablet 0  . predniSONE (DELTASONE) 10 MG tablet 4 tabs for 2 days, then 3 tabs for 2 days, 2 tabs for 2 days, then 1 tab for 2 days, then stop (Patient not taking: Reported on 06/16/2014) 20 tablet 0   No facility-administered medications prior to visit.    Review of Systems  Constitutional: Negative for fever, chills, diaphoresis, appetite change, fatigue and unexpected weight change.  HENT: Positive for postnasal drip and rhinorrhea. Negative for congestion, ear  discharge, ear pain, hearing loss, nosebleeds, sinus pressure, sneezing, sore throat, trouble swallowing and voice change.   Eyes: Negative for discharge and itching.  Respiratory: Positive for shortness of breath. Negative for apnea, cough, choking, chest tightness, wheezing and stridor.   Cardiovascular: Negative for chest pain, palpitations and leg swelling.  Gastrointestinal: Negative for nausea, vomiting, abdominal pain and abdominal distention.  Endocrine: Negative.   Genitourinary: Negative.   Musculoskeletal: Negative for myalgias, joint swelling and arthralgias.  Skin: Negative for rash.  Allergic/Immunologic: Negative for environmental allergies.  Neurological: Negative for dizziness, syncope, weakness and headaches.  Hematological: Negative for adenopathy. Does not bruise/bleed easily.  Psychiatric/Behavioral: Negative for sleep disturbance and agitation. The patient is not nervous/anxious.        Objective:   Physical Exam  Constitutional: He is oriented to person, place, and time. He appears well-developed and well-nourished. He is active.  HENT:  Head: Normocephalic and atraumatic.  Nose: No mucosal edema, rhinorrhea, sinus tenderness, nasal deformity or septal deviation. No epistaxis. Right sinus exhibits no maxillary sinus tenderness and no frontal sinus tenderness. Left sinus exhibits no maxillary sinus tenderness and no frontal sinus tenderness.  Mouth/Throat: Oropharynx is clear and moist. No oropharyngeal exudate.  Eyes: Conjunctivae and EOM are normal. Pupils are equal, round, and reactive to light. No scleral icterus.  Neck: Trachea normal and normal range of motion. Neck supple. No JVD present. No tracheal tenderness and no muscular tenderness present. Carotid bruit is not present. No rigidity. No tracheal deviation, no edema, no erythema and normal range of motion present. No thyromegaly present.  Cardiovascular: Normal rate, regular rhythm, S1 normal, S2 normal,  normal heart sounds, intact distal pulses and normal pulses.  PMI is not displaced.  Exam reveals no gallop, no S3, no S4, no distant heart sounds and no friction rub.   No murmur heard.  No systolic murmur is present   No diastolic murmur is present  Pulmonary/Chest: No accessory muscle usage or stridor. No apnea and no tachypnea. No respiratory distress. He has decreased breath sounds in the right lower field and the left lower field. He has no wheezes. He has no rhonchi. He has no rales. Chest wall is not dull to percussion. He exhibits no mass, no tenderness, no bony tenderness and no deformity.  Abdominal: Soft. Normal appearance and bowel sounds are normal. He exhibits no distension and no ascites. There is no hepatosplenomegaly. There is no tenderness. There is no rigidity, no rebound and no guarding.  Musculoskeletal: Normal range of motion.  Lymphadenopathy:       Head (right side): No submental and no submandibular adenopathy present.       Head (left side): No submental and no submandibular adenopathy present.    He has no cervical adenopathy.  Neurological: He is alert and oriented to person, place, and time. He has normal strength. No sensory deficit.  Skin: Skin is warm and dry. No rash noted. He is not diaphoretic. No pallor. Nails show no clubbing.  Psychiatric: He has a normal mood and affect. His speech is normal and behavior is normal.  Vitals reviewed.         Assessment & Plan:  I personally reviewed all images and lab data in the [facility] system as well as any outside material available during this office visit and agree with the  radiology impressions.  I also have reviewed any data /notes/records if available in care everywhere.  Obstructive chronic bronchitis with exacerbation, Copd Gold D Gold stage D COPD with frequent exacerbations now improved after recent acute flare Plan Maintain inhaled medications as prescribed No additional corticosteroids indicated

## 2014-06-16 NOTE — Assessment & Plan Note (Signed)
Gold stage D COPD with frequent exacerbations now improved after recent acute flare Plan Maintain inhaled medications as prescribed No additional corticosteroids indicated

## 2014-06-17 ENCOUNTER — Other Ambulatory Visit: Payer: Self-pay | Admitting: Internal Medicine

## 2014-06-24 ENCOUNTER — Ambulatory Visit: Payer: Medicare HMO | Attending: Orthopaedic Surgery | Admitting: Physical Therapy

## 2014-06-24 DIAGNOSIS — M545 Low back pain, unspecified: Secondary | ICD-10-CM

## 2014-06-24 DIAGNOSIS — M25551 Pain in right hip: Secondary | ICD-10-CM | POA: Insufficient documentation

## 2014-06-24 NOTE — Therapy (Addendum)
[place], [place], [address] Phone: [phone]   Fax:  [phone]  Physical Therapy Evaluation  Patient Details  Name: Chad [name] MRN: [id number] Date of Birth: 08-21-1929 Referring Provider:  Garald Balding, MD  Encounter Date: 06/24/2014      PT End of Session - 06/24/14 1647    Visit Number 1   Number of Visits 6   Date for PT Re-Evaluation 08/05/14   Authorization Type Medicare   PT Start Time 1500   PT Stop Time 1545   PT Time Calculation (min) 45 min   Activity Tolerance Patient tolerated treatment well   Behavior During Therapy [facility] for tasks assessed/performed      Past Medical History  Diagnosis Date  . Fatty liver disease, nonalcoholic   . Coronary artery stenosis   . Diverticulosis of colon   . Diaphragmatic hernia without mention of obstruction or gangrene   . Dyspnea   . Personal history of other malignant neoplasm of skin   . Barrett's esophagus   . TIA (transient ischemic attack) 1998 X "a few"  . Hyperlipidemia   . Acid reflux disease   . Glaucoma   . HTN (hypertension)   . RENAL CALCULUS, HX OF 08/28/2008  . CAROTID ARTERY STENOSIS, BILATERAL 08/27/2008  . Esophageal stenosis   . COPD (chronic obstructive pulmonary disease)     "severe" (12/20/2013)  . Chronic bronchitis     "most q yr; he's had it several times" (12/20/2013)  . Pneumonia 1990's X 4  . Type II diabetes mellitus   . History of blood transfusion 2007    "related to OR"  . History of hiatal hernia     "repaired when esophagus removed"  . History of stomach ulcers   . Sinus headache     "seasonal"  . Arthritis     "all over"  . Kidney stones     "passed them all" (12/20/2013)  . Basal cell carcinoma of nose     Past Surgical History  Procedure Laterality Date  . Esophagectomy  2007    with stomach pull through  . Pars plana vitrectomy Bilateral 01/2007-05/2007  . Ventral hernia repair   02/2006  . Knee arthroscopy Bilateral 04/1989; 08/1996; 06/1999    "right; left; left"  . Carotid endarterectomy Bilateral 02/1996-03/1996  . Shoulder arthroscopy Right 04/1998  . Mohs surgery  ?2002    "tip of nose; basal cell"  . Nasal polyp excision  2005    "benign"  . Hernia repair    . Cataract extraction w/ intraocular lens implant Bilateral 04/2007 - 09/2007    "left-right"  . Glaucoma surgery Right 11/2008    "implanted drain tube"  . Esophagogastroduodenoscopy (egd) with esophageal dilation  10/2011  . Eye surgery    . Eye surgery Right 04/2006; 08/2007    "had gas bubbles put in"  . Eye surgery Right 01/2007    "air bubble"  . Eye surgery Left 12/12/2013    "cleaned his implant w/laser"    There were no vitals filed for this visit.  Visit Diagnosis:  Right-sided low back pain without sciatica - Plan: PT plan of care cert/re-cert  Right hip pain - Plan: PT plan of care cert/re-cert      Subjective Assessment - 06/24/14 1508    Subjective pt is a 81 y.o M with CC of low back pain that has been going on/off for about a month. He has a history  of R hip pain which they report may be coming from the back. Symptoms have gotten alittle better since onset.    Patient is accompained by: Family member   Pertinent History Large abdominal Hernia since 2007 (requires brace/sleeve)   How long can you sit comfortably? unlimited   How long can you stand comfortably? unlimited   How long can you walk comfortably? unlimited   Diagnostic tests 06/20/2014 x-ray spondylosis   Patient Stated Goals to get stronger    Currently in Pain? Yes   Pain Score 0-No pain   Pain Location Back   Aggravating Factors  pt reports no pain   Pain Relieving Factors pt reports no pain   Multiple Pain Sites Yes   Pain Score 5   Pain Location Hip   Pain Orientation Right   Pain Descriptors / Indicators Aching;Sore   Pain Type Chronic pain   Pain Onset More than a month ago   Pain Frequency Intermittent    Aggravating Factors  standing up   Pain Relieving Factors resting            [facility] PT Assessment - 06/24/14 1514    Assessment   Medical Diagnosis low back pain   Onset Date/Surgical Date --  1 month   Hand Dominance Right   Next MD Visit 1 month   Prior Therapy yes   for right shoulder   Precautions   Precautions None   Restrictions   Weight Bearing Restrictions No   Balance Screen   Has the patient fallen in the past 6 months No   Has the patient had a decrease in activity level because of a fear of falling?  No   Is the patient reluctant to leave their home because of a fear of falling?  No   Home Environment   Living Environment Private residence   Living Arrangements Spouse/significant other   Available Help at Discharge Available PRN/intermittently;Available 24 hours/day   Type of Home House   Home Access Stairs to enter   Entrance Stairs-Number of Steps 1   Entrance Stairs-Rails None   Home Layout One level   Prior Function   Level of Independence Independent;Independent with basic ADLs;Independent with household mobility without device;Independent with community mobility without device;Independent with gait;Independent with transfers;Independent with homemaking with ambulation   Vocation Retired   Leisure watching TV.   Cognition   Overall Cognitive Status Within Functional Limits for tasks assessed   Observation/Other Assessments   Focus on Therapeutic Outcomes (FOTO)  34% limited  Predicted 25% limited   ROM / Strength   AROM / PROM / Strength AROM;Strength   AROM   AROM Assessment Site Lumbar   Lumbar Flexion 75   Lumbar Extension 25   Lumbar - Right Side Bend 22   Lumbar - Left Side Bend 22   Lumbar - Right Rotation 50%   Lumbar - Left Rotation 50%   Strength   Strength Assessment Site Hip;Knee   Right/Left Hip Right;Left   Right Hip Flexion 4/5   Right Hip ABduction 5/5   Right Hip ADduction 5/5   Left Hip Flexion 4/5   Left Hip ABduction 5/5    Left Hip ADduction 5/5   Right/Left Knee Left;Right   Right Knee Flexion 4+/5   Right Knee Extension 4+/5   Left Knee Flexion 4/5   Left Knee Extension 4+/5   Palpation   Palpation comment tenderness located at the R greater trochanter and    Special Tests  Hip Special Tests  Hip Scouring   Slump test   Findings Negative   Prone Knee Bend Test   Findings Negative   Straight Leg Raise   Findings Negative   Hip Scouring   Findings Negative                           PT Education - 06/24/14 1647    Education provided Yes   Education Details evaluation findings, POC, goals, HEP, anatomical explanation   Person(s) Educated Patient   Methods Explanation   Comprehension Verbalized understanding          PT Short Term Goals - 06/24/14 1654    PT SHORT TERM GOAL #1   Title pt will be I with basic HEP (07/15/2014)   Time 3   Period Weeks   Status New   PT SHORT TERM GOAL #2   Title he will increase his FOTO score by >10 points to assist with increased funcitonal capacity (07/15/2014)   Time 3   Period Weeks   Status New           PT Long Term Goals - 06/24/14 1655    PT LONG TERM GOAL #1   Title pt will be I with advanced HEP upon discharge (08/05/2014)   Time 6   Status New   PT LONG TERM GOAL #2   Title pt will demonstrate <2/10 pain during sit<>stand transitions and stairs/ walking up/down >10 steps to assist with funcitonal mobility (08/05/2014)   Time 6   Period Weeks   Status New   PT LONG TERM GOAL #3   Title he will increase bil LE strength to > 4+/5 to assist with sit to stand transitions and endurance with weight bearing activities (08/05/2014)   Time 6   Period Weeks   Status New   PT LONG TERM GOAL #4   Title He will be able to verbalize and demonstrate techniques to reduce risk or low back/hip reinjury via postural awareness, lifting and carrying mechanics and HEP (08/05/2014)   Time 6   Period Weeks   Status New   PT LONG TERM  GOAL #5   Title pt will increase FOTO score ot > 75 to help with funcitonal capcacity upon discharge (08/05/2014)   Time 6   Period Weeks   Status New               Plan - 06/24/14 1648    Clinical Impression Statement Lucien present to [facility] with report of low back pain with referral to the R hip that has stayed the same since the last couple of weeks. Palpation revealed tenderness located at the greater trochanter pain along the lateral gluteal musuclature and piriformis tendon. Low back  presented with mild hypmobility of intervertebral  movement P/A with no pain noted during testing. all special tsting was negative.  He demonstrated mild weakness in bil LE with no pain during testing. per wife report he demonstrates the greatest diffiuclty getitng up from a seated position. He would benefit from skilled physical therapy to improve his funciton by addressing the impairments listed.    Pt will benefit from skilled therapeutic intervention in order to improve on the following deficits Pain;Improper body mechanics;Postural dysfunction;Decreased strength;Hypomobility;Decreased endurance;Decreased activity tolerance;Decreased balance;Increased muscle spasms   Rehab Potential Good   PT Frequency 1x / week   PT Duration 6 weeks   PT Treatment/Interventions ADLs/Self Care Home Management;Electrical Stimulation;Cryotherapy;Moist Heat;Ultrasound;Neuromuscular re-education;Balance  training;Therapeutic exercise;Manual techniques;Passive range of motion;Dry needling;Therapeutic activities;Iontophoresis 54m/ml Dexamethasone   PT Next Visit Plan assess response to HEP, hip stretching, strengthening modalities PRN   PT Home Exercise Plan see HEP handout   Consulted and Agree with Plan of Care Patient;Family member/caregiver   Family Member Consulted wife          G-Codes - [phone]    Functional Limitation Mobility: Walking and moving around   Mobility: Walking and Moving Around Current  Status [phone] At least 20 percent but less than 40 percent impaired, limited or restricted   Mobility: Walking and Moving Around Goal Status [phone] At least 1 percent but less than 20 percent impaired, limited or restricted       Problem List Patient Active Problem List   Diagnosis Date Noted  . Routine general medical examination at a health care facility 04/24/2014  . Esophageal stricture 03/12/2014  . Carotid stenosis 02/24/2014  . Memory loss 02/20/2014  . Diabetes mellitus without complication 118/86/7737 . Barrett's esophagus 10/11/2011  . FATTY LIVER DISEASE 08/27/2008  . HLD (hyperlipidemia) 05/20/2008  . Glaucoma 05/20/2008  . Essential hypertension 05/20/2008  . Obstructive chronic bronchitis with exacerbation, Copd Gold D 05/20/2008  . ARTHRITIS 05/20/2008                             PHYSICAL THERAPY DISCHARGE SUMMARY  Visits from Start of Care: 1  Current functional level related to goals / functional outcomes: FOTO 66   Remaining deficits: See goals   Education / Equipment: HEP handout  Plan: Patient agrees to discharge.  Patient goals were not met. Patient is being discharged due to the patient's request.  ?????    KStarr LakePT, DPT, LAT, ATC  02016/06/10 5:03 PM   CPepperm[facility] VillageCW[facility] Center[address]ans [place] N[place] [address]Phone: [phone]  Fax:  [phone]

## 2014-06-24 NOTE — Patient Instructions (Signed)
   Amyria Komar PT, DPT, LAT, ATC  [facility] Phone: [phone]     

## 2014-07-02 ENCOUNTER — Ambulatory Visit (INDEPENDENT_AMBULATORY_CARE_PROVIDER_SITE_OTHER): Payer: Medicare HMO | Admitting: Internal Medicine

## 2014-07-02 ENCOUNTER — Encounter: Payer: Self-pay | Admitting: Internal Medicine

## 2014-07-02 ENCOUNTER — Ambulatory Visit (INDEPENDENT_AMBULATORY_CARE_PROVIDER_SITE_OTHER)
Admission: RE | Admit: 2014-07-02 | Discharge: 2014-07-02 | Disposition: A | Discharge status: Home / Self Care | Payer: Medicare HMO | Source: Ambulatory Visit | Attending: Internal Medicine | Admitting: Internal Medicine

## 2014-07-02 DIAGNOSIS — J441 Chronic obstructive pulmonary disease with (acute) exacerbation: Secondary | ICD-10-CM

## 2014-07-02 MED ORDER — PREDNISONE 10 MG PO TABS: ORAL_TABLET | ORAL | Status: DC

## 2014-07-02 MED ORDER — BUDESONIDE-FORMOTEROL FUMARATE 160-4.5 MCG/ACT IN AERO: INHALATION_SPRAY | RESPIRATORY_TRACT | Status: DC

## 2014-07-02 NOTE — Progress Notes (Signed)
Quick Note:  Spoke with pt and notified of results per Dr. Wert. Pt verbalized understanding and denied any questions.  ______ 

## 2014-07-02 NOTE — Progress Notes (Signed)
   Subjective:    Patient ID: Chad [name], male    DOB: [birth year]    MRN: [id number]  Brief patient profile:  84 yowm quit smoking x 1980 with GOLD II copd by pfts 06/10/08 and freq exac which improve transiently on steroids   07/02/2014  Acute ov/Quintina Hakeem re: aecopd/ ab plus ? Pseudoasthma  Chief Complaint  Patient presents with  . Acute Visit    Pt c/o wheezing and cough x 5 days. Cough is non prod. He has also started to have some nasal congestion over the past couple of days. He is using albuterol inhaler approx 3 x per day and has not had to use neb at all.   gradually worse cough/ subj wheeze since last prednisone /win a week or two of ov.  Worse day than noct/ comfortable at rest p saba but use way up over baseline  No obvious day to day or daytime variabilty or assoc excess or purulent sputum or cp or chest tightness, subjective wheeze overt   hb symptoms. No unusual exp hx or h/o childhood pna/ asthma or knowledge of premature birth.  Sleeping ok without nocturnal  or early am exacerbation  of respiratory  c/o's or need for noct saba. Also denies any obvious fluctuation of symptoms with weather or environmental changes or other aggravating or alleviating factors except as outlined above   Current Medications, Allergies, Complete Past Medical History, Past Surgical History, Family History, and Social History were reviewed in [employer] record.  ROS  The following are not active complaints unless bolded sore throat, dysphagia, dental problems, itching, sneezing,  nasal congestion or excess/ purulent secretions, ear ache,   fever, chills, sweats, unintended wt loss, pleuritic or exertional cp, hemoptysis,  orthopnea pnd or leg swelling, presyncope, palpitations, heartburn, abdominal pain, anorexia, nausea, vomiting, diarrhea  or change in bowel or urinary habits, change in stools or urine, dysuria,hematuria,  rash, arthralgias, visual complaints, headache, numbness  weakness or ataxia or problems with walking or coordination,  change in mood/affect or memory.                    Objective:   amb stoic wm nad/ mostly pseudowheeze   Wt Readings from Last 3 Encounters:  07/02/14 177 lb (80.287 kg)  06/16/14 177 lb 9.6 oz (80.559 kg)  05/30/14 175 lb (79.379 kg)    Vital signs reviewed    HEENT: nl dentition, turbinates, and orophanx. Nl external ear canals without cough reflex   NECK :  without JVD/Nodes/TM/ nl carotid upstrokes bilaterally   LUNGS: no acc muscle use, distant bs/ minimal wheeze    CV:  RRR  no s3 or murmur or increase in P2, no edema   ABD:  Protuberant but soft and nontender with nl excursion in the supine position. No bruits or organomegaly, bowel sounds nl  MS:  warm without deformities, calf tenderness, cyanosis or clubbing  SKIN: warm and dry without lesions    NEURO:  alert, approp, no deficits      CXR PA and Lateral:   07/02/2014 :     I personally reviewed images and agree with radiology impression as follows:     COPD, bibasilar subsegmental atelectasis versus scarring. There is no pneumonia nor CHF.     Assessment & Plan:

## 2014-07-02 NOTE — Patient Instructions (Addendum)
Omeprazole Take 30- 60 min before your first and last meals of the day  GERD (REFLUX)  is an extremely common cause of respiratory symptoms just like yours , many times with no obvious heartburn at all.    It can be treated with medication, but also with lifestyle changes including avoidance of late meals, elevation of the head of your bed (ideally with 6 inch  bed blocks) excessive alcohol, smoking cessation, and avoid fatty foods, chocolate, peppermint, colas, red wine, and acidic juices such as orange juice.  NO MINT OR MENTHOL PRODUCTS SO NO COUGH DROPS  USE SUGARLESS CANDY INSTEAD (Jolley ranchers or Stover's or Life Savers) or even ice chips will also do - the key is to swallow to prevent all throat clearing. NO OIL BASED VITAMINS - use powdered substitutes.    Stop powdered inhalers = spiriva / foradil and start symbicort 160 Take 2 puffs first thing in am and then another 2 puffs about 12 hours later.   Work on inhaler technique:  relax and gently blow all the way out then take a nice smooth deep breath back in, triggering the inhaler at same time you start breathing in.  Hold for up to 5 seconds if you can.  Rinse and gargle with water when done     Only use your albuterol as a rescue medication to be used if you can't catch your breath by resting or doing a relaxed purse lip breathing pattern.  - The less you use it, the better it will work when you need it. - Ok to use up to 2 puffs  every 4 hours if you must but call for immediate appointment if use goes up over your usual need - Don't leave home without it !!  (think of it like the spare tire for your car)   Only use albuterol nebulizer up to every 4 hours if needed but goal is not need it all   Prednisone 10 mg take  4 each am x 2 days,   2 each am x 2 days,  1 each am x 2 days and stop

## 2014-07-03 ENCOUNTER — Encounter: Payer: Self-pay | Admitting: Internal Medicine

## 2014-07-03 NOTE — Assessment & Plan Note (Addendum)
-   PFTs 06/10/08  FEV1 1.73 (61%) ratio 55 with ERV 232 and DLCO 87%    Clearly not a typical copd scenario/ much more steroid responsive by hx and very difficult to control symptoms well after stopped all smoking.  DDX of  difficult airways management all start with A and  include Adherence, Ace Inhibitors, Acid Reflux, Active Sinus Disease, Alpha 1 Antitripsin deficiency, Anxiety masquerading as Airways dz,  ABPA,  allergy(esp in young), Aspiration (esp in elderly), Adverse effects of DPI,  Active smokers, plus two Bs  = Bronchiectasis and Beta blocker use..and one C= CHF   Adherence is always the initial "prime suspect" and is a multilayered concern that requires a "trust but verify" approach in every patient - starting with knowing how to use medications, especially inhalers, correctly, keeping up with refills and understanding the fundamental difference between maintenance and prns vs those medications only taken for a very short course and then stopped and not refilled.  - The proper method of use, as well as anticipated side effects, of a metered-dose inhaler are discussed and demonstrated to the patient. Improved effectiveness after extensive coaching during this visit to a level of approximately  75% > try symbicort 160 2bid  ? Acid (or non-acid) GERD > always difficult to exclude as up to 75% of pts in some series report no assoc GI/ Heartburn symptoms and he has documented Barretts> rec max (24h)  acid suppression and diet restrictions/ reviewed and instructions given in writing.   ? Adverse effect from dpi > stop all powders   ? Active sinus dz > consider sinus ct  ? Allergy > Prednisone 10 mg take  4 each am x 2 days,   2 each am x 2 days,  1 each am x 2 days and stop but should not need to use longterm if we can get him to use ICS effectively.   ? Anxiety/ vcd dz of exclusion   I had an extended discussion with the patient and wife reviewing all relevant studies completed to date  and  lasting 15 to 20 minutes of a 25 minute visit on the following ongoing concerns:   Each maintenance medication was reviewed in detail including most importantly the difference between maintenance and as needed and under what circumstances the prns are to be used.  Please see instructions for details which were reviewed in writing and the patient given a copy.

## 2014-07-08 ENCOUNTER — Encounter: Payer: Medicare HMO | Admitting: Physical Therapy

## 2014-07-08 ENCOUNTER — Other Ambulatory Visit: Payer: Self-pay | Admitting: Internal Medicine

## 2014-07-15 ENCOUNTER — Encounter: Payer: Medicare HMO | Admitting: Physical Therapy

## 2014-07-17 ENCOUNTER — Other Ambulatory Visit: Payer: Self-pay | Admitting: Internal Medicine

## 2014-07-22 ENCOUNTER — Encounter: Payer: Medicare HMO | Admitting: Physical Therapy

## 2014-07-24 ENCOUNTER — Ambulatory Visit: Payer: Medicare HMO | Admitting: Internal Medicine

## 2014-07-24 ENCOUNTER — Encounter: Payer: Self-pay | Admitting: Internal Medicine

## 2014-07-24 ENCOUNTER — Ambulatory Visit (INDEPENDENT_AMBULATORY_CARE_PROVIDER_SITE_OTHER): Payer: Medicare HMO | Admitting: Internal Medicine

## 2014-07-24 VITALS — BP 132/72 | HR 66 | Temp 98.2°F | Resp 12 | Ht 69.0 in | Wt 176.0 lb

## 2014-07-24 DIAGNOSIS — R413 Other amnesia: Secondary | ICD-10-CM

## 2014-07-24 DIAGNOSIS — I10 Essential (primary) hypertension: Secondary | ICD-10-CM

## 2014-07-24 DIAGNOSIS — E119 Type 2 diabetes mellitus without complications: Secondary | ICD-10-CM

## 2014-07-24 NOTE — Assessment & Plan Note (Signed)
Reviewed labs today from [place]. HgA1c 6.6 and will stop glipizide. Continue metformin 500 mg bid and if HgA1c still <7 at next check can decrease to 500 mg daily. Is on ARB and no signs of kidney damage. Exercising okay and diet better (wife is controlling food supply due to his dementia not able to choose appropriately).

## 2014-07-24 NOTE — Progress Notes (Signed)
Pre visit review using our clinic review tool, if applicable. No additional management support is needed unless otherwise documented below in the visit note. 

## 2014-07-24 NOTE — Assessment & Plan Note (Signed)
BP controlled on clonidine, amlodipine, losartan. Last BMP reviewed and not complicated no indication for therapy.

## 2014-07-24 NOTE — Progress Notes (Signed)
   Subjective:    Patient ID: Chad [name], male    DOB: May 21, 1929, 81 y.o.   MRN: [id number]  HPI The patient is an 81 YO man who is coming in to follow up on his diabetes and blood pressure. His diabetes (well controlled, HgA1c 6.6 from [place] labs, taking glipizide and metformin, no side effects, not complicated), hypertension (controlled on amlodipine, clonidine, losartan, not complicated), dementia (doing well with aricept and not a lot of progression, no side effects). No new complaints. Denies hypoglcyemia. Since decreasing the metformin doing much better.   Review of Systems  Unable to perform ROS: Dementia  Constitutional: Negative.   Gastrointestinal: Negative for nausea, abdominal pain, constipation and abdominal distention.  Musculoskeletal: Negative for myalgias, back pain and arthralgias.  Skin: Negative.   Neurological: Negative for dizziness, weakness and headaches.       Memory changes  Psychiatric/Behavioral: Negative.       Objective:   Physical Exam  Constitutional: He appears well-developed and well-nourished.  HENT:  Head: Normocephalic and atraumatic.  Eyes: EOM are normal.  Neck: Normal range of motion.  Cardiovascular: Normal rate.   Pulmonary/Chest: Effort normal. No respiratory distress. He has no wheezes. He has no rales.  Abdominal: Soft. Bowel sounds are normal. He exhibits no distension. There is no tenderness. There is no rebound.  Neurological: Coordination normal.  Skin: Skin is warm and dry.   Filed Vitals:   07/24/14 0923  BP: 132/72  Pulse: 66  Temp: 98.2 F (36.8 C)  TempSrc: Oral  Resp: 12  Height: 5\' 9"  (1.753 m)  Weight: 176 lb (79.833 kg)  SpO2: 94%      Assessment & Plan:

## 2014-07-24 NOTE — Patient Instructions (Addendum)
We will have you stop the glipizide. You can take up the rest that you have and then stop once you are out.   You are doing great! We will see you back in 3-6 months to check on how you are doing. Please feel free to call us if you need [place] sooner.   Diabetes and Exercise Exercising regularly is important. It is not just about losing weight. It has many health benefits, such as:  Improving your overall fitness, flexibility, and endurance.  Increasing your bone density.  Helping with weight control.  Decreasing your body fat.  Increasing your muscle strength.  Reducing stress and tension.  Improving your overall health. People with diabetes who exercise gain additional benefits because exercise:  Reduces appetite.  Improves the body's use of blood sugar (glucose).  Helps lower or control blood glucose.  Decreases blood pressure.  Helps control blood lipids (such as cholesterol and triglycerides).  Improves the body's use of the hormone insulin by:  Increasing the body's insulin sensitivity.  Reducing the body's insulin needs.  Decreases the risk for heart disease because exercising:  Lowers cholesterol and triglycerides levels.  Increases the levels of good cholesterol (such as high-density lipoproteins [HDL]) in the body.  Lowers blood glucose levels. YOUR ACTIVITY PLAN  Choose an activity that you enjoy and set realistic goals. Your health care provider or diabetes educator can help you make an activity plan that works for you. Exercise regularly as directed by your health care provider. This includes:  Performing resistance training twice a week such as push-ups, sit-ups, lifting weights, or using resistance bands.  Performing 150 minutes of cardio exercises each week such as walking, running, or playing sports.  Staying active and spending no more than 90 minutes at one time being inactive. Even short bursts of exercise are good for you. Three 10-minute sessions  spread throughout the day are just as beneficial as a single 30-minute session. Some exercise ideas include:  Taking the dog for a walk.  Taking the stairs instead of the elevator.  Dancing to your favorite song.  Doing an exercise video.  Doing your favorite exercise with a friend. RECOMMENDATIONS FOR EXERCISING WITH TYPE 1 OR TYPE 2 DIABETES   Check your blood glucose before exercising. If blood glucose levels are greater than 240 mg/dL, check for urine ketones. Do not exercise if ketones are present.  Avoid injecting insulin into areas of the body that are going to be exercised. For example, avoid injecting insulin into:  The arms when playing tennis.  The legs when jogging.  Keep a record of:  Food intake before and after you exercise.  Expected peak times of insulin action.  Blood glucose levels before and after you exercise.  The type and amount of exercise you have done.  Review your records with your health care provider. Your health care provider will help you to develop guidelines for adjusting food intake and insulin amounts before and after exercising.  If you take insulin or oral hypoglycemic agents, watch for signs and symptoms of hypoglycemia. They include:  Dizziness.  Shaking.  Sweating.  Chills.  Confusion.  Drink plenty of water while you exercise to prevent dehydration or heat stroke. Body water is lost during exercise and must be replaced.  Talk to your health care provider before starting an exercise program to make sure it is safe for you. Remember, almost any type of activity is better than none. Document Released: 04/02/2003 Document Revised: 05/27/2013 Document Reviewed:  06/19/2012 Exit[employer] Patient Information 2015 [place], [place]. This information is not intended to replace advice given to you by your health care provider. Make sure you discuss any questions you have with your health care provider.

## 2014-07-24 NOTE — Assessment & Plan Note (Signed)
Continue aricept. No side effects. Decline is stable at this time.

## 2014-07-25 ENCOUNTER — Encounter (HOSPITAL_COMMUNITY): Payer: Self-pay | Admitting: Emergency Medicine

## 2014-07-25 ENCOUNTER — Emergency Department (HOSPITAL_COMMUNITY)
Admission: EM | Admit: 2014-07-25 | Discharge: 2014-07-25 | Disposition: A | Discharge status: Home / Self Care | Payer: Medicare HMO | Attending: Emergency Medicine | Admitting: Emergency Medicine

## 2014-07-25 ENCOUNTER — Emergency Department (HOSPITAL_COMMUNITY): Payer: Medicare HMO

## 2014-07-25 DIAGNOSIS — R531 Weakness: Secondary | ICD-10-CM | POA: Insufficient documentation

## 2014-07-25 DIAGNOSIS — R1030 Lower abdominal pain, unspecified: Secondary | ICD-10-CM | POA: Diagnosis not present

## 2014-07-25 DIAGNOSIS — Z8701 Personal history of pneumonia (recurrent): Secondary | ICD-10-CM | POA: Insufficient documentation

## 2014-07-25 DIAGNOSIS — K219 Gastro-esophageal reflux disease without esophagitis: Secondary | ICD-10-CM | POA: Diagnosis not present

## 2014-07-25 DIAGNOSIS — Z7982 Long term (current) use of aspirin: Secondary | ICD-10-CM | POA: Insufficient documentation

## 2014-07-25 DIAGNOSIS — I10 Essential (primary) hypertension: Secondary | ICD-10-CM | POA: Insufficient documentation

## 2014-07-25 DIAGNOSIS — I2510 Atherosclerotic heart disease of native coronary artery without angina pectoris: Secondary | ICD-10-CM | POA: Insufficient documentation

## 2014-07-25 DIAGNOSIS — M1990 Unspecified osteoarthritis, unspecified site: Secondary | ICD-10-CM | POA: Insufficient documentation

## 2014-07-25 DIAGNOSIS — Z87442 Personal history of urinary calculi: Secondary | ICD-10-CM | POA: Insufficient documentation

## 2014-07-25 DIAGNOSIS — Z79899 Other long term (current) drug therapy: Secondary | ICD-10-CM | POA: Diagnosis not present

## 2014-07-25 DIAGNOSIS — R509 Fever, unspecified: Secondary | ICD-10-CM | POA: Insufficient documentation

## 2014-07-25 DIAGNOSIS — Z880 Allergy status to penicillin: Secondary | ICD-10-CM | POA: Diagnosis not present

## 2014-07-25 DIAGNOSIS — J449 Chronic obstructive pulmonary disease, unspecified: Secondary | ICD-10-CM | POA: Insufficient documentation

## 2014-07-25 DIAGNOSIS — Z85828 Personal history of other malignant neoplasm of skin: Secondary | ICD-10-CM | POA: Insufficient documentation

## 2014-07-25 DIAGNOSIS — E119 Type 2 diabetes mellitus without complications: Secondary | ICD-10-CM | POA: Insufficient documentation

## 2014-07-25 DIAGNOSIS — Z87891 Personal history of nicotine dependence: Secondary | ICD-10-CM | POA: Insufficient documentation

## 2014-07-25 LAB — I-STAT CG4 LACTIC ACID, ED
LACTIC ACID, VENOUS: 2.01 mmol/L — AB (ref 0.5–2.0)
Lactic Acid, Venous: 1.61 mmol/L (ref 0.5–2.0)

## 2014-07-25 LAB — CBC WITH DIFFERENTIAL/PLATELET
Basophils Absolute: 0 10*3/uL (ref 0.0–0.1)
Basophils Relative: 0 % (ref 0–1)
EOS PCT: 1 % (ref 0–5)
Eosinophils Absolute: 0.1 10*3/uL (ref 0.0–0.7)
HEMATOCRIT: 39.9 % (ref 39.0–52.0)
HEMOGLOBIN: 12.7 g/dL — AB (ref 13.0–17.0)
Lymphocytes Relative: 5 % — ABNORMAL LOW (ref 12–46)
Lymphs Abs: 1 10*3/uL (ref 0.7–4.0)
MCH: 26.1 pg (ref 26.0–34.0)
MCHC: 31.8 g/dL (ref 30.0–36.0)
MCV: 82.1 fL (ref 78.0–100.0)
MONO ABS: 1.8 10*3/uL — AB (ref 0.1–1.0)
MONOS PCT: 9 % (ref 3–12)
Neutro Abs: 17.6 10*3/uL — ABNORMAL HIGH (ref 1.7–7.7)
Neutrophils Relative %: 85 % — ABNORMAL HIGH (ref 43–77)
Platelets: 215 10*3/uL (ref 150–400)
RBC: 4.86 MIL/uL (ref 4.22–5.81)
RDW: 16 % — AB (ref 11.5–15.5)
WBC: 20.5 10*3/uL — AB (ref 4.0–10.5)

## 2014-07-25 LAB — BASIC METABOLIC PANEL
Anion gap: 11 (ref 5–15)
BUN: 20 mg/dL (ref 6–20)
CALCIUM: 10.3 mg/dL (ref 8.9–10.3)
CHLORIDE: 96 mmol/L — AB (ref 101–111)
CO2: 28 mmol/L (ref 22–32)
CREATININE: 1.34 mg/dL — AB (ref 0.61–1.24)
GFR calc non Af Amer: 48 mL/min — ABNORMAL LOW (ref >60)
GFR, EST AFRICAN AMERICAN: 55 mL/min — AB (ref >60)
Glucose, Bld: 177 mg/dL — ABNORMAL HIGH (ref 65–99)
Potassium: 4.4 mmol/L (ref 3.5–5.1)
Sodium: 135 mmol/L (ref 135–145)

## 2014-07-25 LAB — URINALYSIS, ROUTINE W REFLEX MICROSCOPIC
GLUCOSE, UA: NEGATIVE mg/dL
Hgb urine dipstick: NEGATIVE
Ketones, ur: NEGATIVE mg/dL
Leukocytes, UA: NEGATIVE
NITRITE: NEGATIVE
PH: 6 (ref 5.0–8.0)
Protein, ur: NEGATIVE mg/dL
SPECIFIC GRAVITY, URINE: 1.019 (ref 1.005–1.030)
Urobilinogen, UA: 1 mg/dL (ref 0.0–1.0)

## 2014-07-25 LAB — TROPONIN I: Troponin I: <0.03 ng/mL (ref <0.031)

## 2014-07-25 LAB — I-STAT TROPONIN, ED: Troponin i, poc: 0.17 ng/mL (ref 0.00–0.08)

## 2014-07-25 MED ORDER — VANCOMYCIN HCL IN DEXTROSE 1-5 GM/200ML-% IV SOLN
1000.0000 mg | Freq: Once | INTRAVENOUS | Status: AC
  Administered 2014-07-25: 1000 mg via INTRAVENOUS
  Filled 2014-07-25: qty 200

## 2014-07-25 MED ORDER — DOXYCYCLINE HYCLATE 100 MG PO CAPS: 100.0000 mg | ORAL_CAPSULE | Freq: Two times a day (BID) | ORAL | Status: DC

## 2014-07-25 MED ORDER — SODIUM CHLORIDE 0.9 % IV BOLUS (SEPSIS)
1000.0000 mL | Freq: Once | INTRAVENOUS | Status: AC
  Administered 2014-07-25: 1000 mL via INTRAVENOUS

## 2014-07-25 MED ORDER — ASPIRIN 81 MG PO CHEW
324.0000 mg | CHEWABLE_TABLET | Freq: Once | ORAL | Status: AC
  Administered 2014-07-25: 324 mg via ORAL
  Filled 2014-07-25: qty 4

## 2014-07-25 NOTE — ED Notes (Addendum)
Pt arrived from home by [facility] with c/o fever and weakness that started this morning around 0700. Pt wife took his temp and it was 103.5, self administered 1 gram of Tylenol and fever before EMS arrived came down to 101.3. Upon arrival to ED pt temp 98.5. Pt stated that he is feeling better since his fever came down. O2sat 92%ra, no home o2, hx of COPD. Pt has hernia to abdomen that is large, stated that it is hurting a little more than normal. NSR on monitor with some PACs. Pt was also seen by PCP yesterday for regular check up and everything checked out good.

## 2014-07-25 NOTE — ED Provider Notes (Signed)
CSN: [id number]     Arrival date & time 07/25/14  1015 History   First MD Initiated Contact with Patient 07/25/14 1019     Chief Complaint  Patient presents with  . Fever  . Weakness     (Consider location/radiation/quality/duration/timing/severity/associated sxs/prior Treatment) Patient is a 81 y.o. male presenting with fever and weakness. The history is provided by the patient and medical records.  Fever Weakness Associated symptoms include a fever and weakness.   This is an 81 year old male with past medical history significant for coronary artery disease, hyperlipidemia, hypertension, COPD, diabetes,  kidney stones, gastric ulcers, presenting to the ED for fever and generalized weakness that began this morning. Wife reports temperature at home was 103.5 Fahrenheit. She gave patient 1 g of Tylenol with improvement of fever to 101.3 Fahrenheit prior to leaving home. EMS was called and O2 sats were initially 92% on room air. Patient states he is feeling much better at this time. He denies any current complaints. No chest pain, shortness of breath, cough, nasal congestion, abdominal pain, nausea, vomiting, or diarrhea. No urinary symptoms.  Patient does have a large umbilical hernia, been present for several years that is not amenable to repair at this time.  States it is just "bothersome".  Seen by PCP yesterday for routine appt, felt ok then.  VSS.  Past Medical History  Diagnosis Date  . Fatty liver disease, nonalcoholic   . Coronary artery stenosis   . Diverticulosis of colon   . Diaphragmatic hernia without mention of obstruction or gangrene   . Dyspnea   . Personal history of other malignant neoplasm of skin   . Barrett's esophagus   . TIA (transient ischemic attack) 1998 X "a few"  . Hyperlipidemia   . Acid reflux disease   . Glaucoma   . HTN (hypertension)   . RENAL CALCULUS, HX OF 08/28/2008  . CAROTID ARTERY STENOSIS, BILATERAL 08/27/2008  . Esophageal stenosis   . COPD  (chronic obstructive pulmonary disease)     "severe" (12/20/2013)  . Chronic bronchitis     "most q yr; he's had it several times" (12/20/2013)  . Pneumonia 1990's X 4  . Type II diabetes mellitus   . History of blood transfusion 2007    "related to OR"  . History of hiatal hernia     "repaired when esophagus removed"  . History of stomach ulcers   . Sinus headache     "seasonal"  . Arthritis     "all over"  . Kidney stones     "passed them all" (12/20/2013)  . Basal cell carcinoma of nose    Past Surgical History  Procedure Laterality Date  . Esophagectomy  2007    with stomach pull through  . Pars plana vitrectomy Bilateral 01/2007-05/2007  . Ventral hernia repair  02/2006  . Knee arthroscopy Bilateral 04/1989; 08/1996; 06/1999    "right; left; left"  . Carotid endarterectomy Bilateral 02/1996-03/1996  . Shoulder arthroscopy Right 04/1998  . Mohs surgery  ?2002    "tip of nose; basal cell"  . Nasal polyp excision  2005    "benign"  . Hernia repair    . Cataract extraction w/ intraocular lens implant Bilateral 04/2007 - 09/2007    "left-right"  . Glaucoma surgery Right 11/2008    "implanted drain tube"  . Esophagogastroduodenoscopy (egd) with esophageal dilation  10/2011  . Eye surgery    . Eye surgery Right 04/2006; 08/2007    "had gas bubbles put in"  .  Eye surgery Right 01/2007    "air bubble"  . Eye surgery Left 12/12/2013    "cleaned his implant w/laser"   Family History  Problem Relation Age of Onset  . Colon cancer Mother   . Lung cancer Mother   . Heart attack Mother   . Colon cancer Maternal Grandfather   . Heart attack Brother   . Heart attack Brother   . Stomach cancer Father   . Esophageal cancer Father    History  Substance Use Topics  . Smoking status: Former Smoker -- 1.00 packs/day for 40 years    Types: Cigarettes    Quit date: 01/24/1978  . Smokeless tobacco: Former Systems developer  . Alcohol Use: No    Review of Systems  Constitutional: Positive for  fever.  Neurological: Positive for weakness.      Allergies  Moxifloxacin; Penicillins; Sulfonamide derivatives; and Timolol maleate  Home Medications   Prior to Admission medications   Medication Sig Start Date End Date Taking? Authorizing Provider  albuterol (PROAIR HFA) 108 (90 BASE) MCG/ACT inhaler Inhale 1-2 puffs into the lungs every 6 (six) hours as needed. For breathing    Historical Provider, MD  albuterol (PROVENTIL) (2.5 MG/3ML) 0.083% nebulizer solution 1 vial in nebulizer every 4-6 hours as needed for wheezing/shortness of breath 12/26/13   Tammy S Parrett, NP  amLODipine (NORVASC) 5 MG tablet Take 1 tablet (5 mg total) by mouth daily. 04/24/14   Olga Millers, MD  aspirin 81 MG tablet Take 81 mg by mouth daily.      Historical Provider, MD  brimonidine (ALPHAGAN P) 0.1 % SOLN Place 1 drop into the left eye 2 (two) times daily.      Historical Provider, MD  budesonide-formoterol (SYMBICORT) 160-4.5 MCG/ACT inhaler Take 2 puffs first thing in am and then another 2 puffs about 12 hours later. 07/02/14   Tanda Rockers, MD  Cholecalciferol (VITAMIN D) 1000 UNITS capsule Take 1,000 Units by mouth daily.      Historical Provider, MD  cloNIDine (CATAPRES) 0.2 MG tablet 1 tab by mouth as needed when BP is over 140     Historical Provider, MD  diazepam (VALIUM) 5 MG tablet Take 2.5 mg by mouth daily as needed (dizziness, inner ear).     Historical Provider, MD  donepezil (ARICEPT) 5 MG tablet TAKE 1 TABLET BY MOUTH DAILY 07/09/14   Olga Millers, MD  levocetirizine (XYZAL) 5 MG tablet TAKE 1 TABLET BY MOUTH EVERY EVENING 05/19/14   Olga Millers, MD  losartan (COZAAR) 100 MG tablet Take 1 tablet (100 mg total) by mouth daily. 04/16/14   Olga Millers, MD  metFORMIN (GLUCOPHAGE) 500 MG tablet Take 1 tablet (500 mg total) by mouth 2 (two) times daily with a meal. 04/24/14   Olga Millers, MD  omeprazole (PRILOSEC) 20 MG capsule Take 40 mg by mouth 2 (two) times daily.      Historical Provider, MD  promethazine (PHENERGAN) 25 MG tablet Take 1/2 to 1 tablet as needed.    Historical Provider, MD  sertraline (ZOLOFT) 25 MG tablet TAKE 1 TABLET BY MOUTH EVERY DAY 07/17/14   Olga Millers, MD  valACYclovir (VALTREX) 500 MG tablet Take 1,000 mg by mouth every 8 (eight) hours as needed. For out breaks    Historical Provider, MD  vitamin B-12 (CYANOCOBALAMIN) 1000 MCG tablet Take 1 tablet (1,000 mcg total) by mouth daily. Patient taking differently: Take 2000 mg daily 02/26/14   Real Cons  Kollar, MD   BP 107/41 mmHg  Pulse 84  Temp(Src) 98.5 F (36.9 C) (Oral)  Resp 18  Ht 5\' 9"  (1.753 m)  Wt 170 lb (77.111 kg)  BMI 25.09 kg/m2  SpO2 97%   Physical Exam  Constitutional: He is oriented to person, place, and time. He appears well-developed and well-nourished.  Clammy but not diaphoretic  HENT:  Head: Normocephalic and atraumatic.  Mouth/Throat: Oropharynx is clear and moist.  Eyes: Conjunctivae and EOM are normal. Pupils are equal, round, and reactive to light.  Neck: Normal range of motion.  Cardiovascular: Normal rate, regular rhythm and normal heart sounds.   Pulmonary/Chest: Effort normal and breath sounds normal.  Abdominal: Soft. Bowel sounds are normal. There is tenderness in the suprapubic area. There is no rigidity and no guarding. A hernia is present.  Large umbilical hernia, easily reducible, nontender  Musculoskeletal: Normal range of motion.  Neurological: He is alert and oriented to person, place, and time.  Skin: Skin is warm and dry.  Psychiatric: He has a normal mood and affect.  Nursing note and vitals reviewed.   ED Course  Procedures (including critical care time) Labs Review Labs Reviewed  CBC WITH DIFFERENTIAL/PLATELET - Abnormal; Notable for the following:    WBC 20.5 (*)    Hemoglobin 12.7 (*)    RDW 16.0 (*)    Neutrophils Relative % 85 (*)    Neutro Abs 17.6 (*)    Lymphocytes Relative 5 (*)    Monocytes Absolute 1.8  (*)    All other components within normal limits  BASIC METABOLIC PANEL - Abnormal; Notable for the following:    Chloride 96 (*)    Glucose, Bld 177 (*)    Creatinine, Ser 1.34 (*)    GFR calc non Af Amer 48 (*)    GFR calc Af Amer 55 (*)    All other components within normal limits  URINALYSIS, ROUTINE W REFLEX MICROSCOPIC (NOT AT [facility]) - Abnormal; Notable for the following:    Bilirubin Urine SMALL (*)    All other components within normal limits  I-STAT CG4 LACTIC ACID, ED - Abnormal; Notable for the following:    Lactic Acid, Venous 2.01 (*)    All other components within normal limits  I-STAT TROPOININ, ED - Abnormal; Notable for the following:    Troponin i, poc 0.17 (*)    All other components within normal limits  URINE CULTURE  CULTURE, BLOOD (ROUTINE X 2)  CULTURE, BLOOD (ROUTINE X 2)  TROPONIN I  I-STAT CG4 LACTIC ACID, ED    Imaging Review Dg Chest 2 View  07/25/2014   CLINICAL DATA:  Fever ; weakness  EXAM: CHEST  2 VIEW  COMPARISON:  July 02, 2014 chest radiograph and chest CT May 15, 2008  FINDINGS: The lungs are mildly hyperexpanded. No edema or consolidation. There is scarring in the left perihilar region, stable. Prominence of epicardial fat on the left is again noted. The patient has had a previous partial esophagectomy with gastric pull-through procedure. Heart size is within normal limits. Pulmonary vascularity is normal. No adenopathy. No bone lesions.  IMPRESSION: Postoperative change. Stable prominent epicardial fat on the left. Mild scarring in the left perihilar region. No edema or consolidation. No change in cardiac silhouette.   Electronically Signed   By: Lowella Grip III M.D.   On: 07/25/2014 11:01     EKG Interpretation   Date/Time:  Friday July 25 2014 10:55:26 EDT Ventricular Rate:  77 PR  Interval:  177 QRS Duration: 80 QT Interval:  369 QTC Calculation: 418 R Axis:   89 Text Interpretation:  Sinus rhythm Borderline right axis deviation  Minimal  ST elevation, inferior leads Confirmed by BEATON  MD, ROBERT ([id number]) on  07/25/2014 1:59:13 PM      MDM   Final diagnoses:  Weakness  Fever   81 y.o. M here with fever and weakness.  Sx vastly improved after 1g tylenol this morning given by wife.  Patient currently afebrile, non-toxic.  He has no current complaints.  He does have large umbilical hernia which is easily reducible.  Some suprapubic tenderness noted.  Will obtain labs, u/a, urine culture, blood cultures.  IVF given.  Lab work as above-- lactic acid initially 2.01 correlating with leukocytosis of 20K.  Renal function appears WNL.  i-stat troponin elevated, however lab troponin WNL.  EKG unchanged from prior.  CXR without evidence of pneumonia.  U/a non-infectious.  Blood and urine cultures pending.  Patient continues to appear well, denies complaints.  Attending physician, Dr. Audie Pinto, has evaluated patient-- some concern for possible prostatitis.  Wife now admits that he had some urinary issues this morning which is somewhat atypical for him.  Dr. Audie Pinto recommends to treat with dose of vanc here in ED (patient has allergy to flouroquinolones).  Patient offered admission, however he wants to try OP treatment.  Will give vanc here and d/c with 2 weeks of doxycycline.  Recommend urology follow-up as well as PCP FU.  3:46 PM Vanc infusing.  Dr. Audie Pinto will reassess and d/c when abx finish.  Larene Pickett, PA-C 07/25/14 [place], PA-C 07/25/14 1548  Leonard Schwartz, MD 07/25/14 [phone]

## 2014-07-25 NOTE — Discharge Instructions (Signed)
Take the prescribed medication as directed. Follow-up with your primary care physician. Also recommended to follow-up with urology. Return to the ED for new or worsening symptoms.

## 2014-07-27 LAB — URINE CULTURE: Culture: NO GROWTH

## 2014-07-29 ENCOUNTER — Encounter: Payer: Medicare HMO | Admitting: Physical Therapy

## 2014-07-30 ENCOUNTER — Telehealth: Payer: Self-pay | Admitting: Internal Medicine

## 2014-07-30 ENCOUNTER — Other Ambulatory Visit: Payer: Self-pay | Admitting: Geriatric Medicine

## 2014-07-30 LAB — CULTURE, BLOOD (ROUTINE X 2)
Culture: NO GROWTH
Culture: NO GROWTH

## 2014-07-30 MED ORDER — VALACYCLOVIR HCL 500 MG PO TABS: 1000.0000 mg | ORAL_TABLET | Freq: Three times a day (TID) | ORAL | Status: DC | PRN

## 2014-07-30 NOTE — Telephone Encounter (Signed)
Patient wife walked in. Patient is in need of refill of valACYclovir (VALTREX) 500 MG tablet [[id number]] . We have never filled this, as he gets it @ the [place], but the [place] has a delay and the patient is in need. Verified pharmacy is correct. Please call patsy to advise.

## 2014-08-07 DIAGNOSIS — H548 Legal blindness, as defined in USA: Secondary | ICD-10-CM | POA: Insufficient documentation

## 2014-09-15 ENCOUNTER — Other Ambulatory Visit: Payer: Self-pay | Admitting: Internal Medicine

## 2014-09-15 ENCOUNTER — Other Ambulatory Visit (INDEPENDENT_AMBULATORY_CARE_PROVIDER_SITE_OTHER): Payer: Medicare HMO

## 2014-09-15 DIAGNOSIS — D72829 Elevated white blood cell count, unspecified: Secondary | ICD-10-CM

## 2014-09-15 LAB — CBC
HEMATOCRIT: 37.7 % — AB (ref 39.0–52.0)
Hemoglobin: 12.2 g/dL — ABNORMAL LOW (ref 13.0–17.0)
MCHC: 32.3 g/dL (ref 30.0–36.0)
MCV: 80.4 fl (ref 78.0–100.0)
Platelets: 223 10*3/uL (ref 150.0–400.0)
RBC: 4.69 Mil/uL (ref 4.22–5.81)
RDW: 17.7 % — AB (ref 11.5–15.5)
WBC: 8.2 10*3/uL (ref 4.0–10.5)

## 2014-09-16 ENCOUNTER — Ambulatory Visit (INDEPENDENT_AMBULATORY_CARE_PROVIDER_SITE_OTHER): Payer: Medicare HMO | Admitting: Internal Medicine

## 2014-09-16 ENCOUNTER — Encounter: Payer: Self-pay | Admitting: Internal Medicine

## 2014-09-16 VITALS — BP 128/52 | HR 73 | Temp 98.3°F | Resp 12 | Ht 69.0 in | Wt 169.4 lb

## 2014-09-16 DIAGNOSIS — D72829 Elevated white blood cell count, unspecified: Secondary | ICD-10-CM | POA: Diagnosis not present

## 2014-09-16 DIAGNOSIS — E119 Type 2 diabetes mellitus without complications: Secondary | ICD-10-CM | POA: Diagnosis not present

## 2014-09-16 NOTE — Assessment & Plan Note (Signed)
Will decrease metformin to 500 mg daily since he is having the GI concerns. Still on ARB and exercising when able. Diet is good.

## 2014-09-16 NOTE — Patient Instructions (Signed)
Decrease the metformin to once a day in the morning.   We will see you back in about 2-3 months to check on the labs.   Your white blood count is back to normal.

## 2014-09-16 NOTE — Assessment & Plan Note (Signed)
Previous WBC 20 with only mild signs of infection. Now feeling well and WBC down to 8. Will continue to monitor.

## 2014-09-16 NOTE — Progress Notes (Signed)
   Subjective:    Patient ID: Chad [name], male    DOB: 1929/04/22, 81 y.o.   MRN: [id number]  HPI The patient is an 81 YO man coming in for follow up of several things. He had elevated WBC that needed to be followed up. Was having a mild infection at the time. Feeling well now, no fevers or chills. No weight loss. No lumps or bumps new. His other concern is that he has some loose bowel movements that have urgency. Sometimes once a week and other times several times per week. Loose but not liquid. Not able to always make it to the bathroom due to urgency. Not associated with any triggers including foods.   Review of Systems  Constitutional: Negative.   Respiratory: Negative for cough, chest tightness, shortness of breath and wheezing.   Cardiovascular: Negative for chest pain, palpitations and leg swelling.  Gastrointestinal: Positive for diarrhea. Negative for nausea, vomiting, abdominal pain, constipation, blood in stool and abdominal distention.  Skin: Negative.   Neurological: Negative.       Objective:   Physical Exam  Constitutional: He appears well-developed and well-nourished.  HENT:  Head: Normocephalic and atraumatic.  Eyes: EOM are normal.  Neck: Normal range of motion.  Cardiovascular: Normal rate and regular rhythm.   Pulmonary/Chest: Effort normal. No respiratory distress. He has no wheezes. He has no rales.  Abdominal: Soft. Bowel sounds are normal. He exhibits no distension. There is no tenderness. There is no rebound.  Neurological: Coordination normal.  Skin: Skin is warm and dry.   Filed Vitals:   09/16/14 0942  BP: 128/52  Pulse: 73  Temp: 98.3 F (36.8 C)  TempSrc: Oral  Resp: 12  Height: 5\' 9"  (1.753 m)  Weight: 169 lb 6.4 oz (76.839 kg)  SpO2: 96%      Assessment & Plan:

## 2014-09-16 NOTE — Progress Notes (Signed)
Pre visit review using our clinic review tool, if applicable. No additional management support is needed unless otherwise documented below in the visit note. 

## 2014-11-04 ENCOUNTER — Ambulatory Visit (INDEPENDENT_AMBULATORY_CARE_PROVIDER_SITE_OTHER): Payer: Medicare HMO

## 2014-11-04 DIAGNOSIS — Z23 Encounter for immunization: Secondary | ICD-10-CM

## 2014-12-24 ENCOUNTER — Encounter: Payer: Self-pay | Admitting: Internal Medicine

## 2014-12-24 ENCOUNTER — Ambulatory Visit (INDEPENDENT_AMBULATORY_CARE_PROVIDER_SITE_OTHER): Payer: Medicare HMO | Admitting: Internal Medicine

## 2014-12-24 ENCOUNTER — Other Ambulatory Visit (INDEPENDENT_AMBULATORY_CARE_PROVIDER_SITE_OTHER): Payer: Medicare HMO

## 2014-12-24 VITALS — BP 132/70 | HR 62 | Temp 98.1°F | Resp 12 | Ht 69.0 in | Wt 171.0 lb

## 2014-12-24 DIAGNOSIS — E119 Type 2 diabetes mellitus without complications: Secondary | ICD-10-CM | POA: Diagnosis not present

## 2014-12-24 DIAGNOSIS — J441 Chronic obstructive pulmonary disease with (acute) exacerbation: Secondary | ICD-10-CM | POA: Diagnosis not present

## 2014-12-24 DIAGNOSIS — I10 Essential (primary) hypertension: Secondary | ICD-10-CM

## 2014-12-24 LAB — COMPREHENSIVE METABOLIC PANEL
ALK PHOS: 74 U/L (ref 39–117)
ALT: 14 U/L (ref 0–53)
AST: 12 U/L (ref 0–37)
Albumin: 4.2 g/dL (ref 3.5–5.2)
BILIRUBIN TOTAL: 0.5 mg/dL (ref 0.2–1.2)
BUN: 21 mg/dL (ref 6–23)
CO2: 31 mEq/L (ref 19–32)
CREATININE: 1.27 mg/dL (ref 0.40–1.50)
Calcium: 10.2 mg/dL (ref 8.4–10.5)
Chloride: 101 mEq/L (ref 96–112)
GFR: 57.55 mL/min — AB (ref >60.00)
GLUCOSE: 158 mg/dL — AB (ref 70–99)
Potassium: 4.5 mEq/L (ref 3.5–5.1)
Sodium: 140 mEq/L (ref 135–145)
TOTAL PROTEIN: 7.2 g/dL (ref 6.0–8.3)

## 2014-12-24 LAB — HEMOGLOBIN A1C: HEMOGLOBIN A1C: 7.9 % — AB (ref 4.6–6.5)

## 2014-12-24 MED ORDER — METFORMIN HCL 500 MG PO TABS: 500.0000 mg | ORAL_TABLET | Freq: Every day | ORAL | Status: DC

## 2014-12-24 MED ORDER — BENZONATATE 100 MG PO CAPS: 100.0000 mg | ORAL_CAPSULE | Freq: Two times a day (BID) | ORAL | Status: DC | PRN

## 2014-12-24 NOTE — Progress Notes (Signed)
   Subjective:    Patient ID: Chad [name], male    DOB: 1929-05-04, 81 y.o.   MRN: [id number]  HPI The patient is an 81 YO man coming in for follow up of his diabetes. Last visit we decreased his metformin to 500 mg daily as he was having loose stools and urgency with bowel movements. He is doing much better with this now and only rarely happens. No low or high sugars and diet about the same. Not very active. Having mild cough right now. No fevers or chills, no SOB. No sinus tenderness. Taking muccinex which is helping some.   Review of Systems  Constitutional: Negative.   HENT: Negative for congestion, postnasal drip, rhinorrhea, sinus pressure and trouble swallowing.   Respiratory: Positive for cough. Negative for chest tightness, shortness of breath and wheezing.   Cardiovascular: Negative for chest pain, palpitations and leg swelling.  Gastrointestinal: Negative for nausea, vomiting, abdominal pain, diarrhea, constipation, blood in stool and abdominal distention.  Skin: Negative.   Neurological: Negative.      Objective:   Physical Exam  Constitutional: He appears well-developed and well-nourished.  HENT:  Head: Normocephalic and atraumatic.  Eyes: EOM are normal.  Neck: Normal range of motion.  Cardiovascular: Normal rate and regular rhythm.   Pulmonary/Chest: Effort normal. No respiratory distress. He has no wheezes. He has no rales.  Abdominal: Soft. Bowel sounds are normal. He exhibits no distension. There is no tenderness. There is no rebound.  Neurological: Coordination normal.  Skin: Skin is warm and dry.   Filed Vitals:   12/24/14 0835  BP: 132/70  Pulse: 62  Temp: 98.1 F (36.7 C)  TempSrc: Oral  Resp: 12  Height: 5\' 9"  (1.753 m)  Weight: 171 lb (77.565 kg)  SpO2: 96%      Assessment & Plan:

## 2014-12-24 NOTE — Assessment & Plan Note (Signed)
Continue metformin daily. Check HgA1c today and adjust as needed. Is on ARB. Eye exam up to date.

## 2014-12-24 NOTE — Patient Instructions (Signed)
We have sent in a cough drop called tessalon perles which help encourage the brain to not cough as much.   We will check the blood work today and call you back with the results.   Everything is going well so keep up the good work.

## 2014-12-24 NOTE — Assessment & Plan Note (Signed)
No flare, lungs clear on exam. Tessalon perles for cough and can continue muccinex.

## 2014-12-24 NOTE — Progress Notes (Signed)
Pre visit review using our clinic review tool, if applicable. No additional management support is needed unless otherwise documented below in the visit note. 

## 2014-12-24 NOTE — Assessment & Plan Note (Signed)
BP at goal on amlodipine and losartan. Checking CMP today and adjust as needed.

## 2015-01-22 ENCOUNTER — Ambulatory Visit (INDEPENDENT_AMBULATORY_CARE_PROVIDER_SITE_OTHER): Payer: Medicare HMO | Admitting: Internal Medicine

## 2015-01-22 ENCOUNTER — Encounter: Payer: Self-pay | Admitting: Internal Medicine

## 2015-01-22 VITALS — BP 128/60 | HR 78 | Temp 98.8°F | Resp 14 | Ht 69.0 in | Wt 170.0 lb

## 2015-01-22 DIAGNOSIS — J441 Chronic obstructive pulmonary disease with (acute) exacerbation: Secondary | ICD-10-CM | POA: Diagnosis not present

## 2015-01-22 DIAGNOSIS — R413 Other amnesia: Secondary | ICD-10-CM | POA: Diagnosis not present

## 2015-01-22 MED ORDER — HYDROCODONE-HOMATROPINE 5-1.5 MG/5ML PO SYRP: 5.0000 mL | ORAL_SOLUTION | Freq: Four times a day (QID) | ORAL | Status: DC | PRN

## 2015-01-22 MED ORDER — PREDNISONE 20 MG PO TABS: 40.0000 mg | ORAL_TABLET | Freq: Every day | ORAL | Status: DC

## 2015-01-22 NOTE — Patient Instructions (Signed)
We have given you the cough syrup which you can use at night time.   We have also sent in prednisone for the breathing. Take 2 pills in the morning for 1 week.

## 2015-01-22 NOTE — Assessment & Plan Note (Signed)
Rx for prednisone and hycodan cough syrup. No antibiotics at this time. If no improvement they will call back.

## 2015-01-22 NOTE — Assessment & Plan Note (Signed)
Due to perceived side effect they would like to come off zoloft. This is fine, if he has sadness to return would try remeron.

## 2015-01-22 NOTE — Progress Notes (Signed)
   Subjective:    Patient ID: Chad [name], male    DOB: 04-28-1929, 81 y.o.   MRN: [id number]  HPI The patient is an 81 YO man coming in for follow up on his breathing. He has tried the [employer] and they were not helpful. He has been taking some steroid pills that he had from before due to more SOB and cough in the last several weeks.  He is also concerned that the zoloft could be causing some side effects. 1-2 times per month he has an episode where his hands shake and he gets really stiff. It has helped with the crying and sadness well though. Denies SI/HI.   Review of Systems  Constitutional: Negative.   HENT: Positive for congestion and rhinorrhea. Negative for postnasal drip, sinus pressure and trouble swallowing.   Respiratory: Positive for cough and shortness of breath. Negative for chest tightness and wheezing.   Cardiovascular: Negative for chest pain, palpitations and leg swelling.  Gastrointestinal: Negative for nausea, vomiting, abdominal pain, diarrhea, constipation, blood in stool and abdominal distention.  Skin: Negative.   Neurological: Negative.       Objective:   Physical Exam  Constitutional: He appears well-developed and well-nourished.  HENT:  Head: Normocephalic and atraumatic.  Eyes: EOM are normal.  Neck: Normal range of motion.  Cardiovascular: Normal rate and regular rhythm.   Pulmonary/Chest: Effort normal. No respiratory distress. He has wheezes. He has no rales.  Bilateral wheezing that does not clear with cough.   Abdominal: Soft. Bowel sounds are normal. He exhibits no distension. There is no tenderness. There is no rebound.  Neurological: Coordination normal.  Skin: Skin is warm and dry.   Filed Vitals:   01/22/15 1059  BP: 128/60  Pulse: 78  Temp: 98.8 F (37.1 C)  TempSrc: Oral  Resp: 14  Height: 5\' 9"  (1.753 m)  Weight: 170 lb (77.111 kg)  SpO2: 96%      Assessment & Plan:

## 2015-01-22 NOTE — Progress Notes (Signed)
Pre visit review using our clinic review tool, if applicable. No additional management support is needed unless otherwise documented below in the visit note. 

## 2015-01-30 ENCOUNTER — Encounter: Payer: Self-pay | Admitting: Internal Medicine

## 2015-01-30 ENCOUNTER — Ambulatory Visit (INDEPENDENT_AMBULATORY_CARE_PROVIDER_SITE_OTHER): Payer: Medicare HMO | Admitting: Internal Medicine

## 2015-01-30 ENCOUNTER — Telehealth: Payer: Self-pay | Admitting: Critical Care Medicine

## 2015-01-30 ENCOUNTER — Ambulatory Visit (INDEPENDENT_AMBULATORY_CARE_PROVIDER_SITE_OTHER)
Admission: RE | Admit: 2015-01-30 | Discharge: 2015-01-30 | Disposition: A | Discharge status: Home / Self Care | Payer: Medicare HMO | Source: Ambulatory Visit | Attending: Internal Medicine | Admitting: Internal Medicine

## 2015-01-30 DIAGNOSIS — J449 Chronic obstructive pulmonary disease, unspecified: Secondary | ICD-10-CM

## 2015-01-30 MED ORDER — AMOXICILLIN-POT CLAVULANATE 875-125 MG PO TABS: 1.0000 | ORAL_TABLET | Freq: Two times a day (BID) | ORAL | Status: DC

## 2015-01-30 MED ORDER — ALBUTEROL SULFATE (2.5 MG/3ML) 0.083% IN NEBU: INHALATION_SOLUTION | RESPIRATORY_TRACT | Status: DC

## 2015-01-30 MED ORDER — PREDNISONE 10 MG PO TABS: ORAL_TABLET | ORAL | Status: DC

## 2015-01-30 NOTE — Telephone Encounter (Signed)
Patient notified of Dr. Gustavus Bryant recommendations. Patient scheduled to see Dr. Melvyn Novas today at 2:30pm Patient advised to bring medications in 2 separate bags as mentioned below. Nothing further needed.

## 2015-01-30 NOTE — Progress Notes (Signed)
Subjective:    Patient ID: Chad [name], male    DOB: 12/14/1929    MRN: [id number]  Brief patient profile:  84 yowm quit smoking x 1980 with GOLD II copd by pfts 06/10/08 and freq exac which improve transiently on steroids   History of Present Illness  07/02/2014  Acute ov/Chad [name] re: aecopd/ ab plus ? Pseudoasthma  Chief Complaint  Patient presents with  . Acute Visit    Pt c/o wheezing and cough x 5 days. Cough is non prod. He has also started to have some nasal congestion over the past couple of days. He is using albuterol inhaler approx 3 x per day and has not had to use neb at all.   gradually worse cough/ subj wheeze since last prednisone /win a week or two of ov.  Worse day than noct/ comfortable at rest p saba but use way up over baseline rec Omeprazole Take 30- 60 min before your first and last meals of the day GERD  Stop powdered inhalers = spiriva / foradil and start symbicort 160 Take 2 puffs first thing in am and then another 2 puffs about 12 hours later.  Work on inhaler technique:   Only use your albuterol as a rescue medication Only use albuterol nebulizer up to every 4 hours if needed but goal is not need it all  Prednisone 10 mg take  4 each am x 2 days,   2 each am x 2 days,  1 each am x 2 days and stop     01/30/2015 acute extended ov/Chad [name] re: recurrent cough in pt with COPD II  Chief Complaint  Patient presents with  . Acute Visit    Coughing up greenish/yellow mucus. Chest congestion, nasal congestion. Has taken 2 boxes of Mucinex and 1 bottle of liquid Mucinex already.   was better p prev ov then Onset mid November 2016 acute  assoc with stuffy nose  Has flutter not using / poor insight into prns  Cough only  better after hyrdocodone/ no better p prednisone hfa poor    No obvious day to day or daytime variabilty or assoc increase sob or perceived need for saba or cp or chest tightness, subjective wheeze overt   hb symptoms. No unusual exp hx or h/o  childhood pna/ asthma or knowledge of premature birth.  Sleeping ok without nocturnal  or early am exacerbation  of respiratory  c/o's or need for noct saba. Also denies any obvious fluctuation of symptoms with weather or environmental changes or other aggravating or alleviating factors except as outlined above   Current Medications, Allergies, Complete Past Medical History, Past Surgical History, Family History, and Social History were reviewed in [employer] record.  ROS  The following are not active complaints unless bolded sore throat, dysphagia, dental problems, itching, sneezing,  nasal congestion or excess/ purulent secretions, ear ache,   fever, chills, sweats, unintended wt loss, pleuritic or exertional cp, hemoptysis,  orthopnea pnd or leg swelling, presyncope, palpitations, heartburn, abdominal pain, anorexia, nausea, vomiting, diarrhea  or change in bowel or urinary habits, change in stools or urine, dysuria,hematuria,  rash, arthralgias, visual complaints, headache, numbness weakness or ataxia or problems with walking or coordination,  change in mood/affect or memory.                 Objective:   amb stoic wm nad/ congested rattling cough    01/30/2015          168  07/02/14 177 lb (80.287 kg)  06/16/14 177 lb 9.6 oz (80.559 kg)  05/30/14 175 lb (79.379 kg)    Vital signs reviewed    HEENT: nl dentition, turbinates, and orophanx. Nl external ear canals without cough reflex   NECK :  without JVD/Nodes/TM/ nl carotid upstrokes bilaterally   LUNGS: no acc muscle use, distant bs/ mid exp wheeze    CV:  RRR  no s3 or murmur or increase in P2, no edema   ABD:  Protuberant but soft and nontender with nl excursion in the supine position. No bruits or organomegaly, bowel sounds nl  MS:  warm without deformities, calf tenderness, cyanosis or clubbing  SKIN: warm and dry without lesions    NEURO:  alert, approp, no deficits    CXR PA and Lateral:    01/30/2015 :    I personally reviewed images and agree with radiology impression as follows:   Again noted postoperative changes with prominent epicardial fat pad on the left. No definite superimposed infiltrate or pulmonary edema. Left perihilar scarring is stable.       Assessment & Plan:

## 2015-01-30 NOTE — Telephone Encounter (Signed)
Looks like he just saw his primary and she did not rec abx so we shouldn't either as we haven't seen him since the onset of his problem in Nov (lat ov ws [id number]) but certainly ok to call primary back to see if they will approve over the phone care   Need appt asap/ ok to add on advise When return bring your medications in 2 separate bags, the ones you take no matter(automatically)  what vs the as needed (only when you feel you need them)

## 2015-01-30 NOTE — Patient Instructions (Addendum)
When sick with flare of cough/ congestion  mucinex dm 1200 mg every 12 hours and use the flutter as much as possible  If having bad cough/ congestion in am > use nebulizer albuterol first thing in am   Augmentin 875 mg take one pill twice daily  X 10 days - take at breakfast and supper with large glass of water.  It would help reduce the usual side effects (diarrhea and yeast infections) if you ate cultured yogurt at lunch.   Prednisone 10 mg take  4 each am x 2 days,   2 each am x 2 days,  1 each am x 2 days and stop   Work on inhaler technique:  relax and gently blow all the way out then take a nice smooth deep breath back in, triggering the inhaler at same time you start breathing in.  Hold for up to 5 seconds if you can. Blow out thru nose. Rinse and gargle with water when done  Please remember to go to the  x-ray department downstairs for your tests - we will call you with the results when they are available.    Please schedule a follow up office visit in 6 weeks, call sooner if needed

## 2015-01-30 NOTE — Telephone Encounter (Signed)
Patient has been sick since end of November.  Went to see Dr. Bradd Burner last week, gave him Prednisone, he finished the prednisone.  Coughing up greenish/yellow mucus.  Chest congestion, nasal congestion.  Has taken 2 boxes of Mucinex and 1 bottle of liquid Mucinex already.   [place]  Allergies  Allergen Reactions  . Moxifloxacin Anaphylaxis    Avelox REACTION: anaphylaxis  . Penicillins     REACTION: hives  . Sulfonamide Derivatives     REACTION: hives  . Timolol Maleate     Causes severe chest congestion

## 2015-01-31 NOTE — Assessment & Plan Note (Addendum)
-   PFTs 06/10/08  FEV1 1.73 (61%) ratio 55 with ERV 232 and DLCO 87%  07/02/2014 try symbicort 160 2bid and stop all powders   Acute flare of cough in setting of probable sinus dz  With poor insight into how/ when to use meds he already has  rec augmentinx 10 day then sinus ct vs 10 more days depending on response  Prednisone 10 mg take  4 each am x 2 days,   2 each am x 2 days,  1 each am x 2 days and stop  mucinex dm  /flutter approp use of maint vs prns    - The proper method of use, as well as anticipated side effects, of a metered-dose inhaler are discussed and demonstrated to the patient. Improved effectiveness after extensive coaching during this visit to a level of approximately 75 % from a baseline of 50 %    I had an extended discussion with the patient reviewing all relevant studies completed to date and  lasting 15 to 20 minutes of a 25 minute visit    Each maintenance medication was reviewed in detail including most importantly the difference between maintenance and prns and under what circumstances the prns are to be triggered using an action plan format that is not reflected in the computer generated alphabetically organized AVS.    Please see instructions for details which were reviewed in writing and the patient given a copy highlighting the part that I personally wrote and discussed at today's ov.

## 2015-02-02 NOTE — Progress Notes (Signed)
Quick Note:  Spoke with pt and notified of results per Dr. Wert. Pt verbalized understanding and denied any questions.  ______ 

## 2015-02-09 ENCOUNTER — Telehealth: Payer: Self-pay | Admitting: Internal Medicine

## 2015-02-09 DIAGNOSIS — R413 Other amnesia: Secondary | ICD-10-CM

## 2015-02-09 NOTE — Telephone Encounter (Signed)
Done

## 2015-02-09 NOTE — Telephone Encounter (Signed)
Is requesting referral to Dr. Ellouise Newer with [facility] for dementia.

## 2015-02-17 ENCOUNTER — Telehealth: Payer: Self-pay | Admitting: Internal Medicine

## 2015-02-17 NOTE — Telephone Encounter (Signed)
Pt wife called in and said that the zoft isnt helping and wants to see there is anything else that could be called in?

## 2015-02-18 NOTE — Telephone Encounter (Signed)
Is he having any side effects? If not recommend increasing to 2 pills daily or 50 mg daily.

## 2015-02-18 NOTE — Telephone Encounter (Signed)
Patient has not had any seizures since stopping zoloft. His wife says he is still having crying episodes and she wants to know if something can be sent in to replace the zoloft.

## 2015-02-18 NOTE — Telephone Encounter (Signed)
Pt wife called back in and would for the nurse to call her as soon as possible .  Amy can you please call her she wants to explain some things about this med

## 2015-02-19 MED ORDER — MIRTAZAPINE 15 MG PO TABS: 15.0000 mg | ORAL_TABLET | Freq: Every day | ORAL | Status: DC

## 2015-02-19 NOTE — Addendum Note (Signed)
Addended by: Pricilla Holm A on: 02/19/2015 08:07 AM   Modules accepted: Orders, Medications

## 2015-02-19 NOTE — Telephone Encounter (Signed)
Left message informing patient that a replacement med has been called in.

## 2015-02-19 NOTE — Telephone Encounter (Signed)
These episodes did not represent seizures. We can try remeron for the depression. 1 pill at night time and should see benefit in 3-4 weeks.

## 2015-02-25 ENCOUNTER — Other Ambulatory Visit: Payer: Self-pay | Admitting: Internal Medicine

## 2015-02-25 ENCOUNTER — Telehealth: Payer: Self-pay | Admitting: Internal Medicine

## 2015-02-25 DIAGNOSIS — I6523 Occlusion and stenosis of bilateral carotid arteries: Secondary | ICD-10-CM

## 2015-02-25 NOTE — Telephone Encounter (Signed)
error 

## 2015-02-26 ENCOUNTER — Ambulatory Visit (INDEPENDENT_AMBULATORY_CARE_PROVIDER_SITE_OTHER)
Admission: RE | Admit: 2015-02-26 | Discharge: 2015-02-26 | Disposition: A | Discharge status: Home / Self Care | Payer: Medicare HMO | Source: Ambulatory Visit | Attending: Internal Medicine | Admitting: Internal Medicine

## 2015-02-26 ENCOUNTER — Encounter: Payer: Self-pay | Admitting: Internal Medicine

## 2015-02-26 ENCOUNTER — Ambulatory Visit (INDEPENDENT_AMBULATORY_CARE_PROVIDER_SITE_OTHER): Payer: Medicare HMO | Admitting: Internal Medicine

## 2015-02-26 VITALS — BP 138/68 | HR 71 | Ht 69.0 in | Wt 170.6 lb

## 2015-02-26 DIAGNOSIS — J449 Chronic obstructive pulmonary disease, unspecified: Secondary | ICD-10-CM | POA: Diagnosis not present

## 2015-02-26 DIAGNOSIS — R05 Cough: Secondary | ICD-10-CM

## 2015-02-26 DIAGNOSIS — R059 Cough, unspecified: Secondary | ICD-10-CM

## 2015-02-26 MED ORDER — AMOXICILLIN-POT CLAVULANATE 875-125 MG PO TABS: 1.0000 | ORAL_TABLET | Freq: Two times a day (BID) | ORAL | Status: DC

## 2015-02-26 NOTE — Patient Instructions (Addendum)
Augmentin 875 mg take one pill twice daily  X 10 days - take at breakfast and supper with large glass of water.  It would help reduce the usual side effects (diarrhea and yeast infections) if you ate cultured yogurt at lunch.    prilosec 20 mg Take 30- 60 min before your first and last meals of the day   For cough > mucinex dm 1200 up to twice daily and use the flutter as you can   Work on perfecting inhaler technique:  relax and gently blow all the way out then take a nice smooth deep breath back in, triggering the inhaler at same time you start breathing in.  Hold for up to 5 seconds if you can. Blow out thru nose. Rinse and gargle with water when done  Please see patient coordinator before you leave today  to schedule sinus CT   Please remember to go to the x-ray department downstairs for your tests - we will call you with the results when they are available.  Change next appt to 4 weeks

## 2015-02-26 NOTE — Progress Notes (Signed)
Subjective:    Patient ID: Chad [name], male    DOB: 1930-01-08    MRN: [id number]  Brief patient profile:  85 yowm quit smoking x 1980 with GOLD II copd by pfts 06/10/08 and freq exac which improve transiently on steroids   History of Present Illness  07/02/2014  Acute ov/Chad [name] re: aecopd/ ab plus ? Pseudoasthma  Chief Complaint  Patient presents with  . Acute Visit    Pt c/o wheezing and cough x 5 days. Cough is non prod. He has also started to have some nasal congestion over the past couple of days. He is using albuterol inhaler approx 3 x per day and has not had to use neb at all.   gradually worse cough/ subj wheeze since last prednisone /win a week or two of ov.  Worse day than noct/ comfortable at rest p saba but use way up over baseline rec Omeprazole Take 30- 60 min before your first and last meals of the day GERD diet  Stop powdered inhalers = spiriva / foradil and start symbicort 160 Take 2 puffs first thing in am and then another 2 puffs about 12 hours later.  Work on inhaler technique:   Only use your albuterol as a rescue medication Only use albuterol nebulizer up to every 4 hours if needed but goal is not need it all  Prednisone 10 mg take  4 each am x 2 days,   2 each am x 2 days,  1 each am x 2 days and stop     01/30/2015 acute extended ov/Chad [name] re: recurrent cough in pt with COPD II  Chief Complaint  Patient presents with  . Acute Visit    Coughing up greenish/yellow mucus. Chest congestion, nasal congestion. Has taken 2 boxes of Mucinex and 1 bottle of liquid Mucinex already.   was better p prev ov then Onset mid November 2016 acute  assoc with stuffy nose  Has flutter not using / poor insight into prns  Cough only  better after hyrdocodone/ no better p prednisone hfa poor  rec When sick with flare of cough/ congestion  mucinex dm 1200 mg every 12 hours and use the flutter as much as possible If having bad cough/ congestion in am > use nebulizer albuterol  first thing in am  Augmentin 875 mg take one pill twice daily  X 10 days - take at breakfast and supper with large glass of water.  It would help reduce the usual side effects (diarrhea and yeast infections) if you ate cultured yogurt at lunch.  Prednisone 10 mg take  4 each am x 2 days,   2 each am x 2 days,  1 each am x 2 days and stop  Work on inhaler technique:  relax and gently blow all the way out then take a nice smooth deep breath back in, triggering the inhaler at same time you start breathing in.  Hold for up to 5 seconds if you can. Blow out thru nose. Rinse and gargle with water when done Please remember to go to the  x-ray department downstairs for your tests - we will call you with the results when they are available. Please schedule a follow up office visit in 6 weeks, call sooner if needed      02/26/2015  f/u ov/Chad [name] re:  COPD II/ maint rx symb 160 2bid and prn saba Chief Complaint  Patient presents with  . Acute Visit    No fever as  of this AM. Denies any chills/sweats/nausea/vomiting. yesterday did cough up green phlem but non today. Had wheezing about 1 day ago.  was fine from 1/6 - 1/24 one episode early am sob > 911 > nl vitals, better s rx  Then 02/25/15 coughed up green for the first time    Changed prilosec to alternative rx 2/1 Rare perceived need for saba / not clear he used action plan   No obvious day to day or daytime variabilty or assoc increase sob or perceived need for saba or cp or chest tightness, subjective wheeze overt   hb symptoms. No unusual exp hx or h/o childhood pna/ asthma or knowledge of premature birth.  Sleeping ok without nocturnal  or early am exacerbation  of respiratory  c/o's or need for noct saba. Also denies any obvious fluctuation of symptoms with weather or environmental changes or other aggravating or alleviating factors except as outlined above   Current Medications, Allergies, Complete Past Medical History, Past Surgical History, Family  History, and Social History were reviewed in [employer] record.  ROS  The following are not active complaints unless bolded sore throat, dysphagia, dental problems, itching, sneezing,  nasal congestion or excess/ purulent secretions, ear ache,   fever, chills, sweats, unintended wt loss, pleuritic or exertional cp, hemoptysis,  orthopnea pnd or leg swelling, presyncope, palpitations, heartburn, abdominal pain, anorexia, nausea, vomiting, diarrhea  or change in bowel or urinary habits, change in stools or urine, dysuria,hematuria,  rash, arthralgias, visual complaints, headache, numbness weakness or ataxia or problems with walking or coordination,  change in mood/affect or memory.                 Objective:   amb stoic wm  Extremely hoarse/ nad  02/26/2015            171   01/30/2015          168   07/02/14 177 lb (80.287 kg)  06/16/14 177 lb 9.6 oz (80.559 kg)  05/30/14 175 lb (79.379 kg)    Vital signs reviewed    HEENT: nl dentition, turbinates, and orophanx. Nl external ear canals without cough reflex   NECK :  without JVD/Nodes/TM/ nl carotid upstrokes bilaterally   LUNGS: no acc muscle use, distant bs/ mid exp wheeze    CV:  RRR  no s3 or murmur or increase in P2, no edema   ABD:  Protuberant but soft and nontender with nl excursion in the supine position. No bruits or organomegaly, bowel sounds nl  MS:  warm without deformities, calf tenderness, cyanosis or clubbing  SKIN: warm and dry without lesions    NEURO:  alert, approp, no deficits      CXR PA and Lateral:   02/26/2015 :    I personally reviewed images and agree with radiology impression as follows:      No acute cardiopulmonary disease. Stable appearance from the prior study.     Assessment & Plan:

## 2015-02-27 NOTE — Progress Notes (Signed)
Quick Note:  Spoke with pt and notified of results per Dr. Wert. Pt verbalized understanding and denied any questions.  ______ 

## 2015-03-02 ENCOUNTER — Ambulatory Visit (INDEPENDENT_AMBULATORY_CARE_PROVIDER_SITE_OTHER)
Admission: RE | Admit: 2015-03-02 | Discharge: 2015-03-02 | Disposition: A | Discharge status: Home / Self Care | Payer: Medicare HMO | Source: Ambulatory Visit | Attending: Internal Medicine | Admitting: Internal Medicine

## 2015-03-02 DIAGNOSIS — R05 Cough: Secondary | ICD-10-CM

## 2015-03-02 DIAGNOSIS — R059 Cough, unspecified: Secondary | ICD-10-CM

## 2015-03-03 ENCOUNTER — Other Ambulatory Visit: Payer: Self-pay | Admitting: Internal Medicine

## 2015-03-03 ENCOUNTER — Telehealth: Payer: Self-pay | Admitting: Internal Medicine

## 2015-03-03 DIAGNOSIS — R930 Abnormal findings on diagnostic imaging of skull and head, not elsewhere classified: Secondary | ICD-10-CM | POA: Insufficient documentation

## 2015-03-03 NOTE — Telephone Encounter (Signed)
PT was rescheduled to 04/23/15 with Dr Melvyn Novas.

## 2015-03-03 NOTE — Progress Notes (Signed)
Quick Note:  LMTCB ______ 

## 2015-03-03 NOTE — Progress Notes (Signed)
Quick Note:  Spoke with pt and notified of results per Dr. Wert. Pt verbalized understanding and denied any questions.  ______ 

## 2015-03-04 ENCOUNTER — Other Ambulatory Visit: Payer: Self-pay | Admitting: Geriatric Medicine

## 2015-03-04 MED ORDER — VALACYCLOVIR HCL 500 MG PO TABS: 1000.0000 mg | ORAL_TABLET | Freq: Three times a day (TID) | ORAL | Status: DC | PRN

## 2015-03-05 ENCOUNTER — Encounter: Payer: Self-pay | Admitting: Internal Medicine

## 2015-03-05 NOTE — Assessment & Plan Note (Signed)
Reminded to use mucinex dm and flutter valve appropriately - see avs

## 2015-03-05 NOTE — Assessment & Plan Note (Signed)
-   PFTs 06/10/08  FEV1 1.73 (61%) ratio 55 with ERV 232 and DLCO 87%  - 07/02/2014 try symbicort 160 2bid and stop all powders   Still having flares though baseline is much improved on the symbicort 160 - before adding spiriva as per guidlelines need to review the other causes. DDX of  difficult airways management almost all start with A and  include Adherence, Ace Inhibitors, Acid Reflux, Active Sinus Disease, Alpha 1 Antitripsin deficiency, Anxiety masquerading as Airways dz,  ABPA,  Allergy(esp in young), Aspiration (esp in elderly), Adverse effects of meds,  Active smokers, A bunch of PE's (a small clot burden can't cause this syndrome unless there is already severe underlying pulm or vascular dz with poor reserve) plus two Bs  = Bronchiectasis and Beta blocker use..and one C= CHF    Adherence is always the initial "prime suspect" and is a multilayered concern that requires a "trust but verify" approach in every patient - starting with knowing how to use medications, especially inhalers, correctly, keeping up with refills and understanding the fundamental difference between maintenance and prns vs those medications only taken for a very short course and then stopped and not refilled.  - The proper method of use, as well as anticipated side effects, of a metered-dose inhaler are discussed and demonstrated to the patient. Improved effectiveness after extensive coaching during this visit to a level of approximately 75 % from a baseline of 50 % so still needs work  ? Acid (or non-acid) GERD > always difficult to exclude as up to 75% of pts in some series report no assoc GI/ Heartburn symptoms and note present flare occurred p d/c'd > rec max (24h)  acid suppression and diet restrictions/ reviewed and instructions given in writing.   ? Active sinus dz > augmentin x 10 days and sinus Ct   ? Active smoking > reinforced critical importance of maintaining abstinence  I had an extended discussion with the  patient reviewing all relevant studies completed to date and  lasting 15 to 20 minutes of a 25 minute visit    Each maintenance medication was reviewed in detail including most importantly the difference between maintenance and prns and under what circumstances the prns are to be triggered using an action plan format that is not reflected in the computer generated alphabetically organized AVS.    Please see instructions for details which were reviewed in writing and the patient given a copy highlighting the part that I personally wrote and discussed at today's ov.

## 2015-03-06 ENCOUNTER — Ambulatory Visit (HOSPITAL_COMMUNITY)
Admission: RE | Admit: 2015-03-06 | Discharge: 2015-03-06 | Disposition: A | Discharge status: Home / Self Care | Payer: Medicare HMO | Source: Ambulatory Visit | Attending: Internal Medicine | Admitting: Internal Medicine

## 2015-03-06 DIAGNOSIS — R270 Ataxia, unspecified: Secondary | ICD-10-CM | POA: Diagnosis not present

## 2015-03-06 DIAGNOSIS — I10 Essential (primary) hypertension: Secondary | ICD-10-CM | POA: Diagnosis not present

## 2015-03-06 DIAGNOSIS — R42 Dizziness and giddiness: Secondary | ICD-10-CM | POA: Insufficient documentation

## 2015-03-06 DIAGNOSIS — R413 Other amnesia: Secondary | ICD-10-CM | POA: Diagnosis not present

## 2015-03-06 DIAGNOSIS — I6523 Occlusion and stenosis of bilateral carotid arteries: Secondary | ICD-10-CM | POA: Insufficient documentation

## 2015-03-06 DIAGNOSIS — Z87891 Personal history of nicotine dependence: Secondary | ICD-10-CM | POA: Insufficient documentation

## 2015-03-06 DIAGNOSIS — I2510 Atherosclerotic heart disease of native coronary artery without angina pectoris: Secondary | ICD-10-CM | POA: Insufficient documentation

## 2015-03-09 ENCOUNTER — Ambulatory Visit (INDEPENDENT_AMBULATORY_CARE_PROVIDER_SITE_OTHER): Payer: Medicare HMO | Admitting: Neurology

## 2015-03-09 ENCOUNTER — Encounter: Payer: Self-pay | Admitting: Neurology

## 2015-03-09 ENCOUNTER — Telehealth: Payer: Self-pay | Admitting: Internal Medicine

## 2015-03-09 VITALS — BP 132/60 | HR 76 | Wt 171.0 lb

## 2015-03-09 DIAGNOSIS — F0391 Unspecified dementia with behavioral disturbance: Secondary | ICD-10-CM | POA: Diagnosis not present

## 2015-03-09 DIAGNOSIS — F329 Major depressive disorder, single episode, unspecified: Secondary | ICD-10-CM | POA: Insufficient documentation

## 2015-03-09 DIAGNOSIS — F32A Depression, unspecified: Secondary | ICD-10-CM

## 2015-03-09 DIAGNOSIS — F03B18 Unspecified dementia, moderate, with other behavioral disturbance: Secondary | ICD-10-CM | POA: Insufficient documentation

## 2015-03-09 MED ORDER — DONEPEZIL HCL 10 MG PO TABS: 10.0000 mg | ORAL_TABLET | Freq: Every day | ORAL | Status: DC

## 2015-03-09 NOTE — Telephone Encounter (Signed)
They can try 1/2 pill of the remeron (mirtazepine) at bedtime.

## 2015-03-09 NOTE — Patient Instructions (Addendum)
1. Increase Aricept to 10mg  daily. After a month, start Namzaric (Aricept + Namenda) starter pack. Stop the Aricept and take Namzaric instead. Call our office after starting Namzaric, if tolerating and no side effects, we will send in prescription for Namzaric.  2. Discuss depression with your PCP 3. Physical exercise and brain stimulation exercises are important for brain health 4. Continue 24/7 care and no driving 5. Follow-up in 1 year

## 2015-03-09 NOTE — Telephone Encounter (Signed)
Patient's wife will start giving the patient 1/2 pill of the remeron at night. She will let us know how it works.

## 2015-03-09 NOTE — Progress Notes (Signed)
NEUROLOGY CONSULTATION NOTE  Chad [name] MRN: [id number] DOB: Feb 25, 1929  Referring provider: Dr. Pricilla Holm Primary care provider: Dr. Pricilla Holm  Reason for consult:  Memory loss  Dear Dr Sharlet Salina:  Thank you for your kind referral of Chad [name] for consultation of the above symptoms. Although his history is well known to you, please allow me to reiterate it for the purpose of our medical record. The patient was accompanied to the clinic by his wife who also provides collateral information. Records and images were personally reviewed where available.  HISTORY OF PRESENT ILLNESS: This is an 82 year old right-handed man with a history of hypertension, diabetes, CAD, esophageal stenosis s/p surgery, presenting for evaluation of worsening memory. He reports his memory is "fine," but does agree that he forgets names, conversations. His wife reports that he had esophageal surgery in 2007 for "precancerous cells," then a month later had hernia repair that led to a colectomy. She started noticing memory changes after the first surgery. In 2012, he had bronchitis and apparently had an allergic reaction to Avelox ("turned purple"), and memory changes became even more evident. He repeats the same questions 4 to 5 times. Sometimes he would call for his wife repeatedly, then does not say why. His wife started making sure he takes his medications since 2014, and now fixes his piillbox. He would not eat if she did not remind him to. She has been helping him dress for the past 2 years. He is able to feed himself, but a few hours later forgets he had eaten and would ask again when they would eat. 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 His wife of 14 years has always been in charge of bill payments. She denies any paranoia,  but has noticed he gets irritable when she asks him to do something and he just sits there and she tries to help him. There is no family history of dementia. He denies any history of significant head injury, no alcohol intake. He was started on Aricept 5mg  daily in early 2016 with no side effects.   He became tearful at the end of the visit. His wife reports depression after the initial surgery, but recently has been having crying spells. He was started on Zoloft and started having episodes his wife was calling "seizures" where his legs would lock up and he would be unable to stand, he would have a blank look in his eyes and would not respond to her for a few seconds. He started having hand tremors. With discontinuation of Zoloft, these episodes had stopped. No prior history of seizures or family history of seizures. He was started on Remeron but is too sleepy on it. He has orthostatic hypotension with dizziness upon standing. He denies any headaches, vision changes, dysarthria/dysphagia, neck/back pain, bowel/bladder dysfunction, anosmia, tremors (stopped with Zoloft discontinuation). He had TIAs in 1998 where he would be unable to move an arm or leg, and underwent bilateral carotid endarterectomy.   Diagnostic Data: I personally reviewed head CT without contrast done 05/15/14 which did not show any acute changes. There was moderate cerebral atrophy with progression from exam in 2008. There was mild chronic microvascular disease. TSH normal, B12 low at 171, he is taking daily B12 supplements  Lab Results  Component Value Date  WBC 8.2 09/15/2014   HGB 12.2* 09/15/2014   HCT 37.7* 09/15/2014   MCV 80.4 09/15/2014   PLT 223.0 09/15/2014     Chemistry      Component Value Date/Time   NA 140 12/24/2014 0855   K 4.5 12/24/2014 0855   CL 101 12/24/2014 0855   CO2 31 12/24/2014 0855   BUN 21 12/24/2014 0855   CREATININE 1.27 12/24/2014 0855      Component Value Date/Time   CALCIUM 10.2 12/24/2014  0855   ALKPHOS 74 12/24/2014 0855   AST 12 12/24/2014 0855   ALT 14 12/24/2014 0855   BILITOT 0.5 12/24/2014 0855     Lab Results  Component Value Date   TSH 1.90 02/18/2014   Lab Results  Component Value Date   VITAMINB12 171* 02/18/2014     PAST MEDICAL HISTORY: Past Medical History  Diagnosis Date  . Fatty liver disease, nonalcoholic   . Coronary artery stenosis   . Diverticulosis of colon   . Diaphragmatic hernia without mention of obstruction or gangrene   . Dyspnea   . Personal history of other malignant neoplasm of skin   . Barrett's esophagus   . TIA (transient ischemic attack) 1998 X "a few"  . Hyperlipidemia   . Acid reflux disease   . Glaucoma   . HTN (hypertension)   . RENAL CALCULUS, HX OF 08/28/2008  . CAROTID ARTERY STENOSIS, BILATERAL 08/27/2008  . Esophageal stenosis   . COPD (chronic obstructive pulmonary disease) ([place])     "severe" (12/20/2013)  . Chronic bronchitis ([place])     "most q yr; he's had it several times" (12/20/2013)  . Pneumonia 1990's X 4  . Type II diabetes mellitus ([place])   . History of blood transfusion 2007    "related to OR"  . History of hiatal hernia     "repaired when esophagus removed"  . History of stomach ulcers   . Sinus headache     "seasonal"  . Arthritis     "all over"  . Kidney stones     "passed them all" (12/20/2013)  . Basal cell carcinoma of nose     PAST SURGICAL HISTORY: Past Surgical History  Procedure Laterality Date  . Esophagectomy  2007    with stomach pull through  . Pars plana vitrectomy Bilateral 01/2007-05/2007  . Ventral hernia repair  02/2006  . Knee arthroscopy Bilateral 04/1989; 08/1996; 06/1999    "right; left; left"  . Carotid endarterectomy Bilateral 02/1996-03/1996  . Shoulder arthroscopy Right 04/1998  . Mohs surgery  ?2002    "tip of nose; basal cell"  . Nasal polyp excision  2005    "benign"  . Hernia repair    . Cataract extraction w/ intraocular lens implant Bilateral 04/2007 - 09/2007     "left-right"  . Glaucoma surgery Right 11/2008    "implanted drain tube"  . Esophagogastroduodenoscopy (egd) with esophageal dilation  10/2011  . Eye surgery    . Eye surgery Right 04/2006; 08/2007    "had gas bubbles put in"  . Eye surgery Right 01/2007    "air bubble"  . Eye surgery Left 12/12/2013    "cleaned his implant w/laser"    MEDICATIONS: Current Outpatient Prescriptions on File Prior to Visit  Medication Sig Dispense Refill  . amLODipine (NORVASC) 5 MG tablet Take 1 tablet (5 mg total) by mouth daily. 30 tablet 6  . aspirin 81 MG tablet Take 81 mg by mouth daily.      Marland Kitchen  brimonidine (ALPHAGAN P) 0.1 % SOLN Place 1 drop into the left eye 2 (two) times daily.      . budesonide-formoterol (SYMBICORT) 160-4.5 MCG/ACT inhaler Take 2 puffs first thing in am and then another 2 puffs about 12 hours later. 1 Inhaler 11  . Cholecalciferol (VITAMIN D) 1000 UNITS capsule Take 1,000 Units by mouth daily.      . cloNIDine (CATAPRES) 0.2 MG tablet 1 tab by mouth as needed when BP is over 140     . donepezil (ARICEPT) 5 MG tablet TAKE 1 TABLET BY MOUTH DAILY 90 tablet 3  . losartan (COZAAR) 100 MG tablet Take 1 tablet (100 mg total) by mouth daily. 90 tablet 3  . metFORMIN (GLUCOPHAGE) 500 MG tablet Take 1 tablet (500 mg total) by mouth daily with breakfast. 90 tablet 3  . mirtazapine (REMERON) 15 MG tablet Take 1 tablet (15 mg total) by mouth at bedtime. 30 tablet 3  . omeprazole (PRILOSEC) 20 MG capsule Take 40 mg by mouth 2 (two) times daily.     . vitamin B-12 (CYANOCOBALAMIN) 1000 MCG tablet Take 1 tablet (1,000 mcg total) by mouth daily. (Patient taking differently: Take 2000 mg daily) 90 tablet 3  . albuterol (PROAIR HFA) 108 (90 BASE) MCG/ACT inhaler Inhale 1-2 puffs into the lungs every 6 (six) hours as needed. Reported on 03/09/2015    . albuterol (PROVENTIL) (2.5 MG/3ML) 0.083% nebulizer solution 1 vial in nebulizer every 6 hours and as needed for wheezing/shortness of breath (Patient  not taking: Reported on 03/09/2015) 120 vial 11  . dextromethorphan-guaiFENesin (MUCINEX DM) 30-600 MG 12hr tablet Take 1 tablet by mouth 2 (two) times daily. Reported on 03/09/2015    . diazepam (VALIUM) 5 MG tablet Take 2.5 mg by mouth daily as needed (dizziness, inner ear). Reported on 03/09/2015    . HYDROcodone-homatropine (HYCODAN) 5-1.5 MG/5ML syrup Take 5 mLs by mouth every 6 (six) hours as needed for cough. (Patient not taking: Reported on 03/09/2015) 120 mL 0  . promethazine (PHENERGAN) 25 MG tablet Reported on 03/09/2015    . valACYclovir (VALTREX) 500 MG tablet Take 2 tablets (1,000 mg total) by mouth every 8 (eight) hours as needed. For out breaks (Patient not taking: Reported on 03/09/2015) 30 tablet 1   No current facility-administered medications on file prior to visit.    ALLERGIES: Allergies  Allergen Reactions  . Moxifloxacin Anaphylaxis    Avelox REACTION: anaphylaxis  . Penicillins     REACTION: hives  . Sulfonamide Derivatives     REACTION: hives  . Timolol Maleate     Causes severe chest congestion    FAMILY HISTORY: Family History  Problem Relation Age of Onset  . Colon cancer Mother   . Lung cancer Mother   . Heart attack Mother   . Colon cancer Maternal Grandfather   . Heart attack Brother   . Heart attack Brother   . Stomach cancer Father   . Esophageal cancer Father     SOCIAL HISTORY: Social History   Social History  . Marital Status: Married    Spouse Name: N/A  . Number of Children: 2  . Years of Education: N/A   Occupational History  . retired Automotive engineer    Social History Main Topics  . Smoking status: Former Smoker -- 1.00 packs/day for 40 years    Types: Cigarettes    Quit date: 01/24/1978  . Smokeless tobacco: Former Systems developer  . Alcohol Use: No  . Drug Use: No  .  Sexual Activity: Not Currently   Other Topics Concern  . Not on file   Social History Narrative    REVIEW OF SYSTEMS: Constitutional: No fevers, chills, or  sweats, no generalized fatigue, change in appetite Eyes: No visual changes, double vision, eye pain Ear, nose and throat: No hearing loss, ear pain, nasal congestion, sore throat Cardiovascular: No chest pain, palpitations Respiratory:  No shortness of breath at rest or with exertion, wheezes GastrointestinaI: No nausea, vomiting, diarrhea, abdominal pain, fecal incontinence Genitourinary:  No dysuria, urinary retention or frequency Musculoskeletal:  No neck pain, back pain Integumentary: No rash, pruritus, skin lesions Neurological: as above Psychiatric: +depression, no insomnia, anxiety Endocrine: No palpitations, fatigue, diaphoresis, mood swings, change in appetite, change in weight, increased thirst Hematologic/Lymphatic:  No anemia, purpura, petechiae. Allergic/Immunologic: no itchy/runny eyes, nasal congestion, recent allergic reactions, rashes  PHYSICAL EXAM: Filed Vitals:   03/09/15 1012  BP: 132/60  Pulse: 76   General: No acute distress, flat affect with poor eye contact, hypophonia Head:  Normocephalic/atraumatic Eyes: Fundoscopic exam shows bilateral sharp discs, no vessel changes, exudates, or hemorrhages Neck: supple, no paraspinal tenderness, full range of motion Back: No paraspinal tenderness Heart: regular rate and rhythm Lungs: Clear to auscultation bilaterally. Vascular: No carotid bruits. Skin/Extremities: No rash, no edema Neurological Exam: Mental status: alert and oriented to person, place, season ("Valentine's day), no dysarthria or aphasia, Fund of knowledge is appropriate.  Recent and remote memory are impaired. Attention and concentration are reduced.    Able to name objects and repeat phrases. CDT 1/5 MMSE - Mini Mental State Exam 03/09/2015  Orientation to time 1  Orientation to Place 3  Registration 3  Attention/ Calculation 0  Recall 0  Language- name 2 objects 2  Language- repeat 1  Language- follow 3 step command 3  Language- read & follow  direction 1  Write a sentence 0  Copy design 0  Total score 14   Cranial nerves: CN I: not tested CN II: pupils equal, round and reactive to light, visual fields intact, fundi unremarkable. CN III, IV, VI:  full range of motion, no nystagmus, no ptosis CN V: facial sensation intact CN VII: upper and lower face symmetric CN VIII: hearing intact to finger rub CN IX, X: gag intact, uvula midline CN XI: sternocleidomastoid and trapezius muscles intact CN XII: tongue midline Bulk & Tone: normal, no cogwheeling, no fasciculations. Motor: 5/5 throughout with no pronator drift. Sensation: intact to light touch, cold, pin, vibration and joint position sense.  No extinction to double simultaneous stimulation.  Romberg test negative Deep Tendon Reflexes: +1 throughout, no ankle clonus Plantar responses: downgoing bilaterally Cerebellar: no incoordination on finger to nose testing Gait: narrow-based and steady, good arm swing, mild difficulty with tandem walk but able Tremor: none  IMPRESSION: This is an 82 year old right-handed man with a history of hypertension, diabetes, CAD, presenting with worsening memory loss since 2007 or so. His MMSE today is 14/30, indicating moderate dementia, likely Alzheimer's type. Diagnosis and management discussed with wife, increase Aricept to 10mg  daily and monitor for any side effects. If tolerating well after a month, she was given a sample titration pack for Namzaric and will stop Aricept and give the combination Aricept-Namenda pills instead. If tolerating, a prescription for Namzaric will be sent. Side effects were discussed. We discussed the importance of physical exercise and brain stimulation exercises for brain health. He currently is under 24/7 care, recommend continuation of this and no driving. He became  tearful today in the office, we discussed effects of mood on memory, he was recently started on Remeron and will discuss side effects with PCP. We  discussed that the episodes he was having on Zoloft sounded more like side effects rather than seizures, no further episodes since starting Zoloft. If these recur, an EEG will be ordered. He will follow-up in 1 year or earlier if needed. .  Thank you for allowing me to participate in the care of this patient. Please do not hesitate to call for any questions or concerns.   Chad [name], M.D.  CC: Dr. Sharlet Salina

## 2015-03-09 NOTE — Telephone Encounter (Signed)
States patient is taking mirtazapine 15mg .  States all patient does is sleep.  States patient went to neurologist and was told they needed to reach out to Dr. Sharlet Salina about reducing the dosage. States patient was diagnosised with moderate dementia and upped aricept to 10mg .  Needs to know what to do about antidepressant.

## 2015-03-10 DIAGNOSIS — Z961 Presence of intraocular lens: Secondary | ICD-10-CM | POA: Insufficient documentation

## 2015-03-10 DIAGNOSIS — H35039 Hypertensive retinopathy, unspecified eye: Secondary | ICD-10-CM | POA: Insufficient documentation

## 2015-03-11 ENCOUNTER — Inpatient Hospital Stay (HOSPITAL_COMMUNITY)
Admission: EM | Admit: 2015-03-11 | Discharge: 2015-03-15 | DRG: 871 | Disposition: A | Discharge status: Home / Self Care | Payer: Medicare HMO | Attending: Internal Medicine | Admitting: Internal Medicine

## 2015-03-11 ENCOUNTER — Emergency Department (HOSPITAL_COMMUNITY): Payer: Medicare HMO

## 2015-03-11 ENCOUNTER — Encounter (HOSPITAL_COMMUNITY): Payer: Self-pay | Admitting: Emergency Medicine

## 2015-03-11 ENCOUNTER — Telehealth: Payer: Self-pay | Admitting: Internal Medicine

## 2015-03-11 DIAGNOSIS — E119 Type 2 diabetes mellitus without complications: Secondary | ICD-10-CM

## 2015-03-11 DIAGNOSIS — Z8673 Personal history of transient ischemic attack (TIA), and cerebral infarction without residual deficits: Secondary | ICD-10-CM

## 2015-03-11 DIAGNOSIS — Z85828 Personal history of other malignant neoplasm of skin: Secondary | ICD-10-CM | POA: Diagnosis not present

## 2015-03-11 DIAGNOSIS — Y95 Nosocomial condition: Secondary | ICD-10-CM | POA: Diagnosis present

## 2015-03-11 DIAGNOSIS — I2510 Atherosclerotic heart disease of native coronary artery without angina pectoris: Secondary | ICD-10-CM | POA: Diagnosis present

## 2015-03-11 DIAGNOSIS — Z7982 Long term (current) use of aspirin: Secondary | ICD-10-CM | POA: Diagnosis not present

## 2015-03-11 DIAGNOSIS — F0390 Unspecified dementia without behavioral disturbance: Secondary | ICD-10-CM | POA: Diagnosis present

## 2015-03-11 DIAGNOSIS — J449 Chronic obstructive pulmonary disease, unspecified: Secondary | ICD-10-CM | POA: Diagnosis not present

## 2015-03-11 DIAGNOSIS — F0391 Unspecified dementia with behavioral disturbance: Secondary | ICD-10-CM | POA: Diagnosis present

## 2015-03-11 DIAGNOSIS — Z7984 Long term (current) use of oral hypoglycemic drugs: Secondary | ICD-10-CM | POA: Diagnosis not present

## 2015-03-11 DIAGNOSIS — J181 Lobar pneumonia, unspecified organism: Secondary | ICD-10-CM

## 2015-03-11 DIAGNOSIS — J440 Chronic obstructive pulmonary disease with acute lower respiratory infection: Secondary | ICD-10-CM | POA: Diagnosis present

## 2015-03-11 DIAGNOSIS — Z87891 Personal history of nicotine dependence: Secondary | ICD-10-CM | POA: Diagnosis not present

## 2015-03-11 DIAGNOSIS — N179 Acute kidney failure, unspecified: Secondary | ICD-10-CM | POA: Diagnosis present

## 2015-03-11 DIAGNOSIS — E785 Hyperlipidemia, unspecified: Secondary | ICD-10-CM | POA: Diagnosis present

## 2015-03-11 DIAGNOSIS — I10 Essential (primary) hypertension: Secondary | ICD-10-CM | POA: Diagnosis present

## 2015-03-11 DIAGNOSIS — A419 Sepsis, unspecified organism: Principal | ICD-10-CM | POA: Diagnosis present

## 2015-03-11 DIAGNOSIS — E860 Dehydration: Secondary | ICD-10-CM | POA: Diagnosis present

## 2015-03-11 DIAGNOSIS — J189 Pneumonia, unspecified organism: Secondary | ICD-10-CM | POA: Diagnosis present

## 2015-03-11 DIAGNOSIS — I959 Hypotension, unspecified: Secondary | ICD-10-CM | POA: Diagnosis present

## 2015-03-11 DIAGNOSIS — F03B18 Unspecified dementia, moderate, with other behavioral disturbance: Secondary | ICD-10-CM | POA: Diagnosis present

## 2015-03-11 DIAGNOSIS — E1165 Type 2 diabetes mellitus with hyperglycemia: Secondary | ICD-10-CM | POA: Diagnosis present

## 2015-03-11 DIAGNOSIS — K222 Esophageal obstruction: Secondary | ICD-10-CM | POA: Diagnosis present

## 2015-03-11 LAB — URINALYSIS, ROUTINE W REFLEX MICROSCOPIC
Bilirubin Urine: NEGATIVE
GLUCOSE, UA: NEGATIVE mg/dL
HGB URINE DIPSTICK: NEGATIVE
KETONES UR: NEGATIVE mg/dL
Leukocytes, UA: NEGATIVE
Nitrite: NEGATIVE
PROTEIN: NEGATIVE mg/dL
Specific Gravity, Urine: 1.013 (ref 1.005–1.030)
pH: 6 (ref 5.0–8.0)

## 2015-03-11 LAB — CBC WITH DIFFERENTIAL/PLATELET
BASOS ABS: 0 10*3/uL (ref 0.0–0.1)
BASOS PCT: 0 %
EOS ABS: 0.1 10*3/uL (ref 0.0–0.7)
Eosinophils Relative: 1 %
HCT: 39.7 % (ref 39.0–52.0)
Hemoglobin: 12.1 g/dL — ABNORMAL LOW (ref 13.0–17.0)
Lymphocytes Relative: 7 %
Lymphs Abs: 1.2 10*3/uL (ref 0.7–4.0)
MCH: 25.6 pg — AB (ref 26.0–34.0)
MCHC: 30.5 g/dL (ref 30.0–36.0)
MCV: 84.1 fL (ref 78.0–100.0)
MONOS PCT: 8 %
Monocytes Absolute: 1.3 10*3/uL — ABNORMAL HIGH (ref 0.1–1.0)
NEUTROS PCT: 84 %
Neutro Abs: 13.7 10*3/uL — ABNORMAL HIGH (ref 1.7–7.7)
PLATELETS: 200 10*3/uL (ref 150–400)
RBC: 4.72 MIL/uL (ref 4.22–5.81)
RDW: 17.9 % — AB (ref 11.5–15.5)
WBC MORPHOLOGY: INCREASED
WBC: 16.4 10*3/uL — ABNORMAL HIGH (ref 4.0–10.5)

## 2015-03-11 LAB — I-STAT CG4 LACTIC ACID, ED
LACTIC ACID, VENOUS: 3.05 mmol/L — AB (ref 0.5–2.0)
LACTIC ACID, VENOUS: 1.94 mmol/L (ref 0.5–2.0)

## 2015-03-11 LAB — COMPREHENSIVE METABOLIC PANEL
ALT: 13 U/L — ABNORMAL LOW (ref 17–63)
ANION GAP: 13 (ref 5–15)
AST: 15 U/L (ref 15–41)
Albumin: 3.5 g/dL (ref 3.5–5.0)
Alkaline Phosphatase: 63 U/L (ref 38–126)
BILIRUBIN TOTAL: 0.7 mg/dL (ref 0.3–1.2)
BUN: 20 mg/dL (ref 6–20)
CHLORIDE: 103 mmol/L (ref 101–111)
CO2: 23 mmol/L (ref 22–32)
Calcium: 10.2 mg/dL (ref 8.9–10.3)
Creatinine, Ser: 1.67 mg/dL — ABNORMAL HIGH (ref 0.61–1.24)
GFR, EST AFRICAN AMERICAN: 42 mL/min — AB (ref >60)
GFR, EST NON AFRICAN AMERICAN: 36 mL/min — AB (ref >60)
Glucose, Bld: 281 mg/dL — ABNORMAL HIGH (ref 65–99)
POTASSIUM: 4.7 mmol/L (ref 3.5–5.1)
Sodium: 139 mmol/L (ref 135–145)
TOTAL PROTEIN: 6.7 g/dL (ref 6.5–8.1)

## 2015-03-11 LAB — LACTIC ACID, PLASMA
LACTIC ACID, VENOUS: 2 mmol/L (ref 0.5–2.0)
LACTIC ACID, VENOUS: 2.7 mmol/L — AB (ref 0.5–2.0)

## 2015-03-11 LAB — PROTIME-INR
INR: 1.08 (ref 0.00–1.49)
Prothrombin Time: 14.2 seconds (ref 11.6–15.2)

## 2015-03-11 LAB — INFLUENZA PANEL BY PCR (TYPE A & B)
H1N1FLUPCR: NOT DETECTED
Influenza A By PCR: NEGATIVE
Influenza B By PCR: NEGATIVE

## 2015-03-11 LAB — APTT: aPTT: 33 seconds (ref 24–37)

## 2015-03-11 LAB — MRSA PCR SCREENING: MRSA by PCR: NEGATIVE

## 2015-03-11 LAB — GLUCOSE, CAPILLARY: Glucose-Capillary: 166 mg/dL — ABNORMAL HIGH (ref 65–99)

## 2015-03-11 LAB — PROCALCITONIN: Procalcitonin: 0.14 ng/mL

## 2015-03-11 LAB — STREP PNEUMONIAE URINARY ANTIGEN: Strep Pneumo Urinary Antigen: NEGATIVE

## 2015-03-11 MED ORDER — ASPIRIN 81 MG PO CHEW
81.0000 mg | CHEWABLE_TABLET | Freq: Every day | ORAL | Status: DC
  Administered 2015-03-11 – 2015-03-15 (×5): 81 mg via ORAL
  Filled 2015-03-11 (×6): qty 1

## 2015-03-11 MED ORDER — BRIMONIDINE TARTRATE 0.2 % OP SOLN
1.0000 [drp] | Freq: Two times a day (BID) | OPHTHALMIC | Status: DC
  Administered 2015-03-11 – 2015-03-15 (×8): 1 [drp] via OPHTHALMIC
  Filled 2015-03-11 (×2): qty 5

## 2015-03-11 MED ORDER — HYDRALAZINE HCL 20 MG/ML IJ SOLN: 10.0000 mg | Freq: Four times a day (QID) | INTRAMUSCULAR | Status: DC | PRN

## 2015-03-11 MED ORDER — SODIUM CHLORIDE 0.9 % IV BOLUS (SEPSIS)
1000.0000 mL | INTRAVENOUS | Status: AC
  Administered 2015-03-11 (×2): 1000 mL via INTRAVENOUS

## 2015-03-11 MED ORDER — VANCOMYCIN HCL 10 G IV SOLR
1250.0000 mg | INTRAVENOUS | Status: DC
  Administered 2015-03-12 – 2015-03-13 (×2): 1250 mg via INTRAVENOUS
  Filled 2015-03-11 (×2): qty 1250

## 2015-03-11 MED ORDER — DEXTROSE 5 % IV SOLN
1.0000 g | Freq: Once | INTRAVENOUS | Status: AC
  Administered 2015-03-11: 1 g via INTRAVENOUS
  Filled 2015-03-11 (×2): qty 1

## 2015-03-11 MED ORDER — IPRATROPIUM-ALBUTEROL 0.5-2.5 (3) MG/3ML IN SOLN: 3.0000 mL | Freq: Four times a day (QID) | RESPIRATORY_TRACT | Status: DC | PRN

## 2015-03-11 MED ORDER — SODIUM CHLORIDE 0.9 % IV BOLUS (SEPSIS)
500.0000 mL | Freq: Once | INTRAVENOUS | Status: AC
  Administered 2015-03-11: 500 mL via INTRAVENOUS

## 2015-03-11 MED ORDER — PANTOPRAZOLE SODIUM 40 MG PO TBEC
40.0000 mg | DELAYED_RELEASE_TABLET | Freq: Every day | ORAL | Status: DC
  Administered 2015-03-11 – 2015-03-15 (×5): 40 mg via ORAL
  Filled 2015-03-11 (×6): qty 1

## 2015-03-11 MED ORDER — VANCOMYCIN HCL 10 G IV SOLR
1500.0000 mg | Freq: Once | INTRAVENOUS | Status: AC
  Administered 2015-03-11: 1500 mg via INTRAVENOUS
  Filled 2015-03-11: qty 1500

## 2015-03-11 MED ORDER — INSULIN ASPART 100 UNIT/ML ~~LOC~~ SOLN
0.0000 [IU] | Freq: Three times a day (TID) | SUBCUTANEOUS | Status: DC
  Administered 2015-03-12: 2 [IU] via SUBCUTANEOUS

## 2015-03-11 MED ORDER — DONEPEZIL HCL 10 MG PO TABS
10.0000 mg | ORAL_TABLET | Freq: Every morning | ORAL | Status: DC
  Administered 2015-03-12 – 2015-03-15 (×4): 10 mg via ORAL
  Filled 2015-03-11 (×4): qty 1

## 2015-03-11 MED ORDER — DM-GUAIFENESIN ER 30-600 MG PO TB12
1.0000 | ORAL_TABLET | Freq: Two times a day (BID) | ORAL | Status: DC
  Administered 2015-03-11 – 2015-03-15 (×8): 1 via ORAL
  Filled 2015-03-11 (×8): qty 1

## 2015-03-11 MED ORDER — MIRTAZAPINE 15 MG PO TABS
7.5000 mg | ORAL_TABLET | Freq: Every day | ORAL | Status: DC
  Administered 2015-03-11 – 2015-03-14 (×4): 7.5 mg via ORAL
  Filled 2015-03-11 (×4): qty 1

## 2015-03-11 MED ORDER — BUDESONIDE-FORMOTEROL FUMARATE 160-4.5 MCG/ACT IN AERO
2.0000 | INHALATION_SPRAY | Freq: Two times a day (BID) | RESPIRATORY_TRACT | Status: DC
  Administered 2015-03-11 – 2015-03-15 (×8): 2 via RESPIRATORY_TRACT
  Filled 2015-03-11 (×2): qty 6

## 2015-03-11 MED ORDER — SODIUM CHLORIDE 0.9 % IV SOLN
INTRAVENOUS | Status: AC
  Administered 2015-03-11 – 2015-03-12 (×2): via INTRAVENOUS

## 2015-03-11 MED ORDER — VANCOMYCIN HCL IN DEXTROSE 1-5 GM/200ML-% IV SOLN
1000.0000 mg | Freq: Once | INTRAVENOUS | Status: DC
  Filled 2015-03-11: qty 200

## 2015-03-11 MED ORDER — DEXTROSE 5 % IV SOLN
1.0000 g | Freq: Once | INTRAVENOUS | Status: AC
  Administered 2015-03-11: 1 g via INTRAVENOUS
  Filled 2015-03-11: qty 1

## 2015-03-11 MED ORDER — SODIUM CHLORIDE 0.9 % IV BOLUS (SEPSIS)
500.0000 mL | INTRAVENOUS | Status: AC
  Administered 2015-03-11: 500 mL via INTRAVENOUS

## 2015-03-11 NOTE — Progress Notes (Signed)
CRITICAL VALUE ALERT  Critical value received:  2.7  Date of notification:  03/11/15  Time of notification:  2150  Critical value read back: yes  Nurse who received alert:  Remo Lipps, RN  MD notified (1st page):  [facility] paged  Time of first page:  2150  Orders in place

## 2015-03-11 NOTE — ED Provider Notes (Signed)
CSN: [id number]     Arrival date & time 03/11/15  0940 History   First MD Initiated Contact with Patient 03/11/15 1016     Chief Complaint  Patient presents with  . Fever     (Consider location/radiation/quality/duration/timing/severity/associated sxs/prior Treatment) HPI Comments: 82yo M w/ extensive PMH including dementia, CAD, COPD, T2DM, TIA who p/w fever. Hx obtained from wife. She states he finished abx a few days ago for sinus infection. He also finished steroids recently for COPD. This morning she checked his temperature and it was 102. She gave Tylenol this morning. He was generally weak this morning and she had to assist him out of bed. He has not had any recent vomiting, diarrhea, significant cough, or complaints of pain. He denies any dysuria, chest pain, or shortness of breath.  LEVEL 5 CAVEAT 2/2 DEMENTIA  Patient is a 82 y.o. male presenting with fever. The history is provided by the spouse.  Fever   Past Medical History  Diagnosis Date  . Fatty liver disease, nonalcoholic   . Coronary artery stenosis   . Diverticulosis of colon   . Diaphragmatic hernia without mention of obstruction or gangrene   . Dyspnea   . Personal history of other malignant neoplasm of skin   . Barrett's esophagus   . TIA (transient ischemic attack) 1998 X "a few"  . Hyperlipidemia   . Acid reflux disease   . Glaucoma   . HTN (hypertension)   . RENAL CALCULUS, HX OF 08/28/2008  . CAROTID ARTERY STENOSIS, BILATERAL 08/27/2008  . Esophageal stenosis   . COPD (chronic obstructive pulmonary disease) ([place])     "severe" (12/20/2013)  . Chronic bronchitis ([place])     "most q yr; he's had it several times" (12/20/2013)  . Pneumonia 1990's X 4  . Type II diabetes mellitus ([place])   . History of blood transfusion 2007    "related to OR"  . History of hiatal hernia     "repaired when esophagus removed"  . History of stomach ulcers   . Sinus headache     "seasonal"  . Arthritis     "all over"  .  Kidney stones     "passed them all" (12/20/2013)  . Basal cell carcinoma of nose    Past Surgical History  Procedure Laterality Date  . Esophagectomy  2007    with stomach pull through  . Pars plana vitrectomy Bilateral 01/2007-05/2007  . Ventral hernia repair  02/2006  . Knee arthroscopy Bilateral 04/1989; 08/1996; 06/1999    "right; left; left"  . Carotid endarterectomy Bilateral 02/1996-03/1996  . Shoulder arthroscopy Right 04/1998  . Mohs surgery  ?2002    "tip of nose; basal cell"  . Nasal polyp excision  2005    "benign"  . Hernia repair    . Cataract extraction w/ intraocular lens implant Bilateral 04/2007 - 09/2007    "left-right"  . Glaucoma surgery Right 11/2008    "implanted drain tube"  . Esophagogastroduodenoscopy (egd) with esophageal dilation  10/2011  . Eye surgery    . Eye surgery Right 04/2006; 08/2007    "had gas bubbles put in"  . Eye surgery Right 01/2007    "air bubble"  . Eye surgery Left 12/12/2013    "cleaned his implant w/laser"   Family History  Problem Relation Age of Onset  . Colon cancer Mother   . Lung cancer Mother   . Heart attack Mother   . Colon cancer Maternal Grandfather   . Heart  attack Brother   . Heart attack Brother   . Stomach cancer Father   . Esophageal cancer Father    Social History  Substance Use Topics  . Smoking status: Former Smoker -- 1.00 packs/day for 40 years    Types: Cigarettes    Quit date: 01/24/1978  . Smokeless tobacco: Former Systems developer  . Alcohol Use: No    Review of Systems  Unable to perform ROS: Dementia  Constitutional: Positive for fever.      Allergies  Moxifloxacin; Penicillins; Sulfonamide derivatives; and Timolol maleate  Home Medications   Prior to Admission medications   Medication Sig Start Date End Date Taking? Authorizing Provider  albuterol (PROAIR HFA) 108 (90 BASE) MCG/ACT inhaler Inhale 1-2 puffs into the lungs every 6 (six) hours as needed. Reported on 03/09/2015    Historical Provider, MD   albuterol (PROVENTIL) (2.5 MG/3ML) 0.083% nebulizer solution 1 vial in nebulizer every 6 hours and as needed for wheezing/shortness of breath Patient not taking: Reported on 03/09/2015 01/30/15   Tanda Rockers, MD  amLODipine (NORVASC) 5 MG tablet Take 1 tablet (5 mg total) by mouth daily. 04/24/14   Hoyt Koch, MD  aspirin 81 MG tablet Take 81 mg by mouth daily.      Historical Provider, MD  brimonidine (ALPHAGAN P) 0.1 % SOLN Place 1 drop into the left eye 2 (two) times daily.      Historical Provider, MD  budesonide-formoterol (SYMBICORT) 160-4.5 MCG/ACT inhaler Take 2 puffs first thing in am and then another 2 puffs about 12 hours later. 07/02/14   Tanda Rockers, MD  Cholecalciferol (VITAMIN D) 1000 UNITS capsule Take 1,000 Units by mouth daily.      Historical Provider, MD  cloNIDine (CATAPRES) 0.2 MG tablet 1 tab by mouth as needed when BP is over 140     Historical Provider, MD  dextromethorphan-guaiFENesin (MUCINEX DM) 30-600 MG 12hr tablet Take 1 tablet by mouth 2 (two) times daily. Reported on 03/09/2015    Historical Provider, MD  diazepam (VALIUM) 5 MG tablet Take 2.5 mg by mouth daily as needed (dizziness, inner ear). Reported on 03/09/2015    Historical Provider, MD  donepezil (ARICEPT) 10 MG tablet Take 1 tablet (10 mg total) by mouth at bedtime. 03/09/15   Cameron Sprang, MD  furosemide (LASIX) 40 MG tablet Take 40 mg by mouth.    Historical Provider, MD  HYDROcodone-homatropine (HYCODAN) 5-1.5 MG/5ML syrup Take 5 mLs by mouth every 6 (six) hours as needed for cough. Patient not taking: Reported on 03/09/2015 01/22/15   Hoyt Koch, MD  losartan (COZAAR) 100 MG tablet Take 1 tablet (100 mg total) by mouth daily. 04/16/14   Hoyt Koch, MD  metFORMIN (GLUCOPHAGE) 500 MG tablet Take 1 tablet (500 mg total) by mouth daily with breakfast. 12/24/14   Hoyt Koch, MD  mirtazapine (REMERON) 15 MG tablet Take 1 tablet (15 mg total) by mouth at bedtime. 02/19/15    Hoyt Koch, MD  omeprazole (PRILOSEC) 20 MG capsule Take 40 mg by mouth 2 (two) times daily.     Historical Provider, MD  Potassium Gluconate 550 (90 K) MG TABS Take by mouth daily. Take 2 tablets daily    Historical Provider, MD  promethazine (PHENERGAN) 25 MG tablet Reported on 03/09/2015    Historical Provider, MD  valACYclovir (VALTREX) 500 MG tablet Take 2 tablets (1,000 mg total) by mouth every 8 (eight) hours as needed. For out breaks Patient not taking:  Reported on 03/09/2015 03/04/15   Hoyt Koch, MD  vitamin B-12 (CYANOCOBALAMIN) 1000 MCG tablet Take 1 tablet (1,000 mcg total) by mouth daily. Patient taking differently: Take 2000 mg daily 02/26/14   Hoyt Koch, MD   BP 103/46 mmHg  Pulse 90  Temp(Src) 97.9 F (36.6 C) (Oral)  Resp 18  Ht 5\' 9"  (1.753 m)  Wt 180 lb (81.647 kg)  BMI 26.57 kg/m2  SpO2 95% Physical Exam  Constitutional: He appears well-developed and well-nourished. No distress.  HENT:  Head: Normocephalic and atraumatic.  Mouth/Throat: Oropharynx is clear and moist.  Moist mucous membranes  Eyes: Conjunctivae are normal. Pupils are equal, round, and reactive to light.  Neck: Neck supple.  Cardiovascular: Normal rate, regular rhythm and normal heart sounds.   No murmur heard. Pulmonary/Chest: Effort normal and breath sounds normal.  Abdominal: Soft. Bowel sounds are normal. He exhibits no distension. There is no tenderness.  Large central hernia w/ no TTP  Musculoskeletal: He exhibits no edema.  Neurological: He is alert.  Answers questions, follows commands  Skin: Skin is warm and dry. No rash noted.  Psychiatric: He has a normal mood and affect. Judgment normal.  Nursing note and vitals reviewed.   ED Course  .Critical Care Performed by: Sharlett Iles Authorized by: Sharlett Iles Total critical care time: 40 minutes Critical care was necessary to treat or prevent imminent or life-threatening deterioration of  the following conditions: sepsis. Critical care was time spent personally by me on the following activities: development of treatment plan with patient or surrogate, evaluation of patient's response to treatment, examination of patient, obtaining history from patient or surrogate, ordering and performing treatments and interventions, ordering and review of laboratory studies, ordering and review of radiographic studies, review of old charts and re-evaluation of patient's condition.   (including critical care time) Labs Review Labs Reviewed  COMPREHENSIVE METABOLIC PANEL - Abnormal; Notable for the following:    Glucose, Bld 281 (*)    Creatinine, Ser 1.67 (*)    ALT 13 (*)    GFR calc non Af Amer 36 (*)    GFR calc Af Amer 42 (*)    All other components within normal limits  CBC WITH DIFFERENTIAL/PLATELET - Abnormal; Notable for the following:    WBC 16.4 (*)    Hemoglobin 12.1 (*)    MCH 25.6 (*)    RDW 17.9 (*)    Neutro Abs 13.7 (*)    Monocytes Absolute 1.3 (*)    All other components within normal limits  I-STAT CG4 LACTIC ACID, ED - Abnormal; Notable for the following:    Lactic Acid, Venous 3.05 (*)    All other components within normal limits  CULTURE, BLOOD (ROUTINE X 2)  CULTURE, BLOOD (ROUTINE X 2)  URINE CULTURE  URINALYSIS, ROUTINE W REFLEX MICROSCOPIC (NOT AT [facility])  I-STAT CG4 LACTIC ACID, ED    Imaging Review Dg Chest 2 View  03/11/2015  CLINICAL DATA:  Fever, cough since last night EXAM: CHEST  2 VIEW COMPARISON:  02/26/2015 FINDINGS: There is left upper lobe airspace disease. There is no pleural effusion or pneumothorax. The heart and mediastinal contours are unremarkable. The osseous structures are unremarkable. IMPRESSION: Left upper lobe pneumonia. Followup PA and lateral chest X-ray is recommended in 3-4 weeks following trial of antibiotic therapy to ensure resolution and exclude underlying malignancy. Electronically Signed   By: Kathreen Devoid   On: 03/11/2015  11:03   I have personally reviewed  and evaluated these lab results as part of my medical decision-making.   EKG Interpretation None     Medications  sodium chloride 0.9 % bolus 1,000 mL (1,000 mLs Intravenous New Bag/Given 03/11/15 1010)    Followed by  sodium chloride 0.9 % bolus 500 mL (500 mLs Intravenous New Bag/Given 03/11/15 1020)  ceFEPIme (MAXIPIME) 1 g in dextrose 5 % 50 mL IVPB (not administered)  vancomycin (VANCOCIN) 1,500 mg in sodium chloride 0.9 % 500 mL IVPB (not administered)    MDM   Final diagnoses:  Left upper lobe pneumonia  Sepsis, due to unspecified organism [facility])    Patient presents from home with fever of 102 this morning in the setting of generalized weakness. He recently completed antibiotic course for sinus infection. On exam, he was awake, alert, comfortable and in no acute distress. Vital signs notable for BP 103/46. O2 sat 95% on room air. He was afebrile but had had Tylenol prior to arrival. No abdominal tenderness on exam. Because of hypotension and fever, initiated a code sepsis with blood and urine cultures, IV fluids, and vancomycin, cefepime.  Labs showed initial lactate of 3.05, mildly worse creatinine at 1.67, leukocytosis with WBC 16.4. Chest x-ray shows left upper lobe pneumonia. The patient's blood pressure has improved after IV fluids and antibiotics. I discussed admission with Elmo Putt, [facility], and pt admitted for further treatment of his infection.  Sharlett Iles, MD 03/11/15 1409

## 2015-03-11 NOTE — ED Notes (Signed)
Antibiotics being delivered by pharmacy.

## 2015-03-11 NOTE — ED Notes (Signed)
Report patient has strong odor to urine.

## 2015-03-11 NOTE — ED Notes (Signed)
Dr. Rex Kras made aware that fluid bolus and antibiotics are complete.

## 2015-03-11 NOTE — Progress Notes (Signed)
Pharmacy Antibiotic Note  Chad [name] is a 82 y.o. male admitted on 03/11/2015 with sepsis.  Pharmacy has been consulted for vancomycin and cefepime dosing.  Plan: Vancomycin 1500 mg x 1 then 1250 mg IV every 24 hours.  Goal trough 15-20 mcg/mL. Cefepime 2 gm q24h (an additional 1 gm will be given today)  Height: 5\' 9"  (175.3 cm) Weight: 180 lb (81.647 kg) IBW/kg (Calculated) : 70.7  Temp (24hrs), Avg:97.9 F (36.6 C), Min:97.9 F (36.6 C), Max:97.9 F (36.6 C)   Recent Labs Lab 03/11/15 1010 03/11/15 1025  WBC 16.4*  --   CREATININE 1.67*  --   LATICACIDVEN  --  3.05*    Estimated Creatinine Clearance: 33.5 mL/min (by C-G formula based on Cr of 1.67).    Allergies  Allergen Reactions  . Moxifloxacin Anaphylaxis    Avelox REACTION: anaphylaxis  . Penicillins     REACTION: hives.   . Sulfonamide Derivatives     REACTION: hives  . Timolol Maleate     Causes severe chest congestion    Antimicrobials this admission: Cefepime 2/15>> Vancomycin 2/15>>  Dose adjustments this admission: N/A  Microbiology results: 2/15 Blood x 2 2/15 Urine  Pharmacy Code Sepsis Protocol  Time of code sepsis page: 1004 [x]  Antibiotics delivered at 1023  Were antibiotics ordered at the time of the code sepsis page? Yes Was it required to contact the physician? []  Physician not contacted []  Physician contacted to order antibiotics for code sepsis []  Physician contacted to recommend changing antibiotics  Pharmacy consulted for: vancomycin and cefepime  Thank you for allowing pharmacy to be a part of this patient's care.  Levester Fresh, PharmD, BCPS, [facility] Clinical Pharmacist Pager [phone] 03/11/2015 11:22 AM

## 2015-03-11 NOTE — ED Notes (Signed)
Arrived via EMS from home placed on antibiotics for 10 days for URI.  Today fever 102.0 F tylenol given by wife 0530 and general weakness.

## 2015-03-11 NOTE — H&P (Signed)
[facility] History and Physical                                                                                    Chad [name], is a 82 y.o. male  MRN: [id number]   DOB - 04-17-1929  Admit Date - 03/11/2015  Outpatient Primary MD  Pricilla Holm, MD  Pulmonologist Dr. Melvyn Novas  With History of -  Past Medical History  Diagnosis Date  . Fatty liver disease, nonalcoholic   . Coronary artery stenosis   . Diverticulosis of colon   . Diaphragmatic hernia without mention of obstruction or gangrene   . Dyspnea   . Personal history of other malignant neoplasm of skin   . Barrett's esophagus   . TIA (transient ischemic attack) 1998 X "a few"  . Hyperlipidemia   . Acid reflux disease   . Glaucoma   . HTN (hypertension)   . RENAL CALCULUS, HX OF 08/28/2008  . CAROTID ARTERY STENOSIS, BILATERAL 08/27/2008  . Esophageal stenosis   . COPD (chronic obstructive pulmonary disease) ([place])     "severe" (12/20/2013)  . Chronic bronchitis ([place])     "most q yr; he's had it several times" (12/20/2013)  . Pneumonia 1990's X 4  . Type II diabetes mellitus ([place])   . History of blood transfusion 2007    "related to OR"  . History of hiatal hernia     "repaired when esophagus removed"  . History of stomach ulcers   . Sinus headache     "seasonal"  . Arthritis     "all over"  . Kidney stones     "passed them all" (12/20/2013)  . Basal cell carcinoma of nose       Past Surgical History  Procedure Laterality Date  . Esophagectomy  2007    with stomach pull through  . Pars plana vitrectomy Bilateral 01/2007-05/2007  . Ventral hernia repair  02/2006  . Knee arthroscopy Bilateral 04/1989; 08/1996; 06/1999    "right; left; left"  . Carotid endarterectomy Bilateral 02/1996-03/1996  . Shoulder arthroscopy Right 04/1998  . Mohs surgery  ?2002    "tip of nose; basal cell"  . Nasal polyp excision  2005    "benign"  . Hernia repair    . Cataract extraction w/ intraocular lens implant Bilateral  04/2007 - 09/2007    "left-right"  . Glaucoma surgery Right 11/2008    "implanted drain tube"  . Esophagogastroduodenoscopy (egd) with esophageal dilation  10/2011  . Eye surgery    . Eye surgery Right 04/2006; 08/2007    "had gas bubbles put in"  . Eye surgery Right 01/2007    "air bubble"  . Eye surgery Left 12/12/2013    "cleaned his implant w/laser"    in for   Chief Complaint  Patient presents with  . Fever     HPI  Chad [name]  is a 82 y.o. male recently diagnosed with moderate demenita, hx of COPD, DM2, hypertension presents to ED with fever 102.2 this am and weakness. Most information obtained from wife as pt has dementia and is very poor historian. According to wife, pt was seen by Dr. Melvyn Novas on  2/2 and rx'd Augmentin and prednisone taper for "sinusitis". Chest xray was unremarkable at that time. Wife states pt was doing 'okay' after completing meds last week, but began coughing again last night and with fever of 102 this am. She states pt needed assistance getting out of bed this am due to generalized weakness. She gave    Upon evaluation in ED, chest xray shows LUL pna, BP 87/52, WBC 16.4, lactic acid 3.05. He denies recent headache, dizziness, chest pain, sob, abdominal pain, n/v/d. Cough productive of thick sputum. Pt to be admitted to SDU for sepsis.   Review of Systems   In addition to the HPI above,  No Headache, No changes with Vision or hearing, No problems swallowing food or Liquids, No Chest pain, Cough or Shortness of Breath, No Abdominal pain, No Nausea or Vomiting, Bowel movements are regular, No Blood in stool or Urine, No dysuria, No new skin rashes or bruises, No new joints pains-aches,  No new weakness, tingling, numbness in any extremity, No recent weight gain or loss, A full 10 point Review of Systems was done, except as stated above, all other Review of Systems were negative.  Social History Married. Long hx tobacco abuse, now nonsmoker. No  etoh.  Family History Family History  Problem Relation Age of Onset  . Colon cancer Mother   . Lung cancer Mother   . Heart attack Mother   . Colon cancer Maternal Grandfather   . Heart attack Brother   . Heart attack Brother   . Stomach cancer Father   . Esophageal cancer Father     Prior to Admission medications   Medication Sig Start Date End Date Taking? Authorizing Provider  albuterol (PROAIR HFA) 108 (90 BASE) MCG/ACT inhaler Inhale 1-2 puffs into the lungs every 6 (six) hours as needed. Reported on 03/09/2015   Yes Historical Provider, MD  albuterol (PROVENTIL) (2.5 MG/3ML) 0.083% nebulizer solution 1 vial in nebulizer every 6 hours and as needed for wheezing/shortness of breath 01/30/15  Yes Tanda Rockers, MD  amLODipine (NORVASC) 5 MG tablet Take 1 tablet (5 mg total) by mouth daily. 04/24/14  Yes Hoyt Koch, MD  aspirin 81 MG tablet Take 81 mg by mouth daily.     Yes Historical Provider, MD  brimonidine (ALPHAGAN P) 0.1 % SOLN Place 1 drop into the left eye 2 (two) times daily.     Yes Historical Provider, MD  budesonide-formoterol (SYMBICORT) 160-4.5 MCG/ACT inhaler Take 2 puffs first thing in am and then another 2 puffs about 12 hours later. 07/02/14  Yes Tanda Rockers, MD  Cholecalciferol (VITAMIN D) 1000 UNITS capsule Take 1,000 Units by mouth daily.     Yes Historical Provider, MD  cloNIDine (CATAPRES) 0.2 MG tablet 1 tab by mouth as needed when BP is over 140    Yes Historical Provider, MD  dextromethorphan-guaiFENesin (MUCINEX DM) 30-600 MG 12hr tablet Take 1 tablet by mouth 2 (two) times daily. Reported on 03/09/2015   Yes Historical Provider, MD  diazepam (VALIUM) 5 MG tablet Take 2.5 mg by mouth daily as needed (dizziness, inner ear). Reported on 03/09/2015   Yes Historical Provider, MD  donepezil (ARICEPT) 10 MG tablet Take 1 tablet (10 mg total) by mouth at bedtime. Patient taking differently: Take 10 mg by mouth every morning.  03/09/15  Yes Cameron Sprang, MD   furosemide (LASIX) 40 MG tablet Take 40 mg by mouth.   Yes Historical Provider, MD  HYDROcodone-homatropine (HYCODAN) 5-1.5 MG/5ML  syrup Take 5 mLs by mouth every 6 (six) hours as needed for cough. 01/22/15  Yes Hoyt Koch, MD  losartan (COZAAR) 100 MG tablet Take 1 tablet (100 mg total) by mouth daily. 04/16/14  Yes Hoyt Koch, MD  metFORMIN (GLUCOPHAGE) 500 MG tablet Take 1 tablet (500 mg total) by mouth daily with breakfast. 12/24/14  Yes Hoyt Koch, MD  mirtazapine (REMERON) 15 MG tablet Take 1 tablet (15 mg total) by mouth at bedtime. Patient taking differently: Take 7.5 mg by mouth at bedtime.  02/19/15  Yes Hoyt Koch, MD  omeprazole (PRILOSEC) 20 MG capsule Take 40 mg by mouth 2 (two) times daily.    Yes Historical Provider, MD  Potassium Gluconate 550 (90 K) MG TABS Take by mouth daily. Take 2 tablets daily   Yes Historical Provider, MD  promethazine (PHENERGAN) 25 MG tablet Reported on 03/09/2015   Yes Historical Provider, MD  valACYclovir (VALTREX) 500 MG tablet Take 2 tablets (1,000 mg total) by mouth every 8 (eight) hours as needed. For out breaks 03/04/15  Yes Hoyt Koch, MD  vitamin B-12 (CYANOCOBALAMIN) 1000 MCG tablet Take 1 tablet (1,000 mcg total) by mouth daily. Patient taking differently: Take 2000 mg daily 02/26/14  Yes Hoyt Koch, MD    Allergies  Allergen Reactions  . Moxifloxacin Anaphylaxis    Avelox REACTION: anaphylaxis  . Penicillins     REACTION: hives.   . Sulfonamide Derivatives     REACTION: hives  . Timolol Maleate     Causes severe chest congestion    Physical Exam  Vitals  Blood pressure 129/66, pulse 63, temperature 98.4 F (36.9 C), temperature source Oral, resp. rate 14, height 5\' 9"  (1.753 m), weight 81.647 kg (180 lb), SpO2 100 %.   General:  Elderly gentleman sitting upright in bed in NAD.  Psych:  Normal affect and insight, Not Suicidal or Homicidal, Awake Alert, Oriented X 2 per  baseline  Neuro:   No F.N deficits, ALL C.Nerves Intact, Strength 5/5 all 4 extremities, Sensation intact all 4 extremities.  ENT:  Ears and Eyes appear Normal, Conjunctivae clear, PER. Moist oral mucosa without erythema or exudates.  Neck:  Supple, No lymphadenopathy appreciated  Respiratory:  Symmetrical chest wall movement, diminished lung sounds LUL and bilateral LL. No wheezes, rales or crackes.   Cardiac:  RRR, No Murmurs, no LE edema noted, no JVD.    Abdomen:  Positive bowel sounds, Soft, Non tender, Non distended. Large hernia, easily reduced.  Skin:  No Cyanosis, Normal Skin Turgor, No Skin Rash or Bruise.  Extremities:  Able to move all 4. 5/5 strength in each,  no effusions.  Data Review  CBC  Recent Labs Lab 03/11/15 1010  WBC 16.4*  HGB 12.1*  HCT 39.7  PLT 200  MCV 84.1  MCH 25.6*  MCHC 30.5  RDW 17.9*  LYMPHSABS 1.2  MONOABS 1.3*  EOSABS 0.1  BASOSABS 0.0    Chemistries   Recent Labs Lab 03/11/15 1010  NA 139  K 4.7  CL 103  CO2 23  GLUCOSE 281*  BUN 20  CREATININE 1.67*  CALCIUM 10.2  AST 15  ALT 13*  ALKPHOS 63  BILITOT 0.7    estimated creatinine clearance is 33.5 mL/min (by C-G formula based on Cr of 1.67).    Coagulation profile No results for input(s): INR, PROTIME in the last 168 hours.  Cardiac Enzymes No results for input(s): CKMB, TROPONINI, MYOGLOBIN in the last 168  hours.  Invalid input(s): CK  Invalid input(s): POCBNP   Lactate 3.05  Urinalysis    Component Value Date/Time   COLORURINE YELLOW 07/25/2014 1440   APPEARANCEUR CLEAR 07/25/2014 1440   LABSPEC 1.019 07/25/2014 1440   PHURINE 6.0 07/25/2014 1440   GLUCOSEU NEGATIVE 07/25/2014 1440   HGBUR NEGATIVE 07/25/2014 1440   BILIRUBINUR SMALL* 07/25/2014 1440   KETONESUR NEGATIVE 07/25/2014 1440   PROTEINUR NEGATIVE 07/25/2014 1440   UROBILINOGEN 1.0 07/25/2014 1440   NITRITE NEGATIVE 07/25/2014 1440   LEUKOCYTESUR NEGATIVE 07/25/2014 1440     Imaging results:   Dg Chest 2 View  03/11/2015  CLINICAL DATA:  Fever, cough since last night EXAM: CHEST  2 VIEW COMPARISON:  02/26/2015 FINDINGS: There is left upper lobe airspace disease. There is no pleural effusion or pneumothorax. The heart and mediastinal contours are unremarkable. The osseous structures are unremarkable. IMPRESSION: Left upper lobe pneumonia. Followup PA and lateral chest X-ray is recommended in 3-4 weeks following trial of antibiotic therapy to ensure resolution and exclude underlying malignancy. Electronically Signed   By: Kathreen Devoid   On: 03/11/2015 11:03   Dg Chest 2 View  02/26/2015  CLINICAL DATA:  Congestion, sinus congestion,x intermittent 1 mo, fever yesterday, HTN, diabetic, x-smoker, COPD. EXAM: CHEST  2 VIEW COMPARISON:  01/30/2015 FINDINGS: Opacity superimposed on the cardiac silhouette on the frontal view and posterior to the cardiac silhouette on the lateral view is stable consistent with postoperative change from the previous esophagectomy. No evidence of pneumonia or pulmonary edema. No pleural effusion or pneumothorax. The cardiac silhouette is normal in size. No mediastinal or hilar masses or convincing adenopathy. The bony thorax is intact. IMPRESSION: No acute cardiopulmonary disease. Stable appearance from the prior study. Electronically Signed   By: Lajean Manes M.D.   On: 02/26/2015 15:01   [place] Wo Cm  03/02/2015  CLINICAL DATA:  82 year old male with cough and fever with runny nose. History of sinusitis in the past. EXAM: CT PARANASAL SINUS LIMITED WITHOUT CONTRAST TECHNIQUE: Non-contiguous multidetector CT images of the paranasal sinuses were obtained in a single plane without contrast. COMPARISON:  03/16/2014 head CT FINDINGS: Polypoid opacification right frontal sinus. Left frontal sinus clear. Opacification anterior right ethmoid sinus air cells with remainder of ethmoid sinuses clear. Minimal mucosal thickening inferior aspect  maxillary sinuses. Sphenoid sinus air cells are clear.  Border formed by carotid canal. Visualized mastoid air cells and middle ear cavities are clear. Polypoid lesion within the left nasal vault. Post right orbital surgery. Intracranial atrophy and chronic small vessel disease type changes. IMPRESSION: Polypoid opacification right frontal sinus. Left frontal sinus clear. Opacification anterior right ethmoid sinus air cells with remainder of ethmoid sinuses clear. Minimal mucosal thickening inferior aspect maxillary sinuses. Sphenoid sinus air cells are clear. 1.5 cm polypoid lesion within the left nasal vault. Electronically Signed   By: Genia Del M.D.   On: 03/02/2015 13:57    My personal review of EKG: NSR, No ST changes noted.   Assessment & Plan  Active Problems:   HLD (hyperlipidemia)   Essential hypertension   COPD GOLD II    Diabetes mellitus without complication (HCC)   Moderate dementia with behavioral disturbance   Pneumonia   Sepsis ([place])   Acute kidney injury ([place])  Sepsis (as evidenced by hypotension, inc WBC, fever) secondary to CAP -Will admit to SDU.  -blood culture x 2, sputum culture -PCN allergy listed, but wife uncertain. Will order Vanc and Aztreonam for now -monitor  lactic acid WBC trend  Acute kidney injury -secondary to dehydration in setting of above. Baseline appears to be around 1.0 -will hold Lasix, ARB, Metformin -gentle IVFs. He did receive 1500cc bolus in ED  Hx hypertension with hypotension on admit -improved with IVFs -will hold antihypertensives for now. Monitor closely with need to resume -prn hydralazine  DM2 -A1c 7.9% 11/2014 -holding oral agents while inpt -SSI while inpt  Hx COPD Currently stable  Dementia -recently diagnosed. -continue home meds.   DVT Prophylaxis SCDs  AM Labs Ordered, also please review Full Orders  Family Communication:   Wife at bedside on admission exam  Code Status:   Limited code. No intubation  per wife and pt  Condition:  guarded  Time spent in minutes : [place], NP on 03/11/2015 at 12:11 PM Between 7am to 7pm - Pager - [phone] After 7pm go to [email or web address] - password [email or web address]  And look for the night coverage person covering me after hours  [facility]

## 2015-03-11 NOTE — ED Notes (Signed)
PT taken to [facility] via stretcher at this time. Belongings with PT.

## 2015-03-11 NOTE — Telephone Encounter (Signed)
Will route message to MW to make him aware. 

## 2015-03-11 NOTE — ED Notes (Signed)
Colletta Maryland, RN accepts report at this time.

## 2015-03-11 NOTE — ED Notes (Signed)
PT sitting up in bed eating. PT denies assistance with feeding. PT has call bell in reach.

## 2015-03-12 DIAGNOSIS — J449 Chronic obstructive pulmonary disease, unspecified: Secondary | ICD-10-CM

## 2015-03-12 DIAGNOSIS — F0391 Unspecified dementia with behavioral disturbance: Secondary | ICD-10-CM

## 2015-03-12 LAB — HIV ANTIBODY (ROUTINE TESTING W REFLEX): HIV SCREEN 4TH GENERATION: NONREACTIVE

## 2015-03-12 LAB — LEGIONELLA ANTIGEN, URINE

## 2015-03-12 LAB — GLUCOSE, CAPILLARY
GLUCOSE-CAPILLARY: 226 mg/dL — AB (ref 65–99)
Glucose-Capillary: 184 mg/dL — ABNORMAL HIGH (ref 65–99)
Glucose-Capillary: 215 mg/dL — ABNORMAL HIGH (ref 65–99)
Glucose-Capillary: 155 mg/dL — ABNORMAL HIGH (ref 65–99)

## 2015-03-12 LAB — LACTIC ACID, PLASMA: Lactic Acid, Venous: 1.5 mmol/L (ref 0.5–2.0)

## 2015-03-12 LAB — BASIC METABOLIC PANEL
Anion gap: 10 (ref 5–15)
BUN: 16 mg/dL (ref 6–20)
CHLORIDE: 110 mmol/L (ref 101–111)
CO2: 21 mmol/L — ABNORMAL LOW (ref 22–32)
CREATININE: 1.19 mg/dL (ref 0.61–1.24)
Calcium: 9 mg/dL (ref 8.9–10.3)
GFR calc Af Amer: >60 mL/min (ref >60)
GFR, EST NON AFRICAN AMERICAN: 55 mL/min — AB (ref >60)
GLUCOSE: 226 mg/dL — AB (ref 65–99)
POTASSIUM: 3.9 mmol/L (ref 3.5–5.1)
Sodium: 141 mmol/L (ref 135–145)

## 2015-03-12 LAB — CBC
HCT: 33.6 % — ABNORMAL LOW (ref 39.0–52.0)
Hemoglobin: 10.7 g/dL — ABNORMAL LOW (ref 13.0–17.0)
MCH: 27 pg (ref 26.0–34.0)
MCHC: 31.8 g/dL (ref 30.0–36.0)
MCV: 84.8 fL (ref 78.0–100.0)
PLATELETS: 163 10*3/uL (ref 150–400)
RBC: 3.96 MIL/uL — AB (ref 4.22–5.81)
RDW: 18.2 % — ABNORMAL HIGH (ref 11.5–15.5)
WBC: 11.2 10*3/uL — ABNORMAL HIGH (ref 4.0–10.5)

## 2015-03-12 LAB — URINE CULTURE: Culture: NO GROWTH

## 2015-03-12 MED ORDER — LOSARTAN POTASSIUM 50 MG PO TABS
100.0000 mg | ORAL_TABLET | Freq: Every day | ORAL | Status: DC
Start: 2015-03-12 — End: 2015-03-12

## 2015-03-12 MED ORDER — HYDRALAZINE HCL 20 MG/ML IJ SOLN
10.0000 mg | Freq: Four times a day (QID) | INTRAMUSCULAR | Status: DC | PRN
  Administered 2015-03-12: 10 mg via INTRAVENOUS
  Filled 2015-03-12: qty 1

## 2015-03-12 MED ORDER — INSULIN ASPART 100 UNIT/ML ~~LOC~~ SOLN
0.0000 [IU] | SUBCUTANEOUS | Status: DC
  Administered 2015-03-12 (×2): 5 [IU] via SUBCUTANEOUS
  Administered 2015-03-12 – 2015-03-13 (×3): 3 [IU] via SUBCUTANEOUS

## 2015-03-12 MED ORDER — DEXTROSE 5 % IV SOLN
2.0000 g | Freq: Two times a day (BID) | INTRAVENOUS | Status: DC
  Administered 2015-03-12 – 2015-03-13 (×3): 2 g via INTRAVENOUS
  Filled 2015-03-12 (×4): qty 2

## 2015-03-12 NOTE — Progress Notes (Signed)
Utilization Review Completed.  

## 2015-03-12 NOTE — Progress Notes (Signed)
[facility] TEAM 1 - Stepdown/ICU TEAM Progress Note  Chad [name] [id number] DOB: November 27, 1929 DOA: 03/11/2015 PCP: Hoyt Koch, MD  Admit HPI / Brief Narrative: Chad [name] 82 y.o. WM PMHx Moderate Demenita, TIA, COPD, DM Type 2, HTN, HLD, bilateral Carotid Artery Stenosis, Fatty Liver Dz Nonalcoholic, Gastric Ulcers, Barrett's esophagitis Diaphragmatic Hernia, Nephrolithiasis, Basal Cell Carcinoma of nose  Presents to ED with fever 102.2 this am and weakness. Most information obtained from wife as pt has dementia and is very poor historian. According to wife, pt was seen by Dr. Melvyn Novas on 2/2 and rx'd Augmentin and prednisone taper for "sinusitis". Chest xray was unremarkable at that time. Wife states pt was doing 'okay' after completing meds last week, but began coughing again last night and with fever of 102 this am. She states pt needed assistance getting out of bed this am due to generalized weakness. She gave   Upon evaluation in ED, chest xray shows LUL pna, BP 87/52, WBC 16.4, lactic acid 3.05. He denies recent headache, dizziness, chest pain, sob, abdominal pain, n/v/d. Cough productive of thick sputum. Pt to be admitted to SDU for sepsis.   HPI/Subjective: 2/16 A/O 4, some mild confusion obeys all commands. States not on home O2  Assessment/Plan: Sepsis (as evidenced by hypotension, inc WBC, fever) secondary to HCAP -blood culture x 2, sputum culture -Continue current antibiotics -Lactic acid normalized, leukocytosis trending down  COPD -Currently no requirement for bronchodilators or steroids. -Continue home Symbicort 160-4.5 BID -PRN DuoNeb  Acute kidney injury -secondary to dehydration in setting of above. Improving with hydration -Continue to hold  Lasix, ARB, Metformin -Continue normal saline 125 ml/hr   Hx hypertension with hypotension on admit -improved with IVFs -will hold antihypertensives for now. Monitor closely with need to resume -prn  hydralazine; SBP> 160  DM Type 2 uncontrolled -11/2014 hemoglobin A1c =7.9%  -holding oral agents while inpt -Moderate SSI  Dementia -Donepezil 10 mg QHS     Code Status:Limited code. No intubation per wife and pt Family Communication: no family present at time of exam Disposition Plan: SNF?    Consultants: NA  Procedure/Significant Events: NA    Culture 2/15 blood left hand/right AC NGTD 2/15 urine pending 2/15 MRSA by PCR negative 2/15 strep pneumo urine antigen negative 2/15 A/B/H1N1 negative  2/16 respiratory virus panel pending   Antibiotics: Cefepime 2/15>> Vancomycin 2/15>>  DVT prophylaxis: SCD   Devices    LINES / TUBES:      Continuous Infusions: . sodium chloride 125 mL/hr at 03/12/15 [id number]    Objective: VITAL SIGNS: Temp: 98.9 F (37.2 C) (02/16 0827) Temp Source: Oral (02/16 0827) BP: 147/69 mmHg (02/16 0827) Pulse Rate: 86 (02/16 0827) SPO2; FIO2:   Intake/Output Summary (Last 24 hours) at 03/12/15 1233 Last data filed at 03/12/15 1037  Gross per 24 hour  Intake   1625 ml  Output   3120 ml  Net  -1495 ml     Exam: General:A/O 4, some mild confusion obeys all commands, No acute respiratory distress Eyes: Negative headache, negative scleral hemorrhage ENT: Negative Runny nose, negative gingival bleeding, Neck:  Negative scars, masses, torticollis, lymphadenopathy, JVD Lungs: Clear to auscultation bilaterally without wheezes or crackles Cardiovascular: Regular rate and rhythm without murmur gallop or rub normal S1 and S2 Abdomen:negative abdominal pain, nondistended, positive soft, bowel sounds, no rebound, no ascites, large hernia reducible (per patient since there since seen at [facility])  Extremities: No significant cyanosis, clubbing, or edema bilateral lower extremities  Psychiatric:  Negative depression, negative anxiety, negative fatigue, negative mania  Neurologic:  Cranial nerves II through XII intact, tongue/uvula  midline, all extremities muscle strength 5/5, sensation intact throughout, negative dysarthria, negative expressive aphasia, negative receptive aphasia.   Data Reviewed: Basic Metabolic Panel:  Recent Labs Lab 03/11/15 1010 03/12/15 0100  NA 139 141  K 4.7 3.9  CL 103 110  CO2 23 21*  GLUCOSE 281* 226*  BUN 20 16  CREATININE 1.67* 1.19  CALCIUM 10.2 9.0   Liver Function Tests:  Recent Labs Lab 03/11/15 1010  AST 15  ALT 13*  ALKPHOS 63  BILITOT 0.7  PROT 6.7  ALBUMIN 3.5   No results for input(s): LIPASE, AMYLASE in the last 168 hours. No results for input(s): AMMONIA in the last 168 hours. CBC:  Recent Labs Lab 03/11/15 1010 03/12/15 0100  WBC 16.4* 11.2*  NEUTROABS 13.7*  --   HGB 12.1* 10.7*  HCT 39.7 33.6*  MCV 84.1 84.8  PLT 200 163   Cardiac Enzymes: No results for input(s): CKTOTAL, CKMB, CKMBINDEX, TROPONINI in the last 168 hours. BNP (last 3 results) No results for input(s): BNP in the last 8760 hours.  ProBNP (last 3 results) No results for input(s): PROBNP in the last 8760 hours.  CBG:  Recent Labs Lab 03/11/15 2217 03/12/15 0826  GLUCAP 166* 184*    Recent Results (from the past 240 hour(s))  Culture, blood (routine x 2)     Status: None (Preliminary result)   Collection Time: 03/11/15 10:10 AM  Result Value Ref Range Status   Specimen Description BLOOD LEFT HAND  Final   Special Requests IN PEDIATRIC BOTTLE 1CC  Final   Culture NO GROWTH < 24 HOURS  Final   Report Status PENDING  Incomplete  Culture, blood (routine x 2)     Status: None (Preliminary result)   Collection Time: 03/11/15 10:15 AM  Result Value Ref Range Status   Specimen Description BLOOD RIGHT ANTECUBITAL  Final   Special Requests BOTTLES DRAWN AEROBIC AND ANAEROBIC 5CC  Final   Culture NO GROWTH < 24 HOURS  Final   Report Status PENDING  Incomplete  Urine culture     Status: None   Collection Time: 03/11/15  1:17 PM  Result Value Ref Range Status   Specimen  Description URINE, RANDOM  Final   Special Requests NONE  Final   Culture NO GROWTH 1 DAY  Final   Report Status 03/12/2015 FINAL  Final  MRSA PCR Screening     Status: None   Collection Time: 03/11/15  5:24 PM  Result Value Ref Range Status   MRSA by PCR NEGATIVE NEGATIVE Final    Comment:        The GeneXpert MRSA Assay (FDA approved for NASAL specimens only), is one component of a comprehensive MRSA colonization surveillance program. It is not intended to diagnose MRSA infection nor to guide or monitor treatment for MRSA infections.      Studies:  Recent x-ray studies have been reviewed in detail by the Attending Physician  Scheduled Meds:  Scheduled Meds: . aspirin  81 mg Oral Daily  . brimonidine  1 drop Left Eye BID  . budesonide-formoterol  2 puff Inhalation BID  . ceFEPime (MAXIPIME) IV  2 g Intravenous Q12H  . dextromethorphan-guaiFENesin  1 tablet Oral BID  . donepezil  10 mg Oral q morning - 10a  . insulin aspart  0-15 Units Subcutaneous 6 times per day  . mirtazapine  7.5 mg Oral QHS  . pantoprazole  40 mg Oral Daily  . vancomycin  1,250 mg Intravenous Q24H    Time spent on care of this patient: 40 mins   Raine Blodgett, Geraldo Docker , MD  [facility] Office  [phone] Pager [phone]  On-Call/Text Page:      [email or web address]      password [email or web address]  If 7PM-7AM, please contact night-coverage [email or web address] Password [facility] 03/12/2015, 12:33 PM   LOS: 1 day   Care during the described time interval was provided by me .  I have reviewed this patient's available data, including medical history, events of note, physical examination, and all test results as part of my evaluation. I have personally reviewed and interpreted all radiology studies.   Dia Crawford, MD [phone] Pager

## 2015-03-13 ENCOUNTER — Ambulatory Visit: Payer: Medicare HMO | Admitting: Internal Medicine

## 2015-03-13 LAB — GLUCOSE, CAPILLARY
GLUCOSE-CAPILLARY: 176 mg/dL — AB (ref 65–99)
GLUCOSE-CAPILLARY: 211 mg/dL — AB (ref 65–99)
GLUCOSE-CAPILLARY: 240 mg/dL — AB (ref 65–99)
Glucose-Capillary: 179 mg/dL — ABNORMAL HIGH (ref 65–99)
Glucose-Capillary: 189 mg/dL — ABNORMAL HIGH (ref 65–99)
Glucose-Capillary: 231 mg/dL — ABNORMAL HIGH (ref 65–99)

## 2015-03-13 MED ORDER — METFORMIN HCL 500 MG PO TABS
500.0000 mg | ORAL_TABLET | Freq: Every day | ORAL | Status: DC
  Administered 2015-03-14: 500 mg via ORAL
  Filled 2015-03-13: qty 1

## 2015-03-13 MED ORDER — INSULIN ASPART 100 UNIT/ML ~~LOC~~ SOLN
0.0000 [IU] | Freq: Three times a day (TID) | SUBCUTANEOUS | Status: DC
  Administered 2015-03-13: 3 [IU] via SUBCUTANEOUS
  Administered 2015-03-14: 1 [IU] via SUBCUTANEOUS
  Administered 2015-03-14 – 2015-03-15 (×4): 2 [IU] via SUBCUTANEOUS

## 2015-03-13 MED ORDER — AZITHROMYCIN 500 MG PO TABS
500.0000 mg | ORAL_TABLET | Freq: Every day | ORAL | Status: DC
  Administered 2015-03-13 – 2015-03-15 (×3): 500 mg via ORAL
  Filled 2015-03-13 (×3): qty 1

## 2015-03-13 MED ORDER — INSULIN ASPART 100 UNIT/ML ~~LOC~~ SOLN
0.0000 [IU] | Freq: Every day | SUBCUTANEOUS | Status: DC
  Administered 2015-03-13: 2 [IU] via SUBCUTANEOUS

## 2015-03-13 MED ORDER — DEXTROSE 5 % IV SOLN
1.0000 g | INTRAVENOUS | Status: DC
  Administered 2015-03-13 – 2015-03-14 (×2): 1 g via INTRAVENOUS
  Filled 2015-03-13 (×6): qty 10

## 2015-03-13 MED ORDER — ACETAMINOPHEN 325 MG PO TABS
650.0000 mg | ORAL_TABLET | Freq: Four times a day (QID) | ORAL | Status: DC | PRN
  Administered 2015-03-13: 650 mg via ORAL
  Filled 2015-03-13: qty 2

## 2015-03-13 MED ORDER — ENOXAPARIN SODIUM 40 MG/0.4ML ~~LOC~~ SOLN
40.0000 mg | SUBCUTANEOUS | Status: DC
  Administered 2015-03-13 – 2015-03-14 (×2): 40 mg via SUBCUTANEOUS
  Filled 2015-03-13 (×2): qty 0.4

## 2015-03-13 MED ORDER — SODIUM CHLORIDE 0.9 % IV SOLN: INTRAVENOUS | Status: DC

## 2015-03-13 NOTE — Progress Notes (Signed)
[place] TEAM 1 - Stepdown/ICU TEAM PROGRESS NOTE  Chad [name] [id number] DOB: 09-17-1929 DOA: 03/11/2015 PCP: Hoyt Koch, MD  Admit HPI / Brief Narrative: 82yo M Hx Moderate Demenita, TIA, COPD, DM2, HTN, HLD, bilateral Carotid Artery Stenosis, Fatty Liver Dz Nonalcoholic, Gastric Ulcers, Barrett's esophagitis, Diaphragmatic Hernia, Nephrolithiasis, and Basal Cell Carcinoma of nose who presented to the ED with fever 102.2 and weakness. Pt was seen by Dr. Melvyn Novas on 2/2 and rx'd Augmentin and prednisone taper for "sinusitis". Chest xray was unremarkable at that time.   In the ED chest xray showed LUL pna, BP 87/52, WBC 16.4, lactic acid 3.05.   HPI/Subjective: The patient is awake and eating lunch without difficulty.  His wife states that his mental status is much closer to baseline.  The patient denies shortness of breath chest pain nausea vomiting or abdominal pain.  Assessment/Plan:  Sepsis (elevated WBC, fever) secondary to LUL Pneumonia  Rapidly improving clinically - continue empiric antibiotics and volume resuscitation - begin ambulation and therapy evaluations  COPD -Continue home Symbicort BID - well compensated at present  Acute kidney injury -secondary to dehydration in setting of above - improving with hydration  Hx hypertension with hypotension on admit -improved with IVFs  DM Type 2 uncontrolled -11/2014 A1c 7.9 - CBG climbing - adjust medical therapy and follow  Dementia -Donepezil 10 mg QHS   Code Status: DO NOT INTUBATE Family Communication: Spoke with wife at bedside Disposition Plan: Transfer to medical bed - PT/OT evaluations - anticipate discharge in 48-72 hours  Consultants: none  Procedures: non  Antibiotics: Cefepime 2/15 > Vancomycin 2/15 > 2/17  DVT prophylaxis: SCDs  Objective: Blood pressure 126/64, pulse 65, temperature 98 F (36.7 C), temperature source Oral, resp. rate 21, height 5\' 9"  (1.753 m), weight 97.3 kg  (214 lb 8.1 oz), SpO2 95 %.  Intake/Output Summary (Last 24 hours) at 03/13/15 1322 Last data filed at 03/13/15 0204  Gross per 24 hour  Intake 970.83 ml  Output   3350 ml  Net -2379.17 ml   Exam: General: No acute respiratory distress Lungs: Clear to auscultation bilaterally without wheezes or crackles Cardiovascular: Regular rate and rhythm without murmur gallop or rub normal S1 and S2 Abdomen: Nontender, nondistended, soft, bowel sounds positive, no rebound, no ascites, no appreciable mass Extremities: No significant cyanosis, clubbing, or edema bilateral lower extremities  Data Reviewed:  Basic Metabolic Panel:  Recent Labs Lab 03/11/15 1010 03/12/15 0100  NA 139 141  K 4.7 3.9  CL 103 110  CO2 23 21*  GLUCOSE 281* 226*  BUN 20 16  CREATININE 1.67* 1.19  CALCIUM 10.2 9.0    CBC:  Recent Labs Lab 03/11/15 1010 03/12/15 0100  WBC 16.4* 11.2*  NEUTROABS 13.7*  --   HGB 12.1* 10.7*  HCT 39.7 33.6*  MCV 84.1 84.8  PLT 200 163    Liver Function Tests:  Recent Labs Lab 03/11/15 1010  AST 15  ALT 13*  ALKPHOS 63  BILITOT 0.7  PROT 6.7  ALBUMIN 3.5   Coags:  Recent Labs Lab 03/11/15 1815  INR 1.08    Recent Labs Lab 03/11/15 1815  APTT 33   CBG:  Recent Labs Lab 03/12/15 2053 03/13/15 0031 03/13/15 0501 03/13/15 0807 03/13/15 1236  GLUCAP 226* 179* 189* 176* 211*    Recent Results (from the past 240 hour(s))  Culture, blood (routine x 2)     Status: None (Preliminary result)   Collection Time: 03/11/15 10:10 AM  Result Value Ref Range Status   Specimen Description BLOOD LEFT HAND  Final   Special Requests IN PEDIATRIC BOTTLE 1CC  Final   Culture NO GROWTH 1 DAY  Final   Report Status PENDING  Incomplete  Culture, blood (routine x 2)     Status: None (Preliminary result)   Collection Time: 03/11/15 10:15 AM  Result Value Ref Range Status   Specimen Description BLOOD RIGHT ANTECUBITAL  Final   Special Requests BOTTLES DRAWN  AEROBIC AND ANAEROBIC 5CC  Final   Culture NO GROWTH 1 DAY  Final   Report Status PENDING  Incomplete  Urine culture     Status: None   Collection Time: 03/11/15  1:17 PM  Result Value Ref Range Status   Specimen Description URINE, RANDOM  Final   Special Requests NONE  Final   Culture NO GROWTH 1 DAY  Final   Report Status 03/12/2015 FINAL  Final  MRSA PCR Screening     Status: None   Collection Time: 03/11/15  5:24 PM  Result Value Ref Range Status   MRSA by PCR NEGATIVE NEGATIVE Final    Comment:        The GeneXpert MRSA Assay (FDA approved for NASAL specimens only), is one component of a comprehensive MRSA colonization surveillance program. It is not intended to diagnose MRSA infection nor to guide or monitor treatment for MRSA infections.      Studies:   Recent x-ray studies have been reviewed in detail by the Attending Physician  Scheduled Meds:  Scheduled Meds: . aspirin  81 mg Oral Daily  . brimonidine  1 drop Left Eye BID  . budesonide-formoterol  2 puff Inhalation BID  . ceFEPime (MAXIPIME) IV  2 g Intravenous Q12H  . dextromethorphan-guaiFENesin  1 tablet Oral BID  . donepezil  10 mg Oral q morning - 10a  . insulin aspart  0-15 Units Subcutaneous 6 times per day  . mirtazapine  7.5 mg Oral QHS  . pantoprazole  40 mg Oral Daily  . vancomycin  1,250 mg Intravenous Q24H    Time spent on care of this patient: 35 mins   MCCLUNG,JEFFREY T , MD   [facility] Office  [phone] Pager - Text Page per Shea Evans as per below:  On-Call/Text Page:      [email or web address]      password [email or web address]  If 7PM-7AM, please contact night-coverage [email or web address] Password [email or web address] 03/13/2015, 1:22 PM   LOS: 2 days

## 2015-03-13 NOTE — Clinical Documentation Improvement (Signed)
Internal Medicine  Can the diagnosis of Hypotension be further specified? Please document response in next progress note NOT in BPA drop down box. Thanks!   Hypotension secondary to AKI  Septic Shock  Hypovolemic Shock  Other Condition  Clinically Undetermined  Supporting Information:  BP's were running 87/52 with Map of 64,   91/47 with Map of 61  Serial Lactates run were: 3.05, 2.0 and 2.7  Fluid Resuscitation given  IV Cefepime q12h and IV Vancomycin q24h initiated  Please exercise your independent, professional judgment when responding. A specific answer is not anticipated or expected.  Thank You, Zoila Shutter RN, BSN, [place] [phone]; Cell: [phone]

## 2015-03-14 DIAGNOSIS — F0390 Unspecified dementia without behavioral disturbance: Secondary | ICD-10-CM

## 2015-03-14 LAB — BASIC METABOLIC PANEL
Anion gap: 8 (ref 5–15)
BUN: 20 mg/dL (ref 6–20)
CALCIUM: 9.3 mg/dL (ref 8.9–10.3)
CO2: 27 mmol/L (ref 22–32)
CREATININE: 1.35 mg/dL — AB (ref 0.61–1.24)
Chloride: 107 mmol/L (ref 101–111)
GFR calc Af Amer: 54 mL/min — ABNORMAL LOW (ref >60)
GFR calc non Af Amer: 47 mL/min — ABNORMAL LOW (ref >60)
GLUCOSE: 186 mg/dL — AB (ref 65–99)
Potassium: 4.2 mmol/L (ref 3.5–5.1)
Sodium: 142 mmol/L (ref 135–145)

## 2015-03-14 LAB — CBC
HCT: 36.7 % — ABNORMAL LOW (ref 39.0–52.0)
Hemoglobin: 11.4 g/dL — ABNORMAL LOW (ref 13.0–17.0)
MCH: 26.2 pg (ref 26.0–34.0)
MCHC: 31.1 g/dL (ref 30.0–36.0)
MCV: 84.4 fL (ref 78.0–100.0)
PLATELETS: 196 10*3/uL (ref 150–400)
RBC: 4.35 MIL/uL (ref 4.22–5.81)
RDW: 18.3 % — ABNORMAL HIGH (ref 11.5–15.5)
WBC: 8.3 10*3/uL (ref 4.0–10.5)

## 2015-03-14 LAB — GLUCOSE, CAPILLARY
GLUCOSE-CAPILLARY: 160 mg/dL — AB (ref 65–99)
Glucose-Capillary: 214 mg/dL — ABNORMAL HIGH (ref 65–99)
Glucose-Capillary: 160 mg/dL — ABNORMAL HIGH (ref 65–99)
Glucose-Capillary: 151 mg/dL — ABNORMAL HIGH (ref 65–99)
Glucose-Capillary: 136 mg/dL — ABNORMAL HIGH (ref 65–99)
Glucose-Capillary: 183 mg/dL — ABNORMAL HIGH (ref 65–99)

## 2015-03-14 MED ORDER — AMLODIPINE BESYLATE 5 MG PO TABS
5.0000 mg | ORAL_TABLET | Freq: Every day | ORAL | Status: DC
  Administered 2015-03-14 – 2015-03-15 (×2): 5 mg via ORAL
  Filled 2015-03-14 (×2): qty 1

## 2015-03-14 NOTE — Progress Notes (Signed)
[name] [id number] Admission Data: 03/14/2015 2:19 PM Attending Provider: Florencia Reasons, MD  [id number] Chad Augusta, MD Consults/ Treatment Team:    Chad [name] is a 82 y.o. male patient transferred from [facility] alert  & orientated  X 3,  Prior, VSS - Blood pressure 156/68, pulse 71, temperature 97.8 F (36.6 C), temperature source Oral, resp. rate 18, height 5\' 9"  (1.753 m), weight 97.3 kg (214 lb 8.1 oz), SpO2 96 %.,Room Air nasal cannular, no c/o shortness of breath, no c/o chest pain, no distress noted.   IV site WDL: left AC and Right AC with a transparent dsg that's clean dry and intact. NS infusing at 70mL/h  Allergies:   Allergies  Allergen Reactions  . Moxifloxacin Anaphylaxis    Avelox REACTION: anaphylaxis  . Sulfonamide Derivatives     REACTION: hives  . Timolol Maleate     Causes severe chest congestion     Past Medical History  Diagnosis Date  . Fatty liver disease, nonalcoholic   . Coronary artery stenosis   . Diverticulosis of colon   . Diaphragmatic hernia without mention of obstruction or gangrene   . Dyspnea   . Personal history of other malignant neoplasm of skin   . Barrett's esophagus   . TIA (transient ischemic attack) 1998 X "a few"  . Hyperlipidemia   . Acid reflux disease   . Glaucoma   . HTN (hypertension)   . RENAL CALCULUS, HX OF 08/28/2008  . CAROTID ARTERY STENOSIS, BILATERAL 08/27/2008  . Esophageal stenosis   . COPD (chronic obstructive pulmonary disease) ([place])     "severe" (12/20/2013)  . Chronic bronchitis ([place])     "most q yr; he's had it several times" (12/20/2013)  . Pneumonia 1990's X 4  . Type II diabetes mellitus ([place])   . History of blood transfusion 2007    "related to OR"  . History of hiatal hernia     "repaired when esophagus removed"  . History of stomach ulcers   . Sinus headache     "seasonal"  . Arthritis     "all over"  . Kidney stones     "passed them all" (12/20/2013)  . Basal cell carcinoma of nose    Pt  orientation to unit, room and routine. Information packet given to patient/family and safety video watched.  Admission INP armband ID verified with patient/family, and in place. SR up x 2, fall risk assessment complete with Patient and family verbalizing understanding of risks associated with falls. Pt verbalizes an understanding of how to use the call bell and to call for help before getting out of bed.  Skin, clean-dry- intact without evidence of bruising, or skin tears.  Skin is dry. No evidence of skin break down noted on exam. Skin assessment done with Zenon Mayo RN.  Will cont to monitor and assist as needed.  Dorita Fray, RN 03/14/2015  12:30PM

## 2015-03-14 NOTE — Evaluation (Signed)
Clinical/Bedside Swallow Evaluation Patient Details  Name: Chad [name] MRN: [id number] Date of Birth: 1929-11-07  Today's Date: 03/14/2015 Time: SLP Start Time (ACUTE ONLY): 1423 SLP Stop Time (ACUTE ONLY): 1434 SLP Time Calculation (min) (ACUTE ONLY): 11 min  Past Medical History:  Past Medical History  Diagnosis Date  . Fatty liver disease, nonalcoholic   . Coronary artery stenosis   . Diverticulosis of colon   . Diaphragmatic hernia without mention of obstruction or gangrene   . Dyspnea   . Personal history of other malignant neoplasm of skin   . Barrett's esophagus   . TIA (transient ischemic attack) 1998 X "a few"  . Hyperlipidemia   . Acid reflux disease   . Glaucoma   . HTN (hypertension)   . RENAL CALCULUS, HX OF 08/28/2008  . CAROTID ARTERY STENOSIS, BILATERAL 08/27/2008  . Esophageal stenosis   . COPD (chronic obstructive pulmonary disease) ([place])     "severe" (12/20/2013)  . Chronic bronchitis ([place])     "most q yr; he's had it several times" (12/20/2013)  . Pneumonia 1990's X 4  . Type II diabetes mellitus ([place])   . History of blood transfusion 2007    "related to OR"  . History of hiatal hernia     "repaired when esophagus removed"  . History of stomach ulcers   . Sinus headache     "seasonal"  . Arthritis     "all over"  . Kidney stones     "passed them all" (12/20/2013)  . Basal cell carcinoma of nose    Past Surgical History:  Past Surgical History  Procedure Laterality Date  . Esophagectomy  2007    with stomach pull through  . Pars plana vitrectomy Bilateral 01/2007-05/2007  . Ventral hernia repair  02/2006  . Knee arthroscopy Bilateral 04/1989; 08/1996; 06/1999    "right; left; left"  . Carotid endarterectomy Bilateral 02/1996-03/1996  . Shoulder arthroscopy Right 04/1998  . Mohs surgery  ?2002    "tip of nose; basal cell"  . Nasal polyp excision  2005    "benign"  . Hernia repair    . Cataract extraction w/ intraocular lens implant Bilateral  04/2007 - 09/2007    "left-right"  . Glaucoma surgery Right 11/2008    "implanted drain tube"  . Esophagogastroduodenoscopy (egd) with esophageal dilation  10/2011  . Eye surgery    . Eye surgery Right 04/2006; 08/2007    "had gas bubbles put in"  . Eye surgery Right 01/2007    "air bubble"  . Eye surgery Left 12/12/2013    "cleaned his implant w/laser"   HPI:  82yo M Hx Moderate Demenita, TIA, COPD, DM2, HTN, HLD, bilateral Carotid Artery Stenosis, Fatty Liver Dz Nonalcoholic, Gastric Ulcers, Barrett's esophagitis, Diaphragmatic Hernia, Nephrolithiasis, and Basal Cell Carcinoma of nose who presented to the ED with fever 102.2 and weakness. In the ED chest xray showed LUL pna. Swallow evaluation ordered to assess for aspiration risk. Barium swallow complete in 2001 indicating a moderate hiatal hernia, extensive reflux.    Assessment / Plan / Recommendation Clinical Impression  Patient presents with a functional oropharyngeal swallow without evidence of aspiration. Occassional belching noted consistent with esophageal history. No SLP f/u recommended at this time. If PNA becomes recurrent in nature, would consider instrumental testing given history of hiatal hernia, reflux, and Barret's esophagus.     Aspiration Risk  Mild aspiration risk    Diet Recommendation Regular;No liquids   Liquid Administration via: Cup;Straw Medication Administration:  Whole meds with liquid Supervision: Patient able to self feed Compensations: Slow rate;Small sips/bites Postural Changes: Seated upright at 90 degrees;Remain upright for at least 30 minutes after po intake    Other  Recommendations Oral Care Recommendations: Oral care BID   Follow up Recommendations  None      Swallow Study   General HPI: 82yo M Hx Moderate Demenita, TIA, COPD, DM2, HTN, HLD, bilateral Carotid Artery Stenosis, Fatty Liver Dz Nonalcoholic, Gastric Ulcers, Barrett's esophagitis, Diaphragmatic Hernia, Nephrolithiasis, and Basal  Cell Carcinoma of nose who presented to the ED with fever 102.2 and weakness. In the ED chest xray showed LUL pna. Swallow evaluation ordered to assess for aspiration risk. Barium swallow complete in 2001 indicating a moderate hiatal hernia, extensive reflux.  Type of Study: Bedside Swallow Evaluation Previous Swallow Assessment: see HPI regarding esophageal history Diet Prior to this Study: Regular;Thin liquids Temperature Spikes Noted: No Respiratory Status: Room air History of Recent Intubation: No Behavior/Cognition: Alert;Cooperative;Pleasant mood Oral Cavity Assessment: Within Functional Limits Oral Care Completed by SLP: No Oral Cavity - Dentition: Adequate natural dentition Vision: Functional for self-feeding Self-Feeding Abilities: Able to feed self Patient Positioning: Upright in bed Baseline Vocal Quality: Hoarse (mild) Volitional Cough: Strong Volitional Swallow: Able to elicit    Oral/Motor/Sensory Function Overall Oral Motor/Sensory Function: Within functional limits   Ice Chips Ice chips: Not tested   Thin Liquid Thin Liquid: Within functional limits Presentation: Cup;Straw;Self Fed    Nectar Thick Nectar Thick Liquid: Not tested   Honey Thick Honey Thick Liquid: Not tested   Puree Puree: Within functional limits Presentation: Self Fed;Spoon   Solid   GO  Mehtab Dolberry MA, CCC-SLP [phone]  Solid: Within functional limits Presentation: [place] 03/14/2015,3:03 PM

## 2015-03-14 NOTE — Progress Notes (Signed)
[place] TEAM 1 - Stepdown/ICU TEAM PROGRESS NOTE  Chad [name] [id number] DOB: [birth year] DOA: 03/11/2015 PCP: Hoyt Koch, MD  Admit HPI / Brief Narrative: 82yo M Hx Moderate Demenita, TIA, COPD, DM2, HTN, HLD, bilateral Carotid Artery Stenosis, Fatty Liver Dz Nonalcoholic, Gastric Ulcers, Barrett's esophagitis, Diaphragmatic Hernia, Nephrolithiasis, and Basal Cell Carcinoma of nose who presented to the ED with fever 102.2 and weakness. Pt was seen by Dr. Melvyn Novas on 2/2 and rx'd Augmentin and prednisone taper for "sinusitis". Chest xray was unremarkable at that time.   In the ED chest xray showed LUL pna, BP 87/52, WBC 16.4, lactic acid 3.05.   HPI/Subjective: The patient reported feeling better, still some nonproductive cough during encounter, but no hypoxia, denies sob, no chest pain, no fever. bp elevated this am  Assessment/Plan:  Sepsis (elevated WBC, fever) secondary to LUL Pneumonia  Rapidly improving clinically - continue empiric antibiotics and volume resuscitation - begin ambulation and therapy evaluations Tachypnea has resolved, on room air, swallow eval to r/o aspiration risk.  COPD -Continue home Symbicort BID - well compensated at present, no wheezing on exam, on room air  Acute kidney injury -secondary to dehydration in setting of above - improving with hydration -cr remain slightly elevated, ua unremarkable, continue hydration, hold acei/lasix/metformin  Hx hypertension with hypotension on admit -improved with IVFs -bp elevated this am, resume norvasc.  DM Type 2 uncontrolled -11/2014 A1c 7.9 - CBG climbing - adjust medical therapy and follow, metformin held in the hospital  Dementia -Donepezil 10 mg QHS , oriented to person and place, not oriented to time, likely baseline  Large Ventral hernia: chronic, no pain, no n/v.  Code Status: DO NOT INTUBATE Family Communication: no family at bedside Disposition Plan: PT/OT/swallow evaluations -  anticipate discharge in 48-72 hours  Consultants: none  Procedures: non  Antibiotics: Cefepime 2/15 >2/17 Vancomycin 2/15 > 2/17 Zithro/rocephin from 2/17 DVT prophylaxis: SCDs  Objective: Blood pressure 176/76, pulse 69, temperature 97.9 F (36.6 C), temperature source Oral, resp. rate 19, height 5\' 9"  (1.753 m), weight 97.3 kg (214 lb 8.1 oz), SpO2 96 %.  Intake/Output Summary (Last 24 hours) at 03/14/15 0857 Last data filed at 03/14/15 0500  Gross per 24 hour  Intake    600 ml  Output    750 ml  Net   -150 ml   Exam: General: No acute respiratory distress Lungs: Clear to auscultation bilaterally without wheezes or crackles Cardiovascular: Regular rate and rhythm without murmur gallop or rub normal S1 and S2 Abdomen: Nontender, nondistended, soft, bowel sounds positive, no rebound, no ascites, no appreciable mass Extremities: No significant cyanosis, clubbing, or edema bilateral lower extremities Neuro: baseline dementia  Data Reviewed:  Basic Metabolic Panel:  Recent Labs Lab 03/11/15 1010 03/12/15 0100 03/14/15 0220  NA 139 141 142  K 4.7 3.9 4.2  CL 103 110 107  CO2 23 21* 27  GLUCOSE 281* 226* 186*  BUN 20 16 20   CREATININE 1.67* 1.19 1.35*  CALCIUM 10.2 9.0 9.3    CBC:  Recent Labs Lab 03/11/15 1010 03/12/15 0100 03/14/15 0220  WBC 16.4* 11.2* 8.3  NEUTROABS 13.7*  --   --   HGB 12.1* 10.7* 11.4*  HCT 39.7 33.6* 36.7*  MCV 84.1 84.8 84.4  PLT 200 163 196    Liver Function Tests:  Recent Labs Lab 03/11/15 1010  AST 15  ALT 13*  ALKPHOS 63  BILITOT 0.7  PROT 6.7  ALBUMIN 3.5   Coags:  Recent Labs Lab 03/11/15 1815  INR 1.08    Recent Labs Lab 03/11/15 1815  APTT 33   CBG:  Recent Labs Lab 03/13/15 1615 03/13/15 1954 03/14/15 0001 03/14/15 0454 03/14/15 0820  GLUCAP 240* 231* 160* 214* 160*    Recent Results (from the past 240 hour(s))  Culture, blood (routine x 2)     Status: None (Preliminary result)    Collection Time: 03/11/15 10:10 AM  Result Value Ref Range Status   Specimen Description BLOOD LEFT HAND  Final   Special Requests IN PEDIATRIC BOTTLE 1CC  Final   Culture NO GROWTH 2 DAYS  Final   Report Status PENDING  Incomplete  Culture, blood (routine x 2)     Status: None (Preliminary result)   Collection Time: 03/11/15 10:15 AM  Result Value Ref Range Status   Specimen Description BLOOD RIGHT ANTECUBITAL  Final   Special Requests BOTTLES DRAWN AEROBIC AND ANAEROBIC 5CC  Final   Culture NO GROWTH 2 DAYS  Final   Report Status PENDING  Incomplete  Urine culture     Status: None   Collection Time: 03/11/15  1:17 PM  Result Value Ref Range Status   Specimen Description URINE, RANDOM  Final   Special Requests NONE  Final   Culture NO GROWTH 1 DAY  Final   Report Status 03/12/2015 FINAL  Final  MRSA PCR Screening     Status: None   Collection Time: 03/11/15  5:24 PM  Result Value Ref Range Status   MRSA by PCR NEGATIVE NEGATIVE Final    Comment:        The GeneXpert MRSA Assay (FDA approved for NASAL specimens only), is one component of a comprehensive MRSA colonization surveillance program. It is not intended to diagnose MRSA infection nor to guide or monitor treatment for MRSA infections.      Studies:   Recent x-ray studies have been reviewed in detail by the Attending Physician  Scheduled Meds:  Scheduled Meds: . amLODipine  5 mg Oral Daily  . aspirin  81 mg Oral Daily  . azithromycin  500 mg Oral Daily  . brimonidine  1 drop Left Eye BID  . budesonide-formoterol  2 puff Inhalation BID  . cefTRIAXone (ROCEPHIN)  IV  1 g Intravenous Q24H  . dextromethorphan-guaiFENesin  1 tablet Oral BID  . donepezil  10 mg Oral q morning - 10a  . enoxaparin (LOVENOX) injection  40 mg Subcutaneous Q24H  . insulin aspart  0-5 Units Subcutaneous QHS  . insulin aspart  0-9 Units Subcutaneous TID WC  . mirtazapine  7.5 mg Oral QHS  . pantoprazole  40 mg Oral Daily    Time  spent on care of this patient: 35 mins   Madalena Kesecker , MD PhD [phone]  [facility] Office  [phone] Pager - Text Page per Amion as per below:  On-Call/Text Page:      [email or web address]      password [email or web address]  If 7PM-7AM, please contact night-coverage [email or web address] Password [facility] 03/14/2015, 8:57 AM   LOS: 3 days

## 2015-03-14 NOTE — Evaluation (Signed)
Physical Therapy Evaluation Patient Details Name: Chad [name] MRN: [id number] DOB: 1929-03-21 Today's Date: 03/14/2015   History of Present Illness  82yo M Hx Moderate Demenita, TIA, COPD, DM2, HTN, HLD, bilateral Carotid Artery Stenosis, Fatty Liver Dz Nonalcoholic, Gastric Ulcers, Barrett's esophagitis, Diaphragmatic Hernia, Nephrolithiasis, and Basal Cell Carcinoma of nose who presented to the ED with fever 102.2 and weakness. In the ED chest xray showed LUL pna. Swallow evaluation ordered to assess for aspiration risk. Barium swallow complete in 2001 indicating a moderate hiatal hernia, extensive reflux.   Clinical Impression  Pt admitted with above diagnosis. Pt currently with functional limitations due to the deficits listed below (see PT Problem List). Pt was able to ambulate in hallway and states he is close to baseline.  Uses cane at baseline and wife assists at all times.  Will follow acutely but pt declines HHPT f/u even thought PT thinks a HHPT  safety eval might benefit pt.  Pt will benefit from skilled PT to increase their independence and safety with mobility to allow discharge to the venue listed below.      Follow Up Recommendations No PT follow up unless pt agrees to HHPT safety eval;Supervision/Assistance - 24 hour    Equipment Recommendations  None recommended by PT    Recommendations for Other Services       Precautions / Restrictions Precautions Precautions: Fall Restrictions Weight Bearing Restrictions: No      Mobility  Bed Mobility Overal bed mobility: Independent                Transfers Overall transfer level: Needs assistance Equipment used: None Transfers: Sit to/from Stand Sit to Stand: Min guard         General transfer comment: pt mildly unsteady on feet.  Needed HHA of 1.   Ambulation/Gait Ambulation/Gait assistance: Min guard Ambulation Distance (Feet): 250 Feet Assistive device: 1 person hand held assist Gait  Pattern/deviations: Step-through pattern;Decreased stride length;Drifts right/left   Gait velocity interpretation: Below normal speed for age/gender General Gait Details: Pt was able to ambulate with slightly unsteady gait at times.  Pt held to IV pole at times and other times he was able to ambulate without device.  Uses cane at home and wife assists him per pt.  He states he is close to baseline.    Stairs            Wheelchair Mobility    Modified Rankin (Stroke Patients Only)       Balance Overall balance assessment: Needs assistance;History of Falls Sitting-balance support: No upper extremity supported;Feet supported Sitting balance-Leahy Scale: Fair     Standing balance support: No upper extremity supported;During functional activity Standing balance-Leahy Scale: Fair Standing balance comment: can stand statically without UE support but cannot withstand challenges.               High level balance activites: Direction changes;Turns;Sudden stops High Level Balance Comments: min guard assist with challenges. Can self correct at times but other times needs steadying asssit.               Pertinent Vitals/Pain Pain Assessment: No/denies pain  Sats >90% on RA; other VSS.    Home Living Family/patient expects to be discharged to:: Private residence Living Arrangements: Spouse/significant other Available Help at Discharge: Family;Available 24 hours/day Type of Home: House Home Access: Stairs to enter Entrance Stairs-Rails: Right;Left;Can reach both Entrance Stairs-Number of Steps: 1 Home Layout: One level Home Equipment: Grab bars - toilet;Grab bars -  tub/shower;Walker - 2 wheels Additional Comments: wife provides 24 hour care for pt    Prior Function Level of Independence: Independent with assistive device(s);Needs assistance   Gait / Transfers Assistance Needed: pt reports he uses cane   ADL's / Homemaking Assistance Needed: min to mod assist for bathing  and dressing per pt by wife        Hand Dominance   Dominant Hand: Right    Extremity/Trunk Assessment   Upper Extremity Assessment: Defer to OT evaluation           Lower Extremity Assessment: Generalized weakness      Cervical / Trunk Assessment: Normal  Communication   Communication: No difficulties  Cognition Arousal/Alertness: Awake/alert Behavior During Therapy: Flat affect Overall Cognitive Status: History of cognitive impairments - at baseline                      General Comments      Exercises General Exercises - Lower Extremity Ankle Circles/Pumps: AROM;Both;10 reps;Seated Long Arc Quad: AROM;Both;5 reps;Seated      Assessment/Plan    PT Assessment Patient needs continued PT services  PT Diagnosis Generalized weakness   PT Problem List Decreased balance;Decreased activity tolerance;Decreased mobility;Decreased knowledge of use of DME;Decreased safety awareness;Decreased knowledge of precautions  PT Treatment Interventions DME instruction;Gait training;Therapeutic activities;Functional mobility training;Therapeutic exercise;Balance training;Patient/family education   PT Goals (Current goals can be found in the Care Plan section) Acute Rehab PT Goals Patient Stated Goal: to get better PT Goal Formulation: With patient Time For Goal Achievement: 03/28/15 Potential to Achieve Goals: Good    Frequency Min 3X/week   Barriers to discharge        Co-evaluation               End of Session Equipment Utilized During Treatment: Gait belt Activity Tolerance: Patient limited by fatigue Patient left: in chair;with call bell/phone within reach;with chair alarm set Nurse Communication: Mobility status         Time: [id number] PT Time Calculation (min) (ACUTE ONLY): 24 min   Charges:   PT Evaluation $PT Eval Moderate Complexity: 1 Procedure PT Treatments $Gait Training: 8-22 mins   PT G CodesDenice Paradise 19-Mar-2015,  4:34 PM Hovanes Hymas,PT Acute Rehabilitation [phone] (pager)

## 2015-03-15 DIAGNOSIS — A419 Sepsis, unspecified organism: Secondary | ICD-10-CM

## 2015-03-15 DIAGNOSIS — E119 Type 2 diabetes mellitus without complications: Secondary | ICD-10-CM

## 2015-03-15 DIAGNOSIS — E785 Hyperlipidemia, unspecified: Secondary | ICD-10-CM

## 2015-03-15 DIAGNOSIS — I10 Essential (primary) hypertension: Secondary | ICD-10-CM

## 2015-03-15 DIAGNOSIS — J189 Pneumonia, unspecified organism: Secondary | ICD-10-CM

## 2015-03-15 LAB — COMPREHENSIVE METABOLIC PANEL
ALK PHOS: 49 U/L (ref 38–126)
ALT: 11 U/L — AB (ref 17–63)
AST: 12 U/L — ABNORMAL LOW (ref 15–41)
Albumin: 2.7 g/dL — ABNORMAL LOW (ref 3.5–5.0)
Anion gap: 9 (ref 5–15)
BILIRUBIN TOTAL: 0.3 mg/dL (ref 0.3–1.2)
BUN: 15 mg/dL (ref 6–20)
CALCIUM: 9.3 mg/dL (ref 8.9–10.3)
CHLORIDE: 107 mmol/L (ref 101–111)
CO2: 26 mmol/L (ref 22–32)
CREATININE: 1.22 mg/dL (ref 0.61–1.24)
GFR, EST NON AFRICAN AMERICAN: 53 mL/min — AB (ref >60)
Glucose, Bld: 193 mg/dL — ABNORMAL HIGH (ref 65–99)
Potassium: 4.3 mmol/L (ref 3.5–5.1)
Sodium: 142 mmol/L (ref 135–145)
TOTAL PROTEIN: 5.6 g/dL — AB (ref 6.5–8.1)

## 2015-03-15 LAB — RESPIRATORY VIRUS PANEL
ADENOVIRUS: NEGATIVE
Influenza A: NEGATIVE
Influenza B: NEGATIVE
METAPNEUMOVIRUS: NEGATIVE
PARAINFLUENZA 1 A: NEGATIVE
Parainfluenza 2: NEGATIVE
Parainfluenza 3: NEGATIVE
RESPIRATORY SYNCYTIAL VIRUS A: NEGATIVE
RESPIRATORY SYNCYTIAL VIRUS B: NEGATIVE
Rhinovirus: NEGATIVE

## 2015-03-15 LAB — GLUCOSE, CAPILLARY
GLUCOSE-CAPILLARY: 167 mg/dL — AB (ref 65–99)
Glucose-Capillary: 181 mg/dL — ABNORMAL HIGH (ref 65–99)

## 2015-03-15 LAB — MAGNESIUM: MAGNESIUM: 1.6 mg/dL — AB (ref 1.7–2.4)

## 2015-03-15 MED ORDER — AZITHROMYCIN 500 MG PO TABS: 500.0000 mg | ORAL_TABLET | Freq: Every day | ORAL | Status: DC

## 2015-03-15 MED ORDER — CEFUROXIME AXETIL 250 MG PO TABS: 250.0000 mg | ORAL_TABLET | Freq: Two times a day (BID) | ORAL | Status: DC

## 2015-03-15 NOTE — Discharge Summary (Addendum)
Physician Discharge Summary  Chad [name] [id number] DOB: [birth year] DOA: 03/11/2015  PCP: Hoyt Koch, MD  Admit date: 03/11/2015 Discharge date: 03/15/2015  Time spent: 20 minutes  Recommendations for Outpatient Follow-up:  1. Follow up with PCP in 2-3 weeks  2. Per Radiology, recommend repeat CXR in 3-4 weeks to ensure resolution and exclude underlying malignancy  Discharge Diagnoses:  Principal Problem:   Sepsis due to pneumonia [facility]) Active Problems:   HLD (hyperlipidemia)   Essential hypertension   COPD GOLD II    Diabetes mellitus without complication ([place])   Moderate dementia with behavioral disturbance   Pneumonia   Sepsis ([place])   Acute kidney injury [facility])   Discharge Condition: Imroved  Diet recommendation: diabetic  Filed Weights   03/11/15 0945 03/11/15 1730  Weight: 81.647 kg (180 lb) 97.3 kg (214 lb 8.1 oz)    History of present illness:  Please review dictated H and P from 2/15 for details. Briefly,   Hospital Course:  82yo M Hx Moderate Demenita, TIA, COPD, DM2, HTN, HLD, bilateral Carotid Artery Stenosis, Fatty Liver Dz Nonalcoholic, Gastric Ulcers, Barrett's esophagitis, Diaphragmatic Hernia, Nephrolithiasis, and Basal Cell Carcinoma of nose who presented to the ED with fever 102.2 and weakness. Pt was seen by Dr. Melvyn Novas on 2/2 and rx'd Augmentin and prednisone taper for "sinusitis". Chest xray was unremarkable at that time  Procedures: Sepsis (elevated WBC, fever) secondary to LUL Pneumonia  Rapidly improved clinically with vanc/zosyn and later azithromycin with rocephin and volume resuscitation Tachypnea has resolved, on room air Patient to complete course with PO azithromycin with ceftin Radiology recommendations for repeat CXR in 3-4 weeks to ensure resolution of pneumonia  COPD -Continued home Symbicort BID - well compensated at present, no wheezing on exam, on room air  Acute kidney injury -secondary to dehydration in setting of  above - improving with hydration -cr remain slightly elevated, ua unremarkable, continue hydration, hold acei/lasix/metformin  Hx hypertension with hypotension on admit -improved with IVFs -resume home meds on d/c  DM Type 2 uncontrolled -11/2014 A1c 7.9  -Patient to resume home meds on discharge  Dementia -Donepezil 10 mg QHS , oriented to person and place, not oriented to time, likely baseline  Large Ventral hernia: chronic, no pain, no n/v.  Discharge Exam: Filed Vitals:   03/15/15 0620 03/15/15 0740 03/15/15 0903 03/15/15 0905  BP: 169/71  157/61 157/61  Pulse: 67   76  Temp: 97.3 F (36.3 C)     TempSrc: Oral     Resp: 16     Height:      Weight:      SpO2: 96% 95%      General: Awake, in nad Cardiovascular: regular, s1, s2 Respiratory: normal resp effort, no wheezing  Discharge Instructions     Medication List    TAKE these medications        amLODipine 5 MG tablet  Commonly known as:  NORVASC  Take 1 tablet (5 mg total) by mouth daily.     aspirin 81 MG tablet  Take 81 mg by mouth daily.     azithromycin 500 MG tablet  Commonly known as:  ZITHROMAX  Take 1 tablet (500 mg total) by mouth daily.  Start taking on:  03/16/2015     brimonidine 0.1 % Soln  Commonly known as:  ALPHAGAN P  Place 1 drop into the left eye 2 (two) times daily.     budesonide-formoterol 160-4.5 MCG/ACT inhaler  Commonly known as:  SYMBICORT  Take 2 puffs first thing in am and then another 2 puffs about 12 hours later.     cefUROXime 250 MG tablet  Commonly known as:  CEFTIN  Take 1 tablet (250 mg total) by mouth 2 (two) times daily with a meal.  Start taking on:  03/16/2015     cloNIDine 0.2 MG tablet  Commonly known as:  CATAPRES  1 tab by mouth as needed when BP is over 140     dextromethorphan-guaiFENesin 30-600 MG 12hr tablet  Commonly known as:  MUCINEX DM  Take 1 tablet by mouth 2 (two) times daily. Reported on 03/09/2015     diazepam 5 MG tablet  Commonly  known as:  VALIUM  Take 2.5 mg by mouth daily as needed (dizziness, inner ear). Reported on 03/09/2015     donepezil 10 MG tablet  Commonly known as:  ARICEPT  Take 1 tablet (10 mg total) by mouth at bedtime.     furosemide 40 MG tablet  Commonly known as:  LASIX  Take 40 mg by mouth.     HYDROcodone-homatropine 5-1.5 MG/5ML syrup  Commonly known as:  HYCODAN  Take 5 mLs by mouth every 6 (six) hours as needed for cough.     losartan 100 MG tablet  Commonly known as:  COZAAR  Take 1 tablet (100 mg total) by mouth daily.     metFORMIN 500 MG tablet  Commonly known as:  GLUCOPHAGE  Take 1 tablet (500 mg total) by mouth daily with breakfast.     mirtazapine 15 MG tablet  Commonly known as:  REMERON  Take 1 tablet (15 mg total) by mouth at bedtime.     omeprazole 20 MG capsule  Commonly known as:  PRILOSEC  Take 40 mg by mouth 2 (two) times daily.     Potassium Gluconate 550 (90 K) MG Tabs  Take by mouth daily. Take 2 tablets daily     PROAIR HFA 108 (90 Base) MCG/ACT inhaler  Generic drug:  albuterol  Inhale 1-2 puffs into the lungs every 6 (six) hours as needed. Reported on 03/09/2015     albuterol (2.5 MG/3ML) 0.083% nebulizer solution  Commonly known as:  PROVENTIL  1 vial in nebulizer every 6 hours and as needed for wheezing/shortness of breath     promethazine 25 MG tablet  Commonly known as:  PHENERGAN  Reported on 03/09/2015     valACYclovir 500 MG tablet  Commonly known as:  VALTREX  Take 2 tablets (1,000 mg total) by mouth every 8 (eight) hours as needed. For out breaks     vitamin B-12 1000 MCG tablet  Commonly known as:  CYANOCOBALAMIN  Take 1 tablet (1,000 mcg total) by mouth daily.     Vitamin D 1000 units capsule  Take 1,000 Units by mouth daily.       Allergies  Allergen Reactions  . Moxifloxacin Anaphylaxis    Avelox REACTION: anaphylaxis  . Sulfonamide Derivatives     REACTION: hives  . Timolol Maleate     Causes severe chest congestion    Follow-up Information    Follow up with Hoyt Koch, MD. Schedule an appointment as soon as possible for a visit in 2 weeks.   Specialty:  Internal Medicine   Why:  Hospital follow up   Contact information:   [place] [id number] [phone]        The results of significant diagnostics from this hospitalization (including imaging, microbiology, ancillary and  laboratory) are listed below for reference.    Significant Diagnostic Studies: Dg Chest 2 View  03/11/2015  CLINICAL DATA:  Fever, cough since last night EXAM: CHEST  2 VIEW COMPARISON:  02/26/2015 FINDINGS: There is left upper lobe airspace disease. There is no pleural effusion or pneumothorax. The heart and mediastinal contours are unremarkable. The osseous structures are unremarkable. IMPRESSION: Left upper lobe pneumonia. Followup PA and lateral chest X-ray is recommended in 3-4 weeks following trial of antibiotic therapy to ensure resolution and exclude underlying malignancy. Electronically Signed   By: Kathreen Devoid   On: 03/11/2015 11:03   Dg Chest 2 View  02/26/2015  CLINICAL DATA:  Congestion, sinus congestion,x intermittent 1 mo, fever yesterday, HTN, diabetic, x-smoker, COPD. EXAM: CHEST  2 VIEW COMPARISON:  01/30/2015 FINDINGS: Opacity superimposed on the cardiac silhouette on the frontal view and posterior to the cardiac silhouette on the lateral view is stable consistent with postoperative change from the previous esophagectomy. No evidence of pneumonia or pulmonary edema. No pleural effusion or pneumothorax. The cardiac silhouette is normal in size. No mediastinal or hilar masses or convincing adenopathy. The bony thorax is intact. IMPRESSION: No acute cardiopulmonary disease. Stable appearance from the prior study. Electronically Signed   By: Lajean Manes M.D.   On: 02/26/2015 15:01   [place] Wo Cm  03/02/2015  CLINICAL DATA:  82 year old male with cough and fever with runny  nose. History of sinusitis in the past. EXAM: CT PARANASAL SINUS LIMITED WITHOUT CONTRAST TECHNIQUE: Non-contiguous multidetector CT images of the paranasal sinuses were obtained in a single plane without contrast. COMPARISON:  03/16/2014 head CT FINDINGS: Polypoid opacification right frontal sinus. Left frontal sinus clear. Opacification anterior right ethmoid sinus air cells with remainder of ethmoid sinuses clear. Minimal mucosal thickening inferior aspect maxillary sinuses. Sphenoid sinus air cells are clear.  Border formed by carotid canal. Visualized mastoid air cells and middle ear cavities are clear. Polypoid lesion within the left nasal vault. Post right orbital surgery. Intracranial atrophy and chronic small vessel disease type changes. IMPRESSION: Polypoid opacification right frontal sinus. Left frontal sinus clear. Opacification anterior right ethmoid sinus air cells with remainder of ethmoid sinuses clear. Minimal mucosal thickening inferior aspect maxillary sinuses. Sphenoid sinus air cells are clear. 1.5 cm polypoid lesion within the left nasal vault. Electronically Signed   By: Genia Del M.D.   On: 03/02/2015 13:57    Microbiology: Recent Results (from the past 240 hour(s))  Culture, blood (routine x 2)     Status: None (Preliminary result)   Collection Time: 03/11/15 10:10 AM  Result Value Ref Range Status   Specimen Description BLOOD LEFT HAND  Final   Special Requests IN PEDIATRIC BOTTLE 1CC  Final   Culture NO GROWTH 4 DAYS  Final   Report Status PENDING  Incomplete  Culture, blood (routine x 2)     Status: None (Preliminary result)   Collection Time: 03/11/15 10:15 AM  Result Value Ref Range Status   Specimen Description BLOOD RIGHT ANTECUBITAL  Final   Special Requests BOTTLES DRAWN AEROBIC AND ANAEROBIC 5CC  Final   Culture NO GROWTH 4 DAYS  Final   Report Status PENDING  Incomplete  Urine culture     Status: None   Collection Time: 03/11/15  1:17 PM  Result Value Ref  Range Status   Specimen Description URINE, RANDOM  Final   Special Requests NONE  Final   Culture NO GROWTH 1 DAY  Final   Report  Status 03/12/2015 FINAL  Final  MRSA PCR Screening     Status: None   Collection Time: 03/11/15  5:24 PM  Result Value Ref Range Status   MRSA by PCR NEGATIVE NEGATIVE Final    Comment:        The GeneXpert MRSA Assay (FDA approved for NASAL specimens only), is one component of a comprehensive MRSA colonization surveillance program. It is not intended to diagnose MRSA infection nor to guide or monitor treatment for MRSA infections.   Respiratory virus panel     Status: None   Collection Time: 03/11/15  6:46 PM  Result Value Ref Range Status   Source - RVPAN NASOPHARYNGEAL  Corrected   Respiratory Syncytial Virus A Negative Negative Final   Respiratory Syncytial Virus B Negative Negative Final   Influenza A Negative Negative Final   Influenza B Negative Negative Final   Parainfluenza 1 Negative Negative Final   Parainfluenza 2 Negative Negative Final   Parainfluenza 3 Negative Negative Final   Metapneumovirus Negative Negative Final   Rhinovirus Negative Negative Final   Adenovirus Negative Negative Final    Comment: (NOTE) Performed At: [facility] [place], [place] [id number] Lindon Romp MD [id number]      Labs: Basic Metabolic Panel:  Recent Labs Lab 03/11/15 1010 03/12/15 0100 03/14/15 0220 03/15/15 0450  NA 139 141 142 142  K 4.7 3.9 4.2 4.3  CL 103 110 107 107  CO2 23 21* 27 26  GLUCOSE 281* 226* 186* 193*  BUN 20 16 20 15   CREATININE 1.67* 1.19 1.35* 1.22  CALCIUM 10.2 9.0 9.3 9.3  MG  --   --   --  1.6*   Liver Function Tests:  Recent Labs Lab 03/11/15 1010 03/15/15 0450  AST 15 12*  ALT 13* 11*  ALKPHOS 63 49  BILITOT 0.7 0.3  PROT 6.7 5.6*  ALBUMIN 3.5 2.7*   No results for input(s): LIPASE, AMYLASE in the last 168 hours. No results for input(s): AMMONIA in the last 168  hours. CBC:  Recent Labs Lab 03/11/15 1010 03/12/15 0100 03/14/15 0220  WBC 16.4* 11.2* 8.3  NEUTROABS 13.7*  --   --   HGB 12.1* 10.7* 11.4*  HCT 39.7 33.6* 36.7*  MCV 84.1 84.8 84.4  PLT 200 163 196   Cardiac Enzymes: No results for input(s): CKTOTAL, CKMB, CKMBINDEX, TROPONINI in the last 168 hours. BNP: BNP (last 3 results) No results for input(s): BNP in the last 8760 hours.  ProBNP (last 3 results) No results for input(s): PROBNP in the last 8760 hours.  CBG:  Recent Labs Lab 03/14/15 1305 03/14/15 1650 03/14/15 2119 03/15/15 0814 03/15/15 1220  GLUCAP 151* 136* 183* 167* 181*   Signed:  Kelcee Bjorn K  [facility] 03/15/2015, 5:56 PM

## 2015-03-15 NOTE — Progress Notes (Signed)
Nsg Discharge Note  Admit Date:  03/11/2015 Discharge date: 03/15/2015   Bjorn Loser to be D/C'd Home per MD order.  AVS completed.  Copy for chart, and copy for patient signed, and dated. Patient and spouse able to verbalize understanding.  Discharge Medication:   Medication List    TAKE these medications        amLODipine 5 MG tablet  Commonly known as:  NORVASC  Take 1 tablet (5 mg total) by mouth daily.     aspirin 81 MG tablet  Take 81 mg by mouth daily.     azithromycin 500 MG tablet  Commonly known as:  ZITHROMAX  Take 1 tablet (500 mg total) by mouth daily.  Start taking on:  03/16/2015     brimonidine 0.1 % Soln  Commonly known as:  ALPHAGAN P  Place 1 drop into the left eye 2 (two) times daily.     budesonide-formoterol 160-4.5 MCG/ACT inhaler  Commonly known as:  SYMBICORT  Take 2 puffs first thing in am and then another 2 puffs about 12 hours later.     cefUROXime 250 MG tablet  Commonly known as:  CEFTIN  Take 1 tablet (250 mg total) by mouth 2 (two) times daily with a meal.  Start taking on:  03/16/2015     cloNIDine 0.2 MG tablet  Commonly known as:  CATAPRES  1 tab by mouth as needed when BP is over 140     dextromethorphan-guaiFENesin 30-600 MG 12hr tablet  Commonly known as:  MUCINEX DM  Take 1 tablet by mouth 2 (two) times daily. Reported on 03/09/2015     diazepam 5 MG tablet  Commonly known as:  VALIUM  Take 2.5 mg by mouth daily as needed (dizziness, inner ear). Reported on 03/09/2015     donepezil 10 MG tablet  Commonly known as:  ARICEPT  Take 1 tablet (10 mg total) by mouth at bedtime.     furosemide 40 MG tablet  Commonly known as:  LASIX  Take 40 mg by mouth.     HYDROcodone-homatropine 5-1.5 MG/5ML syrup  Commonly known as:  HYCODAN  Take 5 mLs by mouth every 6 (six) hours as needed for cough.     losartan 100 MG tablet  Commonly known as:  COZAAR  Take 1 tablet (100 mg total) by mouth daily.     metFORMIN 500 MG tablet   Commonly known as:  GLUCOPHAGE  Take 1 tablet (500 mg total) by mouth daily with breakfast.     mirtazapine 15 MG tablet  Commonly known as:  REMERON  Take 1 tablet (15 mg total) by mouth at bedtime.     omeprazole 20 MG capsule  Commonly known as:  PRILOSEC  Take 40 mg by mouth 2 (two) times daily.     Potassium Gluconate 550 (90 K) MG Tabs  Take by mouth daily. Take 2 tablets daily     PROAIR HFA 108 (90 Base) MCG/ACT inhaler  Generic drug:  albuterol  Inhale 1-2 puffs into the lungs every 6 (six) hours as needed. Reported on 03/09/2015     albuterol (2.5 MG/3ML) 0.083% nebulizer solution  Commonly known as:  PROVENTIL  1 vial in nebulizer every 6 hours and as needed for wheezing/shortness of breath     promethazine 25 MG tablet  Commonly known as:  PHENERGAN  Reported on 03/09/2015     valACYclovir 500 MG tablet  Commonly known as:  VALTREX  Take 2 tablets (1,000  mg total) by mouth every 8 (eight) hours as needed. For out breaks     vitamin B-12 1000 MCG tablet  Commonly known as:  CYANOCOBALAMIN  Take 1 tablet (1,000 mcg total) by mouth daily.     Vitamin D 1000 units capsule  Take 1,000 Units by mouth daily.        Discharge Assessment: Filed Vitals:   03/15/15 0903 03/15/15 0905  BP: 157/61 157/61  Pulse:  76  Temp:    Resp:     Skin clean, dry and intact without evidence of skin break down, no evidence of skin tears noted. IV catheters discontinued with catheter tips intact. Site without signs and symptoms of complications - no redness or edema noted at insertion site, patient denies c/o pain - only slight tenderness at site.  Dressing with slight pressure applied. Condom cath removed. 658mL of clear yellow urine noted.   D/c Instructions-Education: Discharge instructions given to patient/family with verbalized understanding. D/c education completed with patient/family including follow up instructions, medication list, d/c activities limitations if  indicated, with other d/c instructions as indicated by MD - patient able to verbalize understanding, all questions fully answered. Patient instructed to return to ED, call 911, or call MD for any changes in condition.  Patient getting dressed with help from his wife. Patient belongings packed by spouse. RN informed family member to call out to RN once pt is is ready for a wheelchair to take him to their car. Spouse verbalized understanding.   Dorita Fray, RN 03/15/2015 3:31 PM

## 2015-03-16 ENCOUNTER — Telehealth: Payer: Self-pay | Admitting: *Deleted

## 2015-03-16 LAB — CULTURE, BLOOD (ROUTINE X 2)
CULTURE: NO GROWTH
Culture: NO GROWTH

## 2015-03-16 NOTE — Telephone Encounter (Signed)
Transition Care Management Follow-up Telephone Call   Date discharged? 03/15/15   How have you been since you were released from the hospital? Spoke with pt wife she states he is doing ok   Do you understand why you were in the hospital? YES   Do you understand the discharge instructions? YES   Where were you discharged to? Home   Items Reviewed:  Medications reviewed: YES  Allergies reviewed: YES  Dietary changes reviewed: YES  Referrals reviewed: NO referral needed   Functional Questionnaire:  Activities of Daily Living (ADLs):   She states he are independent in the following: ambulation, bathing and hygiene, feeding, continence, grooming, toileting and dressing States he doesn't require assistance    Any transportation issues/concerns?: NO   Any patient concerns? NO   Confirmed importance and date/time of follow-up visits scheduled YES, wife want to make the appt in 4 wks due to her having knee surgery. appt made 04/13/15  Provider Appointment booked with Dr. Sharlet Salina  Confirmed with patient if condition begins to worsen call PCP or go to the ER.  Patient was given the office number and encouraged to call back with question or concerns.  : YES

## 2015-03-26 ENCOUNTER — Ambulatory Visit: Payer: Medicare HMO | Admitting: Internal Medicine

## 2015-04-03 ENCOUNTER — Telehealth: Payer: Self-pay | Admitting: Neurology

## 2015-04-03 MED ORDER — MEMANTINE HCL-DONEPEZIL HCL ER 28-10 MG PO CP24: 1.0000 | ORAL_CAPSULE | Freq: Every day | ORAL | Status: DC

## 2015-04-03 NOTE — Telephone Encounter (Signed)
Returned Patsy's call. She states patient is doing well on medication and wants [place] to send in Rx to his pharmacy for Namzaric.

## 2015-04-03 NOTE — Telephone Encounter (Signed)
Pt's wife Tessie Fass called in regards to his prescription Aricept,Numenda/Dawn CB# [phone]

## 2015-04-07 ENCOUNTER — Other Ambulatory Visit: Payer: Self-pay | Admitting: Internal Medicine

## 2015-04-13 ENCOUNTER — Other Ambulatory Visit (INDEPENDENT_AMBULATORY_CARE_PROVIDER_SITE_OTHER): Payer: Medicare HMO

## 2015-04-13 ENCOUNTER — Ambulatory Visit (INDEPENDENT_AMBULATORY_CARE_PROVIDER_SITE_OTHER): Payer: Medicare HMO | Admitting: Internal Medicine

## 2015-04-13 ENCOUNTER — Ambulatory Visit (INDEPENDENT_AMBULATORY_CARE_PROVIDER_SITE_OTHER)
Admission: RE | Admit: 2015-04-13 | Discharge: 2015-04-13 | Disposition: A | Discharge status: Home / Self Care | Payer: Medicare HMO | Source: Ambulatory Visit | Attending: Internal Medicine | Admitting: Internal Medicine

## 2015-04-13 ENCOUNTER — Encounter: Payer: Self-pay | Admitting: Internal Medicine

## 2015-04-13 VITALS — BP 158/68 | HR 68 | Temp 98.1°F | Resp 14 | Ht 69.0 in | Wt 170.0 lb

## 2015-04-13 DIAGNOSIS — E119 Type 2 diabetes mellitus without complications: Secondary | ICD-10-CM

## 2015-04-13 DIAGNOSIS — R32 Unspecified urinary incontinence: Secondary | ICD-10-CM

## 2015-04-13 DIAGNOSIS — J181 Lobar pneumonia, unspecified organism: Principal | ICD-10-CM

## 2015-04-13 DIAGNOSIS — F03B18 Unspecified dementia, moderate, with other behavioral disturbance: Secondary | ICD-10-CM

## 2015-04-13 DIAGNOSIS — A419 Sepsis, unspecified organism: Secondary | ICD-10-CM

## 2015-04-13 DIAGNOSIS — J189 Pneumonia, unspecified organism: Secondary | ICD-10-CM

## 2015-04-13 DIAGNOSIS — F0391 Unspecified dementia with behavioral disturbance: Secondary | ICD-10-CM

## 2015-04-13 LAB — POCT URINALYSIS DIPSTICK
Bilirubin, UA: NEGATIVE
KETONES UA: NEGATIVE
LEUKOCYTES UA: NEGATIVE
Nitrite, UA: NEGATIVE
PH UA: 6
PROTEIN UA: NEGATIVE
RBC UA: NEGATIVE
Urobilinogen, UA: 1

## 2015-04-13 LAB — COMPREHENSIVE METABOLIC PANEL
ALT: 14 U/L (ref 0–53)
AST: 14 U/L (ref 0–37)
Albumin: 4.5 g/dL (ref 3.5–5.2)
Alkaline Phosphatase: 99 U/L (ref 39–117)
BUN: 26 mg/dL — ABNORMAL HIGH (ref 6–23)
CALCIUM: 10.4 mg/dL (ref 8.4–10.5)
CHLORIDE: 101 meq/L (ref 96–112)
CO2: 31 meq/L (ref 19–32)
Creatinine, Ser: 1.2 mg/dL (ref 0.40–1.50)
GFR: 61.4 mL/min (ref >60.00)
GLUCOSE: 239 mg/dL — AB (ref 70–99)
POTASSIUM: 4.6 meq/L (ref 3.5–5.1)
Sodium: 142 mEq/L (ref 135–145)
Total Bilirubin: 0.5 mg/dL (ref 0.2–1.2)
Total Protein: 7.4 g/dL (ref 6.0–8.3)

## 2015-04-13 LAB — HEMOGLOBIN A1C: Hgb A1c MFr Bld: 9 % — ABNORMAL HIGH (ref 4.6–6.5)

## 2015-04-13 MED ORDER — POTASSIUM CHLORIDE CRYS ER 20 MEQ PO TBCR: 20.0000 meq | EXTENDED_RELEASE_TABLET | Freq: Every day | ORAL | Status: DC

## 2015-04-13 NOTE — Assessment & Plan Note (Signed)
Taking the depression medicine which is helping some per his wife and the combo namzaric which she cannot tell much difference. His dementia is moderately severe and I suspect he is forgetting to urinate which could be causing his incontinence. Ruling out infection today.

## 2015-04-13 NOTE — Progress Notes (Signed)
Pre visit review using our clinic review tool, if applicable. No additional management support is needed unless otherwise documented below in the visit note. 

## 2015-04-13 NOTE — Assessment & Plan Note (Signed)
Checking HgA1c and CMP today, taking metformin daily and no more diarrhea. Given his severe dementia I would not recommend to increase therapy and accept <8 as goal.

## 2015-04-13 NOTE — Assessment & Plan Note (Signed)
Checking CXR for clearance, breathing better and less cough now. Still using his inhalers and nebulizers as needed. Using hydromet at night time prn.

## 2015-04-13 NOTE — Assessment & Plan Note (Signed)
I suspect due to memory impairment and talked to him and his wife about scheduling toileting times to make sure he is not getting filled bladder.

## 2015-04-13 NOTE — Patient Instructions (Signed)
We are checking the urine today for infection. I would recommend to try having him go to the bathroom to try to urinate at least 3-4 times per day to avoid accidents.   We are getting the labs and chest x-ray today as well and will call you back with the results.

## 2015-04-13 NOTE — Progress Notes (Signed)
   Subjective:    Patient ID: Chad Chad [name], male    DOB: 19-May-1929, 82 y.o.   MRN: [id number]  HPI The patient is an 82 YO man coming in for hospital follow up (in for LUL pneumonia, treated with antibiotics and steroids, doing better with SOB and less cough now). No fevers or chills since being home. Having some urinary incontinence and his schedule has been different as his wife has had a knee replacement in the last 3 weeks. He does not always remember to go to the bathroom. His memory is poor and his wife helps with the history today. No other new concerns.   PMH, [facility], social history reviewed and updated.   Review of Systems  Unable to perform ROS: Dementia  Constitutional: Negative.   HENT: Negative for congestion, postnasal drip, rhinorrhea, sinus pressure and trouble swallowing.   Respiratory: Positive for cough. Negative for chest tightness, shortness of breath and wheezing.   Cardiovascular: Negative for chest pain, palpitations and leg swelling.  Gastrointestinal: Negative for nausea, vomiting, abdominal pain, diarrhea, constipation, blood in stool and abdominal distention.  Skin: Negative.   Neurological: Negative.       Objective:   Physical Exam  Constitutional: He appears well-developed and well-nourished.  HENT:  Chad [name]: Normocephalic and atraumatic.  Eyes: EOM are normal.  Neck: Normal range of motion.  Cardiovascular: Normal rate and regular rhythm.   Pulmonary/Chest: Effort normal. No respiratory distress. He has no wheezes. He has no rales.  Lung sounds improved from prior  Abdominal: Soft. Bowel sounds are normal. He exhibits no distension. There is no tenderness. There is no rebound.  Neurological: Coordination normal.  Skin: Skin is warm and dry.   Filed Vitals:   04/13/15 1434  BP: 158/68  Pulse: 68  Temp: 98.1 F (36.7 C)  TempSrc: Oral  Resp: 14  Height: 5\' 9"  (1.753 m)  Weight: 170 lb (77.111 kg)  SpO2: 96%      Assessment & Plan:

## 2015-04-23 ENCOUNTER — Ambulatory Visit (INDEPENDENT_AMBULATORY_CARE_PROVIDER_SITE_OTHER): Payer: Medicare HMO | Admitting: Internal Medicine

## 2015-04-23 ENCOUNTER — Encounter: Payer: Self-pay | Admitting: Internal Medicine

## 2015-04-23 VITALS — BP 104/62 | HR 77 | Ht 69.0 in | Wt 169.0 lb

## 2015-04-23 DIAGNOSIS — R05 Cough: Secondary | ICD-10-CM | POA: Diagnosis not present

## 2015-04-23 DIAGNOSIS — R059 Cough, unspecified: Secondary | ICD-10-CM

## 2015-04-23 DIAGNOSIS — J449 Chronic obstructive pulmonary disease, unspecified: Secondary | ICD-10-CM

## 2015-04-23 MED ORDER — GLYCOPYRROLATE-FORMOTEROL 9-4.8 MCG/ACT IN AERO: 2.0000 | INHALATION_SPRAY | Freq: Two times a day (BID) | RESPIRATORY_TRACT | Status: DC

## 2015-04-23 NOTE — Progress Notes (Signed)
Subjective:    Patient ID: Chad [name], male    DOB: 1930-01-08    MRN: [id number]  Brief patient profile:  85 yowm quit smoking x 1980 with GOLD II copd by pfts 06/10/08 and freq exac which improve transiently on steroids   History of Present Illness  07/02/2014  Acute ov/Anelia [name] re: aecopd/ ab plus ? Pseudoasthma  Chief Complaint  Patient presents with  . Acute Visit    Pt c/o wheezing and cough x 5 days. Cough is non prod. He has also started to have some nasal congestion over the past couple of days. He is using albuterol inhaler approx 3 x per day and has not had to use neb at all.   gradually worse cough/ subj wheeze since last prednisone /win a week or two of ov.  Worse day than noct/ comfortable at rest p saba but use way up over baseline rec Omeprazole Take 30- 60 min before your first and last meals of the day GERD diet  Stop powdered inhalers = spiriva / foradil and start symbicort 160 Take 2 puffs first thing in am and then another 2 puffs about 12 hours later.  Work on inhaler technique:   Only use your albuterol as a rescue medication Only use albuterol nebulizer up to every 4 hours if needed but goal is not need it all  Prednisone 10 mg take  4 each am x 2 days,   2 each am x 2 days,  1 each am x 2 days and stop     01/30/2015 acute extended ov/Chad [name] re: recurrent cough in pt with COPD II  Chief Complaint  Patient presents with  . Acute Visit    Coughing up greenish/yellow mucus. Chest congestion, nasal congestion. Has taken 2 boxes of Mucinex and 1 bottle of liquid Mucinex already.   was better p prev ov then Onset mid November 2016 acute  assoc with stuffy nose  Has flutter not using / poor insight into prns  Cough only  better after hyrdocodone/ no better p prednisone hfa poor  rec When sick with flare of cough/ congestion  mucinex dm 1200 mg every 12 hours and use the flutter as much as possible If having bad cough/ congestion in am > use nebulizer albuterol  first thing in am  Augmentin 875 mg take one pill twice daily  X 10 days - take at breakfast and supper with large glass of water.  It would help reduce the usual side effects (diarrhea and yeast infections) if you ate cultured yogurt at lunch.  Prednisone 10 mg take  4 each am x 2 days,   2 each am x 2 days,  1 each am x 2 days and stop  Work on inhaler technique:  relax and gently blow all the way out then take a nice smooth deep breath back in, triggering the inhaler at same time you start breathing in.  Hold for up to 5 seconds if you can. Blow out thru nose. Rinse and gargle with water when done Please remember to go to the  x-ray department downstairs for your tests - we will call you with the results when they are available. Please schedule a follow up office visit in 6 weeks, call sooner if needed      02/26/2015  f/u ov/Chad [name] re:  COPD II/ maint rx symb 160 2bid and prn saba Chief Complaint  Patient presents with  . Acute Visit    No fever as  of this AM. Denies any chills/sweats/nausea/vomiting. yesterday did cough up green phlem but non today. Had wheezing about 1 day ago.  was fine from 1/6 - 1/24 one episode early am sob > 911 > nl vitals, better s rx  Then 02/25/15 coughed up green for the first time    Changed prilosec to alternative rx 2/1 Rare perceived need for saba / not clear he used action plan  rec Augmentin 875 mg take one pill twice daily  X 10 days  Prilosec 20 mg Take 30- 60 min before your first and last meals of the day  For cough > mucinex dm 1200 up to twice daily and use the flutter as you can  Work on Product manager inhaler technique:        Admit date: 03/11/2015 Discharge date: 03/15/2015     Discharge Diagnoses:  Principal Problem:  Sepsis due to pneumonia [facility]) Active Problems:  HLD (hyperlipidemia)  Essential hypertension  COPD GOLD II   Diabetes mellitus without complication (HCC)  Moderate dementia with behavioral disturbance  Pneumonia   Sepsis ([place])  Acute kidney injury (HCC)> resolved    04/23/2015  f/u ov/Chad [name] re: GOLD II/ maint on symbicort 160 2bid  Chief Complaint  Patient presents with  . Follow-up    Breathing is doing well. No new co's today.   Not limited by breathing from desired activities  But very inactive    No obvious day to day or daytime variabilty or assoc increase sob or perceived need for saba or cp or chest tightness, subjective wheeze overt   hb symptoms. No unusual exp hx or h/o childhood pna/ asthma or knowledge of premature birth.  Sleeping ok without nocturnal  or early am exacerbation  of respiratory  c/o's or need for noct saba. Also denies any obvious fluctuation of symptoms with weather or environmental changes or other aggravating or alleviating factors except as outlined above   Current Medications, Allergies, Complete Past Medical History, Past Surgical History, Family History, and Social History were reviewed in [employer] record.  ROS  The following are not active complaints unless bolded sore throat, dysphagia, dental problems, itching, sneezing,  nasal congestion or excess/ purulent secretions, ear ache,   fever, chills, sweats, unintended wt loss, pleuritic or exertional cp, hemoptysis,  orthopnea pnd or leg swelling, presyncope, palpitations, heartburn, abdominal pain, anorexia, nausea, vomiting, diarrhea  or change in bowel or urinary habits, change in stools or urine, dysuria,hematuria,  rash, arthralgias, visual complaints, headache, numbness weakness or ataxia or problems with walking or coordination,  change in mood/affect or memory.          Objective:   amb stoic wm  Still Very hoarse   02/26/2015            171 > 04/23/2015  169   01/30/2015          168   07/02/14 177 lb (80.287 kg)  06/16/14 177 lb 9.6 oz (80.559 kg)  05/30/14 175 lb (79.379 kg)    Vital signs reviewed    HEENT: nl dentition, turbinates, and orophanx. Nl external ear canals  without cough reflex - edentulous    NECK :  without JVD/Nodes/TM/ nl carotid upstrokes bilaterally   LUNGS: no acc muscle use, distant bs/ min bilateral exp wheeze better with PLM    CV:  RRR  no s3 or murmur or increase in P2, no edema   ABD:  Protuberant but soft and nontender with nl excursion in  the supine position. No bruits or organomegaly, bowel sounds nl  MS:  warm without deformities, calf tenderness, cyanosis or clubbing  SKIN: warm and dry without lesions    NEURO:  alert, approp, no deficits       I personally reviewed images and agree with radiology impression as follows:  CXR:  04/13/15 Resolving left upper lobe pneumonia. Evidence of large hiatal hernia, which was present on the prior CT of the abdomen 05/31/2010.     Assessment & Plan:

## 2015-04-23 NOTE — Patient Instructions (Addendum)
Please see patient coordinator before you leave today  to schedule ENT eval Dr [name]  Try bevespi Take 2 puffs first thing in am and then another 2 puffs about 12 hours later.   Only use your albuterol as a rescue medication to be used if you can't catch your breath by resting or doing a relaxed purse lip breathing pattern.  - The less you use it, the better it will work when you need it. - Ok to use up to 2 puffs  every 4 hours if you must but call for immediate appointment if use goes up over your usual need - Don't leave home without it !!  (think of it like the spare tire for your car)   Please schedule a follow up office visit in 4 weeks, sooner if needed with cxr on return

## 2015-04-26 NOTE — Assessment & Plan Note (Signed)
-   PFTs 06/10/08  FEV1 1.73 (61%) ratio 55 with ERV 232 and DLCO 87%  - 07/02/2014 try symbicort 160 2bid and stop all powders  - 04/23/2015  extensive coaching HFA effectiveness =    75%  > try bevespi   His hoarseness and pseudowheeze are all upper airway and could be exac by use of ics - his symptoms are more typical of a Group B COPD pt so reasonable to try lama/laba = bevespi, basically the same technique as his symtbicort  I had an extended discussion with the patient reviewing all relevant studies completed to date and  lasting 15 to 20 minutes of a 25 minute visit    Each maintenance medication was reviewed in detail including most importantly the difference between maintenance and prns and under what circumstances the prns are to be triggered using an action plan format that is not reflected in the computer generated alphabetically organized AVS.    Please see instructions for details which were reviewed in writing and the patient given a copy highlighting the part that I personally wrote and discussed at today's ov.

## 2015-04-26 NOTE — Assessment & Plan Note (Signed)
Sinus CT  03/02/15 Polypoid opacification right frontal sinus. Left frontal sinus clear. Opacification anterior right ethmoid sinus air cells with remainder of ethmoid sinuses clear. Minimal mucosal thickening inferior aspect maxillary sinuses. 1.5 cm polypoid lesion within the left nasal vault >> referred back to ENT / also to address persistent hoarseness/ upper airway cough/pseudowheeze ? Mechanical obst

## 2015-05-02 ENCOUNTER — Other Ambulatory Visit: Payer: Self-pay | Admitting: Internal Medicine

## 2015-05-05 ENCOUNTER — Telehealth: Payer: Self-pay | Admitting: Internal Medicine

## 2015-05-05 ENCOUNTER — Telehealth: Payer: Self-pay | Admitting: Neurology

## 2015-05-05 NOTE — Telephone Encounter (Signed)
Noted  

## 2015-05-05 NOTE — Telephone Encounter (Signed)
PT's wife Tessie Fass called in regards to his Namzaric and said he was having dizziness and wanted to know if he can stop taking that and just take the Donepezil/Dawn CB# [phone]

## 2015-05-05 NOTE — Telephone Encounter (Signed)
Took antidepressant down from a half pill to a quarter b/c he was still having side effects.  Dr. Delice Lesch stopped namzaric today will be on 10mg  of domepezil.

## 2015-05-05 NOTE — Telephone Encounter (Signed)
Returned call to [employer]. She states that patient has had issues with dizziness since starting the Namzaric. She states at 1st he did have some bp issues and patient has been doing better with that, but dizziness still remains. Patient will stop the Namzaric and just take Aricept 10 mg daily. They still have Rx for the Aricept at home.

## 2015-05-25 ENCOUNTER — Ambulatory Visit (INDEPENDENT_AMBULATORY_CARE_PROVIDER_SITE_OTHER): Payer: Medicare HMO | Admitting: Internal Medicine

## 2015-05-25 ENCOUNTER — Ambulatory Visit (INDEPENDENT_AMBULATORY_CARE_PROVIDER_SITE_OTHER)
Admission: RE | Admit: 2015-05-25 | Discharge: 2015-05-25 | Disposition: A | Discharge status: Home / Self Care | Payer: Medicare HMO | Source: Ambulatory Visit | Attending: Internal Medicine | Admitting: Internal Medicine

## 2015-05-25 ENCOUNTER — Encounter: Payer: Self-pay | Admitting: Internal Medicine

## 2015-05-25 VITALS — BP 118/68 | HR 99 | Ht 69.0 in | Wt 171.0 lb

## 2015-05-25 DIAGNOSIS — J449 Chronic obstructive pulmonary disease, unspecified: Secondary | ICD-10-CM | POA: Diagnosis not present

## 2015-05-25 MED ORDER — GLYCOPYRROLATE-FORMOTEROL 9-4.8 MCG/ACT IN AERO: 2.0000 | INHALATION_SPRAY | Freq: Two times a day (BID) | RESPIRATORY_TRACT | Status: DC

## 2015-05-25 NOTE — Progress Notes (Signed)
Subjective:    Patient ID: Chad [name], male    DOB: 1930-01-08    MRN: [id number]  Brief patient profile:  85 yowm quit smoking x 1980 with GOLD II copd by pfts 06/10/08 and freq exac which improve transiently on steroids   History of Present Illness  07/02/2014  Acute ov/[name] re: aecopd/ ab plus ? Pseudoasthma  Chief Complaint  Patient presents with  . Acute Visit    Pt c/o wheezing and cough x 5 days. Cough is non prod. He has also started to have some nasal congestion over the past couple of days. He is using albuterol inhaler approx 3 x per day and has not had to use neb at all.   gradually worse cough/ subj wheeze since last prednisone /win a week or two of ov.  Worse day than noct/ comfortable at rest p saba but use way up over baseline rec Omeprazole Take 30- 60 min before your first and last meals of the day GERD diet  Stop powdered inhalers = spiriva / foradil and start symbicort 160 Take 2 puffs first thing in am and then another 2 puffs about 12 hours later.  Work on inhaler technique:   Only use your albuterol as a rescue medication Only use albuterol nebulizer up to every 4 hours if needed but goal is not need it all  Prednisone 10 mg take  4 each am x 2 days,   2 each am x 2 days,  1 each am x 2 days and stop     01/30/2015 acute extended ov/[name] re: recurrent cough in pt with COPD II  Chief Complaint  Patient presents with  . Acute Visit    Coughing up greenish/yellow mucus. Chest congestion, nasal congestion. Has taken 2 boxes of Mucinex and 1 bottle of liquid Mucinex already.   was better p prev ov then Onset mid November 2016 acute  assoc with stuffy nose  Has flutter not using / poor insight into prns  Cough only  better after hyrdocodone/ no better p prednisone hfa poor  rec When sick with flare of cough/ congestion  mucinex dm 1200 mg every 12 hours and use the flutter as much as possible If having bad cough/ congestion in am > use nebulizer albuterol  first thing in am  Augmentin 875 mg take one pill twice daily  X 10 days - take at breakfast and supper with large glass of water.  It would help reduce the usual side effects (diarrhea and yeast infections) if you ate cultured yogurt at lunch.  Prednisone 10 mg take  4 each am x 2 days,   2 each am x 2 days,  1 each am x 2 days and stop  Work on inhaler technique:  relax and gently blow all the way out then take a nice smooth deep breath back in, triggering the inhaler at same time you start breathing in.  Hold for up to 5 seconds if you can. Blow out thru nose. Rinse and gargle with water when done Please remember to go to the  x-ray department downstairs for your tests - we will call you with the results when they are available. Please schedule a follow up office visit in 6 weeks, call sooner if needed      02/26/2015  f/u ov/[name] re:  COPD II/ maint rx symb 160 2bid and prn saba Chief Complaint  Patient presents with  . Acute Visit    No fever as  of this AM. Denies any chills/sweats/nausea/vomiting. yesterday did cough up green phlem but non today. Had wheezing about 1 day ago.  was fine from 1/6 - 1/24 one episode early am sob > 911 > nl vitals, better s rx  Then 02/25/15 coughed up green for the first time    Changed prilosec to alternative rx 2/1 Rare perceived need for saba / not clear he used action plan  rec Augmentin 875 mg take one pill twice daily  X 10 days  Prilosec 20 mg Take 30- 60 min before your first and last meals of the day  For cough > mucinex dm 1200 up to twice daily and use the flutter as you can  Work on Product manager inhaler technique:        Admit date: 03/11/2015 Discharge date: 03/15/2015     Discharge Diagnoses:  Principal Problem:  Sepsis due to pneumonia [facility]) Active Problems:  HLD (hyperlipidemia)  Essential hypertension  COPD GOLD II   Diabetes mellitus without complication (HCC)  Moderate dementia with behavioral disturbance  Pneumonia   Sepsis ([place])  Acute kidney injury (HCC)> resolved    04/23/2015  f/u ov/[name] re: GOLD II/ maint on symbicort 160 2bid  Chief Complaint  Patient presents with  . Follow-up    Breathing is doing well. No new co's today.   Not limited by breathing from desired activities  But very inactive   rec Please see patient coordinator before you leave today  to schedule ENT eval Dr [name] Try bevespi Take 2 puffs first thing in am and then another 2 puffs about 12 hours later.  Only use your albuterol as a rescue medication     05/25/2015  f/u ov/[name] re: COPD  GOLD II/ maint rx bivespi   Chief Complaint  Patient presents with  . Follow-up    Breathing is overall doing well. Had some wheezing and cough 1 day ago-mucinex has helped.   rarely using any saba hfa/ no neb at all  Doe = MMRC1 = can walk nl pace, flat grade, can't hurry or go uphills or steps s sob     No obvious day to day or daytime variabilty or assoc increase sob or perceived need for saba or cp or chest tightness,   overt   hb symptoms. No unusual exp hx or h/o childhood pna/ asthma or knowledge of premature birth.  Sleeping ok without nocturnal  or early am exacerbation  of respiratory  c/o's or need for noct saba. Also denies any obvious fluctuation of symptoms with weather or environmental changes or other aggravating or alleviating factors except as outlined above   Current Medications, Allergies, Complete Past Medical History, Past Surgical History, Family History, and Social History were reviewed in [employer] record.  ROS  The following are not active complaints unless bolded sore throat, dysphagia, dental problems, itching, sneezing,  nasal congestion or excess/ purulent secretions, ear ache,   fever, chills, sweats, unintended wt loss, pleuritic or exertional cp, hemoptysis,  orthopnea pnd or leg swelling, presyncope, palpitations, heartburn, abdominal pain, anorexia, nausea, vomiting, diarrhea  or  change in bowel or urinary habits, change in stools or urine, dysuria,hematuria,  rash, arthralgias, visual complaints, headache, numbness weakness or ataxia or problems with walking or coordination,  change in mood/affect or memory.          Objective:   amb stoic wm  Less  hoarse   02/26/2015  171 > 04/23/2015  169 > 05/25/2015   171   01/30/2015          168   07/02/14 177 lb (80.287 kg)  06/16/14 177 lb 9.6 oz (80.559 kg)  05/30/14 175 lb (79.379 kg)    Vital signs reviewed    HEENT: nl dentition, turbinates, and orophanx. Nl external ear canals without cough reflex - edentulous    NECK :  without JVD/Nodes/TM/ nl carotid upstrokes bilaterally   LUNGS: no acc muscle use, minimal insp and exp rhonchi    CV:  RRR  no s3 or murmur or increase in P2, no edema   ABD:  Protuberant but soft and nontender with nl excursion in the supine position. No bruits or organomegaly, bowel sounds nl  MS:  warm without deformities, calf tenderness, cyanosis or clubbing  SKIN: warm and dry without lesions    NEURO:  alert, approp, no deficits      CXR PA and Lateral:   05/25/2015 :    I personally reviewed images and agree with radiology impression as follows:   1. No acute abnormality. 2. Stable large hiatal hernia. 3. Mild changes of COPD and chronic bronchitis.       Assessment & Plan:

## 2015-05-25 NOTE — Assessment & Plan Note (Addendum)
-   PFTs 06/10/08  FEV1 1.73 (61%) ratio 55 with ERV 232 and DLCO 87%  - 07/02/2014 try symbicort 160 2bid and stop all powders  - 04/23/2015  > try bevespi less hoarse,some better doe  - 05/25/2015  After extensive coaching HFA effectiveness =    75%    Moderate dz/ well controlled symptoms off systemic and topical steroids will less upper airway irritation despite suboptimal hfa/ reviewed  I had an extended discussion with the patient reviewing all relevant studies completed to date and  lasting 15 to 20 minutes of a 25 minute visit    Challenge will be maintaining med reconciliation [facility] through [facility]  Formulary restrictions will be an ongoing challenge for the forseable future and I would be happy to pick an alternative if the pt will first  provide me a list of them but pt  will need to return here for training for any new device that is required eg dpi vs hfa vs respimat.    In meantime we can always provide samples so the patient never runs out of any needed respiratory medications.   Each maintenance medication was reviewed in detail including most importantly the difference between maintenance and prns and under what circumstances the prns are to be triggered using an action plan format that is not reflected in the computer generated alphabetically organized AVS.    Please see instructions for details which were reviewed in writing and the patient given a copy highlighting the part that I personally wrote and discussed at today's ov.

## 2015-05-25 NOTE — Patient Instructions (Signed)
Continue bevespi Take 2 puffs first thing in am and then another 2 puffs about 12 hours later if the [place] doesn't cover it then ok to change back to symbiocrt   Please schedule a follow up office visit in 6 weeks, call sooner if needed

## 2015-05-25 NOTE — Progress Notes (Signed)
Quick Note:  Spoke with pt and notified of results per Dr. Wert. Pt verbalized understanding and denied any questions.  ______ 

## 2015-06-01 ENCOUNTER — Other Ambulatory Visit: Payer: Self-pay

## 2015-06-01 ENCOUNTER — Emergency Department (HOSPITAL_COMMUNITY): Payer: Medicare HMO

## 2015-06-01 ENCOUNTER — Encounter (HOSPITAL_COMMUNITY): Payer: Self-pay

## 2015-06-01 ENCOUNTER — Encounter: Payer: Self-pay | Admitting: Adult Health

## 2015-06-01 ENCOUNTER — Inpatient Hospital Stay (HOSPITAL_COMMUNITY)
Admission: EM | Admit: 2015-06-01 | Discharge: 2015-06-09 | DRG: 871 | Disposition: A | Discharge status: Skilled Nursing Facility | Payer: Medicare HMO | Attending: Internal Medicine | Admitting: Internal Medicine

## 2015-06-01 ENCOUNTER — Other Ambulatory Visit: Payer: Self-pay | Admitting: Internal Medicine

## 2015-06-01 ENCOUNTER — Ambulatory Visit (INDEPENDENT_AMBULATORY_CARE_PROVIDER_SITE_OTHER): Payer: Medicare HMO | Admitting: Adult Health

## 2015-06-01 VITALS — BP 150/66 | HR 78 | Ht 69.0 in | Wt 171.0 lb

## 2015-06-01 DIAGNOSIS — R1310 Dysphagia, unspecified: Secondary | ICD-10-CM | POA: Diagnosis present

## 2015-06-01 DIAGNOSIS — E7800 Pure hypercholesterolemia, unspecified: Secondary | ICD-10-CM | POA: Diagnosis present

## 2015-06-01 DIAGNOSIS — Z7982 Long term (current) use of aspirin: Secondary | ICD-10-CM

## 2015-06-01 DIAGNOSIS — K219 Gastro-esophageal reflux disease without esophagitis: Secondary | ICD-10-CM | POA: Diagnosis present

## 2015-06-01 DIAGNOSIS — J9621 Acute and chronic respiratory failure with hypoxia: Secondary | ICD-10-CM | POA: Diagnosis present

## 2015-06-01 DIAGNOSIS — I248 Other forms of acute ischemic heart disease: Secondary | ICD-10-CM | POA: Diagnosis present

## 2015-06-01 DIAGNOSIS — J189 Pneumonia, unspecified organism: Secondary | ICD-10-CM | POA: Diagnosis present

## 2015-06-01 DIAGNOSIS — Z8673 Personal history of transient ischemic attack (TIA), and cerebral infarction without residual deficits: Secondary | ICD-10-CM | POA: Diagnosis not present

## 2015-06-01 DIAGNOSIS — I6523 Occlusion and stenosis of bilateral carotid arteries: Secondary | ICD-10-CM | POA: Diagnosis present

## 2015-06-01 DIAGNOSIS — E872 Acidosis, unspecified: Secondary | ICD-10-CM | POA: Insufficient documentation

## 2015-06-01 DIAGNOSIS — Z85828 Personal history of other malignant neoplasm of skin: Secondary | ICD-10-CM

## 2015-06-01 DIAGNOSIS — I63413 Cerebral infarction due to embolism of bilateral middle cerebral arteries: Secondary | ICD-10-CM | POA: Insufficient documentation

## 2015-06-01 DIAGNOSIS — J9622 Acute and chronic respiratory failure with hypercapnia: Secondary | ICD-10-CM | POA: Diagnosis present

## 2015-06-01 DIAGNOSIS — Y95 Nosocomial condition: Secondary | ICD-10-CM | POA: Diagnosis present

## 2015-06-01 DIAGNOSIS — R6520 Severe sepsis without septic shock: Secondary | ICD-10-CM | POA: Diagnosis present

## 2015-06-01 DIAGNOSIS — I6789 Other cerebrovascular disease: Secondary | ICD-10-CM | POA: Diagnosis not present

## 2015-06-01 DIAGNOSIS — G9340 Encephalopathy, unspecified: Secondary | ICD-10-CM | POA: Diagnosis present

## 2015-06-01 DIAGNOSIS — J9602 Acute respiratory failure with hypercapnia: Secondary | ICD-10-CM | POA: Diagnosis not present

## 2015-06-01 DIAGNOSIS — F0391 Unspecified dementia with behavioral disturbance: Secondary | ICD-10-CM | POA: Diagnosis present

## 2015-06-01 DIAGNOSIS — I6330 Cerebral infarction due to thrombosis of unspecified cerebral artery: Secondary | ICD-10-CM | POA: Insufficient documentation

## 2015-06-01 DIAGNOSIS — R05 Cough: Secondary | ICD-10-CM

## 2015-06-01 DIAGNOSIS — Z79899 Other long term (current) drug therapy: Secondary | ICD-10-CM

## 2015-06-01 DIAGNOSIS — J449 Chronic obstructive pulmonary disease, unspecified: Secondary | ICD-10-CM | POA: Diagnosis not present

## 2015-06-01 DIAGNOSIS — E119 Type 2 diabetes mellitus without complications: Secondary | ICD-10-CM | POA: Diagnosis present

## 2015-06-01 DIAGNOSIS — Z888 Allergy status to other drugs, medicaments and biological substances status: Secondary | ICD-10-CM | POA: Diagnosis not present

## 2015-06-01 DIAGNOSIS — J441 Chronic obstructive pulmonary disease with (acute) exacerbation: Secondary | ICD-10-CM | POA: Diagnosis not present

## 2015-06-01 DIAGNOSIS — Z7951 Long term (current) use of inhaled steroids: Secondary | ICD-10-CM

## 2015-06-01 DIAGNOSIS — I10 Essential (primary) hypertension: Secondary | ICD-10-CM | POA: Diagnosis present

## 2015-06-01 DIAGNOSIS — Z7952 Long term (current) use of systemic steroids: Secondary | ICD-10-CM | POA: Diagnosis not present

## 2015-06-01 DIAGNOSIS — E785 Hyperlipidemia, unspecified: Secondary | ICD-10-CM | POA: Diagnosis present

## 2015-06-01 DIAGNOSIS — J9601 Acute respiratory failure with hypoxia: Secondary | ICD-10-CM | POA: Diagnosis not present

## 2015-06-01 DIAGNOSIS — A419 Sepsis, unspecified organism: Principal | ICD-10-CM | POA: Diagnosis present

## 2015-06-01 DIAGNOSIS — J9600 Acute respiratory failure, unspecified whether with hypoxia or hypercapnia: Secondary | ICD-10-CM

## 2015-06-01 DIAGNOSIS — G9341 Metabolic encephalopathy: Secondary | ICD-10-CM | POA: Diagnosis present

## 2015-06-01 DIAGNOSIS — R680 Hypothermia, not associated with low environmental temperature: Secondary | ICD-10-CM | POA: Diagnosis present

## 2015-06-01 DIAGNOSIS — J440 Chronic obstructive pulmonary disease with acute lower respiratory infection: Secondary | ICD-10-CM | POA: Diagnosis present

## 2015-06-01 DIAGNOSIS — N179 Acute kidney failure, unspecified: Secondary | ICD-10-CM | POA: Insufficient documentation

## 2015-06-01 DIAGNOSIS — R059 Cough, unspecified: Secondary | ICD-10-CM

## 2015-06-01 DIAGNOSIS — G8194 Hemiplegia, unspecified affecting left nondominant side: Secondary | ICD-10-CM | POA: Diagnosis not present

## 2015-06-01 DIAGNOSIS — O24439 Gestational diabetes mellitus in the puerperium, unspecified control: Secondary | ICD-10-CM

## 2015-06-01 DIAGNOSIS — R2990 Unspecified symptoms and signs involving the nervous system: Secondary | ICD-10-CM

## 2015-06-01 DIAGNOSIS — I639 Cerebral infarction, unspecified: Secondary | ICD-10-CM | POA: Diagnosis present

## 2015-06-01 DIAGNOSIS — H409 Unspecified glaucoma: Secondary | ICD-10-CM | POA: Diagnosis present

## 2015-06-01 DIAGNOSIS — Z881 Allergy status to other antibiotic agents status: Secondary | ICD-10-CM

## 2015-06-01 DIAGNOSIS — I6521 Occlusion and stenosis of right carotid artery: Secondary | ICD-10-CM | POA: Diagnosis not present

## 2015-06-01 HISTORY — DX: Gastro-esophageal reflux disease without esophagitis: K21.9

## 2015-06-01 HISTORY — DX: Cerebral infarction, unspecified: I63.9

## 2015-06-01 HISTORY — DX: Diverticulitis of intestine, part unspecified, without perforation or abscess without bleeding: K57.92

## 2015-06-01 HISTORY — DX: Type 2 diabetes mellitus without complications: E11.9

## 2015-06-01 HISTORY — DX: Pure hypercholesterolemia, unspecified: E78.00

## 2015-06-01 HISTORY — DX: Essential (primary) hypertension: I10

## 2015-06-01 HISTORY — DX: Disorder of kidney and ureter, unspecified: N28.9

## 2015-06-01 HISTORY — DX: Unspecified glaucoma: H40.9

## 2015-06-01 LAB — CBC WITH DIFFERENTIAL/PLATELET
Basophils Absolute: 0 10*3/uL (ref 0.0–0.1)
Basophils Relative: 0 %
EOS PCT: 6 %
Eosinophils Absolute: 1.1 10*3/uL — ABNORMAL HIGH (ref 0.0–0.7)
HEMATOCRIT: 42.3 % (ref 39.0–52.0)
HEMOGLOBIN: 12.9 g/dL — AB (ref 13.0–17.0)
Lymphocytes Relative: 53 %
Lymphs Abs: 10.1 10*3/uL — ABNORMAL HIGH (ref 0.7–4.0)
MCH: 27.2 pg (ref 26.0–34.0)
MCHC: 30.5 g/dL (ref 30.0–36.0)
MCV: 89.2 fL (ref 78.0–100.0)
MONOS PCT: 9 %
Monocytes Absolute: 1.7 10*3/uL — ABNORMAL HIGH (ref 0.1–1.0)
NEUTROS PCT: 32 %
Neutro Abs: 6 10*3/uL (ref 1.7–7.7)
Platelets: 298 10*3/uL (ref 150–400)
RBC: 4.74 MIL/uL (ref 4.22–5.81)
RDW: 15.8 % — ABNORMAL HIGH (ref 11.5–15.5)
WBC: 18.9 10*3/uL — AB (ref 4.0–10.5)

## 2015-06-01 LAB — URINALYSIS, ROUTINE W REFLEX MICROSCOPIC
BILIRUBIN URINE: NEGATIVE
Glucose, UA: 500 mg/dL — AB
Ketones, ur: NEGATIVE mg/dL
Leukocytes, UA: NEGATIVE
NITRITE: NEGATIVE
PH: 6 (ref 5.0–8.0)
Protein, ur: 100 mg/dL — AB
SPECIFIC GRAVITY, URINE: 1.018 (ref 1.005–1.030)

## 2015-06-01 LAB — POCT I-STAT 3, ART BLOOD GAS (G3+)
ACID-BASE DEFICIT: 10 mmol/L — AB (ref 0.0–2.0)
BICARBONATE: 16.1 meq/L — AB (ref 20.0–24.0)
O2 Saturation: 94 %
PH ART: 7.298 — AB (ref 7.350–7.450)
PO2 ART: 72 mmHg — AB (ref 80.0–100.0)
Patient temperature: 35.7
TCO2: 17 mmol/L (ref 0–100)
pCO2 arterial: 32.4 mmHg — ABNORMAL LOW (ref 35.0–45.0)

## 2015-06-01 LAB — COMPREHENSIVE METABOLIC PANEL
ALK PHOS: 88 U/L (ref 38–126)
ALT: 22 U/L (ref 17–63)
AST: 33 U/L (ref 15–41)
Albumin: 4.1 g/dL (ref 3.5–5.0)
Anion gap: 17 — ABNORMAL HIGH (ref 5–15)
BILIRUBIN TOTAL: 0.4 mg/dL (ref 0.3–1.2)
BUN: 22 mg/dL — AB (ref 6–20)
CHLORIDE: 101 mmol/L (ref 101–111)
CO2: 17 mmol/L — ABNORMAL LOW (ref 22–32)
CREATININE: 1.7 mg/dL — AB (ref 0.61–1.24)
Calcium: 10.1 mg/dL (ref 8.9–10.3)
GFR calc Af Amer: 41 mL/min — ABNORMAL LOW (ref >60)
GFR, EST NON AFRICAN AMERICAN: 35 mL/min — AB (ref >60)
Glucose, Bld: 306 mg/dL — ABNORMAL HIGH (ref 65–99)
Potassium: 5 mmol/L (ref 3.5–5.1)
Sodium: 135 mmol/L (ref 135–145)
TOTAL PROTEIN: 7.3 g/dL (ref 6.5–8.1)

## 2015-06-01 LAB — I-STAT ARTERIAL BLOOD GAS, ED
Acid-base deficit: 2 mmol/L (ref 0.0–2.0)
BICARBONATE: 26.3 meq/L — AB (ref 20.0–24.0)
O2 Saturation: 100 %
PH ART: 7.273 — AB (ref 7.350–7.450)
TCO2: 28 mmol/L (ref 0–100)
pCO2 arterial: 55.4 mmHg — ABNORMAL HIGH (ref 35.0–45.0)
pO2, Arterial: 456 mmHg — ABNORMAL HIGH (ref 80.0–100.0)

## 2015-06-01 LAB — I-STAT CHEM 8, ED
BUN: 29 mg/dL — AB (ref 6–20)
CALCIUM ION: 1.34 mmol/L — AB (ref 1.13–1.30)
CHLORIDE: 106 mmol/L (ref 101–111)
Creatinine, Ser: 1.4 mg/dL — ABNORMAL HIGH (ref 0.61–1.24)
Glucose, Bld: 292 mg/dL — ABNORMAL HIGH (ref 65–99)
HCT: 44 % (ref 39.0–52.0)
Hemoglobin: 15 g/dL (ref 13.0–17.0)
Potassium: 5.1 mmol/L (ref 3.5–5.1)
SODIUM: 137 mmol/L (ref 135–145)
TCO2: 21 mmol/L (ref 0–100)

## 2015-06-01 LAB — GLUCOSE, CAPILLARY: Glucose-Capillary: 297 mg/dL — ABNORMAL HIGH (ref 65–99)

## 2015-06-01 LAB — MAGNESIUM: MAGNESIUM: 3.7 mg/dL — AB (ref 1.7–2.4)

## 2015-06-01 LAB — PROTIME-INR
INR: 1.21 (ref 0.00–1.49)
Prothrombin Time: 15.4 seconds — ABNORMAL HIGH (ref 11.6–15.2)

## 2015-06-01 LAB — URINE MICROSCOPIC-ADD ON

## 2015-06-01 LAB — BRAIN NATRIURETIC PEPTIDE: B Natriuretic Peptide: 174.9 pg/mL — ABNORMAL HIGH (ref 0.0–100.0)

## 2015-06-01 LAB — PROCALCITONIN: Procalcitonin: <0.1 ng/mL

## 2015-06-01 LAB — PHOSPHORUS: PHOSPHORUS: 9 mg/dL — AB (ref 2.5–4.6)

## 2015-06-01 LAB — LACTIC ACID, PLASMA: LACTIC ACID, VENOUS: 5.7 mmol/L — AB (ref 0.5–2.0)

## 2015-06-01 LAB — I-STAT CG4 LACTIC ACID, ED: Lactic Acid, Venous: 8.88 mmol/L (ref 0.5–2.0)

## 2015-06-01 LAB — TROPONIN I: TROPONIN I: 0.16 ng/mL — AB (ref <0.031)

## 2015-06-01 MED ORDER — SODIUM CHLORIDE 0.9 % IV SOLN
INTRAVENOUS | Status: DC
  Administered 2015-06-01 – 2015-06-07 (×3): via INTRAVENOUS

## 2015-06-01 MED ORDER — HEPARIN SODIUM (PORCINE) 5000 UNIT/ML IJ SOLN
5000.0000 [IU] | Freq: Three times a day (TID) | INTRAMUSCULAR | Status: DC
  Administered 2015-06-01 – 2015-06-09 (×23): 5000 [IU] via SUBCUTANEOUS
  Filled 2015-06-01 (×24): qty 1

## 2015-06-01 MED ORDER — SODIUM CHLORIDE 0.9 % IV BOLUS (SEPSIS)
1600.0000 mL | Freq: Once | INTRAVENOUS | Status: AC
  Administered 2015-06-01: 1600 mL via INTRAVENOUS

## 2015-06-01 MED ORDER — ASPIRIN 81 MG PO CHEW
81.0000 mg | CHEWABLE_TABLET | Freq: Every day | ORAL | Status: DC
  Administered 2015-06-02: 81 mg
  Filled 2015-06-01: qty 1

## 2015-06-01 MED ORDER — MIDAZOLAM HCL 2 MG/2ML IJ SOLN: 2.0000 mg | INTRAMUSCULAR | Status: DC | PRN

## 2015-06-01 MED ORDER — MIDAZOLAM HCL 2 MG/2ML IJ SOLN: 1.0000 mg | INTRAMUSCULAR | Status: DC | PRN

## 2015-06-01 MED ORDER — MIDAZOLAM HCL 2 MG/2ML IJ SOLN
1.0000 mg | INTRAMUSCULAR | Status: DC | PRN
  Administered 2015-06-02 (×3): 1 mg via INTRAVENOUS
  Filled 2015-06-01 (×3): qty 2

## 2015-06-01 MED ORDER — MORPHINE SULFATE (PF) 4 MG/ML IV SOLN
4.0000 mg | Freq: Once | INTRAVENOUS | Status: AC
  Administered 2015-06-01: 4 mg via INTRAVENOUS

## 2015-06-01 MED ORDER — AMOXICILLIN-POT CLAVULANATE 875-125 MG PO TABS: 1.0000 | ORAL_TABLET | Freq: Two times a day (BID) | ORAL | Status: AC

## 2015-06-01 MED ORDER — SODIUM CHLORIDE 0.9 % IV BOLUS (SEPSIS)
1000.0000 mL | Freq: Once | INTRAVENOUS | Status: AC
  Administered 2015-06-01: 1000 mL via INTRAVENOUS

## 2015-06-01 MED ORDER — INSULIN ASPART 100 UNIT/ML ~~LOC~~ SOLN
2.0000 [IU] | SUBCUTANEOUS | Status: DC
  Administered 2015-06-02: 4 [IU] via SUBCUTANEOUS
  Administered 2015-06-02 (×2): 6 [IU] via SUBCUTANEOUS
  Administered 2015-06-02: 4 [IU] via SUBCUTANEOUS
  Administered 2015-06-02: 6 [IU] via SUBCUTANEOUS
  Administered 2015-06-03: 2 [IU] via SUBCUTANEOUS
  Administered 2015-06-03: 6 [IU] via SUBCUTANEOUS
  Administered 2015-06-03: 4 [IU] via SUBCUTANEOUS
  Administered 2015-06-03 (×2): 6 [IU] via SUBCUTANEOUS
  Administered 2015-06-03: 4 [IU] via SUBCUTANEOUS
  Administered 2015-06-04: 2 [IU] via SUBCUTANEOUS
  Administered 2015-06-04: 4 [IU] via SUBCUTANEOUS
  Administered 2015-06-04: 6 [IU] via SUBCUTANEOUS

## 2015-06-01 MED ORDER — FENTANYL CITRATE (PF) 100 MCG/2ML IJ SOLN: 50.0000 ug | INTRAMUSCULAR | Status: DC | PRN

## 2015-06-01 MED ORDER — LORAZEPAM 2 MG/ML IJ SOLN
4.0000 mg | Freq: Once | INTRAMUSCULAR | Status: AC
  Administered 2015-06-01: 2 mg via INTRAVENOUS

## 2015-06-01 MED ORDER — SODIUM BICARBONATE 8.4 % IV SOLN
50.0000 meq | Freq: Once | INTRAVENOUS | Status: AC
  Administered 2015-06-01: 50 meq via INTRAVENOUS
  Filled 2015-06-01: qty 50

## 2015-06-01 MED ORDER — DEXTROSE 5 % IV SOLN
1.0000 g | Freq: Once | INTRAVENOUS | Status: DC
  Administered 2015-06-01: 1 g via INTRAVENOUS
  Filled 2015-06-01: qty 10

## 2015-06-01 MED ORDER — KETAMINE HCL-SODIUM CHLORIDE 100-0.9 MG/10ML-% IV SOSY
100.0000 mg | PREFILLED_SYRINGE | Freq: Once | INTRAVENOUS | Status: AC
  Administered 2015-06-01: 100 mg via INTRAVENOUS

## 2015-06-01 MED ORDER — VANCOMYCIN HCL IN DEXTROSE 750-5 MG/150ML-% IV SOLN
750.0000 mg | Freq: Two times a day (BID) | INTRAVENOUS | Status: DC
  Administered 2015-06-02: 750 mg via INTRAVENOUS
  Filled 2015-06-01: qty 150

## 2015-06-01 MED ORDER — PREDNISONE 10 MG PO TABS: ORAL_TABLET | ORAL | Status: DC

## 2015-06-01 MED ORDER — LEVALBUTEROL HCL 0.63 MG/3ML IN NEBU: 0.6300 mg | INHALATION_SOLUTION | Freq: Once | RESPIRATORY_TRACT | Status: DC

## 2015-06-01 MED ORDER — ALBUTEROL SULFATE (2.5 MG/3ML) 0.083% IN NEBU
2.5000 mg | INHALATION_SOLUTION | RESPIRATORY_TRACT | Status: DC | PRN
  Administered 2015-06-03 – 2015-06-06 (×2): 2.5 mg via RESPIRATORY_TRACT
  Filled 2015-06-01 (×2): qty 3

## 2015-06-01 MED ORDER — SUCCINYLCHOLINE CHLORIDE 20 MG/ML IJ SOLN
120.0000 mg | Freq: Once | INTRAMUSCULAR | Status: AC
  Administered 2015-06-01: 120 mg via INTRAVENOUS

## 2015-06-01 MED ORDER — SODIUM CHLORIDE 0.9 % IV SOLN: 250.0000 mL | INTRAVENOUS | Status: DC | PRN

## 2015-06-01 MED ORDER — VANCOMYCIN HCL IN DEXTROSE 1-5 GM/200ML-% IV SOLN
1000.0000 mg | Freq: Once | INTRAVENOUS | Status: AC
  Administered 2015-06-01: 1000 mg via INTRAVENOUS
  Filled 2015-06-01: qty 200

## 2015-06-01 MED ORDER — PANTOPRAZOLE SODIUM 40 MG IV SOLR
40.0000 mg | Freq: Every day | INTRAVENOUS | Status: DC
  Administered 2015-06-01 – 2015-06-02 (×2): 40 mg via INTRAVENOUS
  Filled 2015-06-01 (×2): qty 40

## 2015-06-01 MED ORDER — DONEPEZIL HCL 10 MG PO TABS
10.0000 mg | ORAL_TABLET | Freq: Every day | ORAL | Status: DC
  Administered 2015-06-01 – 2015-06-02 (×2): 10 mg
  Filled 2015-06-01 (×3): qty 1

## 2015-06-01 MED ORDER — PIPERACILLIN-TAZOBACTAM 3.375 G IVPB 30 MIN
3.3750 g | Freq: Once | INTRAVENOUS | Status: AC
  Administered 2015-06-01: 3.375 g via INTRAVENOUS
  Filled 2015-06-01: qty 50

## 2015-06-01 MED ORDER — METHYLPREDNISOLONE SODIUM SUCC 125 MG IJ SOLR
60.0000 mg | Freq: Three times a day (TID) | INTRAMUSCULAR | Status: DC
  Administered 2015-06-01 – 2015-06-02 (×2): 60 mg via INTRAVENOUS
  Filled 2015-06-01: qty 2
  Filled 2015-06-01: qty 0.96
  Filled 2015-06-01: qty 2

## 2015-06-01 MED ORDER — PIPERACILLIN-TAZOBACTAM 3.375 G IVPB
3.3750 g | Freq: Three times a day (TID) | INTRAVENOUS | Status: DC
  Administered 2015-06-02 – 2015-06-09 (×22): 3.375 g via INTRAVENOUS
  Filled 2015-06-01 (×26): qty 50

## 2015-06-01 MED ORDER — DEXTROSE 5 % IV SOLN
500.0000 mg | Freq: Once | INTRAVENOUS | Status: DC
  Administered 2015-06-01: 500 mg via INTRAVENOUS
  Filled 2015-06-01: qty 500

## 2015-06-01 MED ORDER — IPRATROPIUM-ALBUTEROL 0.5-2.5 (3) MG/3ML IN SOLN
3.0000 mL | Freq: Four times a day (QID) | RESPIRATORY_TRACT | Status: DC
  Administered 2015-06-01 – 2015-06-03 (×9): 3 mL via RESPIRATORY_TRACT
  Filled 2015-06-01 (×9): qty 3

## 2015-06-01 MED ORDER — FENTANYL CITRATE (PF) 100 MCG/2ML IJ SOLN
50.0000 ug | INTRAMUSCULAR | Status: DC | PRN
  Administered 2015-06-02 (×5): 50 ug via INTRAVENOUS
  Filled 2015-06-01 (×5): qty 2

## 2015-06-01 NOTE — ED Notes (Signed)
Medications administered by Zandra Abts

## 2015-06-01 NOTE — Progress Notes (Signed)
Subjective:    Patient ID: Chad [name], male    DOB: 07-09-1929    MRN: [id number]  Brief patient profile:  85 yowm quit smoking x 1980 with GOLD II copd by pfts 06/10/08 and freq exac which improve transiently on steroids   History of Present Illness  07/02/2014  Acute ov/Wert re: aecopd/ ab plus ? Pseudoasthma  Chief Complaint  Patient presents with  . Acute Visit    Pt c/o wheezing and cough x 5 days. Cough is non prod. He has also started to have some nasal congestion over the past couple of days. He is using albuterol inhaler approx 3 x per day and has not had to use neb at all.   gradually worse cough/ subj wheeze since last prednisone /win a week or two of ov.  Worse day than noct/ comfortable at rest p saba but use way up over baseline rec Omeprazole Take 30- 60 min before your first and last meals of the day GERD diet  Stop powdered inhalers = spiriva / foradil and start symbicort 160 Take 2 puffs first thing in am and then another 2 puffs about 12 hours later.  Work on inhaler technique:   Only use your albuterol as a rescue medication Only use albuterol nebulizer up to every 4 hours if needed but goal is not need it all  Prednisone 10 mg take  4 each am x 2 days,   2 each am x 2 days,  1 each am x 2 days and stop     01/30/2015 acute extended ov/Wert re: recurrent cough in pt with COPD II  Chief Complaint  Patient presents with  . Acute Visit    Coughing up greenish/yellow mucus. Chest congestion, nasal congestion. Has taken 2 boxes of Mucinex and 1 bottle of liquid Mucinex already.   was better p prev ov then Onset mid November 2016 acute  assoc with stuffy nose  Has flutter not using / poor insight into prns  Cough only  better after hyrdocodone/ no better p prednisone hfa poor  rec When sick with flare of cough/ congestion  mucinex dm 1200 mg every 12 hours and use the flutter as much as possible If having bad cough/ congestion in am > use nebulizer albuterol  first thing in am  Augmentin 875 mg take one pill twice daily  X 10 days - take at breakfast and supper with large glass of water.  It would help reduce the usual side effects (diarrhea and yeast infections) if you ate cultured yogurt at lunch.  Prednisone 10 mg take  4 each am x 2 days,   2 each am x 2 days,  1 each am x 2 days and stop  Work on inhaler technique:  relax and gently blow all the way out then take a nice smooth deep breath back in, triggering the inhaler at same time you start breathing in.  Hold for up to 5 seconds if you can. Blow out thru nose. Rinse and gargle with water when done Please remember to go to the  x-ray department downstairs for your tests - we will call you with the results when they are available. Please schedule a follow up office visit in 6 weeks, call sooner if needed      02/26/2015  f/u ov/Wert re:  COPD II/ maint rx symb 160 2bid and prn saba Chief Complaint  Patient presents with  . Acute Visit    No fever as  of this AM. Denies any chills/sweats/nausea/vomiting. yesterday did cough up green phlem but non today. Had wheezing about 1 day ago.  was fine from 1/6 - 1/24 one episode early am sob > 911 > nl vitals, better s rx  Then 02/25/15 coughed up green for the first time    Changed prilosec to alternative rx 2/1 Rare perceived need for saba / not clear he used action plan  rec Augmentin 875 mg take one pill twice daily  X 10 days  Prilosec 20 mg Take 30- 60 min before your first and last meals of the day  For cough > mucinex dm 1200 up to twice daily and use the flutter as you can  Work on Product manager inhaler technique:        Admit date: 03/11/2015 Discharge date: 03/15/2015     Discharge Diagnoses:  Principal Problem:  Sepsis due to pneumonia [facility]) Active Problems:  HLD (hyperlipidemia)  Essential hypertension  COPD GOLD II   Diabetes mellitus without complication (HCC)  Moderate dementia with behavioral disturbance  Pneumonia   Sepsis ([place])  Acute kidney injury (HCC)> resolved    04/23/2015  f/u ov/Wert re: GOLD II/ maint on symbicort 160 2bid  Chief Complaint  Patient presents with  . Follow-up    Breathing is doing well. No new co's today.   Not limited by breathing from desired activities  But very inactive   rec Please see patient coordinator before you leave today  to schedule ENT eval Dr [name] Try bevespi Take 2 puffs first thing in am and then another 2 puffs about 12 hours later.  Only use your albuterol as a rescue medication     05/25/2015  f/u ov/Wert re: COPD  GOLD II/ maint rx bivespi   Chief Complaint  Patient presents with  . Follow-up    Breathing is overall doing well. Had some wheezing and cough 1 day ago-mucinex has helped.   rarely using any saba hfa/ no neb at all  Doe = MMRC1 = can walk nl pace, flat grade, can't hurry or go uphills or steps s sob    >>no changes   06/01/2015 Acute OV  Pt presents for an acute office visit.  Complains of wheezing, SOB, prod cough with clear mucus, chest tightness/congestion starting on 05/29/15. Denies any sinus congestion/drainage, fever, nausea or vomiting. Called EMS last pm, was given Neb tx. Felt better and declined hospital transport.  Feels some better this am but coughing up thick mucus, and having wheezing .  CXR 5/1 w/ COPD changes , nad.   Current Medications, Allergies, Complete Past Medical History, Past Surgical History, Family History, and Social History were reviewed in [employer] record.  ROS  The following are not active complaints unless bolded sore throat, dysphagia, dental problems, itching, sneezing,  nasal congestion or excess/ purulent secretions, ear ache,   fever, chills, sweats, unintended wt loss, pleuritic or exertional cp, hemoptysis,  orthopnea pnd or leg swelling, presyncope, palpitations, heartburn, abdominal pain, anorexia, nausea, vomiting, diarrhea  or change in bowel or urinary habits, change  in stools or urine, dysuria,hematuria,  rash, arthralgias, visual complaints, headache, numbness weakness or ataxia or problems with walking or coordination,  change in mood/affect or memory.          Objective:   amb stoic wm     02/26/2015            171 > 04/23/2015  169 > 05/25/2015   171 Filed Vitals:  06/01/15 1430  BP: 150/66  Pulse: 78  Height: 5\' 9"  (1.753 m)  Weight: 171 lb (77.565 kg)  SpO2: 96%      Vital signs reviewed    HEENT: nl dentition, turbinates, and orophanx. Nl external ear canals without cough reflex - edentulous    NECK :  without JVD/Nodes/TM/ nl carotid upstrokes bilaterally   LUNGS: no acc muscle use, minimal insp and exp rhonchi    CV:  RRR  no s3 or murmur or increase in P2, no edema   ABD:  Protuberant but soft and nontender with nl excursion in the supine position. No bruits or organomegaly, bowel sounds nl  MS:  warm without deformities, calf tenderness, cyanosis or clubbing  SKIN: warm and dry without lesions    NEURO:  alert, approp, no deficits      CXR PA and Lateral:   05/25/2015 :     1. No acute abnormality. 2. Stable large hiatal hernia. 3. Mild changes of COPD and chronic bronchitis.   Tammy Parrett NP-C  [facility]    06/01/2015     Assessment & Plan:

## 2015-06-01 NOTE — Progress Notes (Signed)
Pharmacy Antibiotic Note  Chad [name] is a 82 y.o. male admitted on 06/01/2015 with pneumonia.  Pharmacy has been consulted for vancomycin and zosyn dosing.  Pt initially was started on ceftriaxone and azithromycin for CAP but broadened with recent antibiotic use. Pt received vancomycin 1g and zosyn 3.375g IV once in the ED.  Plan: Vancomycin 750mg  IV every 12 hours.  Goal trough 15-20 mcg/mL. Zosyn 3.375g IV q8h (4 hour infusion).  Monitor culture data, renal function and clinical course [facility] at [facility] prn  Height: 5' 9.5" (176.5 cm) Weight: 190 lb (86.183 kg) IBW/kg (Calculated) : 71.85  Temp (24hrs), Avg:96.4 F (35.8 C), Min:95.4 F (35.2 C), Max:97.4 F (36.3 C)   Recent Labs Lab 06/01/15 1844 06/01/15 1847 06/01/15 1851  WBC 18.9*  --   --   CREATININE 1.70*  --  1.40*  LATICACIDVEN  --  8.88*  --     Estimated Creatinine Clearance: 40.7 mL/min (by C-G formula based on Cr of 1.4).    Allergies  Allergen Reactions  . Avelox [Moxifloxacin Hcl In Nacl] Anaphylaxis  . Sulfa Antibiotics   . Timoptic [Timolol Maleate] Other (See Comments)    Congestion    Antimicrobials this admission: Vanc 5/8 >>  Zosyn 5/8 >>   Dose adjustments this admission: n/a  Microbiology results: 5/8 BCx: sent  5/8 UCx: sent    Sputum:    MRSA PCR:    Andrey Cota. Diona Foley, PharmD, [place] Clinical Pharmacist Pager [phone] 06/01/2015 7:58 PM

## 2015-06-01 NOTE — Patient Instructions (Addendum)
Augmentin 875mg  Twice daily  , take w/ food.  Prednisone taper over next week.  Mucinex DM Twice daily  As needed  Cough/congestion .  Please contact office for sooner follow up if symptoms do not improve or worsen or seek emergency care  follow up Dr. Melvyn Novas  As planned and As needed

## 2015-06-01 NOTE — Assessment & Plan Note (Signed)
Exacerbation   Plan  Augmentin 875mg  Twice daily  , take w/ food.  Prednisone taper over next week.  Mucinex DM Twice daily  As needed  Cough/congestion .  Please contact office for sooner follow up if symptoms do not improve or worsen or seek emergency care  follow up Dr. Melvyn Novas  As planned and As needed

## 2015-06-01 NOTE — Progress Notes (Signed)
RT NOTE:  Pt transported to 2MW1 without event. Report given to [place], RT.

## 2015-06-01 NOTE — H&P (Signed)
PULMONARY / CRITICAL CARE MEDICINE   Name: Chad [name] MRN: [id number] DOB: 02-06-1929    ADMISSION DATE:  06/01/2015 CONSULTATION DATE:  06/01/2015  REFERRING MD: EDP  CHIEF COMPLAINT:  SOB  HISTORY OF PRESENT ILLNESS:  82 year old male past medical history as below, which is significant for Gold 2 COPD(followed by MW), diabetes, stroke, moderate dementia with behavioral disturbance, and hypertension. He was recently admitted to [facility] in February 2017 for sepsis secondary to a left upper lobe pneumonia. He was treated with broad-spectrum antibiotics and IV fluids for hypotension and eventually discharged home. He was recently seen 5/1 the pulmonary clinic for routine follow-up at which time he felt as though his breathing was doing pretty well, however, 5/8 he again presented to the pulmonary clinic with worsening shortness of breath. He was felt to be suffering from COPD exacerbation and was prescribed Augmentin and prednisone taper. As the day progressed he became markedly more dyspneic prompting him to call EMS. Upon their arrival he was found to be unresponsive requiring King airway to be placed for ventilation. He was given Solu-Medrol, albuterol, and 2 g of magnesium by EMS personnel and was promptly intubated on arrival to the emergency department. ABG concerning for profound respiratory acidosis (pH 6.9 with CO2 > 100). Staff also reportedly suctioning copious amounts of food like secretions from ET tube raising concern for aspiration. PCCM asked to value the patient for admission.  PAST MEDICAL HISTORY :  He  has a past medical history of Diabetes mellitus without complication ([place]); COPD (chronic obstructive pulmonary disease) ([place]); Stroke [place]); Hypertension; Glaucoma; Diverticulitis; GERD (gastroesophageal reflux disease); Arthritis; High cholesterol; and Renal disorder.  PAST SURGICAL HISTORY: He  has no past surgical history on file.  Allergies  Allergen Reactions  .  Avelox [Moxifloxacin Hcl In Nacl] Anaphylaxis  . Sulfa Antibiotics   . Timoptic [Timolol Maleate] Other (See Comments)    Congestion    No current facility-administered medications on file prior to encounter.   No current outpatient prescriptions on file prior to encounter.    FAMILY HISTORY:  His has no family status information on file.   SOCIAL HISTORY: He    REVIEW OF SYSTEMS:   uanble  SUBJECTIVE:    VITAL SIGNS: BP 161/102 mmHg  Pulse 97  Temp(Src) 95.4 F (35.2 C) (Core (Comment))  Resp 23  Ht 5' 9.5" (1.765 m)  Wt 86.183 kg (190 lb)  BMI 27.67 kg/m2  SpO2 100%  HEMODYNAMICS:    VENTILATOR SETTINGS: Vent Mode:  [-] PRVC FiO2 (%):  [100 %] 100 % Set Rate:  [28 bmp] 28 bmp Vt Set:  [600 mL] 600 mL PEEP:  [5 cmH20] 5 cmH20 Plateau Pressure:  [38 cmH20] 38 cmH20  INTAKE / OUTPUT: I/O last 3 completed shifts: In: -  Out: 10 [Emesis/NG output:10]  PHYSICAL EXAMINATION: General:  Elderly male of normal body habitus in NAD Neuro:  Obtunded on vent HEENT:  [place]/AT, no JVD, R pupil irregular and non-reactive, L pupil sluggish Cardiovascular:  Tachy, no MRG, no lower extremity edema Lungs:  Coarse expiratory wheeze, Food particles being suctioned from ET tube Abdomen:  Soft, nontender, nondistended Musculoskeletal:  No acute deformities Skin:  Grossly intact  LABS:  BMET  Recent Labs Lab 06/01/15 1851  NA 137  K 5.1  CL 106  BUN 29*  CREATININE 1.40*  GLUCOSE 292*    Electrolytes No results for input(s): CALCIUM, MG, PHOS in the last 168 hours.  CBC  Recent Labs  Lab 06/01/15 1844 06/01/15 1851  WBC 18.9*  --   HGB 12.9* 15.0  HCT 42.3 44.0  PLT 298  --     Coag's  Recent Labs Lab 06/01/15 1844  INR 1.21    Sepsis Markers  Recent Labs Lab 06/01/15 1847  LATICACIDVEN 8.88*    ABG No results for input(s): PHART, PCO2ART, PO2ART in the last 168 hours.  Liver Enzymes No results for input(s): AST, ALT, ALKPHOS, BILITOT,  ALBUMIN in the last 168 hours.  Cardiac Enzymes No results for input(s): TROPONINI, PROBNP in the last 168 hours.  Glucose No results for input(s): GLUCAP in the last 168 hours.  Imaging Dg Chest Port 1 View  06/01/2015  CLINICAL DATA:  Shortness of breath for 2 days.  Post intubation. EXAM: PORTABLE CHEST 1 VIEW COMPARISON:  None. FINDINGS: Endotracheal tube is approximately 6.1 cm above the carina. The tip of the nasogastric tube is in the upper abdomen and probably near the GE junction. Densities at the medial left lung base are suggestive for consolidation in the retrocardiac space. Aspiration cannot be excluded. Upper lungs appear to be clear. Heart size is normal. Negative for pneumothorax. IMPRESSION: Left basilar densities are suggestive for consolidation or airspace disease. Findings could represent pneumonia, atelectasis or aspiration. Endotracheal tube is appropriately positioned. Nasogastric tube is near the GE junction. Electronically Signed   By: Markus Daft M.D.   On: 06/01/2015 19:04     STUDIES:  CXR 5/8 > left basilar consolidation  CULTURES: BCx2 5/8 >>> Tracheal aspirate 5/8 >>>  ANTIBIOTICS: Zosyn 5/8 > Vancomycin 5/8 > Azithromycin and ceftriaxone in ED  SIGNIFICANT EVENTS: 5/8 admit  LINES/TUBES: 5/8 ETT  DISCUSSION: 82 y.o. M with hx COPD admitted 05/08 with respiratory distress requiring intubation, likely due to HCAP.  ASSESSMENT / PLAN:  PULMONARY A: Acute on chronic hypercarbic respiratory failure secondary to COPD exacerbation. Probable HCAP. Concern for aspiration pneumonitis. P:   Full vent support. Settings reviewed and adjusted. Scheduled Duoneb, PRN albuterol. IV steroids. CXR in AM. Hold outpatient symbicort.  CARDIOVASCULAR A:  Hx HTN, HLD. P:  Monitor hemodynamics. Continue outpatient ASA. Hold outpatient amlodipine, clonidine, losartan. Trend lactate, trop.  RENAL A:   AKI. P:   NS @ 75. Correct electrolytes as  indicated. BMP in AM.  GASTROINTESTINAL A:   Hx Barrett's esophagus, hiatal hernia, esophagectomy. GI prophylaxis. Nutrition. P:   Protonix for SUP. NPO.  HEMATOLOGIC A:   VTE prophylaxis. P:  SCD's / Heparin. CBC in AM.  INFECTIOUS A:   Sepsis secondary to HCAP vs aspiration PNA. P:   Continue abx as above (vanc / zosyn). Follow cultures. Assess PCT.  ENDOCRINE A:   DM. P:   CBG and SSI.  NEUROLOGIC A:   Acute metabolic encephalopathy. P:   Sedation:  PRN fentanyl / PRN midazolam. RASS goal: 0 Daily WUA. Continue outpatient donepezil. Hold outpatient mirtazapine.   FAMILY  Updates: no family available.  Inter-disciplinary family meet or Palliative Care meeting due by:  5/15.  CC time:  35 minutes.   Montey Hora, [facility] Pager: [phone]  or [phone] 06/01/2015, 8:47 PM  Attending Note:  I have examined patient, reviewed labs, studies and notes. I have discussed the case with Junius Roads, and I agree with the data and plans as amended above. 82 yo man with COPD, HTN, DM, hx stroke and dementia. He was treated in 02/2015 for PNA,  was seen on 5/8 with wheeze and dyspnea. He was treated for an AE-COPD but progressively declined, required EMS and urgent intubation. CXR in the ED shows a LLL infiltrate. On my evaluation he wakes to voice, nods to questions. He is hemodynamically stable on no pressors. Comfortable resp pattern on 0.40+PEEP 5, RR28. He has persistent B exp wheezes. Minimal secretions. Per ED reports he had evidence of aspirated material when he was ET intubated. We will ventilate him, treat w steroids, BD's, abx to cover HCAP or aspiration PNA. Hopefully he will be a candidate for SBT's soon if wheeze improves. Independent critical care time is 45 minutes.   Baltazar Apo, MD, PhD 06/02/2015, 1:18 AM [facility] [phone] or if no answer [phone]

## 2015-06-01 NOTE — Progress Notes (Signed)
CRITICAL VALUE ALERT  Critical value received:  Lactic 5.7  Date of notification:  5/17   Time of notification:  2214  Critical value read back:yes  Nurse who received alert:  Deboraha Sprang, RN  MD notified (1st page):  [place]  Time of first page:  2242

## 2015-06-01 NOTE — Progress Notes (Signed)
[facility] Progress Note Patient Name: Chad [name] DOB: 1929/11/30 MRN: [id number]   Date of Service  06/01/2015  HPI/Events of Note  Hypothermia - Temp  = 94 F.  eICU Interventions  Will order [facility].     Intervention Category Intermediate Interventions: Other:  Lysle Dingwall 06/01/2015, 10:44 PM

## 2015-06-01 NOTE — ED Provider Notes (Signed)
CSN: [id number]     Arrival date & time 06/01/15  1834 History   First MD Initiated Contact with Patient 06/01/15 1834     Chief Complaint  Patient presents with  . Respiratory Distress     (Consider location/radiation/quality/duration/timing/severity/associated sxs/prior Treatment) Patient is a 82 y.o. male presenting with general illness. The history is provided by the EMS personnel.  Illness Location:  Respiratory distress Severity:  Severe Onset quality:  Gradual Duration:  2 days Timing:  Constant Progression:  Worsening Chronicity:  New Context:  Pt brought in EMS for respiratory distress.SOB x2 days, refused transport yesterday.+cough, productive. Unresponsive upon arrival here.En route - 3nebs, 125mg  Solu-medrol and 2g Mag    Past Medical History  Diagnosis Date  . Diabetes mellitus without complication ([place])   . COPD (chronic obstructive pulmonary disease) ([place])   . Stroke ([place])   . Hypertension   . Glaucoma   . Diverticulitis   . GERD (gastroesophageal reflux disease)   . Arthritis   . High cholesterol   . Renal disorder    Past Surgical History  Procedure Laterality Date  . Knee surgery    . Carotid artery angioplasty    . Shoulder surgery    . Basal cell carcinoma excision    . Eye surgery     History reviewed. No pertinent family history. Social History  Substance Use Topics  . Smoking status: None  . Smokeless tobacco: None  . Alcohol Use: None    Review of Systems  Unable to perform ROS: Patient unresponsive      Allergies  Avelox; Timoptic; and Sulfa antibiotics  Home Medications   Prior to Admission medications   Medication Sig Start Date End Date Taking? Authorizing Provider  albuterol (PROVENTIL HFA;VENTOLIN HFA) 108 (90 Base) MCG/ACT inhaler Inhale 2 puffs into the lungs every 4 (four) hours as needed for wheezing or shortness of breath.   Yes Historical Provider, MD  albuterol (PROVENTIL) (2.5 MG/3ML) 0.083% nebulizer solution  Take 2.5 mg by nebulization every 4 (four) hours as needed for wheezing or shortness of breath.   Yes Historical Provider, MD  amLODipine (NORVASC) 5 MG tablet Take 5 mg by mouth daily.   Yes Historical Provider, MD  amoxicillin-clavulanate (AUGMENTIN) 875-125 MG tablet Take 1 tablet by mouth 2 (two) times daily.   Yes Historical Provider, MD  aspirin 81 MG tablet Take 81 mg by mouth daily.   Yes Historical Provider, MD  brimonidine (ALPHAGAN) 0.2 % ophthalmic solution Place 1 drop into the left eye 2 (two) times daily.   Yes Historical Provider, MD  budesonide-formoterol (SYMBICORT) 160-4.5 MCG/ACT inhaler Inhale 2 puffs into the lungs 2 (two) times daily.   Yes Historical Provider, MD  cholecalciferol (VITAMIN D) 1000 units tablet Take 1,000 Units by mouth daily.   Yes Historical Provider, MD  cloNIDine (CATAPRES) 0.2 MG tablet Take 0.1-0.2 mg by mouth See admin instructions. To take as needed id SYSTOLIC BP is over 0000000   Yes Historical Provider, MD  donepezil (ARICEPT) 10 MG tablet Take 10 mg by mouth at bedtime.   Yes Historical Provider, MD  fluticasone (FLONASE) 50 MCG/ACT nasal spray Place 2 sprays into both nostrils 2 (two) times daily as needed for allergies.    Yes Historical Provider, MD  losartan (COZAAR) 100 MG tablet Take 100 mg by mouth daily.   Yes Historical Provider, MD  metFORMIN (GLUCOPHAGE) 500 MG tablet Take 500 mg by mouth daily with breakfast.   Yes Historical Provider, MD  mirtazapine (REMERON) 15 MG tablet Take 1.25 mg by mouth at bedtime.    Yes Historical Provider, MD  omeprazole (PRILOSEC) 20 MG capsule Take 20 mg by mouth daily.   Yes Historical Provider, MD  potassium chloride SA (K-DUR,KLOR-CON) 20 MEQ tablet Take 20 mEq by mouth daily.   Yes Historical Provider, MD  predniSONE (DELTASONE) 10 MG tablet Take 10 mg by mouth daily with breakfast. Tapered dose   Yes Historical Provider, MD  promethazine (PHENERGAN) 25 MG tablet Take 12.5-25 mg by mouth every 6 (six) hours  as needed for nausea or vomiting.   Yes Historical Provider, MD  valACYclovir (VALTREX) 500 MG tablet Take 500 mg by mouth 2 (two) times daily as needed. For Canker Sores   Yes Historical Provider, MD  vitamin B-12 (CYANOCOBALAMIN) 1000 MCG tablet Take 1,000 mcg by mouth daily.   Yes Historical Provider, MD   BP 98/57 mmHg  Pulse 80  Temp(Src) 95 F (35 C) (Core (Comment))  Resp 28  Ht 5' 9.5" (1.765 m)  Wt 86.183 kg  BMI 27.67 kg/m2  SpO2 100% Physical Exam  Constitutional: He appears toxic. He appears ill. He appears distressed.  HENT:  Head: Normocephalic and atraumatic.  Eyes: EOM are normal. Pupils are equal, round, and reactive to light.  Cardiovascular: Regular rhythm and intact distal pulses.  Tachycardia present.   Pulmonary/Chest: Accessory muscle usage present. He is in respiratory distress. He has wheezes. He has rhonchi.  Abdominal: Soft. There is no tenderness.  Musculoskeletal: He exhibits no edema.  Neurological: He is unresponsive. GCS eye subscore is 1. GCS verbal subscore is 1. GCS motor subscore is 1.  Skin: Skin is warm and dry.    ED Course  .Intubation Date/Time: 06/01/2015 7:49 PM Performed by: Maryan Puls Authorized by: Maryan Puls Consent: The procedure was performed in an emergent situation. Patient identity confirmed: arm band Indications: respiratory distress and  airway protection Intubation method: direct Patient status: paralyzed (RSI) Preoxygenation: nonrebreather mask Sedatives: ketmine Paralytic: succinylcholine Laryngoscope size: Mac 3 Tube size: 7.5 mm Tube type: cuffed Number of attempts: 1 Cricoid pressure: yes Cords visualized: yes Post-procedure assessment: chest rise and ETCO2 monitor Breath sounds: equal Cuff inflated: yes ETT to lip: 24 cm Tube secured with: ETT holder Chest x-ray interpreted by me and radiologist.   (including critical care time) Labs Review Labs Reviewed  CBC WITH DIFFERENTIAL/PLATELET -  Abnormal; Notable for the following:    WBC 18.9 (*)    Hemoglobin 12.9 (*)    RDW 15.8 (*)    Lymphs Abs 10.1 (*)    Monocytes Absolute 1.7 (*)    Eosinophils Absolute 1.1 (*)    All other components within normal limits  COMPREHENSIVE METABOLIC PANEL - Abnormal; Notable for the following:    CO2 17 (*)    Glucose, Bld 306 (*)    BUN 22 (*)    Creatinine, Ser 1.70 (*)    GFR calc non Af Amer 35 (*)    GFR calc Af Amer 41 (*)    Anion gap 17 (*)    All other components within normal limits  PROTIME-INR - Abnormal; Notable for the following:    Prothrombin Time 15.4 (*)    All other components within normal limits  URINALYSIS, ROUTINE W REFLEX MICROSCOPIC (NOT AT [facility]) - Abnormal; Notable for the following:    APPearance CLOUDY (*)    Glucose, UA 500 (*)    Hgb urine dipstick SMALL (*)    Protein, ur  100 (*)    All other components within normal limits  BRAIN NATRIURETIC PEPTIDE - Abnormal; Notable for the following:    B Natriuretic Peptide 174.9 (*)    All other components within normal limits  PHOSPHORUS - Abnormal; Notable for the following:    Phosphorus 9.0 (*)    All other components within normal limits  MAGNESIUM - Abnormal; Notable for the following:    Magnesium 3.7 (*)    All other components within normal limits  URINE MICROSCOPIC-ADD ON - Abnormal; Notable for the following:    Squamous Epithelial / LPF 0-5 (*)    Bacteria, UA FEW (*)    Casts HYALINE CASTS (*)    All other components within normal limits  TROPONIN I - Abnormal; Notable for the following:    Troponin I 0.16 (*)    All other components within normal limits  LACTIC ACID, PLASMA - Abnormal; Notable for the following:    Lactic Acid, Venous 5.7 (*)    All other components within normal limits  LACTIC ACID, PLASMA - Abnormal; Notable for the following:    Lactic Acid, Venous 5.7 (*)    All other components within normal limits  GLUCOSE, CAPILLARY - Abnormal; Notable for the following:     Glucose-Capillary 297 (*)    All other components within normal limits  GLUCOSE, CAPILLARY - Abnormal; Notable for the following:    Glucose-Capillary 303 (*)    All other components within normal limits  I-STAT CHEM 8, ED - Abnormal; Notable for the following:    BUN 29 (*)    Creatinine, Ser 1.40 (*)    Glucose, Bld 292 (*)    Calcium, Ion 1.34 (*)    All other components within normal limits  I-STAT CG4 LACTIC ACID, ED - Abnormal; Notable for the following:    Lactic Acid, Venous 8.88 (*)    All other components within normal limits  I-STAT ARTERIAL BLOOD GAS, ED - Abnormal; Notable for the following:    pH, Arterial 7.273 (*)    pCO2 arterial 55.4 (*)    pO2, Arterial 456.0 (*)    Bicarbonate 26.3 (*)    All other components within normal limits  POCT I-STAT 3, ART BLOOD GAS (G3+) - Abnormal; Notable for the following:    pH, Arterial 7.298 (*)    pCO2 arterial 32.4 (*)    pO2, Arterial 72.0 (*)    Bicarbonate 16.1 (*)    Acid-base deficit 10.0 (*)    All other components within normal limits  MRSA PCR SCREENING  URINE CULTURE  CULTURE, BLOOD (ROUTINE X 2)  CULTURE, BLOOD (ROUTINE X 2)  CULTURE, RESPIRATORY (NON-EXPECTORATED)  TROPONIN I  LIPASE, BLOOD  PROCALCITONIN  BLOOD GAS, ARTERIAL  BLOOD GAS, ARTERIAL  TROPONIN I  TROPONIN I  BLOOD GAS, ARTERIAL  CBC  MAGNESIUM  PHOSPHORUS  BASIC METABOLIC PANEL  PROCALCITONIN  I-STAT CG4 LACTIC ACID, ED  I-STAT CG4 LACTIC ACID, ED    Imaging Review Dg Chest Port 1 View  06/01/2015  CLINICAL DATA:  Shortness of breath for 2 days.  Post intubation. EXAM: PORTABLE CHEST 1 VIEW COMPARISON:  None. FINDINGS: Endotracheal tube is approximately 6.1 cm above the carina. The tip of the nasogastric tube is in the upper abdomen and probably near the GE junction. Densities at the medial left lung base are suggestive for consolidation in the retrocardiac space. Aspiration cannot be excluded. Upper lungs appear to be clear. Heart size  is normal. Negative for  pneumothorax. IMPRESSION: Left basilar densities are suggestive for consolidation or airspace disease. Findings could represent pneumonia, atelectasis or aspiration. Endotracheal tube is appropriately positioned. Nasogastric tube is near the GE junction. Electronically Signed   By: Markus Daft M.D.   On: 06/01/2015 19:04   I have personally reviewed and evaluated these images and lab results as part of my medical decision-making.   EKG Interpretation None      MDM   Final diagnoses:  Acute hypercapnic respiratory failure (HCC)  Acute respiratory acidosis  COPD with exacerbation ([place])    82 year old male with history of COPD presenting with acute hypoxic and hypercarbic respiratory failure and severe sepsis.  Symptoms present the last couple days. Was visited by EMS yesterday. Refused transfer to the emergency department at that time. Progressive of symptoms with worsening shortness of breath and cough today. Initially hypoxic and in respiratory distress with EMS. Started on BiPAP. Became progressively more lethargic. He received bronchodilators, Solu-Medrol, magnesium en route.  On arrival the patient is a GCS of 3. Respiratory distress. Intubated the patient as above for airway protection and respiratory failure. Started the patient on appropriate antibiotics. Culture sent. Lactic acidosis of nearly 9. Pertinent for his 30 mL/kg bolus.  Started the patient on sedation. We'll admit the patient to the ICU for further intervention and observation.  Patient admitted in stable condition.  Maryan Puls, MD 06/02/15 [id number]  Varney Biles, MD 06/02/15 [id number]

## 2015-06-01 NOTE — ED Notes (Signed)
Pt brought in EMS for respiratory distress.  Pt has been [facility] x2 days but refused transport yesterday.  Pt unresponsive upon arrival with king airway.  Pt received 3nebs, 125mg  Solu-medrol and 2g Mag. PTA

## 2015-06-01 NOTE — Progress Notes (Signed)
RT NOTE:  Critical ABG uploaded to EPIC. ABG results expected by MD. Georgann Housekeeper, NP following pt. RT will collect ABG @ 2300

## 2015-06-01 NOTE — Progress Notes (Signed)
Pharmacy Code Sepsis Protocol  Time of code sepsis page: [id number] [x]  Antibiotics delivered at 1900 []  Antibiotics administered prior to code at  (if checked, omit next 2 questions)  Were antibiotics ordered at the time of the code sepsis page? Yes Was it required to contact the physician? []  Physician not contacted []  Physician contacted to order antibiotics for code sepsis []  Physician contacted to recommend changing antibiotics  Pharmacy consulted for: n/a  Anti-infectives    Start     Dose/Rate Route Frequency Ordered Stop   06/01/15 1900  cefTRIAXone (ROCEPHIN) 1 g in dextrose 5 % 50 mL IVPB     1 g 100 mL/hr over 30 Minutes Intravenous  Once 06/01/15 1849     06/01/15 1900  azithromycin (ZITHROMAX) 500 mg in dextrose 5 % 250 mL IVPB     500 mg 250 mL/hr over 60 Minutes Intravenous  Once 06/01/15 1849          Nurse education provided: [x]  Minutes left to administer antibiotics to achieve 1 hour goal [x]  Correct order of antibiotic administration [x]  Antibiotic Y-site compatibilities     Andrey Cota. Diona Foley, PharmD, [place] Clinical Pharmacist Pager [phone] 06/01/2015, 6:52 PM

## 2015-06-02 ENCOUNTER — Inpatient Hospital Stay (HOSPITAL_COMMUNITY): Payer: Medicare HMO

## 2015-06-02 DIAGNOSIS — J189 Pneumonia, unspecified organism: Secondary | ICD-10-CM

## 2015-06-02 DIAGNOSIS — J441 Chronic obstructive pulmonary disease with (acute) exacerbation: Secondary | ICD-10-CM | POA: Insufficient documentation

## 2015-06-02 LAB — TROPONIN I
TROPONIN I: 0.33 ng/mL — AB (ref <0.031)
TROPONIN I: 0.67 ng/mL — AB (ref <0.031)
TROPONIN I: 0.63 ng/mL — AB (ref <0.031)

## 2015-06-02 LAB — GLUCOSE, CAPILLARY
GLUCOSE-CAPILLARY: 255 mg/dL — AB (ref 65–99)
GLUCOSE-CAPILLARY: 187 mg/dL — AB (ref 65–99)
Glucose-Capillary: 220 mg/dL — ABNORMAL HIGH (ref 65–99)
Glucose-Capillary: 227 mg/dL — ABNORMAL HIGH (ref 65–99)
Glucose-Capillary: 227 mg/dL — ABNORMAL HIGH (ref 65–99)
Glucose-Capillary: 303 mg/dL — ABNORMAL HIGH (ref 65–99)
Glucose-Capillary: 316 mg/dL — ABNORMAL HIGH (ref 65–99)

## 2015-06-02 LAB — BASIC METABOLIC PANEL
ANION GAP: 18 — AB (ref 5–15)
BUN: 25 mg/dL — ABNORMAL HIGH (ref 6–20)
CALCIUM: 8.5 mg/dL — AB (ref 8.9–10.3)
CO2: 16 mmol/L — AB (ref 22–32)
Chloride: 102 mmol/L (ref 101–111)
Creatinine, Ser: 1.49 mg/dL — ABNORMAL HIGH (ref 0.61–1.24)
GFR calc Af Amer: 48 mL/min — ABNORMAL LOW (ref >60)
GFR calc non Af Amer: 42 mL/min — ABNORMAL LOW (ref >60)
GLUCOSE: 346 mg/dL — AB (ref 65–99)
Potassium: 4.3 mmol/L (ref 3.5–5.1)
Sodium: 136 mmol/L (ref 135–145)

## 2015-06-02 LAB — CBC
HCT: 35.6 % — ABNORMAL LOW (ref 39.0–52.0)
HEMOGLOBIN: 10.9 g/dL — AB (ref 13.0–17.0)
MCH: 25.7 pg — AB (ref 26.0–34.0)
MCHC: 30.6 g/dL (ref 30.0–36.0)
MCV: 84 fL (ref 78.0–100.0)
PLATELETS: 190 10*3/uL (ref 150–400)
RBC: 4.24 MIL/uL (ref 4.22–5.81)
RDW: 15.9 % — ABNORMAL HIGH (ref 11.5–15.5)
WBC: 13.3 10*3/uL — AB (ref 4.0–10.5)

## 2015-06-02 LAB — PROCALCITONIN: PROCALCITONIN: 1.17 ng/mL

## 2015-06-02 LAB — MRSA PCR SCREENING: MRSA by PCR: NEGATIVE

## 2015-06-02 LAB — PATHOLOGIST SMEAR REVIEW

## 2015-06-02 LAB — LIPASE, BLOOD: Lipase: 26 U/L (ref 11–51)

## 2015-06-02 LAB — LACTIC ACID, PLASMA: LACTIC ACID, VENOUS: 5.7 mmol/L — AB (ref 0.5–2.0)

## 2015-06-02 LAB — MAGNESIUM: MAGNESIUM: 2 mg/dL (ref 1.7–2.4)

## 2015-06-02 LAB — PHOSPHORUS: PHOSPHORUS: 1.6 mg/dL — AB (ref 2.5–4.6)

## 2015-06-02 MED ORDER — VITAL HIGH PROTEIN PO LIQD: 1000.0000 mL | ORAL | Status: DC

## 2015-06-02 MED ORDER — FENTANYL CITRATE (PF) 100 MCG/2ML IJ SOLN
INTRAMUSCULAR | Status: AC
  Administered 2015-06-02: 100 ug
  Filled 2015-06-02: qty 2

## 2015-06-02 MED ORDER — VANCOMYCIN HCL IN DEXTROSE 1-5 GM/200ML-% IV SOLN
1000.0000 mg | INTRAVENOUS | Status: DC
  Administered 2015-06-03: 1000 mg via INTRAVENOUS
  Filled 2015-06-02: qty 200

## 2015-06-02 MED ORDER — MIDAZOLAM HCL 2 MG/2ML IJ SOLN
INTRAMUSCULAR | Status: AC
  Administered 2015-06-02: 2 mg
  Filled 2015-06-02: qty 2

## 2015-06-02 MED ORDER — SODIUM PHOSPHATES 45 MMOLE/15ML IV SOLN
30.0000 mmol | Freq: Once | INTRAVENOUS | Status: AC
  Administered 2015-06-02: 30 mmol via INTRAVENOUS
  Filled 2015-06-02: qty 10

## 2015-06-02 MED ORDER — ANTISEPTIC ORAL RINSE SOLUTION (CORINZ)
7.0000 mL | Freq: Four times a day (QID) | OROMUCOSAL | Status: DC
  Administered 2015-06-02 (×4): 7 mL via OROMUCOSAL

## 2015-06-02 MED ORDER — INSULIN ASPART 100 UNIT/ML ~~LOC~~ SOLN: 10.0000 [IU] | SUBCUTANEOUS | Status: DC

## 2015-06-02 MED ORDER — VITAL AF 1.2 CAL PO LIQD
1000.0000 mL | ORAL | Status: DC
  Administered 2015-06-02: 1000 mL

## 2015-06-02 MED ORDER — PRO-STAT SUGAR FREE PO LIQD
30.0000 mL | Freq: Two times a day (BID) | ORAL | Status: AC
  Administered 2015-06-02: 30 mL
  Filled 2015-06-02 (×2): qty 30

## 2015-06-02 MED ORDER — CHLORHEXIDINE GLUCONATE 0.12% ORAL RINSE (MEDLINE KIT)
15.0000 mL | Freq: Two times a day (BID) | OROMUCOSAL | Status: DC
  Administered 2015-06-02 (×3): 15 mL via OROMUCOSAL

## 2015-06-02 MED ORDER — METHYLPREDNISOLONE SODIUM SUCC 125 MG IJ SOLR
60.0000 mg | Freq: Two times a day (BID) | INTRAMUSCULAR | Status: DC
  Administered 2015-06-02 – 2015-06-03 (×2): 60 mg via INTRAVENOUS
  Filled 2015-06-02: qty 0.96

## 2015-06-02 MED ORDER — INSULIN GLARGINE 100 UNIT/ML ~~LOC~~ SOLN
10.0000 [IU] | Freq: Every day | SUBCUTANEOUS | Status: DC
  Administered 2015-06-02 – 2015-06-09 (×8): 10 [IU] via SUBCUTANEOUS
  Filled 2015-06-02 (×8): qty 0.1

## 2015-06-02 MED ORDER — INSULIN ASPART 100 UNIT/ML ~~LOC~~ SOLN
10.0000 [IU] | Freq: Once | SUBCUTANEOUS | Status: AC
  Administered 2015-06-02: 10 [IU] via SUBCUTANEOUS

## 2015-06-02 NOTE — Procedures (Signed)
Intubation Procedure Note Chad [name] [id number] 1930/01/03  Procedure: Intubation Indications: Respiratory insufficiency  Procedure Details Consent: Unable to obtain consent because of emergent medical necessity. Time Out: Verified patient identification, verified procedure, site/side was marked, verified correct patient position, special equipment/implants available, medications/allergies/relevent history reviewed, required imaging and test results available.  Performed  Maximum sterile technique was used including cap, gloves, gown, hand hygiene and mask.  MAC and 3    Evaluation Hemodynamic Status: BP stable throughout; O2 sats: stable throughout Patient's Current Condition: stable Complications: No apparent complications Patient did tolerate procedure well. Chest X-ray ordered to verify placement.  CXR: pending.  Pt intubated using Mac #3 with 7.5 ett secured at 24 at the lip on 1st attempt. Pt stable throughout with no complications. Positive color change on etco2, bilateral BS (rhonchi/coarse) throughout, CXR pending. RT will continue to monitor.    Jesse Sans 06/02/2015

## 2015-06-02 NOTE — Progress Notes (Signed)
Pharmacy Antibiotic Note  Chad [name] is a 82 y.o. male admitted on 06/01/2015 with pneumonia.  Pharmacy has been consulted for vancomycin and Zosyn dosing.  Will adjust antibiotics based on labs this AM.   Plan: - Change vanc to 1gm IV Q24H, start tomorrow - Zosyn 3.375gm IV Q8H, 4 hr infusion - Monitor renal fxn, clinical progress, vanc trough as indicated - F/U phos supplementation, CBGs, nutrition   Height: 5' 9.5" (176.5 cm) Weight: 172 lb 6.4 oz (78.2 kg) IBW/kg (Calculated) : 71.85  Temp (24hrs), Avg:97.1 F (36.2 C), Min:94.8 F (34.9 C), Max:100.9 F (38.3 C)   Recent Labs Lab 06/01/15 1844 06/01/15 1847 06/01/15 1851 06/01/15 2119 06/01/15 2344 06/02/15 0200  WBC 18.9*  --   --   --   --  13.3*  CREATININE 1.70*  --  1.40*  --   --  1.49*  LATICACIDVEN  --  8.88*  --  5.7* 5.7*  --     Estimated Creatinine Clearance: 38.2 mL/min (by C-G formula based on Cr of 1.49).    Allergies  Allergen Reactions  . Avelox [Moxifloxacin Hcl In Nacl] Anaphylaxis  . Timoptic [Timolol Maleate] Other (See Comments)    Congestion  . Sulfa Antibiotics Rash    Antimicrobials this admission: Vanc 5/8 >> Zosyn 5/8 >> Azith 5/8 >> 5/8 CTX 5/8 >> 8/5 Augmentin PTA  Dose adjustments this admission: n/a  Microbiology results: 5/8 BCx x2 - 5/8 TA - 5/8 UCx -  5/8 MRSA PCR - negative   Chad [name] D. Mina Marble, PharmD, BCPS Pager:  [phone] 06/02/2015, 7:34 AM

## 2015-06-02 NOTE — Progress Notes (Signed)
CRITICAL VALUE ALERT  Critical value received:  Troponin 0.67  Date of notification:  06/02/15  Time of notification:  0830  Critical value read back:Yes.    Nurse who received alert:  Dorena Cookey  MD notified (1st page):  Elsworth Soho  Time of first page:  0845   Responding MD:  Elsworth Soho  Time MD responded:  [phone]

## 2015-06-02 NOTE — Progress Notes (Signed)
Inpatient Diabetes Program Recommendations  AACE/ADA: New Consensus Statement on Inpatient Glycemic Control (2015)  Target Ranges:  Prepandial:   less than 140 mg/dL      Peak postprandial:   less than 180 mg/dL (1-2 hours)      Critically ill patients:  140 - 180 mg/dL   Results for Chad [name], Chad [name] (MRN [id number]) as of 06/02/2015 09:21  Ref. Range 06/01/2015 21:39 06/02/2015 00:05 06/02/2015 03:48 06/02/2015 06:06 06/02/2015 07:23  Glucose-Capillary Latest Ref Range: 65-99 mg/dL 297 (H) 303 (H) 316 (H) 255 (H) 227 (H)   Review of Glycemic Control  Diabetes history: DM 2 Outpatient Diabetes medications: Metformin 500 mg Daily Current orders for Inpatient glycemic control: Novolog 2-6 units Q4hrs, Lantus 10 units Daily (initiated today)  Inpatient Diabetes Program Recommendations: Insulin - IV drip/GlucoStabilizer: Patient meets criteria for insulin gtt per ICU protocol. Insulin - Basal: Note Lantus 10 units being started this am. Glucose trends in the 200-300 range. Fasting glucose 227 this am. Patient will also be starting tube feeds. Will watch trends for now. Insulin - Meal Coverage: If glucose continues to be elevated. IV insulin gtt is indicated, however if not started will probably require Tube Feed coverage Q4hrs.  Thanks,  Tama Headings RN, MSN, [facility] Inpatient Diabetes Coordinator Team Pager [phone] (8a-5p)

## 2015-06-02 NOTE — Procedures (Signed)
Bronchoscopy Procedure Note RAFEAL SARDINAS [id number] [birth year]  Procedure: Bronchoscopy Indications: Diagnostic evaluation of the airways, Obtain specimens for culture and/or other diagnostic studies and Remove secretions  Procedure Details Consent: Risks of procedure as well as the alternatives and risks of each were explained to the (patient/caregiver).  Consent for procedure obtained. Time Out: Verified patient identification, verified procedure, site/side was marked, verified correct patient position, special equipment/implants available, medications/allergies/relevent history reviewed, required imaging and test results available.  Performed  In preparation for procedure, patient was given 100% FiO2 and bronchoscope lubricated. Sedation: Benzodiazepines -versed 1 mg, fentanyl 100 mcg  Airway entered and the following bronchi were examined: Bronchi.   Procedures performed: BAL performed Bronchoscope removed.  , Patient placed back on 100% FiO2 at conclusion of procedure.    Evaluation Hemodynamic Status: BP stable throughout; O2 sats: stable throughout Patient's Current Condition: stable Specimens:  Sent purulent fluid- BAL Complications: No apparent complications Patient did tolerate procedure well.   Shey Yott V. 06/02/2015

## 2015-06-02 NOTE — Progress Notes (Signed)
[facility] Progress Note Patient Name: Chad [name] DOB: 1929-03-17 MRN: [id number]   Date of Service  06/02/2015  HPI/Events of Note  CBG 303 after 6 u novolog.   eICU Interventions  Will give 10 u [place] now. Check cbg in 2 hrs, if still elevated, start drip per protocol.      Intervention Category Intermediate Interventions: Hyperglycemia - evaluation and treatment  Rush Landmark 06/02/2015, 3:55 AM

## 2015-06-02 NOTE — Progress Notes (Signed)
Initial Nutrition Assessment  DOCUMENTATION CODES:   Not applicable  INTERVENTION:    Initiate TF via OGT with Vital AF 1.2 at 25 ml/h; increase to goal rate of 70 ml/h (1680 ml per day) to provide 2016 kcals, 126 gm protein, 1361 ml free water daily.  Prostat 30 ml via tube BID on day 1.  NUTRITION DIAGNOSIS:   Inadequate oral intake related to inability to eat as evidenced by NPO status.  GOAL:   Patient will meet greater than or equal to 90% of their needs  MONITOR:   Vent status, Labs, Weight trends, TF tolerance, I & O's  REASON FOR ASSESSMENT:   Consult Enteral/tube feeding initiation and management  ASSESSMENT:   82 year old male past medical history significant for Gold 2 COPD, diabetes, stroke, moderate dementia with behavioral disturbance, and hypertension. Brought in by EMS on 5/8, intubated.   Labs reviewed: phosphorus low at 1.6. Nutrition-Focused physical exam completed. Findings are no fat depletion, no muscle depletion, and no edema.  Patient is currently intubated on ventilator support MV: 13.2 L/min Temp (24hrs), Avg:97.8 F (36.6 C), Min:94.8 F (34.9 C), Max:100.9 F (38.3 C)  Propofol: none   Diet Order:  Diet NPO time specified  Skin:  Reviewed, no issues  Last BM:  PTA  Height:   Ht Readings from Last 1 Encounters:  06/01/15 5' 9.5" (1.765 m)    Weight:   Wt Readings from Last 1 Encounters:  06/01/15 172 lb 6.4 oz (78.2 kg)    Ideal Body Weight:  74.1 kg  BMI:  Body mass index is 25.1 kg/(m^2).  Estimated Nutritional Needs:   Kcal:  2015  Protein:  110-125 gm  Fluid:  2.1 L  EDUCATION NEEDS:   No education needs identified at this time   Molli Barrows, [place], [place], [place] Pager [phone] After Hours Pager [phone]

## 2015-06-02 NOTE — Progress Notes (Signed)
PULMONARY / CRITICAL CARE MEDICINE   Name: Chad [name] MRN: [id number] DOB: [birth year]    ADMISSION DATE:  06/01/2015 CONSULTATION DATE:  06/01/2015  REFERRING MD: EDP  CHIEF COMPLAINT:  SOB  HISTORY OF PRESENT ILLNESS:  82 year old male past medical history as below, which is significant for Gold 2 COPD(followed by MW), diabetes, stroke, moderate dementia with behavioral disturbance, and hypertension. He was recently admitted to [facility] in February 2017 for sepsis secondary to a left upper lobe pneumonia. He was treated with broad-spectrum antibiotics and IV fluids for hypotension and eventually discharged home. He was recently seen 5/1 the pulmonary clinic for routine follow-up at which time he felt as though his breathing was doing pretty well, however, 5/8 he again presented to the pulmonary clinic with worsening shortness of breath. He was felt to be suffering from COPD exacerbation and was prescribed Augmentin and prednisone taper. As the day progressed he became markedly more dyspneic prompting him to call EMS. Upon their arrival he was found to be unresponsive requiring King airway to be placed for ventilation. He was given Solu-Medrol, albuterol, and 2 g of magnesium by EMS personnel and was promptly intubated on arrival to the emergency department. ABG concerning for profound respiratory acidosis (pH 6.9 with CO2 > 100). Staff also reportedly suctioning copious amounts of food like secretions from ET tube raising concern for aspiration. PCCM asked to value the patient for admission.   SUBJECTIVE:  Poor UO Low gr fever Awake on no sedation  VITAL SIGNS: BP 129/60 mmHg  Pulse 89  Temp(Src) 100.8 F (38.2 C) (Core (Comment))  Resp 25  Ht 5' 9.5" (1.765 m)  Wt 172 lb 6.4 oz (78.2 kg)  BMI 25.10 kg/m2  SpO2 100%  HEMODYNAMICS:    VENTILATOR SETTINGS: Vent Mode:  [-] PRVC FiO2 (%):  [40 %-100 %] 40 % Set Rate:  [28 bmp-285 bmp] 28 bmp Vt Set:  [580 mL-600 mL] 580  mL PEEP:  [5 cmH20] 5 cmH20 Plateau Pressure:  [19 cmH20-38 cmH20] 19 cmH20  INTAKE / OUTPUT: I/O last 3 completed shifts: In: [id number] [I.V.:1635; RA:[id number]; IV Piggyback:36] Out: 70 [Urine:45; Emesis/NG output:10]  PHYSICAL EXAMINATION: General:  Elderly male of normal body habitus in NAD Neuro:  awake on vent, non focal HEENT:  [place]/AT, no JVD, R pupil irregular and non-reactive, L pupil sluggish Cardiovascular:  Tachy, no MRG, no lower extremity edema Lungs:  Decreased BS BL Abdomen:  Soft, nontender, nondistended Musculoskeletal:  No acute deformities Skin:  Grossly intact  LABS:  BMET  Recent Labs Lab 06/01/15 1844 06/01/15 1851 06/02/15 0200  NA 135 137 136  K 5.0 5.1 4.3  CL 101 106 102  CO2 17*  --  16*  BUN 22* 29* 25*  CREATININE 1.70* 1.40* 1.49*  GLUCOSE 306* 292* 346*    Electrolytes  Recent Labs Lab 06/01/15 1844 06/02/15 0200  CALCIUM 10.1 8.5*  MG 3.7* 2.0  PHOS 9.0* 1.6*    CBC  Recent Labs Lab 06/01/15 1844 06/01/15 1851 06/02/15 0200  WBC 18.9*  --  13.3*  HGB 12.9* 15.0 10.9*  HCT 42.3 44.0 35.6*  PLT 298  --  190    Coag's  Recent Labs Lab 06/01/15 1844  INR 1.21    Sepsis Markers  Recent Labs Lab 06/01/15 1847 06/01/15 2119 06/01/15 2344 06/02/15 0200  LATICACIDVEN 8.88* 5.7* 5.7*  --   PROCALCITON  --  <0.10  --  1.17    ABG  Recent Labs Lab  06/01/15 1945 06/01/15 2255  PHART 7.273* 7.298*  PCO2ART 55.4* 32.4*  PO2ART 456.0* 72.0*    Liver Enzymes  Recent Labs Lab 06/01/15 1844  AST 33  ALT 22  ALKPHOS 88  BILITOT 0.4  ALBUMIN 4.1    Cardiac Enzymes  Recent Labs Lab 06/01/15 2119 06/02/15 0200 06/02/15 0733  TROPONINI 0.16* 0.33* 0.67*    Glucose  Recent Labs Lab 06/01/15 2139 06/02/15 0005 06/02/15 0348 06/02/15 0606 06/02/15 0723  GLUCAP 297* 303* 316* 255* 227*    Imaging Dg Chest Port 1 View  06/02/2015  CLINICAL DATA:  Respiratory failure, COPD, healthcare associated  pneumonia, intubated patient. EXAM: PORTABLE CHEST 1 VIEW COMPARISON:  Portable chest x-ray of Jun 01, 2015 FINDINGS: The lungs are adequately inflated. Persistent retrocardiac density on the left is demonstrated. Infrahilar increased density on the right has developed. The heart is normal in size. The pulmonary vascularity is not engorged. The endotracheal tube tip lies 2.0 cm above the carina. The esophagogastric tube tip does not lie below the hemidiaphragm though may lie in a hiatal hernia if any. IMPRESSION: Increasing infrahilar density on the right. Persistent left lower lobe atelectasis or pneumonia. Low positioning of the endotracheal tube. Withdrawal by 3-5 cm is recommended. Inadequate positioning of the NG tube. Advancement by approximately 10 cm is recommended. Electronically Signed   By: David  Martinique M.D.   On: 06/02/2015 07:15   Dg Chest Port 1 View  06/01/2015  CLINICAL DATA:  Shortness of breath for 2 days.  Post intubation. EXAM: PORTABLE CHEST 1 VIEW COMPARISON:  None. FINDINGS: Endotracheal tube is approximately 6.1 cm above the carina. The tip of the nasogastric tube is in the upper abdomen and probably near the GE junction. Densities at the medial left lung base are suggestive for consolidation in the retrocardiac space. Aspiration cannot be excluded. Upper lungs appear to be clear. Heart size is normal. Negative for pneumothorax. IMPRESSION: Left basilar densities are suggestive for consolidation or airspace disease. Findings could represent pneumonia, atelectasis or aspiration. Endotracheal tube is appropriately positioned. Nasogastric tube is near the GE junction. Electronically Signed   By: Markus Daft M.D.   On: 06/01/2015 19:04     STUDIES:  CXR 5/8 > left basilar consolidation  CULTURES: BCx2 5/8 >>> Tracheal aspirate 5/8 >>>  ANTIBIOTICS: Zosyn 5/8 > Vancomycin 5/8 > Azithromycin and ceftriaxone in ED  SIGNIFICANT EVENTS: 5/8 admit  LINES/TUBES: 5/8  ETT  DISCUSSION: 82 y.o. M with hx COPD admitted 05/08 with respiratory distress requiring intubation, likely due to HCAP.  ASSESSMENT / PLAN:  PULMONARY A: Acute on chronic hypercarbic respiratory failure secondary to COPD exacerbation. Probable HCAP/ LLL atelectasis. Concern for aspiration pneumonitis. P:   SBTs Scheduled Duoneb, PRN albuterol. IV solumedrol 60 q8 Hold outpatient symbicort. Bronchoscopy at bedside to r/o endobronchial lesion LLL  CARDIOVASCULAR A:  Hx HTN, HLD. Elevated trop P:  Continue outpatient ASA. Hold outpatient amlodipine, clonidine, losartan. Follow trop until peak  RENAL A:   AKI. hypophos P:   NS @ 75. Correct electrolytes as indicated. BMP in AM.  GASTROINTESTINAL A:   Hx Barrett's esophagus, hiatal hernia, esophagectomy. GI prophylaxis. Nutrition. P:   Protonix for SUP. NPO. Start TFs  HEMATOLOGIC A:   VTE prophylaxis. P:  SCD's / Heparin. CBC in AM.  INFECTIOUS A:   Sepsis secondary to HCAP vs aspiration PNA. P:   Continue abx as above (vanc / zosyn). Follow cultures. follow PCT.  ENDOCRINE A:   DM. P:  CBG and SSI.  NEUROLOGIC A:   Acute metabolic encephalopathy. P:   Sedation:  PRN fentanyl / PRN midazolam. RASS goal: 0 Daily WUA. Continue outpatient donepezil. Hold outpatient mirtazapine.   FAMILY  Updates: no family available.  Inter-disciplinary family meet or Palliative Care meeting due by:  5/15.  CC time:  35 minutes.   Kara Mead MD. Shade Flood. [place] [facility] Pager [phone] If no response call [phone]   06/02/2015    06/02/2015, 8:46 AM

## 2015-06-02 NOTE — Progress Notes (Signed)
Critical ABG results hand delivered to Dr Kathrynn Humble. Vent Settings Adjusted (RR increased to 28, VT increased to 600). RT will continue to monitor.

## 2015-06-02 NOTE — Care Management Note (Addendum)
Case Management Note  Patient Details  Name: NASHID MENZE MRN: [id number] Date of Birth: [birth year]  Subjective/Objective:    Pt admitted with resp distress - now ventilated                Action/Plan:  Pt is from home with wife.  Per wife; pt has some dementia but can mostly  independently maneuver at home with minimal assistance from wife, wife is with pt 24 hours a day and denies barriers with pt returning home with PTA functionalilty.  CM will continue to follow for disposition needs   Expected Discharge Date:                  Expected Discharge Plan:  [place]  In-House Referral:     Discharge planning Services  CM Consult  Post Acute Care Choice:    Choice offered to:     DME Arranged:    DME Agency:     HH Arranged:    HH Agency:     Status of Service:  In process, will continue to follow  Medicare Important Message Given:    Date Medicare IM Given:    Medicare IM give by:    Date Additional Medicare IM Given:    Additional Medicare Important Message give by:     If discussed at [place] of Stay Meetings, dates discussed:    Additional Comments:  Maryclare Labrador, RN 06/02/2015, 11:59 AM

## 2015-06-02 NOTE — Progress Notes (Signed)
Chart and office note reviewed in detail  > agree with a/p as outlined    

## 2015-06-03 ENCOUNTER — Inpatient Hospital Stay (HOSPITAL_COMMUNITY): Payer: Medicare HMO

## 2015-06-03 DIAGNOSIS — J189 Pneumonia, unspecified organism: Secondary | ICD-10-CM

## 2015-06-03 DIAGNOSIS — J441 Chronic obstructive pulmonary disease with (acute) exacerbation: Secondary | ICD-10-CM

## 2015-06-03 DIAGNOSIS — J9601 Acute respiratory failure with hypoxia: Secondary | ICD-10-CM

## 2015-06-03 LAB — BASIC METABOLIC PANEL
Anion gap: 13 (ref 5–15)
BUN: 24 mg/dL — AB (ref 6–20)
CALCIUM: 8.6 mg/dL — AB (ref 8.9–10.3)
CHLORIDE: 109 mmol/L (ref 101–111)
CO2: 20 mmol/L — ABNORMAL LOW (ref 22–32)
CREATININE: 1.38 mg/dL — AB (ref 0.61–1.24)
GFR calc non Af Amer: 46 mL/min — ABNORMAL LOW (ref >60)
GFR, EST AFRICAN AMERICAN: 53 mL/min — AB (ref >60)
Glucose, Bld: 214 mg/dL — ABNORMAL HIGH (ref 65–99)
Potassium: 3.8 mmol/L (ref 3.5–5.1)
SODIUM: 142 mmol/L (ref 135–145)

## 2015-06-03 LAB — CBC
HCT: 33.8 % — ABNORMAL LOW (ref 39.0–52.0)
Hemoglobin: 10.6 g/dL — ABNORMAL LOW (ref 13.0–17.0)
MCH: 25.9 pg — AB (ref 26.0–34.0)
MCHC: 31.4 g/dL (ref 30.0–36.0)
MCV: 82.6 fL (ref 78.0–100.0)
PLATELETS: 207 10*3/uL (ref 150–400)
RBC: 4.09 MIL/uL — AB (ref 4.22–5.81)
RDW: 16.4 % — AB (ref 11.5–15.5)
WBC: 16.8 10*3/uL — AB (ref 4.0–10.5)

## 2015-06-03 LAB — URINE CULTURE: CULTURE: NO GROWTH

## 2015-06-03 LAB — GLUCOSE, CAPILLARY
GLUCOSE-CAPILLARY: 208 mg/dL — AB (ref 65–99)
GLUCOSE-CAPILLARY: 232 mg/dL — AB (ref 65–99)
GLUCOSE-CAPILLARY: 238 mg/dL — AB (ref 65–99)
Glucose-Capillary: 130 mg/dL — ABNORMAL HIGH (ref 65–99)
Glucose-Capillary: 152 mg/dL — ABNORMAL HIGH (ref 65–99)
Glucose-Capillary: 159 mg/dL — ABNORMAL HIGH (ref 65–99)
Glucose-Capillary: 163 mg/dL — ABNORMAL HIGH (ref 65–99)

## 2015-06-03 LAB — PROCALCITONIN: Procalcitonin: 1.35 ng/mL

## 2015-06-03 MED ORDER — METHYLPREDNISOLONE SODIUM SUCC 125 MG IJ SOLR: 60.0000 mg | INTRAMUSCULAR | Status: DC

## 2015-06-03 MED ORDER — DONEPEZIL HCL 10 MG PO TABS
10.0000 mg | ORAL_TABLET | Freq: Every day | ORAL | Status: DC
  Administered 2015-06-03 – 2015-06-08 (×6): 10 mg via ORAL
  Filled 2015-06-03 (×7): qty 1

## 2015-06-03 MED ORDER — VITAMIN B-12 1000 MCG PO TABS
1000.0000 ug | ORAL_TABLET | Freq: Every day | ORAL | Status: DC
  Administered 2015-06-03 – 2015-06-09 (×7): 1000 ug via ORAL
  Filled 2015-06-03 (×10): qty 1

## 2015-06-03 MED ORDER — HYDRALAZINE HCL 20 MG/ML IJ SOLN
10.0000 mg | INTRAMUSCULAR | Status: DC | PRN
  Administered 2015-06-06: 10 mg via INTRAVENOUS
  Filled 2015-06-03 (×2): qty 1

## 2015-06-03 MED ORDER — BRIMONIDINE TARTRATE 0.2 % OP SOLN: 1.0000 [drp] | Freq: Two times a day (BID) | OPHTHALMIC | Status: DC

## 2015-06-03 MED ORDER — FLUTICASONE PROPIONATE 50 MCG/ACT NA SUSP
2.0000 | Freq: Two times a day (BID) | NASAL | Status: DC | PRN
  Filled 2015-06-03: qty 16

## 2015-06-03 MED ORDER — VITAMIN D 1000 UNITS PO TABS
1000.0000 [IU] | ORAL_TABLET | Freq: Every day | ORAL | Status: DC
  Administered 2015-06-03 – 2015-06-09 (×7): 1000 [IU] via ORAL
  Filled 2015-06-03 (×8): qty 1

## 2015-06-03 MED ORDER — TRAZODONE HCL 50 MG PO TABS
50.0000 mg | ORAL_TABLET | Freq: Every evening | ORAL | Status: DC | PRN
  Administered 2015-06-03 – 2015-06-04 (×2): 50 mg via ORAL
  Filled 2015-06-03 (×4): qty 1

## 2015-06-03 MED ORDER — ASPIRIN EC 81 MG PO TBEC
81.0000 mg | DELAYED_RELEASE_TABLET | Freq: Every day | ORAL | Status: DC
  Administered 2015-06-03 – 2015-06-09 (×7): 81 mg via ORAL
  Filled 2015-06-03 (×8): qty 1

## 2015-06-03 MED ORDER — AMLODIPINE BESYLATE 5 MG PO TABS
5.0000 mg | ORAL_TABLET | Freq: Every day | ORAL | Status: DC
  Administered 2015-06-03 – 2015-06-09 (×7): 5 mg via ORAL
  Filled 2015-06-03 (×7): qty 1

## 2015-06-03 MED ORDER — METHYLPREDNISOLONE SODIUM SUCC 40 MG IJ SOLR
40.0000 mg | INTRAMUSCULAR | Status: DC
  Administered 2015-06-04 – 2015-06-07 (×4): 40 mg via INTRAVENOUS
  Filled 2015-06-03 (×4): qty 1

## 2015-06-03 MED ORDER — BRIMONIDINE TARTRATE 0.15 % OP SOLN
1.0000 [drp] | Freq: Two times a day (BID) | OPHTHALMIC | Status: DC
  Administered 2015-06-03 – 2015-06-09 (×13): 1 [drp] via OPHTHALMIC
  Filled 2015-06-03: qty 5

## 2015-06-03 MED ORDER — LOSARTAN POTASSIUM 50 MG PO TABS
100.0000 mg | ORAL_TABLET | Freq: Every day | ORAL | Status: DC
  Administered 2015-06-04 – 2015-06-09 (×6): 100 mg via ORAL
  Filled 2015-06-03 (×7): qty 2

## 2015-06-03 MED ORDER — VANCOMYCIN HCL 10 G IV SOLR: 1250.0000 mg | INTRAVENOUS | Status: DC

## 2015-06-03 NOTE — Progress Notes (Signed)
Attempted to call report to RN on 3E. RN is currently transferring a patient and will call back.

## 2015-06-03 NOTE — Progress Notes (Signed)
PULMONARY / CRITICAL CARE MEDICINE   Name: Chad [name] MRN: [id number] DOB: [birth year]    ADMISSION DATE:  06/01/2015 CONSULTATION DATE:  06/01/2015  REFERRING MD: EDP  CHIEF COMPLAINT:  SOB  HISTORY OF PRESENT ILLNESS:  82 year old male past medical history as below, which is significant for Gold 2 COPD(followed by MW), diabetes, stroke, moderate dementia with behavioral disturbance, and hypertension. He was recently admitted to [facility] in February 2017 for sepsis secondary to a left upper lobe pneumonia. He was treated with broad-spectrum antibiotics and IV fluids for hypotension and eventually discharged home. He was recently seen 5/1 the pulmonary clinic for routine follow-up at which time he felt as though his breathing was doing pretty well, however, 5/8 he again presented to the pulmonary clinic with worsening shortness of breath. He was felt to be suffering from COPD exacerbation and was prescribed Augmentin and prednisone taper. As the day progressed he became markedly more dyspneic prompting him to call EMS. Upon their arrival he was found to be unresponsive requiring King airway to be placed for ventilation. He was given Solu-Medrol, albuterol, and 2 g of magnesium by EMS personnel and was promptly intubated on arrival to the emergency department. ABG concerning for profound respiratory acidosis (pH 6.9 with CO2 > 100). Staff also reportedly suctioning copious amounts of food like secretions from ET tube raising concern for aspiration. PCCM asked to value the patient for admission.   SUBJECTIVE:  Self extubated  UO picking up afebrile   VITAL SIGNS: BP 127/77 mmHg  Pulse 93  Temp(Src) 98.6 F (37 C) (Core (Comment))  Resp 21  Ht 5' 9.5" (1.765 m)  Wt 172 lb 13.5 oz (78.4 kg)  BMI 25.17 kg/m2  SpO2 96%  HEMODYNAMICS:    VENTILATOR SETTINGS: Vent Mode:  [-] PRVC FiO2 (%):  [40 %] 40 % Set Rate:  [22 bmp-28 bmp] 22 bmp Vt Set:  [580 mL] 580 mL PEEP:  [5 cmH20] 5  cmH20 Plateau Pressure:  [14 cmH20-24 cmH20] 17 cmH20  INTAKE / OUTPUT: I/O last 3 completed shifts: In: 6560.2 [I.V.:3435; RA:2506596; NG/GT:279.2; IV Piggyback:771] Out: 1145 [Urine:1145]  PHYSICAL EXAMINATION: General:  Elderly male of normal body habitus in NAD Neuro:  awake , non focal HEENT:  [place]/AT, no JVD, R pupil irregular and non-reactive, L pupil sluggish Cardiovascular:  Tachy, no MRG, no lower extremity edema Lungs:  Decreased BS BL Abdomen:  Soft, nontender, nondistended Musculoskeletal:  No acute deformities Skin:  Grossly intact  LABS:  BMET  Recent Labs Lab 06/01/15 1844 06/01/15 1851 06/02/15 0200 06/03/15 0222  NA 135 137 136 142  K 5.0 5.1 4.3 3.8  CL 101 106 102 109  CO2 17*  --  16* 20*  BUN 22* 29* 25* 24*  CREATININE 1.70* 1.40* 1.49* 1.38*  GLUCOSE 306* 292* 346* 214*    Electrolytes  Recent Labs Lab 06/01/15 1844 06/02/15 0200 06/03/15 0222  CALCIUM 10.1 8.5* 8.6*  MG 3.7* 2.0  --   PHOS 9.0* 1.6*  --     CBC  Recent Labs Lab 06/01/15 1844 06/01/15 1851 06/02/15 0200 06/03/15 0222  WBC 18.9*  --  13.3* 16.8*  HGB 12.9* 15.0 10.9* 10.6*  HCT 42.3 44.0 35.6* 33.8*  PLT 298  --  190 207    Coag's  Recent Labs Lab 06/01/15 1844  INR 1.21    Sepsis Markers  Recent Labs Lab 06/01/15 1847 06/01/15 2119 06/01/15 2344 06/02/15 0200 06/03/15 0222  LATICACIDVEN 8.88* 5.7* 5.7*  --   --  PROCALCITON  --  <0.10  --  1.17 1.35    ABG  Recent Labs Lab 06/01/15 1945 06/01/15 2255  PHART 7.273* 7.298*  PCO2ART 55.4* 32.4*  PO2ART 456.0* 72.0*    Liver Enzymes  Recent Labs Lab 06/01/15 1844  AST 33  ALT 22  ALKPHOS 88  BILITOT 0.4  ALBUMIN 4.1    Cardiac Enzymes  Recent Labs Lab 06/02/15 0200 06/02/15 0733 06/02/15 1040  TROPONINI 0.33* 0.67* 0.63*    Glucose  Recent Labs Lab 06/02/15 1148 06/02/15 1519 06/02/15 1915 06/02/15 2338 06/03/15 0329 06/03/15 0725  GLUCAP 227* 220* 187*  232* 208* 238*    Imaging Dg Chest Port 1 View  06/03/2015  CLINICAL DATA:  Respiratory failure with shortness of breath EXAM: PORTABLE CHEST 1 VIEW COMPARISON:  Yesterday FINDINGS: Tracheal and esophageal extubation. On the first image there is lower lung volumes with streaky opacity, but transient based on subsequent x-ray. There is suggestion of a hiatal hernia based on nasogastric tube positioning yesterday. IMPRESSION: 1. Stable aeration after extubation. 2. Persistent dense opacity behind the heart that correlates with history of pneumonia. Based on nasogastric tube positioning yesterday there could also be a hiatal hernia or dilated esophagus. Electronically Signed   By: Monte Fantasia M.D.   On: 06/03/2015 07:24     STUDIES:  CXR 5/8 > left basilar consolidation  CULTURES: BCx2 5/8 >>>ng Tracheal aspirate 5/8 >>> BAL 5/9   ANTIBIOTICS: Zosyn 5/8 > Vancomycin 5/8 > 5/10 Azithromycin and ceftriaxone in ED  SIGNIFICANT EVENTS: 5/8 admit  LINES/TUBES: 5/8 ETT  DISCUSSION: 82 y.o. M with hx COPD admitted 05/08 with respiratory distress requiring intubation, likely due to HCAP.  ASSESSMENT / PLAN:  PULMONARY A: Acute on chronic hypercarbic respiratory failure secondary to COPD exacerbation. Probable HCAP/ LLL atelectasis. Concern for aspiration pneumonitis. 5/9 Bronchoscopy at bedside -no endobronchial lesion LLL thick secretions  P:   Scheduled Duoneb, PRN albuterol. IV solumedrol 40 -stop in 24h  Hold outpatient symbicort.  CARDIOVASCULAR A:  Hx HTN, HLD. Elevated trop -demand ischemia P:  Continue outpatient ASA. Hold outpatient amlodipine, clonidine, losartan. Follow trop until peak  RENAL A:   AKI. hypophos P:   NS @ 50. Correct electrolytes as indicated. BMP in AM.  GASTROINTESTINAL A:   Hx Barrett's esophagus, hiatal hernia, esophagectomy. Dysphagia P:   Protonix for SUP. NPO. Swallow eval  HEMATOLOGIC A:   VTE prophylaxis. P:   SCD's / Heparin. CBC in AM.  INFECTIOUS A:   Sepsis secondary to HCAP vs aspiration PNA. P:   Continue zosyn Dc vanc Follow BAL cultures. follow PCT for duration - 5-7ds  ENDOCRINE A:   DM. P:   CBG and SSI. Lantus added Steroids tapered  NEUROLOGIC A:   Acute metabolic encephalopathy. P:   Continue outpatient donepezil. Ct outpatient mirtazapine. PT consult  FAMILY  -wife  Inter-disciplinary family meet or Palliative Care meeting due by:  5/15.  CC time:  31 minutes.   Kara Mead MD. Shade Flood. [place] [facility] Pager [phone] If no response call [phone]    06/03/2015, 11:23 AM

## 2015-06-03 NOTE — Progress Notes (Signed)
Patient came the floor around 1800 alert and oriented x2, denies pain, no shortness of breath. V/s stable, bp is elevated. Patient family at bedside. Will continue to monitor patient.

## 2015-06-03 NOTE — Progress Notes (Signed)
[place] Progress Note Patient Name: Chad [name] DOB: December 14, 1929 MRN: [id number]   Date of Service  06/03/2015  HPI/Events of Note  Pt self extubated.  Looks great. Conversant. Not in distress. Clear voice but weak cough.  130/57, 111, 20, 96% on RA  No audible wheezing or stridor heard.  Denies OSA  eICU Interventions  Observe for now. May need BiPaP.         [place] 06/03/2015, 1:50 AM

## 2015-06-03 NOTE — Progress Notes (Signed)
Pharmacy Antibiotic Note  Chad [name] is a 82 y.o. male admitted on 06/01/2015 with pneumonia.  Pharmacy has been consulted for vancomycin and Zosyn dosing.  Patient's renal function is improving   Plan: - Change vanc to 1250mg  IV Q24H, start tomorrow - Zosyn 3.375gm IV Q8H, 4 hr infusion - Monitor renal fxn, clinical progress, vanc trough as indicated, abx LOT   Height: 5' 9.5" (176.5 cm) Weight: 172 lb 13.5 oz (78.4 kg) IBW/kg (Calculated) : 71.85  Temp (24hrs), Avg:99.3 F (37.4 C), Min:98.2 F (36.8 C), Max:100.8 F (38.2 C)   Recent Labs Lab 06/01/15 1844 06/01/15 1847 06/01/15 1851 06/01/15 2119 06/01/15 2344 06/02/15 0200 06/03/15 0222  WBC 18.9*  --   --   --   --  13.3* 16.8*  CREATININE 1.70*  --  1.40*  --   --  1.49* 1.38*  LATICACIDVEN  --  8.88*  --  5.7* 5.7*  --   --     Estimated Creatinine Clearance: 41.2 mL/min (by C-G formula based on Cr of 1.38).    Allergies  Allergen Reactions  . Avelox [Moxifloxacin Hcl In Nacl] Anaphylaxis  . Timoptic [Timolol Maleate] Other (See Comments)    Congestion  . Sulfa Antibiotics Rash    Antimicrobials this admission: Vanc 5/8 >> Zosyn 5/8 >> Azith 5/8 >> 5/8 CTX 5/8 >> 8/5 Augmentin PTA  Dose adjustments this admission: n/a  Microbiology results: 5/8 BCx x2 - NGTD 5/8 UCx - pending 5/9 BAL -    Idy Rawling D. Mina Marble, PharmD, BCPS Pager:  [phone] 06/03/2015, 7:07 AM

## 2015-06-03 NOTE — Progress Notes (Signed)
[place] Progress Note Patient Name: Chad [name] DOB: 01-04-1930 MRN: [id number]   Date of Service  06/03/2015  HPI/Events of Note  Tolerating diet.  eICU Interventions  Reorder home meds.     Intervention Category Major Interventions: Other:  [name] 06/03/2015, 4:47 PM

## 2015-06-03 NOTE — Progress Notes (Signed)
Inpatient Diabetes Program Recommendations  AACE/ADA: New Consensus Statement on Inpatient Glycemic Control (2015)  Target Ranges:  Prepandial:   less than 140 mg/dL      Peak postprandial:   less than 180 mg/dL (1-2 hours)      Critically ill patients:  140 - 180 mg/dL   Results for EUGUNE, LOCKNER (MRN [id number]) as of 06/03/2015 09:23  Ref. Range 06/02/2015 15:19 06/02/2015 19:15 06/02/2015 23:38 06/03/2015 03:29 06/03/2015 07:25  Glucose-Capillary Latest Ref Range: 65-99 mg/dL 220 (H) 187 (H) 232 (H) 208 (H) 238 (H)   Review of Glycemic Control  Diabetes history: DM 2 Outpatient Diabetes medications: Metformin 500 mg Daily Current orders for Inpatient glycemic control: Novolog 2-6 units Q4hrs, Lantus 10 units Daily (initiated 5/9)  Inpatient Diabetes Program Recommendations:  Insulin - IV drip/GlucoStabilizer: Patient meets criteria for insulin gtt per ICU protocol. Glucose trends in the 200 range.  Patient started on tube feeds yesterday. Please consider starting Tube Feed Coverage Novolog 3 units Q4hrs while on tube feeds only, Stop when tube feeds are d/c'd.   Thanks,  Tama Headings RN, MSN, [facility] Inpatient Diabetes Coordinator Team Pager [phone] (8a-5p)

## 2015-06-03 NOTE — Evaluation (Signed)
Clinical/Bedside Swallow Evaluation Patient Details  Name: JOHNCHRISTOPHER YAMAZAKI MRN: [id number] Date of Birth: 02-May-1929  Today's Date: 06/03/2015 Time: SLP Start Time (ACUTE ONLY): 1208 SLP Stop Time (ACUTE ONLY): 1230 SLP Time Calculation (min) (ACUTE ONLY): 22 min  Past Medical History:  Past Medical History  Diagnosis Date  . Diabetes mellitus without complication ([place])   . COPD (chronic obstructive pulmonary disease) ([place])   . Stroke ([place])   . Hypertension   . Glaucoma   . Diverticulitis   . GERD (gastroesophageal reflux disease)   . Arthritis   . High cholesterol   . Renal disorder    Past Surgical History:  Past Surgical History  Procedure Laterality Date  . Knee surgery    . Carotid artery angioplasty    . Shoulder surgery    . Basal cell carcinoma excision    . Eye surgery     HPI:  82 year old male past medical history as below, which is significant for Gold 2 COPD(followed by MW), diabetes, stroke, moderate dementia with behavioral disturbance, and hypertension. He was recently admitted to [facility] in February 2017 for sepsis secondary to a left upper lobe pneumonia. He was treated with broad-spectrum antibiotics and IV fluids for hypotension and eventually discharged home. He was recently seen 5/1 the pulmonary clinic for routine follow-up at which time he felt as though his breathing was doing pretty well, however, 5/8 he again presented to the pulmonary clinic with worsening shortness of breath. He was felt to be suffering from COPD exacerbation and was prescribed Augmentin and prednisone taper. As the day progressed he became markedly more dyspneic prompting him to call EMS. Upon their arrival he was found to be unresponsive requiring King airway to be placed for ventilation. He was given Solu-Medrol, albuterol, and 2 g of magnesium by EMS personnel and was promptly intubated on arrival to the emergency department. ABG concerning for profound respiratory acidosis (pH 6.9  with CO2 > 100). Staff also reportedly suctioning copious amounts of food like secretions from ET tube raising concern for aspiration.    Assessment / Plan / Recommendation Clinical Impression   Pt did not exhibit any overt s/s of aspiration during BSE; pt was exhibiting mild wheezing when SLP arrived, but this was d/t recent transfer with nursing to chair prior to BSE and not associated with po intake; pt is impulsive, so d/t pt's recent intubation/hx of PNA and cognitive deficits, he is a mild risk for aspiration d/t these factors.  Recommend Regular diet/thin liquids (carb-modified) and ST will f/u for diet tolerance.    Aspiration Risk  Mild aspiration risk    Diet Recommendation  Regular/thin (carb-modified)   Medication Administration: Whole meds with liquid    Other  Recommendations Oral Care Recommendations: Oral care BID   Follow up Recommendations  None    Frequency and Duration min 2x/week  1 week       Prognosis Prognosis for Safe Diet Advancement: Good      Swallow Study   General Date of Onset: 06/01/15 HPI: 82 year old male past medical history as below, which is significant for Gold 2 COPD(followed by MW), diabetes, stroke, moderate dementia with behavioral disturbance, and hypertension. He was recently admitted to [facility] in February 2017 for sepsis secondary to a left upper lobe pneumonia. He was treated with broad-spectrum antibiotics and IV fluids for hypotension and eventually discharged home. He was recently seen 5/1 the pulmonary clinic for routine follow-up at which time he felt as though  his breathing was doing pretty well, however, 5/8 he again presented to the pulmonary clinic with worsening shortness of breath. He was felt to be suffering from COPD exacerbation and was prescribed Augmentin and prednisone taper. As the day progressed he became markedly more dyspneic prompting him to call EMS. Upon their arrival he was found to be unresponsive requiring King  airway to be placed for ventilation. He was given Solu-Medrol, albuterol, and 2 g of magnesium by EMS personnel and was promptly intubated on arrival to the emergency department. ABG concerning for profound respiratory acidosis (pH 6.9 with CO2 > 100). Staff also reportedly suctioning copious amounts of food like secretions from ET tube raising concern for aspiration.  Type of Study: Bedside Swallow Evaluation Previous Swallow Assessment: none found in hx Diet Prior to this Study: NPO Temperature Spikes Noted: No Respiratory Status: Room air History of Recent Intubation: Yes Length of Intubations (days): 1 days Date extubated: 06/02/15 (self-extubated) Behavior/Cognition: Alert;Cooperative Oral Cavity Assessment: Within Functional Limits Oral Care Completed by SLP: Recent completion by staff Oral Cavity - Dentition: Adequate natural dentition Vision: Functional for self-feeding Self-Feeding Abilities: Able to feed self Patient Positioning: Upright in chair Baseline Vocal Quality: Other (comment) (mild wheezing upon SLP arrival; recently ambulated to chair) Volitional Cough: Strong Volitional Swallow: Able to elicit    Oral/Motor/Sensory Function Overall Oral Motor/Sensory Function: Within functional limits   Ice Chips Ice chips: Within functional limits Presentation: Spoon   Thin Liquid Thin Liquid: Within functional limits Presentation: Cup;Straw    Nectar Thick Nectar Thick Liquid: Not tested   Honey Thick Honey Thick Liquid: Not tested   Puree Puree: Within functional limits Presentation: Spoon   Solid      Solid: Within functional limits Presentation: Self Fed        Kingstyn Deruiter,PAT, M.S., CCC-SLP 06/03/2015,12:47 PM

## 2015-06-03 NOTE — Progress Notes (Signed)
RT called by RN stating patient self extubated.  ATF patient sitting up in bed, conscious, and in no apparent respiratory distress.  Patient able to speak clearly and oriented to self/place.  No audible stridor. LS clear bilaterally.  Weak, dry cough present.  Patient able to obtain 1250-1500 on IS with little coaching.  At this time patient denies respiratory distress.    SpO2: 96% on room air. HR: 106 RR: 18, non-labored  Plan to monitor patient closely for complications, but leave to rest at this time.  RT will continue to monitor.

## 2015-06-03 NOTE — Clinical Documentation Improvement (Signed)
Critical Care  Based on the clinical findings below, please document any associated diagnoses/conditions the patient has or may have.   Comatose  Other  Clinically Undetermined  Supporting Information: ED: PT comes in unresponsive, Unresponsive upon arrival here.En route - 3nebs, 125mg  Solu-medrol and 2g Mag  Neurological: He is unresponsive. GCS eye subscore is 1. GCS verbal subscore is 1. GCS motor subscore is 1.  On arrival the patient is a GCS of 3. Respiratory distress. Intubated the patient   H&P:he became markedly more dyspneic prompting him to call EMS. Upon their arrival he was found to be unresponsive requiring King airway to be placed for ventilation.  Neuro: Obtunded on vent   Please exercise your independent, professional judgment when responding. A specific answer is not anticipated or expected.   Thank You, [place] [phone]

## 2015-06-03 NOTE — Progress Notes (Signed)
Pt. Requesting medicaition to help him sleep and anxiety. Wife also at bedside and requesting that pt. Has Remeron that he takes nightly at home. Text page sent to on call NP, K. Baltazar Najjar. RN will continue to monitor pt. For changes in condition. Arend Bahl, Katherine Roan

## 2015-06-04 ENCOUNTER — Inpatient Hospital Stay (HOSPITAL_COMMUNITY): Payer: Medicare HMO

## 2015-06-04 LAB — GLUCOSE, CAPILLARY
GLUCOSE-CAPILLARY: 218 mg/dL — AB (ref 65–99)
GLUCOSE-CAPILLARY: 168 mg/dL — AB (ref 65–99)
Glucose-Capillary: 140 mg/dL — ABNORMAL HIGH (ref 65–99)
Glucose-Capillary: 99 mg/dL (ref 65–99)
Glucose-Capillary: 179 mg/dL — ABNORMAL HIGH (ref 65–99)

## 2015-06-04 MED ORDER — MIRTAZAPINE 15 MG PO TABS
7.5000 mg | ORAL_TABLET | Freq: Every day | ORAL | Status: DC
  Administered 2015-06-04 – 2015-06-08 (×5): 7.5 mg via ORAL
  Filled 2015-06-04 (×5): qty 1

## 2015-06-04 MED ORDER — IPRATROPIUM-ALBUTEROL 0.5-2.5 (3) MG/3ML IN SOLN
3.0000 mL | Freq: Three times a day (TID) | RESPIRATORY_TRACT | Status: DC
  Administered 2015-06-04 – 2015-06-05 (×4): 3 mL via RESPIRATORY_TRACT
  Filled 2015-06-04 (×4): qty 3

## 2015-06-04 MED ORDER — INSULIN ASPART 100 UNIT/ML ~~LOC~~ SOLN
2.0000 [IU] | Freq: Three times a day (TID) | SUBCUTANEOUS | Status: DC
  Administered 2015-06-04 (×2): 4 [IU] via SUBCUTANEOUS
  Administered 2015-06-05: 2 [IU] via SUBCUTANEOUS

## 2015-06-04 NOTE — Care Management Important Message (Signed)
Important Message  Patient Details  Name: Chad [name] MRN: [id number] Date of Birth: [birth year]   Medicare Important Message Given:  Yes    Nailah Luepke P Roxsana Riding 06/04/2015, 2:31 PM

## 2015-06-04 NOTE — Progress Notes (Addendum)
RN called to notify of patient with tendency to lean to left - wife states this has been his state since his intubation while in the ICU.  PT working with patient - noticed left lean tendency.  LSW unknown - according to wife prior to intubation on admission to hospital.  RN Evelina Bucy to call attending with update.  Follow up:  Attending at bedside - orders noted.    Follow up: 1 pm:  Patient alert- no focal deficits noted.  Wife reports again he was like this yesterday.  RN to call as needed.

## 2015-06-04 NOTE — Progress Notes (Signed)
Speech Language Pathology Treatment: Dysphagia  Patient Details Name: Chad [name] MRN: [id number] DOB: Aug 29, 1929 Today's Date: 06/04/2015 Time: [id number] SLP Time Calculation (min) (ACUTE ONLY): 10 min  Assessment / Plan / Recommendation Clinical Impression  F/u after yesterday's clinical swallow evaluation.  Pt awaiting MRI due to new onset leaning to left.  No focal CN deficits.  Pt reports no difficulty swallowing during breakfast or lunch.  During reassessment today, he tolerated water well until end of session, with large, successive boluses eliciting cough.  Lung sounds are clear.  Reminded pt to pace himself, small bites/sips, which eliminates cough.  Given hx,  will f/u one time more to ensure safety with intake. Pt agrees.    HPI HPI: 82 year old male past medical history as below, which is significant for Gold 2 COPD(followed by MW), diabetes, stroke, moderate dementia with behavioral disturbance, and hypertension. He was recently admitted to [facility] in February 2017 for sepsis secondary to a left upper lobe pneumonia. He was treated with broad-spectrum antibiotics and IV fluids for hypotension and eventually discharged home. He was recently seen 5/1 the pulmonary clinic for routine follow-up at which time he felt as though his breathing was doing pretty well, however, 5/8 he again presented to the pulmonary clinic with worsening shortness of breath. He was felt to be suffering from COPD exacerbation and was prescribed Augmentin and prednisone taper. As the day progressed he became markedly more dyspneic prompting him to call EMS. Upon their arrival he was found to be unresponsive requiring King airway to be placed for ventilation. He was given Solu-Medrol, albuterol, and 2 g of magnesium by EMS personnel and was promptly intubated on arrival to the emergency department. ABG concerning for profound respiratory acidosis (pH 6.9 with CO2 > 100). Staff also reportedly suctioning copious  amounts of food like secretions from ET tube raising concern for aspiration.       SLP Plan  Continue with current plan of care     Recommendations  Diet recommendations: Regular;Thin liquid Liquids provided via: Cup Medication Administration: Whole meds with liquid Supervision: Patient able to self feed Compensations: Small sips/bites Postural Changes and/or Swallow Maneuvers: Seated upright 90 degrees             Oral Care Recommendations: Oral care BID Follow up Recommendations: None Plan: Continue with current plan of care     GO                [name] 06/04/2015, 2:29 PM

## 2015-06-04 NOTE — Consult Note (Signed)
Admission H&P    Chief Complaint: New onset and leaning to the left side with stroke demonstrated on MRI.  HPI: Chad [name] is an 82 y.o. male history of COPD, diabetes mellitus, hypertension, hyperlipidemia and previous stroke, admitted on 06/01/2015 for pneumonia and respiratory failure requiring intubation and mechanical ventilation. Patient is improved with antibiotic therapy and managed to self extubated himself yesterday. He did not require reintubation. His wife noted that he was leaning to the left side and his left arm and wrist were kept in flexed posture. No weakness was noted. He has slight left lower facial droop from previous stroke which is unchanged. There's been no change in speech or swallowing. MRI of the brain showed acute infarctions involving the right parietal lobe as well as right occipital and posterior temporal lobes. There was also a small area of infarction involving the left parietal lobe. NIH stroke score was 4.  LSN: Unclear tPA Given: No: Unclear when last known well mRankin:  Past Medical History  Diagnosis Date  . Diabetes mellitus without complication ([place])   . COPD (chronic obstructive pulmonary disease) ([place])   . Stroke ([place])   . Hypertension   . Glaucoma   . Diverticulitis   . GERD (gastroesophageal reflux disease)   . Arthritis   . High cholesterol   . Renal disorder     Past Surgical History  Procedure Laterality Date  . Knee surgery    . Carotid artery angioplasty    . Shoulder surgery    . Basal cell carcinoma excision    . Eye surgery      History reviewed. No pertinent family history. Social History:  has no tobacco, alcohol, and drug history on file.  Allergies:  Allergies  Allergen Reactions  . Avelox [Moxifloxacin Hcl In Nacl] Anaphylaxis  . Timoptic [Timolol Maleate] Other (See Comments)    Congestion  . Sulfa Antibiotics Rash    Medications Prior to Admission  Medication Sig Dispense Refill  . albuterol (PROVENTIL  HFA;VENTOLIN HFA) 108 (90 Base) MCG/ACT inhaler Inhale 2 puffs into the lungs every 4 (four) hours as needed for wheezing or shortness of breath.    Marland Kitchen albuterol (PROVENTIL) (2.5 MG/3ML) 0.083% nebulizer solution Take 2.5 mg by nebulization every 4 (four) hours as needed for wheezing or shortness of breath.    Marland Kitchen amLODipine (NORVASC) 5 MG tablet Take 5 mg by mouth daily.    Marland Kitchen aspirin 81 MG tablet Take 81 mg by mouth daily.    . brimonidine (ALPHAGAN) 0.2 % ophthalmic solution Place 1 drop into the left eye 2 (two) times daily.    . budesonide-formoterol (SYMBICORT) 160-4.5 MCG/ACT inhaler Inhale 2 puffs into the lungs 2 (two) times daily.    . cholecalciferol (VITAMIN D) 1000 units tablet Take 1,000 Units by mouth daily.    . cloNIDine (CATAPRES) 0.2 MG tablet Take 0.1-0.2 mg by mouth See admin instructions. To take as needed id SYSTOLIC BP is over 053    . donepezil (ARICEPT) 10 MG tablet Take 10 mg by mouth at bedtime.    . fluticasone (FLONASE) 50 MCG/ACT nasal spray Place 2 sprays into both nostrils 2 (two) times daily as needed for allergies.     Marland Kitchen losartan (COZAAR) 100 MG tablet Take 100 mg by mouth daily.    . metFORMIN (GLUCOPHAGE) 500 MG tablet Take 500 mg by mouth daily with breakfast.    . mirtazapine (REMERON) 15 MG tablet Take 15 mg by mouth at bedtime.     Marland Kitchen  omeprazole (PRILOSEC) 20 MG capsule Take 20 mg by mouth daily.    . potassium chloride SA (K-DUR,KLOR-CON) 20 MEQ tablet Take 20 mEq by mouth daily.    . predniSONE (DELTASONE) 10 MG tablet Take 10 mg by mouth daily with breakfast. Tapered dose    . promethazine (PHENERGAN) 25 MG tablet Take 12.5-25 mg by mouth every 6 (six) hours as needed for nausea or vomiting.    . valACYclovir (VALTREX) 500 MG tablet Take 500 mg by mouth 2 (two) times daily as needed. For [employer]    . vitamin B-12 (CYANOCOBALAMIN) 1000 MCG tablet Take 1,000 mcg by mouth daily.    . [DISCONTINUED] amoxicillin-clavulanate (AUGMENTIN) 875-125 MG tablet Take  1 tablet by mouth 2 (two) times daily.      ROS: History obtained from spouse  General ROS: negative for - chills, fatigue, fever, night sweats, weight gain or weight loss Psychological ROS: Known moderate dementia Ophthalmic ROS: negative for - blurry vision, double vision, eye pain or loss of vision ENT ROS: negative for - epistaxis, nasal discharge, oral lesions, sore throat, tinnitus or vertigo Allergy and Immunology ROS: negative for - hives or itchy/watery eyes Hematological and Lymphatic ROS: negative for - bleeding problems, bruising or swollen lymph nodes Endocrine ROS: negative for - galactorrhea, hair pattern changes, polydipsia/polyuria or temperature intolerance Respiratory ROS: As noted in present illness Cardiovascular ROS: negative for - chest pain, dyspnea on exertion, edema or irregular heartbeat Gastrointestinal ROS: negative for - abdominal pain, diarrhea, hematemesis, nausea/vomiting or stool incontinence Genito-Urinary ROS: negative for - dysuria, hematuria, incontinence or urinary frequency/urgency Musculoskeletal ROS: negative for - joint swelling or muscular weakness Neurological ROS: as noted in HPI Dermatological ROS: negative for rash and skin lesion changes  Physical Examination: Blood pressure 140/70, pulse 67, temperature 98.1 F (36.7 C), temperature source Oral, resp. rate 18, height _0  (1.753 m), weight 75.705 kg (166 lb 14.4 oz), SpO2 94 %.  HEENT-  Normocephalic, no lesions, without obvious abnormality.  Normal external eye and conjunctiva.  Normal TM's bilaterally.  Normal auditory canals and external ears. Normal external nose, mucus membranes and septum.  Normal pharynx. Neck supple with no masses, nodes, nodules or enlargement. Cardiovascular - regular rate and rhythm, S1, S2 normal, no murmur, click, rub or gallop Lungs - bilateral expiratory wheezes Abdomen - soft, non-tender; bowel sounds normal; no masses,  no organomegaly Extremities - no  joint deformities, effusion, or inflammation and no edema  Neurologic Examination: Mental Status: Alert, disoriented to current age and month, no acute distress.  Speech fluent without evidence of aphasia. Able to follow commands without difficulty. Cranial Nerves: Left homonymous hemianopsia noted III/IV/VI-Pupils were unequal, right greater than left; right pupil did not react to light. Extraocular movements were full and conjugate.    V/VII-no facial numbness; slight left lower facial weakness. VIII-normal. X-normal speech and symmetrical palatal movement. XI: trapezius strength/neck flexion strength normal bilaterally XII-midline tongue extension with normal strength. Motor: 5/5 bilaterally with normal tone and bulk Sensory: Normal throughout. Deep Tendon Reflexes: 1+ and symmetric. Plantars: Flexor bilaterally Cerebellar: Normal finger-to-nose testing. Carotid auscultation: Normal  Results for orders placed or performed during the hospital encounter of 06/01/15 (from the past 48 hour(s))  Glucose, capillary     Status: Abnormal   Collection Time: 06/02/15 11:38 PM  Result Value Ref Range   Glucose-Capillary 232 (H) 65 - 99 mg/dL   Comment 1 Notify RN   Procalcitonin     Status: None   Collection  Time: 06/03/15  2:22 AM  Result Value Ref Range   Procalcitonin 1.35 ng/mL    Comment:        Interpretation: PCT > 0.5 ng/mL and <= 2 ng/mL: Systemic infection (sepsis) is possible, but other conditions are known to elevate PCT as well. (NOTE)         ICU PCT Algorithm               Non ICU PCT Algorithm    ----------------------------     ------------------------------         PCT < 0.25 ng/mL                 PCT < 0.1 ng/mL     Stopping of antibiotics            Stopping of antibiotics       strongly encouraged.               strongly encouraged.    ----------------------------     ------------------------------       PCT level decrease by               PCT < 0.25 ng/mL        >= 80% from peak PCT       OR PCT 0.25 - 0.5 ng/mL          Stopping of antibiotics                                             encouraged.     Stopping of antibiotics           encouraged.    ----------------------------     ------------------------------       PCT level decrease by              PCT >= 0.25 ng/mL       < 80% from peak PCT        AND PCT >= 0.5 ng/mL             Continuing antibiotics                                              encouraged.       Continuing antibiotics            encouraged.    ----------------------------     ------------------------------     PCT level increase compared          PCT > 0.5 ng/mL         with peak PCT AND          PCT >= 0.5 ng/mL             Escalation of antibiotics                                          strongly encouraged.      Escalation of antibiotics        strongly encouraged.   Basic metabolic panel     Status: Abnormal   Collection Time: 06/03/15  2:22 AM  Result Value Ref Range   Sodium 142 135 -  145 mmol/L   Potassium 3.8 3.5 - 5.1 mmol/L   Chloride 109 101 - 111 mmol/L   CO2 20 (L) 22 - 32 mmol/L   Glucose, Bld 214 (H) 65 - 99 mg/dL   BUN 24 (H) 6 - 20 mg/dL   Creatinine, Ser 1.38 (H) 0.61 - 1.24 mg/dL   Calcium 8.6 (L) 8.9 - 10.3 mg/dL   GFR calc non Af Amer 46 (L) >60 mL/min   GFR calc Af Amer 53 (L) >60 mL/min    Comment: (NOTE) The eGFR has been calculated using the CKD EPI equation. This calculation has not been validated in all clinical situations. eGFR's persistently <60 mL/min signify possible Chronic Kidney Disease.    Anion gap 13 5 - 15  CBC     Status: Abnormal   Collection Time: 06/03/15  2:22 AM  Result Value Ref Range   WBC 16.8 (H) 4.0 - 10.5 K/uL   RBC 4.09 (L) 4.22 - 5.81 MIL/uL   Hemoglobin 10.6 (L) 13.0 - 17.0 g/dL   HCT 33.8 (L) 39.0 - 52.0 %   MCV 82.6 78.0 - 100.0 fL   MCH 25.9 (L) 26.0 - 34.0 pg   MCHC 31.4 30.0 - 36.0 g/dL   RDW 16.4 (H) 11.5 - 15.5 %   Platelets 207 150 -  400 K/uL  Glucose, capillary     Status: Abnormal   Collection Time: 06/03/15  3:29 AM  Result Value Ref Range   Glucose-Capillary 208 (H) 65 - 99 mg/dL   Comment 1 Notify RN   Glucose, capillary     Status: Abnormal   Collection Time: 06/03/15  7:25 AM  Result Value Ref Range   Glucose-Capillary 238 (H) 65 - 99 mg/dL  Glucose, capillary     Status: Abnormal   Collection Time: 06/03/15 12:07 PM  Result Value Ref Range   Glucose-Capillary 130 (H) 65 - 99 mg/dL  Glucose, capillary     Status: Abnormal   Collection Time: 06/03/15  3:05 PM  Result Value Ref Range   Glucose-Capillary 152 (H) 65 - 99 mg/dL  Glucose, capillary     Status: Abnormal   Collection Time: 06/03/15 10:12 PM  Result Value Ref Range   Glucose-Capillary 163 (H) 65 - 99 mg/dL  Glucose, capillary     Status: Abnormal   Collection Time: 06/03/15 11:50 PM  Result Value Ref Range   Glucose-Capillary 159 (H) 65 - 99 mg/dL  Glucose, capillary     Status: Abnormal   Collection Time: 06/04/15  5:07 AM  Result Value Ref Range   Glucose-Capillary 140 (H) 65 - 99 mg/dL  Glucose, capillary     Status: None   Collection Time: 06/04/15  8:04 AM  Result Value Ref Range   Glucose-Capillary 99 65 - 99 mg/dL  Glucose, capillary     Status: Abnormal   Collection Time: 06/04/15 11:12 AM  Result Value Ref Range   Glucose-Capillary 218 (H) 65 - 99 mg/dL  Glucose, capillary     Status: Abnormal   Collection Time: 06/04/15  4:23 PM  Result Value Ref Range   Glucose-Capillary 179 (H) 65 - 99 mg/dL   Mr Brain Wo Contrast  06/04/2015  CLINICAL DATA:  Stroke.  Hypertension diabetes COPD EXAM: MRI HEAD WITHOUT CONTRAST TECHNIQUE: Multiplanar, multiecho pulse sequences of the brain and surrounding structures were obtained without intravenous contrast. COMPARISON:  None. FINDINGS: Acute infarct in the right corona radiata white matter. Acute infarct extends into the right parietal cortex  and right posterior temporal lobe. Acute infarct  in the right occipital lobe . Small area of acute infarct in the left parietal periventricular white matter. Moderate to advanced atrophy. Mild chronic microvascular ischemic change in the white matter. Brainstem and cerebellum intact. Negative for intracranial hemorrhage. Negative for mass or edema.  No shift of the midline structures. Paranasal sinuses clear. Normal orbit. Pituitary normal in size. Skullbase intact. IMPRESSION: Acute infarcts in the right parietal lobe. Small areas of acute infarct in the right occipital lobe, right posterior temporal lobe, and left parietal white matter. Advanced atrophy. Electronically Signed   By: Franchot Gallo M.D.   On: 06/04/2015 19:52   Dg Chest Port 1 View  06/03/2015  CLINICAL DATA:  Respiratory failure with shortness of breath EXAM: PORTABLE CHEST 1 VIEW COMPARISON:  Yesterday FINDINGS: Tracheal and esophageal extubation. On the first image there is lower lung volumes with streaky opacity, but transient based on subsequent x-ray. There is suggestion of a hiatal hernia based on nasogastric tube positioning yesterday. IMPRESSION: 1. Stable aeration after extubation. 2. Persistent dense opacity behind the heart that correlates with history of pneumonia. Based on nasogastric tube positioning yesterday there could also be a hiatal hernia or dilated esophagus. Electronically Signed   By: Monte Fantasia M.D.   On: 06/03/2015 07:24    Assessment: 82 y.o. male with multiple risk factors for stroke as well as previous stroke, with new acute ischemic infarctions involving right parietal, temporal and occipital lobes as well as small focus of infarction involving left parietal lobe. Pattern of strokes is suggestive of possible watershed phenomena.  Stroke Risk Factors - diabetes mellitus, hyperlipidemia and hypertension  Plan: 1. HgbA1c, fasting lipid panel 2. MRA  of the brain without contrast 3. PT consult, OT consult 4. Echocardiogram 5. Carotid dopplers - may  not be necessary as patient had a recent carotid Doppler study as an outpatient 6. Prophylactic therapy-Antiplatelet med: Aspirin 7. Risk factor modification 8. Telemetry monitoring  C.R. Nicole Kindred, MD [facility] [phone]  06/04/2015, 9:24 PM

## 2015-06-04 NOTE — Progress Notes (Signed)
Physical therapist called this RN stating that patient was leaning to his left side. Per wife, patient has not been leaning at home; she states that the leaning began when he was intubated after admission. Rapid response notified. MD notified and is at bedside. Order for MRI given. Will continue to monitor.

## 2015-06-04 NOTE — Progress Notes (Signed)
Inpatient Rehabilitation  Per PT request, patient was screened by Gunnar Fusi for appropriateness for an Inpatient Acute Rehab consult.  Note that MRI is pending.  At this time we are recommending an Inpatient Rehab consult if MRI confirms a neuro diagnosis.  Please order if appropriate.   Carmelia Roller., CCC/SLP Admission Coordinator  [place]  Cell [phone]

## 2015-06-04 NOTE — Progress Notes (Signed)
Nutrition Follow-up  INTERVENTION:  Monitor PO intake for adequacy   NUTRITION DIAGNOSIS:   Inadequate oral intake related to inability to eat as evidenced by NPO status.  Resolved  GOAL:   Patient will meet greater than or equal to 90% of their needs  Progressing  MONITOR:   PO intake, Labs, Weight trends, Skin, I & O's  REASON FOR ASSESSMENT:   Consult Enteral/tube feeding initiation and management  ASSESSMENT:   82 year old male past medical history significant for Gold 2 COPD, diabetes, stroke, moderate dementia with behavioral disturbance, and hypertension. Brought in by EMS on 5/8, intubated.   Pt self extubated early morning on 5/10 and diet was advanced later that afternoon. Pt reports having a very good appetite and he denies any difficulty swallowing. He is eating 100% of meals. Per pt's wife, pt usually weighs 170 lbs. Weight is down 4 lbs from yesterday- ? Accuracy.   Labs: glucose ranging 99 to 218 mg/dL, low calcium, low hemoglobin  Diet Order:  Diet Carb Modified Fluid consistency:: Thin; Room service appropriate?: Yes  Skin:  Reviewed, no issues  Last BM:  5/8  Height:   Ht Readings from Last 1 Encounters:  06/03/15 5\' 9"  (1.753 m)    Weight:   Wt Readings from Last 1 Encounters:  06/04/15 166 lb 14.4 oz (75.705 kg)    Ideal Body Weight:  74.1 kg  BMI:  Body mass index is 24.64 kg/(m^2).  Estimated Nutritional Needs:   Kcal:  1850-2100  Protein:  105-120 grams  Fluid:  1.9-2.1 L/day  EDUCATION NEEDS:   No education needs identified at this time  Keys, LDN Inpatient Clinical Dietitian Pager: [phone] After Hours Pager: [phone]

## 2015-06-04 NOTE — Evaluation (Addendum)
Physical Therapy Evaluation Patient Details Name: Chad [name] MRN: [id number] DOB: Oct 08, 1929 Today's Date: 06/04/2015   History of Present Illness   5/8 he again presented to the pulmonary clinic with worsening shortness of breath. He was felt to be suffering from COPD exacerbation. As the day progressed he became markedly more dyspneic prompting him to call EMS. Upon their arrival he was found to be unresponsive requirin airway to be placed for ventilation. He was promptly intubated on arrival to the emergency department. He self-extubated 5/10 early a.m. PMHx- COPD, diabetes, stroke, moderate dementia with behavioral disturbance, and hypertension.     Clinical Impression  Pt admitted with above diagnosis. During session, noted to have Lt sided deficits (see details below). Sharmon Leyden, RN in to assess pt and called Rapid Response (not Code stroke as pt's last known well was prior to intubation 5/8). RN notified MD and as PT leaving MD arrived to assess pt. MRI brain currently pending.  Pt currently with functional limitations due to the deficits listed below (see PT Problem List).  Pt will benefit from skilled PT to increase their independence and safety with mobility to allow discharge to the venue listed below. Patient's wife is able to care for patient 24/7 (as she's been doing due to dementia). He has had a significant functional decline and needs to be at lease min assist level for wife to be able to manage at home.     Follow Up Recommendations CIR    Equipment Recommendations  Other (comment) (TBA as progresses)    Recommendations for Other Services Rehab consult;OT consult     Precautions / Restrictions Precautions Precautions: Fall      Mobility  Bed Mobility Overal bed mobility: Needs Assistance Bed Mobility: Rolling;Supine to Sit;Sit to Supine Rolling: Supervision   Supine to sit: Min guard Sit to supine: Min assist   General bed mobility comments: HOB flat; rail for  rolling (easily to his left, very slow with max cues to his rt; decr coordination LUE)  Transfers Overall transfer level: Needs assistance Equipment used: Rolling walker (2 wheeled) Transfers: Sit to/from Stand Sit to Stand: Mod assist         General transfer comment: comes to stand with strong left lean; x 2  Ambulation/Gait Ambulation/Gait assistance: Mod assist Ambulation Distance (Feet): 1 Feet Assistive device: Rolling walker (2 wheeled) Gait Pattern/deviations: Step-to pattern     General Gait Details: side stepped x 4 towards HOB; very small steps, assist to weight shift  Stairs            Wheelchair Mobility    Modified Rankin (Stroke Patients Only) Modified Rankin (Stroke Patients Only) Pre-Morbid Rankin Score: Moderately severe disability (supervision due to dementia) Modified Rankin: Moderately severe disability     Balance Overall balance assessment: Needs assistance;History of Falls Sitting-balance support: Single extremity supported;Feet supported Sitting balance-Leahy Scale: Zero Sitting balance - Comments: leans to his left; cannot correct; can reach to his rt with targets presented, but immediately drifts back to left lean Postural control: Left lateral lean Standing balance support: Bilateral upper extremity supported Standing balance-Leahy Scale: Zero Standing balance comment: as in sitting                             Pertinent Vitals/Pain SaO2 90-92% throughout (on room air) HR 92-97  Pain Assessment: No/denies pain    Home Living Family/patient expects to be discharged to:: Private residence Living Arrangements: Spouse/significant other  Available Help at Discharge: Family;Available 24 hours/day Type of Home: House Home Access: Stairs to enter   [employer] of Steps: 1 Home Layout: Multi-level Home Equipment: Cane - single point;Walker - 2 wheels;Bedside commode (BSC over toilet)      Prior Function Level  of Independence: Independent with assistive device(s)         Comments: uses cane sometimes; 2 falls at night since Feb     Hand Dominance   Dominant Hand: Right    Extremity/Trunk Assessment   Upper Extremity Assessment: Generalized weakness;LUE deficits/detail       LUE Deficits / Details: Noted decreased ability to hold RW (or find handle); in supine, he could not hold LUE at 90 shoulder flexion (drifts downward); when he spontaneously tries to use LUE (place hands behind his head , reach for rails over his head--his RUE moves briskly and LUE drags far behind and ultimately does not achieve target)   Lower Extremity Assessment: Generalized weakness (no drift; ankle DF, knee extension 5/5)      Cervical / Trunk Assessment: Other exceptions  Communication   Communication: HOH  Cognition Arousal/Alertness: Awake/alert Behavior During Therapy: Flat affect Overall Cognitive Status: Impaired/Different from baseline Area of Impairment: Attention;Following commands;Awareness;Problem solving   Current Attention Level: Sustained (difficulty attending with TV on; better when quiet) Memory: Decreased short-term memory (h/o dementia) Following Commands: Follows one step commands inconsistently;Follows one step commands with increased time   Awareness: Intellectual (when asked can identify he's leaning Lt, but not correct) Problem Solving: Slow processing;Decreased initiation;Difficulty sequencing;Requires verbal cues;Requires tactile cues      General Comments General comments (skin integrity, edema, etc.): Wife present and reported he did not lean to his Left or have left-sided weakness or decr coordination prior to admission. Reports while intubated, he favored going to the left side of bed and when up in chair 5/10 he was leaning left and noticed LUE drawing into flexor pattern (she demonstrated) with attempts to use his LUE    Exercises        Assessment/Plan    PT  Assessment Patient needs continued PT services  PT Diagnosis Hemiplegia non-dominant side;Difficulty walking;Altered mental status   PT Problem List Decreased strength;Decreased activity tolerance;Decreased balance;Decreased mobility;Decreased cognition;Decreased knowledge of use of DME;Decreased safety awareness  PT Treatment Interventions DME instruction;Gait training;Stair training;Functional mobility training;Therapeutic activities;Balance training;Neuromuscular re-education;Cognitive remediation;Patient/family education   PT Goals (Current goals can be found in the Care Plan section) Acute Rehab PT Goals Patient Stated Goal: wants to walk PT Goal Formulation: With patient/family Time For Goal Achievement: 06/11/15 Potential to Achieve Goals: Good    Frequency Min 4X/week   Barriers to discharge        Co-evaluation               End of Session Equipment Utilized During Treatment: Gait belt Activity Tolerance: Treatment limited secondary to medical complications (Comment) (new Lt sided deficits) Patient left: in bed;with call bell/phone within reach;with bed alarm set;with nursing/sitter in room;with family/visitor present Nurse Communication: Mobility status;Other (comment) (Lt sided changes)         Time: [id number] PT Time Calculation (min) (ACUTE ONLY): 51 min   Charges:   PT Evaluation $PT Eval Moderate Complexity: 1 Procedure PT Treatments $Therapeutic Activity: 23-37 mins   PT G Codes:        Naijah Lacek Jun 10, 2015, 11:39 AM Pager [phone]

## 2015-06-04 NOTE — Progress Notes (Signed)
PROGRESS NOTE    Chad [name]  [id number] DOB: 1929-02-04 DOA: 06/01/2015 PCP: Hoyt Koch, MD  Outpatient Specialists:   Brief Narrative: 82 year old male past medical history significant for COPD, diabetes, stroke, moderate dementia with behavioral disturbance, and hypertension. Patient was recently admitted to [facility] in February 2017 for sepsis secondary to a left upper lobe pneumonia. He was treated with broad-spectrum antibiotics and IV fluids for hypotension and eventually discharged home. He was recently seen 5/1 the pulmonary clinic for routine follow-up at which time he felt as though his breathing was doing pretty well, however, 5/8 he again presented to the pulmonary clinic with worsening shortness of breath. He was felt to be suffering from COPD exacerbation and was prescribed Augmentin and prednisone taper. As the day progressed he became markedly more dyspneic prompting him to call EMS. Upon their arrival he was found to be unresponsive requiring [place] airway to be placed for ventilation. He was given Solu-Medrol, albuterol, and 2 g of magnesium by EMS personnel and was promptly intubated on arrival to the emergency department. ABG concerning for profound respiratory acidosis (pH 6.9 with CO2 > 100). Staff also reportedly suctioning copious amounts of food like secretions from ET tube raising concern for aspiration. PCCM asked to value the patient for admission.  Assessment & Plan:   Principal Problem:   Acute hypercapnic respiratory failure (HCC) Active Problems:   COPD exacerbation (HCC)   Acute encephalopathy   HCAP (healthcare-associated pneumonia)   COPD with exacerbation ([place])  - Acute on chronic hypercarbic respiratory failure secondary to COPD exacerbation: Seems to have improved significantly. Continue current regimen. Likely DC home in 1-2 days. Assess need for home oxygen prior to discharge. - Probable HCAP/ LLL atelectasis Continue IV antibiotics.  Procalcitonin level noted. Mild elevation in WBC also noted. Patient looks stable.[place] for aspiration pneumonitis: See above. Swallowing evaluation as patient's mental status has improved significantly. -HTN, HLD - Historical. Optimize. - Elevated trop -demand ischemia versus prognostic significance indicating severity of illness.  -  AKI versus AKI on CKD - Improving. -  Acute metabolic encephalopathy - Improving. Baseline dementia noted.   FAMILY -wife   DVT prophylaxis:  Heparin  Family Communication: Wife updated today. Disposition Plan: Home in 1-2 days. DC foley's catheter.  Consultants:   ICU transfer.  Procedures: Intubation and self extubation. Antimicrobials: IV Zosyn.  Subjective: No new complaints. No fever or chills. No SOB or chest pain.  Objective: Filed Vitals:   06/03/15 2121 06/03/15 2127 06/03/15 2346 06/04/15 0452  BP: 146/78  152/86 151/75  Pulse: 75  77 70  Temp: 98.4 F (36.9 C)  98.4 F (36.9 C) 98.6 F (37 C)  TempSrc: Oral  Oral Oral  Resp: 18  18 18   Height:      Weight:    75.705 kg (166 lb 14.4 oz)  SpO2: 94% 95% 94% 97%    Intake/Output Summary (Last 24 hours) at 06/04/15 0942 Last data filed at 06/04/15 0600  Gross per 24 hour  Intake 1430.83 ml  Output   1465 ml  Net -34.17 ml   Filed Weights   06/03/15 0114 06/03/15 1819 06/04/15 0452  Weight: 78.4 kg (172 lb 13.5 oz) 77.293 kg (170 lb 6.4 oz) 75.705 kg (166 lb 14.4 oz)    Examination:  General exam: Not in distress.  Respiratory system: Good air entry. Cardiovascular system: S1 & S2. Gastrointestinal system: Abdomen is nondistended, soft and nontender.   Central nervous system: Awake  and Alert. Moves all limbs. Extremities: No edema.  Data Reviewed: I have personally reviewed following labs and imaging studies  CBC:  Recent Labs Lab 06/01/15 1844 06/01/15 1851 06/02/15 0200 06/03/15 0222  WBC 18.9*  --  13.3* 16.8*  NEUTROABS 6.0  --   --   --   HGB  12.9* 15.0 10.9* 10.6*  HCT 42.3 44.0 35.6* 33.8*  MCV 89.2  --  84.0 82.6  PLT 298  --  190 [id number]   Basic Metabolic Panel:  Recent Labs Lab 06/01/15 1844 06/01/15 1851 06/02/15 0200 06/03/15 0222  NA 135 137 136 142  K 5.0 5.1 4.3 3.8  CL 101 106 102 109  CO2 17*  --  16* 20*  GLUCOSE 306* 292* 346* 214*  BUN 22* 29* 25* 24*  CREATININE 1.70* 1.40* 1.49* 1.38*  CALCIUM 10.1  --  8.5* 8.6*  MG 3.7*  --  2.0  --   PHOS 9.0*  --  1.6*  --    GFR: Estimated Creatinine Clearance: 40.6 mL/min (by C-G formula based on Cr of 1.38). Liver Function Tests:  Recent Labs Lab 06/01/15 1844  AST 33  ALT 22  ALKPHOS 88  BILITOT 0.4  PROT 7.3  ALBUMIN 4.1    Recent Labs Lab 06/01/15 2344  LIPASE 26   No results for input(s): AMMONIA in the last 168 hours. Coagulation Profile:  Recent Labs Lab 06/01/15 1844  INR 1.21   Cardiac Enzymes:  Recent Labs Lab 06/01/15 1844 06/01/15 2119 06/02/15 0200 06/02/15 0733 06/02/15 1040  TROPONINI <0.03 0.16* 0.33* 0.67* 0.63*   BNP (last 3 results) No results for input(s): PROBNP in the last 8760 hours. HbA1C: No results for input(s): HGBA1C in the last 72 hours. CBG:  Recent Labs Lab 06/03/15 1505 06/03/15 2212 06/03/15 2350 06/04/15 0507 06/04/15 0804  GLUCAP 152* 163* 159* 140* 99   Lipid Profile: No results for input(s): CHOL, HDL, LDLCALC, TRIG, CHOLHDL, LDLDIRECT in the last 72 hours. Thyroid Function Tests: No results for input(s): TSH, T4TOTAL, FREET4, T3FREE, THYROIDAB in the last 72 hours. Anemia Panel: No results for input(s): VITAMINB12, FOLATE, FERRITIN, TIBC, IRON, RETICCTPCT in the last 72 hours. Urine analysis:    Component Value Date/Time   COLORURINE YELLOW 06/01/2015 1923   APPEARANCEUR CLOUDY* 06/01/2015 1923   LABSPEC 1.018 06/01/2015 1923   PHURINE 6.0 06/01/2015 1923   GLUCOSEU 500* 06/01/2015 1923   HGBUR SMALL* 06/01/2015 [place] NEGATIVE 06/01/2015 [place]  NEGATIVE 06/01/2015 1923   PROTEINUR 100* 06/01/2015 1923   NITRITE NEGATIVE 06/01/2015 1923   LEUKOCYTESUR NEGATIVE 06/01/2015 1923   Sepsis Labs: @LABRCNTIP (procalcitonin:4,lacticidven:4)  ) Recent Results (from the past 240 hour(s))  Blood Culture (routine x 2)     Status: None (Preliminary result)   Collection Time: 06/01/15  6:35 PM  Result Value Ref Range Status   Specimen Description BLOOD RIGHT ANTECUBITAL  Final   Special Requests BOTTLES DRAWN AEROBIC ONLY 5CC  Final   Culture NO GROWTH 2 DAYS  Final   Report Status PENDING  Incomplete  Blood Culture (routine x 2)     Status: None (Preliminary result)   Collection Time: 06/01/15  6:40 PM  Result Value Ref Range Status   Specimen Description BLOOD LEFT ANTECUBITAL  Final   Special Requests BOTTLES DRAWN AEROBIC ONLY 5CC  Final   Culture NO GROWTH 2 DAYS  Final   Report Status PENDING  Incomplete  Urine culture  Status: None   Collection Time: 06/01/15  7:10 PM  Result Value Ref Range Status   Specimen Description URINE, CATHETERIZED  Final   Special Requests NONE  Final   Culture NO GROWTH 2 DAYS  Final   Report Status 06/03/2015 FINAL  Final  MRSA PCR Screening     Status: None   Collection Time: 06/01/15  9:49 PM  Result Value Ref Range Status   MRSA by PCR NEGATIVE NEGATIVE Final    Comment:        The GeneXpert MRSA Assay (FDA approved for NASAL specimens only), is one component of a comprehensive MRSA colonization surveillance program. It is not intended to diagnose MRSA infection nor to guide or monitor treatment for MRSA infections.   Culture, bal-quantitative     Status: None (Preliminary result)   Collection Time: 06/02/15  2:20 PM  Result Value Ref Range Status   Specimen Description BRONCHIAL ALVEOLAR LAVAGE  Final   Special Requests NONE  Final   Gram Stain   Final    ABUNDANT WBC PRESENT, PREDOMINANTLY PMN NO SQUAMOUS EPITHELIAL CELLS SEEN FEW YEAST Performed at [facility]     Culture   Final    Culture reincubated for better growth Performed at [facility]    Report Status PENDING  Incomplete         Radiology Studies: Dg Chest Port 1 View  06/03/2015  CLINICAL DATA:  Respiratory failure with shortness of breath EXAM: PORTABLE CHEST 1 VIEW COMPARISON:  Yesterday FINDINGS: Tracheal and esophageal extubation. On the first image there is lower lung volumes with streaky opacity, but transient based on subsequent x-ray. There is suggestion of a hiatal hernia based on nasogastric tube positioning yesterday. IMPRESSION: 1. Stable aeration after extubation. 2. Persistent dense opacity behind the heart that correlates with history of pneumonia. Based on nasogastric tube positioning yesterday there could also be a hiatal hernia or dilated esophagus. Electronically Signed   By: Monte Fantasia M.D.   On: 06/03/2015 07:24        Scheduled Meds: . amLODipine  5 mg Oral Daily  . aspirin EC  81 mg Oral Daily  . brimonidine  1 drop Left Eye BID  . cholecalciferol  1,000 Units Oral Daily  . donepezil  10 mg Oral QHS  . heparin  5,000 Units Subcutaneous Q8H  . insulin aspart  2-6 Units Subcutaneous Q4H  . insulin glargine  10 Units Subcutaneous Daily  . ipratropium-albuterol  3 mL Nebulization TID  . losartan  100 mg Oral Daily  . methylPREDNISolone (SOLU-MEDROL) injection  40 mg Intravenous Q24H  . piperacillin-tazobactam (ZOSYN)  IV  3.375 g Intravenous Q8H  . vitamin B-12  1,000 mcg Oral Daily   Continuous Infusions: . sodium chloride 50 mL/hr at 06/03/15 1108     LOS: 3 days    Time spent: [place]    Bonnell Public, MD [facility] Pager [phone].  If 7PM-7AM, please contact night-coverage [email or web address] Password [facility] 06/04/2015, 9:42 AM

## 2015-06-04 NOTE — Progress Notes (Signed)
Pharmacy Antibiotic Note  Chad [name] is a 82 y.o. male admitted on 06/01/2015 with pneumonia.  Pharmacy has been consulted for Zosyn dosing.  Plan: Continue Zosyn 3.375g IV q8h- 4hr infusion.  Pharmacy will sign-off as renal function improving and no dose change necessary.   Height: 5\' 9"  (175.3 cm) Weight: 166 lb 14.4 oz (75.705 kg) (bedscale) IBW/kg (Calculated) : 70.7  Temp (24hrs), Avg:98.5 F (36.9 C), Min:98.4 F (36.9 C), Max:98.6 F (37 C)   Recent Labs Lab 06/01/15 1844 06/01/15 1847 06/01/15 1851 06/01/15 2119 06/01/15 2344 06/02/15 0200 06/03/15 0222  WBC 18.9*  --   --   --   --  13.3* 16.8*  CREATININE 1.70*  --  1.40*  --   --  1.49* 1.38*  LATICACIDVEN  --  8.88*  --  5.7* 5.7*  --   --     Estimated Creatinine Clearance: 40.6 mL/min (by C-G formula based on Cr of 1.38).    Allergies  Allergen Reactions  . Avelox [Moxifloxacin Hcl In Nacl] Anaphylaxis  . Timoptic [Timolol Maleate] Other (See Comments)    Congestion  . Sulfa Antibiotics Rash    Antimicrobials this admission: Vanc 5/8 >>5/10 Zosyn 5/8 >> Azith 5/8 >> 5/8 CTX 5/8 >> 8/5 Augmentin PTA  Dose adjustments this admission:  Microbiology results: 5/8 BCx x2 - NGTD 5/8 UCx - pending 5/9 BAL - re-incubated  Thank you for allowing pharmacy to be a part of this patient's care.  Brain Hilts 06/04/2015 2:31 PM

## 2015-06-05 ENCOUNTER — Inpatient Hospital Stay (HOSPITAL_COMMUNITY): Payer: Medicare HMO

## 2015-06-05 ENCOUNTER — Encounter (HOSPITAL_COMMUNITY): Payer: Self-pay | Admitting: Radiology

## 2015-06-05 DIAGNOSIS — E785 Hyperlipidemia, unspecified: Secondary | ICD-10-CM

## 2015-06-05 DIAGNOSIS — G8194 Hemiplegia, unspecified affecting left nondominant side: Secondary | ICD-10-CM

## 2015-06-05 DIAGNOSIS — I639 Cerebral infarction, unspecified: Secondary | ICD-10-CM

## 2015-06-05 DIAGNOSIS — I6330 Cerebral infarction due to thrombosis of unspecified cerebral artery: Secondary | ICD-10-CM | POA: Insufficient documentation

## 2015-06-05 DIAGNOSIS — I63413 Cerebral infarction due to embolism of bilateral middle cerebral arteries: Secondary | ICD-10-CM | POA: Insufficient documentation

## 2015-06-05 DIAGNOSIS — I6789 Other cerebrovascular disease: Secondary | ICD-10-CM

## 2015-06-05 DIAGNOSIS — I6523 Occlusion and stenosis of bilateral carotid arteries: Secondary | ICD-10-CM

## 2015-06-05 LAB — CBC WITH DIFFERENTIAL/PLATELET
Basophils Absolute: 0 10*3/uL (ref 0.0–0.1)
Basophils Relative: 0 %
Eosinophils Absolute: 0 10*3/uL (ref 0.0–0.7)
Eosinophils Relative: 0 %
HCT: 34.2 % — ABNORMAL LOW (ref 39.0–52.0)
Hemoglobin: 10.8 g/dL — ABNORMAL LOW (ref 13.0–17.0)
Lymphocytes Relative: 24 %
Lymphs Abs: 2.1 10*3/uL (ref 0.7–4.0)
MCH: 26.5 pg (ref 26.0–34.0)
MCHC: 31.6 g/dL (ref 30.0–36.0)
MCV: 83.8 fL (ref 78.0–100.0)
Monocytes Absolute: 0.8 10*3/uL (ref 0.1–1.0)
Monocytes Relative: 10 %
Neutro Abs: 5.8 10*3/uL (ref 1.7–7.7)
Neutrophils Relative %: 67 %
Platelets: 199 10*3/uL (ref 150–400)
RBC: 4.08 MIL/uL — ABNORMAL LOW (ref 4.22–5.81)
RDW: 16.2 % — ABNORMAL HIGH (ref 11.5–15.5)
WBC: 8.8 10*3/uL (ref 4.0–10.5)

## 2015-06-05 LAB — CBC AND DIFFERENTIAL: WBC: 8.8 10*3/mL

## 2015-06-05 LAB — GLUCOSE, CAPILLARY
GLUCOSE-CAPILLARY: 147 mg/dL — AB (ref 65–99)
GLUCOSE-CAPILLARY: 318 mg/dL — AB (ref 65–99)
Glucose-Capillary: 313 mg/dL — ABNORMAL HIGH (ref 65–99)
Glucose-Capillary: 161 mg/dL — ABNORMAL HIGH (ref 65–99)

## 2015-06-05 LAB — LIPID PANEL
CHOL/HDL RATIO: 3.5 ratio
CHOLESTEROL: 162 mg/dL (ref 0–200)
HDL: 46 mg/dL (ref >40)
LDL CALC: 95 mg/dL (ref 0–99)
TRIGLYCERIDES: 106 mg/dL (ref <150)
VLDL: 21 mg/dL (ref 0–40)

## 2015-06-05 LAB — RENAL FUNCTION PANEL
Albumin: 3 g/dL — ABNORMAL LOW (ref 3.5–5.0)
Anion gap: 12 (ref 5–15)
BUN: 22 mg/dL — ABNORMAL HIGH (ref 6–20)
CO2: 25 mmol/L (ref 22–32)
Calcium: 9.1 mg/dL (ref 8.9–10.3)
Chloride: 105 mmol/L (ref 101–111)
Creatinine, Ser: 1.48 mg/dL — ABNORMAL HIGH (ref 0.61–1.24)
GFR calc Af Amer: 49 mL/min — ABNORMAL LOW (ref >60)
GFR calc non Af Amer: 42 mL/min — ABNORMAL LOW (ref >60)
Glucose, Bld: 133 mg/dL — ABNORMAL HIGH (ref 65–99)
Phosphorus: 4 mg/dL (ref 2.5–4.6)
Potassium: 3.7 mmol/L (ref 3.5–5.1)
Sodium: 142 mmol/L (ref 135–145)

## 2015-06-05 LAB — TSH: TSH: 1.326 u[IU]/mL (ref 0.350–4.500)

## 2015-06-05 LAB — ECHOCARDIOGRAM COMPLETE
Height: 69 in
WEIGHTICAEL: 2622.4 [oz_av]

## 2015-06-05 LAB — VITAMIN B12: Vitamin B-12: 2099 pg/mL — ABNORMAL HIGH (ref 180–914)

## 2015-06-05 MED ORDER — SIMVASTATIN 20 MG PO TABS
20.0000 mg | ORAL_TABLET | Freq: Every day | ORAL | Status: DC
  Administered 2015-06-05 – 2015-06-08 (×4): 20 mg via ORAL
  Filled 2015-06-05 (×4): qty 1

## 2015-06-05 MED ORDER — INSULIN ASPART 100 UNIT/ML ~~LOC~~ SOLN
0.0000 [IU] | Freq: Three times a day (TID) | SUBCUTANEOUS | Status: DC
Start: 2015-06-05 — End: 2015-06-09
  Administered 2015-06-05: 7 [IU] via SUBCUTANEOUS
  Administered 2015-06-06: 5 [IU] via SUBCUTANEOUS
  Administered 2015-06-06: 1 [IU] via SUBCUTANEOUS
  Administered 2015-06-06 – 2015-06-07 (×2): 3 [IU] via SUBCUTANEOUS
  Administered 2015-06-07: 2 [IU] via SUBCUTANEOUS
  Administered 2015-06-07: 5 [IU] via SUBCUTANEOUS
  Administered 2015-06-08 (×2): 2 [IU] via SUBCUTANEOUS
  Administered 2015-06-08 – 2015-06-09 (×3): 1 [IU] via SUBCUTANEOUS

## 2015-06-05 MED ORDER — INSULIN ASPART 100 UNIT/ML ~~LOC~~ SOLN: 0.0000 [IU] | Freq: Every day | SUBCUTANEOUS | Status: DC

## 2015-06-05 MED ORDER — IOPAMIDOL (ISOVUE-370) INJECTION 76%
INTRAVENOUS | Status: AC
  Administered 2015-06-05: 50 mL
  Filled 2015-06-05: qty 50

## 2015-06-05 MED ORDER — GLUCERNA SHAKE PO LIQD
237.0000 mL | Freq: Two times a day (BID) | ORAL | Status: DC
  Administered 2015-06-06 – 2015-06-09 (×7): 237 mL via ORAL

## 2015-06-05 MED ORDER — ADULT MULTIVITAMIN W/MINERALS CH
1.0000 | ORAL_TABLET | Freq: Every day | ORAL | Status: DC
  Administered 2015-06-05 – 2015-06-09 (×5): 1 via ORAL
  Filled 2015-06-05 (×5): qty 1

## 2015-06-05 NOTE — Clinical Social Work Placement (Signed)
   CLINICAL SOCIAL WORK PLACEMENT  NOTE  Date:  06/05/2015  Patient Details  Name: Chad [name] MRN: [id number] Date of Birth: 01-07-1930  Clinical Social Work is seeking post-discharge placement for this patient at the [place] level of care (*CSW will initial, date and re-position this form in  chart as items are completed):  Yes   Patient/family provided with [place] Work Department's list of facilities offering this level of care within the geographic area requested by the patient (or if unable, by the patient's family).  Yes   Patient/family informed of their freedom to choose among providers that offer the needed level of care, that participate in Medicare, Medicaid or managed care program needed by the patient, have an available bed and are willing to accept the patient.  Yes   Patient/family informed of [facility]'s ownership interest in [facility] and [facility], as well as of the fact that they are under no obligation to receive care at these facilities.  PASRR submitted to EDS on       PASRR number received on 06/05/15     Existing PASRR number confirmed on       FL2 transmitted to all facilities in geographic area requested by pt/family on       FL2 transmitted to all facilities within larger geographic area on       Patient informed that his/her managed care company has contracts with or will negotiate with certain facilities, including the following:            Patient/family informed of bed offers received.  Patient chooses bed at       Physician recommends and patient chooses bed at      Patient to be transferred to   on  .  Patient to be transferred to facility by       Patient family notified on   of transfer.  Name of family member notified:        PHYSICIAN       Additional Comment:    _______________________________________________ Candie Chroman, LCSW 06/05/2015, 12:09 PM

## 2015-06-05 NOTE — Progress Notes (Signed)
Physical Therapy Treatment Patient Details Name: Chad [name] MRN: [id number] DOB: Aug 19, 1929 Today's Date: 06/05/2015    History of Present Illness  5/8 he again presented to the pulmonary clinic with worsening shortness of breath. He was felt to be suffering from COPD exacerbation. As the day progressed he became markedly more dyspneic prompting him to call EMS. Upon their arrival he was found to be unresponsive requirin airway to be placed for ventilation. He was promptly intubated on arrival to the emergency department. He self-extubated 5/10 early a.m. Noted Lt lean, LUE changes and 5/11 MRI brain showed Rt parietal, temporal, occipital and Lt parietal CVAs  PMHx- COPD, diabetes, stroke, moderate dementia with behavioral disturbance, and hypertension.     PT Comments    Patient noted + for multiple acute infarcts/CVAs. Continues with Lt inattention and poor balance. Difficult to get pt to adequately weight shift over his Rt foot to allow him to advance/step his LLE. Feel with 2 person assist (while pt has IV pole) he could be ambulating short distances.   Follow Up Recommendations  CIR     Equipment Recommendations  Other (comment) (TBA as progresses)    Recommendations for Other Services Rehab consult;OT consult     Precautions / Restrictions Precautions Precautions: Fall Precaution Comments: ?inattention Lt side    Mobility  Bed Mobility Overal bed mobility: Needs Assistance Bed Mobility: Supine to Sit     Supine to sit: Min guard     General bed mobility comments: HOB flat, no rail; incr effort and INCR time to achieve sitting EOB with feet on floor  Transfers Overall transfer level: Needs assistance Equipment used: None Transfers: Sit to/from [facility] Sit to Stand: Min assist Stand pivot transfers: Min assist       General transfer comment: lean to left is less severe  Ambulation/Gait             General Gait Details: pre-gait  with weight-shifting and initiating stepping; pt resists full weight shift over RLE to allow LLe to advance/step; standing pre-gait x 3 trials with seated rest breaks for 12 minutes   Stairs            Wheelchair Mobility    Modified Rankin (Stroke Patients Only) Modified Rankin (Stroke Patients Only) Pre-Morbid Rankin Score: Moderately severe disability (supervision due to dementia) Modified Rankin: Severe disability     Balance Overall balance assessment: Needs assistance Sitting-balance support: No upper extremity supported;Feet supported Sitting balance-Leahy Scale: Zero Sitting balance - Comments: very slow drift/lean to his left with no correction until cued by PT (twice his head was close to touching bed rail with no correction by pt; once cued, he is able to correct to midline, but only maintains ,5 sec Postural control: Left lateral lean;Posterior lean Standing balance support: No upper extremity supported Standing balance-Leahy Scale: Zero                      Cognition Arousal/Alertness: Awake/alert Behavior During Therapy: Flat affect Overall Cognitive Status: Impaired/Different from baseline Area of Impairment: Attention;Following commands;Awareness;Problem solving;Safety/judgement   Current Attention Level: Sustained Memory: Decreased short-term memory (h/o dementia) Following Commands: Follows one step commands with increased time Safety/Judgement: Decreased awareness of deficits;Decreased awareness of safety Awareness: Intellectual Problem Solving: Slow processing;Decreased initiation;Difficulty sequencing;Requires verbal cues;Requires tactile cues      Exercises General Exercises - Lower Extremity Ankle Circles/Pumps: AROM;Strengthening;Left;5 reps Heel Slides: AROM;Strengthening;Left;5 reps Hip Flexion/Marching: AAROM;Both;5 reps;Standing (attempted with very little foot clearance  either Rt or Lt)    General Comments General comments (skin  integrity, edema, etc.): wife present      Pertinent Vitals/Pain Pain Assessment: No/denies pain    Home Living                      Prior Function            PT Goals (current goals can now be found in the care plan section) Acute Rehab PT Goals Patient Stated Goal: wants to walk Time For Goal Achievement: 06/11/15 Progress towards PT goals: Progressing toward goals    Frequency  Min 4X/week    PT Plan Current plan remains appropriate    Co-evaluation             End of Session Equipment Utilized During Treatment: Gait belt Activity Tolerance: Patient tolerated treatment well Patient left: with call bell/phone within reach;with nursing/sitter in room;with family/visitor present;in chair;with chair alarm set     Time: [id number] PT Time Calculation (min) (ACUTE ONLY): 31 min  Charges:  $Therapeutic Activity: 23-37 mins                    G Codes:      Elijio Staples 06-16-2015, 11:32 AM Pager [phone]

## 2015-06-05 NOTE — Progress Notes (Signed)
Nutrition Follow-up   INTERVENTION:  Provide Glucerna Shake po BID, each supplement provides 220 kcal and 10 grams of protein  Provide Multivitamin with minerals daily  NUTRITION DIAGNOSIS:   Unintentional weight loss related to acute illness, chronic illness as evidenced by percent weight loss.  ongoing  GOAL:   Patient will meet greater than or equal to 90% of their needs  Progressing  MONITOR:   PO intake, Labs, Weight trends, Skin, I & O's  REASON FOR ASSESSMENT:   Consult Enteral/tube feeding initiation and management  ASSESSMENT:   82 year old male past medical history significant for Gold 2 COPD, diabetes, stroke, moderate dementia with behavioral disturbance, and hypertension. Brought in by EMS on 5/8, intubated.   Pt continues to lose weight despite eating 100% of meals. Weight down to 163 lbs from 170 lbs since admission. Will provide Glucerna Shakes BID until weight stabilizes.   Labs: low hemoglobin  Diet Order:  Diet Carb Modified Fluid consistency:: Thin; Room service appropriate?: Yes  Skin:  Reviewed, no issues  Last BM:  5/11  Height:   Ht Readings from Last 1 Encounters:  06/03/15 5\' 9"  (1.753 m)    Weight:   Wt Readings from Last 1 Encounters:  06/05/15 163 lb 14.4 oz (74.345 kg)    Ideal Body Weight:  74.1 kg  BMI:  Body mass index is 24.19 kg/(m^2).  Estimated Nutritional Needs:   Kcal:  1850-2100  Protein:  105-120 grams  Fluid:  1.9-2.1 L/day  EDUCATION NEEDS:   No education needs identified at this time  [place], LDN Inpatient Clinical Dietitian Pager: [phone] After Hours Pager: [phone]

## 2015-06-05 NOTE — Consult Note (Signed)
Physical Medicine and Rehabilitation Consult Reason for Consult: Acute ischemic infarct right parietal, temporal and occipital lobe Referring Physician: [facility]   HPI: Chad [name] is a 82 y.o. right handed male with history of diabetes mellitus, COPD, prior CVA, moderate dementia maintained on Aricept with behavioral disturbance and hypertension. Per chart review patient lives with spouse. Multiple level home. Used a cane and walker prior to admission. Family has reported 2 falls since February 2017. Patient recently admitted February 2017 for sepsis secondary left upper lobe pneumonia completed broad-spectrum antibiotics discharged to home. Presented 06/01/2015 with increasing shortness of breath. Patient had recently been seen at the pulmonary clinic placed on a prednisone taper as well as Augmentin. Progressive shortness of breath became unresponsive placed on ventilator. He was given Solu-Medrol. Patient self extubated 06/03/2015. On 06/04/2015 noted increased lean to the left as well as left-sided weakness. MRI of the brain showed acute infarct right parietal lobe as well as right occipital posterior temporal lobes. Patient did not receive TPA. Neurology was consulted. Echocardiogram is pending. CT angiogram of head and neck are pending. Currently maintained on aspirin for CVA prophylaxis. Subcutaneous heparin for DVT prophylaxis. Tolerating a regular consistency diet.   Review of Systems  Unable to perform ROS: mental acuity  All other systems reviewed and are negative.  Past Medical History  Diagnosis Date  . Diabetes mellitus without complication ([place])   . COPD (chronic obstructive pulmonary disease) ([place])   . Stroke ([place])   . Hypertension   . Glaucoma   . Diverticulitis   . GERD (gastroesophageal reflux disease)   . Arthritis   . High cholesterol   . Renal disorder    Past Surgical History  Procedure Laterality Date  . Knee surgery    . Carotid artery angioplasty      . Shoulder surgery    . Basal cell carcinoma excision    . Eye surgery     History reviewed. No pertinent family history. Social History:  has no tobacco, alcohol, and drug history on file. Allergies:  Allergies  Allergen Reactions  . Avelox [Moxifloxacin Hcl In Nacl] Anaphylaxis  . Timoptic [Timolol Maleate] Other (See Comments)    Congestion  . Sulfa Antibiotics Rash   Medications Prior to Admission  Medication Sig Dispense Refill  . albuterol (PROVENTIL HFA;VENTOLIN HFA) 108 (90 Base) MCG/ACT inhaler Inhale 2 puffs into the lungs every 4 (four) hours as needed for wheezing or shortness of breath.    Marland Kitchen albuterol (PROVENTIL) (2.5 MG/3ML) 0.083% nebulizer solution Take 2.5 mg by nebulization every 4 (four) hours as needed for wheezing or shortness of breath.    Marland Kitchen amLODipine (NORVASC) 5 MG tablet Take 5 mg by mouth daily.    Marland Kitchen aspirin 81 MG tablet Take 81 mg by mouth daily.    . brimonidine (ALPHAGAN) 0.2 % ophthalmic solution Place 1 drop into the left eye 2 (two) times daily.    . budesonide-formoterol (SYMBICORT) 160-4.5 MCG/ACT inhaler Inhale 2 puffs into the lungs 2 (two) times daily.    . cholecalciferol (VITAMIN D) 1000 units tablet Take 1,000 Units by mouth daily.    . cloNIDine (CATAPRES) 0.2 MG tablet Take 0.1-0.2 mg by mouth See admin instructions. To take as needed id SYSTOLIC BP is over 0000000    . donepezil (ARICEPT) 10 MG tablet Take 10 mg by mouth at bedtime.    . fluticasone (FLONASE) 50 MCG/ACT nasal spray Place 2 sprays into both nostrils 2 (two) times  daily as needed for allergies.     Marland Kitchen losartan (COZAAR) 100 MG tablet Take 100 mg by mouth daily.    . metFORMIN (GLUCOPHAGE) 500 MG tablet Take 500 mg by mouth daily with breakfast.    . mirtazapine (REMERON) 15 MG tablet Take 15 mg by mouth at bedtime.     Marland Kitchen omeprazole (PRILOSEC) 20 MG capsule Take 20 mg by mouth daily.    . potassium chloride SA (K-DUR,KLOR-CON) 20 MEQ tablet Take 20 mEq by mouth daily.    . predniSONE  (DELTASONE) 10 MG tablet Take 10 mg by mouth daily with breakfast. Tapered dose    . promethazine (PHENERGAN) 25 MG tablet Take 12.5-25 mg by mouth every 6 (six) hours as needed for nausea or vomiting.    . valACYclovir (VALTREX) 500 MG tablet Take 500 mg by mouth 2 (two) times daily as needed. For [employer]    . vitamin B-12 (CYANOCOBALAMIN) 1000 MCG tablet Take 1,000 mcg by mouth daily.    . [DISCONTINUED] amoxicillin-clavulanate (AUGMENTIN) 875-125 MG tablet Take 1 tablet by mouth 2 (two) times daily.      Home: Home Living Family/patient expects to be discharged to:: Private residence Living Arrangements: Spouse/significant other Available Help at Discharge: Family, Available 24 hours/day Type of Home: House Home Access: Stairs to enter Technical brewer of Steps: 1 Home Layout: Multi-level Alternate Level Stairs-Number of Steps: 1 (into bathroom) Bathroom Shower/Tub: Chiropodist: Standard Bathroom Accessibility: Yes Home Equipment: Cane - single point, Environmental consultant - 2 wheels, Bedside commode (BSC over toilet)  Functional History: Prior Function Level of Independence: Independent with assistive device(s) Comments: uses cane sometimes; 2 falls at night since Feb Functional Status:  Mobility: Bed Mobility Overal bed mobility: Needs Assistance Bed Mobility: Rolling, Supine to Sit, Sit to Supine Rolling: Supervision Supine to sit: Min guard Sit to supine: Min assist General bed mobility comments: HOB flat; rail for rolling (easily to his left, very slow with max cues to his rt; decr coordination LUE) Transfers Overall transfer level: Needs assistance Equipment used: Rolling walker (2 wheeled) Transfers: Sit to/from Stand Sit to Stand: Mod assist General transfer comment: comes to stand with strong left lean; x 2 Ambulation/Gait Ambulation/Gait assistance: Mod assist Ambulation Distance (Feet): 1 Feet Assistive device: Rolling walker (2 wheeled) Gait  Pattern/deviations: Step-to pattern General Gait Details: side stepped x 4 towards HOB; very small steps, assist to weight shift    ADL:    Cognition: Cognition Overall Cognitive Status: Impaired/Different from baseline Orientation Level: Oriented to person, Oriented to place, Oriented to situation, Disoriented to time Cognition Arousal/Alertness: Awake/alert Behavior During Therapy: Flat affect Overall Cognitive Status: Impaired/Different from baseline Area of Impairment: Attention, Following commands, Awareness, Problem solving Current Attention Level: Sustained (difficulty attending with TV on; better when quiet) Memory: Decreased short-term memory (h/o dementia) Following Commands: Follows one step commands inconsistently, Follows one step commands with increased time Awareness: Intellectual (when asked can identify he's leaning Lt, but not correct) Problem Solving: Slow processing, Decreased initiation, Difficulty sequencing, Requires verbal cues, Requires tactile cues  Blood pressure 136/72, pulse 65, temperature 98 F (36.7 C), temperature source Oral, resp. rate 20, height 5\' 9"  (1.753 m), weight 74.345 kg (163 lb 14.4 oz), SpO2 98 %. Physical Exam  HENT:  Head: Normocephalic.  Eyes: EOM are normal.  Neck: Normal range of motion. Neck supple. No thyromegaly present.  Cardiovascular: Normal rate and regular rhythm.   Respiratory:  Patient with decreased inspiratory effort but clear to auscultation  GI: Soft. Bowel sounds are normal. He exhibits no distension.  Neurological: He is alert.  This oriented to current age and month. He did follow simple commands. Left sided weakness and left inattention.   Skin: Skin is warm and dry.  Psychiatric:  Pt is cooperative and pleasant    Results for orders placed or performed during the hospital encounter of 06/01/15 (from the past 24 hour(s))  Glucose, capillary     Status: Abnormal   Collection Time: 06/04/15 11:12 AM  Result  Value Ref Range   Glucose-Capillary 218 (H) 65 - 99 mg/dL  Glucose, capillary     Status: Abnormal   Collection Time: 06/04/15  4:23 PM  Result Value Ref Range   Glucose-Capillary 179 (H) 65 - 99 mg/dL  Glucose, capillary     Status: Abnormal   Collection Time: 06/04/15 10:07 PM  Result Value Ref Range   Glucose-Capillary 168 (H) 65 - 99 mg/dL  Renal function panel     Status: Abnormal   Collection Time: 06/05/15  4:41 AM  Result Value Ref Range   Sodium 142 135 - 145 mmol/L   Potassium 3.7 3.5 - 5.1 mmol/L   Chloride 105 101 - 111 mmol/L   CO2 25 22 - 32 mmol/L   Glucose, Bld 133 (H) 65 - 99 mg/dL   BUN 22 (H) 6 - 20 mg/dL   Creatinine, Ser 1.48 (H) 0.61 - 1.24 mg/dL   Calcium 9.1 8.9 - 10.3 mg/dL   Phosphorus 4.0 2.5 - 4.6 mg/dL   Albumin 3.0 (L) 3.5 - 5.0 g/dL   GFR calc non Af Amer 42 (L) >60 mL/min   GFR calc Af Amer 49 (L) >60 mL/min   Anion gap 12 5 - 15  CBC with Differential/Platelet     Status: Abnormal   Collection Time: 06/05/15  4:41 AM  Result Value Ref Range   WBC 8.8 4.0 - 10.5 K/uL   RBC 4.08 (L) 4.22 - 5.81 MIL/uL   Hemoglobin 10.8 (L) 13.0 - 17.0 g/dL   HCT 34.2 (L) 39.0 - 52.0 %   MCV 83.8 78.0 - 100.0 fL   MCH 26.5 26.0 - 34.0 pg   MCHC 31.6 30.0 - 36.0 g/dL   RDW 16.2 (H) 11.5 - 15.5 %   Platelets 199 150 - 400 K/uL   Neutrophils Relative % 67 %   Neutro Abs 5.8 1.7 - 7.7 K/uL   Lymphocytes Relative 24 %   Lymphs Abs 2.1 0.7 - 4.0 K/uL   Monocytes Relative 10 %   Monocytes Absolute 0.8 0.1 - 1.0 K/uL   Eosinophils Relative 0 %   Eosinophils Absolute 0.0 0.0 - 0.7 K/uL   Basophils Relative 0 %   Basophils Absolute 0.0 0.0 - 0.1 K/uL  Lipid panel     Status: None   Collection Time: 06/05/15  4:41 AM  Result Value Ref Range   Cholesterol 162 0 - 200 mg/dL   Triglycerides 106 <150 mg/dL   HDL 46 >40 mg/dL   Total CHOL/HDL Ratio 3.5 RATIO   VLDL 21 0 - 40 mg/dL   LDL Cholesterol 95 0 - 99 mg/dL  TSH     Status: None   Collection Time:  06/05/15  4:41 AM  Result Value Ref Range   TSH 1.326 0.350 - 4.500 uIU/mL  Vitamin B12     Status: Abnormal   Collection Time: 06/05/15  4:41 AM  Result Value Ref Range   Vitamin B-12 2099 (H) 180 -  914 pg/mL  Glucose, capillary     Status: Abnormal   Collection Time: 06/05/15  5:31 AM  Result Value Ref Range   Glucose-Capillary 147 (H) 65 - 99 mg/dL   Mr Brain Wo Contrast  06/04/2015  CLINICAL DATA:  Stroke.  Hypertension diabetes COPD EXAM: MRI HEAD WITHOUT CONTRAST TECHNIQUE: Multiplanar, multiecho pulse sequences of the brain and surrounding structures were obtained without intravenous contrast. COMPARISON:  None. FINDINGS: Acute infarct in the right corona radiata white matter. Acute infarct extends into the right parietal cortex and right posterior temporal lobe. Acute infarct in the right occipital lobe . Small area of acute infarct in the left parietal periventricular white matter. Moderate to advanced atrophy. Mild chronic microvascular ischemic change in the white matter. Brainstem and cerebellum intact. Negative for intracranial hemorrhage. Negative for mass or edema.  No shift of the midline structures. Paranasal sinuses clear. Normal orbit. Pituitary normal in size. Skullbase intact. IMPRESSION: Acute infarcts in the right parietal lobe. Small areas of acute infarct in the right occipital lobe, right posterior temporal lobe, and left parietal white matter. Advanced atrophy. Electronically Signed   By: Franchot Gallo M.D.   On: 06/04/2015 19:52    Assessment/Plan: Diagnosis: right parietal/occipital/temporal infarcts with left hemiparesis and visual-spatial deficits 1. Does the need for close, 24 hr/day medical supervision in concert with the patient's rehab needs make it unreasonable for this patient to be served in a less intensive setting? Yes 2. Co-Morbidities requiring supervision/potential complications: COPD, dementia 3. Due to bladder management, bowel management, safety,  skin/wound care, disease management, medication administration, pain management and patient education, does the patient require 24 hr/day rehab nursing? Yes 4. Does the patient require coordinated care of a physician, rehab nurse, PT (1-2 hrs/day, 5 days/week), OT (1-2 hrs/day, 5 days/week) and SLP (1-2 hrs/day, 5 days/week) to address physical and functional deficits in the context of the above medical diagnosis(es)? Yes Addressing deficits in the following areas: balance, endurance, locomotion, strength, transferring, bowel/bladder control, bathing, dressing, feeding, grooming, toileting, cognition and psychosocial support 5. Can the patient actively participate in an intensive therapy program of at least 3 hrs of therapy per day at least 5 days per week? Yes 6. The potential for patient to make measurable gains while on inpatient rehab is good 7. Anticipated functional outcomes upon discharge from inpatient rehab are supervision  with PT, supervision with OT, supervision, min assist and mod assist with SLP. 8. Estimated rehab length of stay to reach the above functional goals is: 12-16 days 9. Does the patient have adequate social supports and living environment to accommodate these discharge functional goals? Yes 10. Anticipated D/C setting: Home 11. Anticipated post D/C treatments: [place] therapy 12. Overall Rehab/Functional Prognosis: excellent  RECOMMENDATIONS: This patient's condition is appropriate for continued rehabilitative care in the following setting: CIR Patient has agreed to participate in recommended program. Yes and Potentially Note that insurance prior authorization may be required for reimbursement for recommended care.  Comment: Rehab Admissions Coordinator to follow up.  Thanks,  Meredith Staggers, MD, Mellody Drown     06/05/2015

## 2015-06-05 NOTE — Progress Notes (Signed)
  Echocardiogram 2D Echocardiogram has been performed.  Chad [name] 06/05/2015, 9:37 AM

## 2015-06-05 NOTE — Clinical Social Work Note (Signed)
Clinical Social Work Assessment  Patient Details  Name: Chad [name] MRN: [id number] Date of Birth: 09-14-1929  Date of referral:  06/05/15               Reason for consult:  Facility Placement, Discharge Planning                Permission sought to share information with:  Facility Sport and exercise psychologist, Family Supports Permission granted to share information::  Yes, Verbal Permission Granted  Name::     Systems developer  Agency::  SNF's  Relationship::  Wife  Contact Information:  [phone]  Housing/Transportation Living arrangements for the past 2 months:  Single Family Home Source of Information:  Spouse, Medical Team Patient Interpreter Needed:  None Criminal Activity/Legal Involvement Pertinent to Current Situation/Hospitalization:  No - Comment as needed Significant Relationships:  Spouse Lives with:  Spouse Do you feel safe going back to the place where you live?  Yes Need for family participation in patient care:  Yes (Comment)  Care giving concerns:  PT recommending CIR once medically stable for discharge. CSW will facilitate SNF backup.   Social Worker assessment / plan:  CSW met with patient. Wife at bedside. CSW introduced role and explained that discharge planning would be discussed. Explained that when CIR is recommended, CSW will facilitate a SNF backup. Patient's wife expressed understanding. They have been together 45 years. Patient said that his wife knows him better than anyone. Patient's wife reviewed SNF packet and decided on [facility] because it is near their home. Patient's wife wants to see if he will be accepted at [facility] before going forward with other referrals. No further concerns. CSW encouraged patient and his wife to contact CSW as needed. CSW will continue to follow patient and his family for support and facilitate discharge to SNF if needed once medically stable.  Employment status:  Retired Nurse, adult PT  Recommendations:  Inpatient [place] / Referral to community resources:  [place]  Patient/Family's Response to care:  Patient and his wife agreeable to SNF backup. Patient's wife involved in patient's care and supportive. Patient and his wife polite and appreciated social work intervention.  Patient/Family's Understanding of and Emotional Response to Diagnosis, Current Treatment, and Prognosis:  Patient's wife knowledgeable of medical interventions and aware of possible discharge to SNF once medically stable if he is not appropriate for CIR or there is no bed availability.  Emotional Assessment Appearance:  Appears stated age Attitude/Demeanor/Rapport:   (Pleasant) Affect (typically observed):  Accepting, Appropriate, Calm, Pleasant Orientation:  Oriented to Self, Oriented to Place, Oriented to Situation Alcohol / Substance use:  Never Used Psych involvement (Current and /or in the community):  No (Comment)  Discharge Needs  Concerns to be addressed:  Care Coordination Readmission within the last 30 days:  No Current discharge risk:  Cognitively Impaired, Dependent with Mobility Barriers to Discharge:  [place], LCSW 06/05/2015, 11:52 AM

## 2015-06-05 NOTE — Progress Notes (Signed)
PROGRESS NOTE    Chad [name]  [id number] DOB: Sep 10, 1929 DOA: 06/01/2015 PCP: Hoyt Koch, MD  Outpatient Specialists:   Brief Narrative: 82 year old male past medical history significant for COPD, diabetes, stroke, moderate dementia with behavioral disturbance, and hypertension. Patient was recently admitted to [facility] in February 2017 for sepsis secondary to a left upper lobe pneumonia. He was treated with broad-spectrum antibiotics and IV fluids for hypotension and eventually discharged home. He was recently seen 5/1 the pulmonary clinic for routine follow-up at which time he felt as though his breathing was doing pretty well, however, 5/8 he again presented to the pulmonary clinic with worsening shortness of breath. He was felt to be suffering from COPD exacerbation and was prescribed Augmentin and prednisone taper. As the day progressed he became markedly more dyspneic prompting him to call EMS. Upon their arrival he was found to be unresponsive requiring King airway to be placed for ventilation. He was given Solu-Medrol, albuterol, and 2 g of magnesium by EMS personnel and was promptly intubated on arrival to the emergency department. ABG concerning for profound respiratory acidosis (pH 6.9 with CO2 > 100). Staff also reportedly suctioning copious amounts of food like secretions from ET tube raising concern for aspiration. Patient was transferred to the Hospitalist team after ICU course. Reported to be leaning to the left side. MRI brain revealed multiple areas of infarcts (thought to be water shed areas as per Neurology). Neurology work up is still in progress.  Assessment & Plan:   Principal Problem:   Acute hypercapnic respiratory failure (HCC) Active Problems:   COPD exacerbation (HCC)   Acute encephalopathy   HCAP (healthcare-associated pneumonia)   COPD with exacerbation (HCC)   Cerebral thrombosis with cerebral infarction  - Acute CVA, possibly water shed  infarct. Follow CTA results. Neurology input is highly appreciated.  - Acute on chronic hypercarbic respiratory failure secondary to COPD exacerbation: Improved significantly. Continue current regimen.   - Probable HCAP/ LLL atelectasis Continue IV antibiotics. Procalcitonin level noted. Mild elevation in WBC also noted. Patient looks stable.[place] for aspiration pneumonitis: See above. Swallowing evaluation as patient's mental status has improved significantly. -HTN, HLD - Historical. Optimize. - Elevated trop -demand ischemia versus prognostic significance indicating severity of illness.  -  AKI versus AKI on CKD - Stable. -  Acute metabolic encephalopathy - Improving. Baseline dementia noted.   FAMILY -wife   DVT prophylaxis:  Heparin  Family Communication: Wife updated today. Disposition Plan: Home in 1-2 days. DC foley's catheter.  Consultants:   ICU transfer.  Procedures: Intubation and self extubation. Antimicrobials: IV Zosyn.  Subjective: No new complaints. No fever or chills. No SOB or chest pain.  Objective: Filed Vitals:   06/05/15 0714 06/05/15 0801 06/05/15 1100 06/05/15 1221  BP: 136/72  143/67 156/69  Pulse: 65  77 72  Temp: 98 F (36.7 C)  98.4 F (36.9 C) 97.3 F (36.3 C)  TempSrc: Oral  Oral Oral  Resp: 20  20 18   Height:      Weight: 74.345 kg (163 lb 14.4 oz)     SpO2: 95% 98% 94% 94%    Intake/Output Summary (Last 24 hours) at 06/05/15 1559 Last data filed at 06/05/15 1000  Gross per 24 hour  Intake    900 ml  Output   1550 ml  Net   -650 ml   Filed Weights   06/03/15 1819 06/04/15 0452 06/05/15 0714  Weight: 77.293 kg (170 lb 6.4 oz) 75.705  kg (166 lb 14.4 oz) 74.345 kg (163 lb 14.4 oz)    Examination:  General exam: Not in distress.  Respiratory system: Good air entry. Cardiovascular system: S1 & S2. Gastrointestinal system: Abdomen is nondistended, soft and nontender.   Central nervous system: Awake and Alert. LUE  weakness Extremities: No edema.  Data Reviewed: I have personally reviewed following labs and imaging studies  CBC:  Recent Labs Lab 06/01/15 1844 06/01/15 1851 06/02/15 0200 06/03/15 0222 06/05/15 0441  WBC 18.9*  --  13.3* 16.8* 8.8  NEUTROABS 6.0  --   --   --  5.8  HGB 12.9* 15.0 10.9* 10.6* 10.8*  HCT 42.3 44.0 35.6* 33.8* 34.2*  MCV 89.2  --  84.0 82.6 83.8  PLT 298  --  190 207 [id number]   Basic Metabolic Panel:  Recent Labs Lab 06/01/15 1844 06/01/15 1851 06/02/15 0200 06/03/15 0222 06/05/15 0441  NA 135 137 136 142 142  K 5.0 5.1 4.3 3.8 3.7  CL 101 106 102 109 105  CO2 17*  --  16* 20* 25  GLUCOSE 306* 292* 346* 214* 133*  BUN 22* 29* 25* 24* 22*  CREATININE 1.70* 1.40* 1.49* 1.38* 1.48*  CALCIUM 10.1  --  8.5* 8.6* 9.1  MG 3.7*  --  2.0  --   --   PHOS 9.0*  --  1.6*  --  4.0   GFR: Estimated Creatinine Clearance: 37.8 mL/min (by C-G formula based on Cr of 1.48). Liver Function Tests:  Recent Labs Lab 06/01/15 1844 06/05/15 0441  AST 33  --   ALT 22  --   ALKPHOS 88  --   BILITOT 0.4  --   PROT 7.3  --   ALBUMIN 4.1 3.0*    Recent Labs Lab 06/01/15 2344  LIPASE 26   No results for input(s): AMMONIA in the last 168 hours. Coagulation Profile:  Recent Labs Lab 06/01/15 1844  INR 1.21   Cardiac Enzymes:  Recent Labs Lab 06/01/15 1844 06/01/15 2119 06/02/15 0200 06/02/15 0733 06/02/15 1040  TROPONINI <0.03 0.16* 0.33* 0.67* 0.63*   BNP (last 3 results) No results for input(s): PROBNP in the last 8760 hours. HbA1C: No results for input(s): HGBA1C in the last 72 hours. CBG:  Recent Labs Lab 06/04/15 1623 06/04/15 2207 06/05/15 0531 06/05/15 1107 06/05/15 1545  GLUCAP 179* 168* 147* 313* 318*   Lipid Profile:  Recent Labs  06/05/15 0441  CHOL 162  HDL 46  LDLCALC 95  TRIG 106  CHOLHDL 3.5   Thyroid Function Tests:  Recent Labs  06/05/15 0441  TSH 1.326   Anemia Panel:  Recent Labs  06/05/15 0441   VITAMINB12 2099*   Urine analysis:    Component Value Date/Time   COLORURINE YELLOW 06/01/2015 1923   APPEARANCEUR CLOUDY* 06/01/2015 1923   LABSPEC 1.018 06/01/2015 1923   PHURINE 6.0 06/01/2015 1923   GLUCOSEU 500* 06/01/2015 1923   HGBUR SMALL* 06/01/2015 [place] NEGATIVE 06/01/2015 [place] NEGATIVE 06/01/2015 1923   PROTEINUR 100* 06/01/2015 1923   NITRITE NEGATIVE 06/01/2015 1923   LEUKOCYTESUR NEGATIVE 06/01/2015 1923   Sepsis Labs: @LABRCNTIP (procalcitonin:4,lacticidven:4)  ) Recent Results (from the past 240 hour(s))  Blood Culture (routine x 2)     Status: None (Preliminary result)   Collection Time: 06/01/15  6:35 PM  Result Value Ref Range Status   Specimen Description BLOOD RIGHT ANTECUBITAL  Final   Special Requests BOTTLES DRAWN AEROBIC ONLY 5CC  Final  Culture NO GROWTH 4 DAYS  Final   Report Status PENDING  Incomplete  Blood Culture (routine x 2)     Status: None (Preliminary result)   Collection Time: 06/01/15  6:40 PM  Result Value Ref Range Status   Specimen Description BLOOD LEFT ANTECUBITAL  Final   Special Requests BOTTLES DRAWN AEROBIC ONLY 5CC  Final   Culture NO GROWTH 4 DAYS  Final   Report Status PENDING  Incomplete  Urine culture     Status: None   Collection Time: 06/01/15  7:10 PM  Result Value Ref Range Status   Specimen Description URINE, CATHETERIZED  Final   Special Requests NONE  Final   Culture NO GROWTH 2 DAYS  Final   Report Status 06/03/2015 FINAL  Final  MRSA PCR Screening     Status: None   Collection Time: 06/01/15  9:49 PM  Result Value Ref Range Status   MRSA by PCR NEGATIVE NEGATIVE Final    Comment:        The GeneXpert MRSA Assay (FDA approved for NASAL specimens only), is one component of a comprehensive MRSA colonization surveillance program. It is not intended to diagnose MRSA infection nor to guide or monitor treatment for MRSA infections.   Culture, bal-quantitative     Status: None  (Preliminary result)   Collection Time: 06/02/15  2:20 PM  Result Value Ref Range Status   Specimen Description BRONCHIAL ALVEOLAR LAVAGE  Final   Special Requests NONE  Final   Gram Stain   Final    ABUNDANT WBC PRESENT, PREDOMINANTLY PMN NO SQUAMOUS EPITHELIAL CELLS SEEN FEW YEAST Performed at [facility]    Culture   Final    MODERATE PSEUDOMONAS AERUGINOSA YEAST CONSISTENT WITH CANDIDA SPECIES Performed at [facility]    Report Status PENDING  Incomplete         Radiology Studies: Ct Angio Head W/cm &/or Wo Cm  06/05/2015  CLINICAL DATA:  82 year old male with symptoms of new onset left side weakness following admission for respiratory failure requiring intubation. Found have patchy right MCA and PCA territory infarcts on MRI. Initial encounter. EXAM: CT ANGIOGRAPHY HEAD AND NECK TECHNIQUE: Multidetector CT imaging of the head and neck was performed using the standard protocol during bolus administration of intravenous contrast. Multiplanar CT image reconstructions and MIPs were obtained to evaluate the vascular anatomy. Carotid stenosis measurements (when applicable) are obtained utilizing NASCET criteria, using the distal internal carotid diameter as the denominator. CONTRAST:  50 mL Isovue 370 COMPARISON:  Brain MRI 06/04/2015. Head CT without contrast 05/15/2014 and earlier. CT Abdomen and Pelvis 05/31/2010 FINDINGS: CT HEAD Brain: No acute intracranial hemorrhage identified. Cortically based infarct evident at the right superior frontal gyrus on series 7, image 25. No intracranial mass effect. There is also increased hypodensity in the right caudate on image 17. Other recently depicted small areas of ischemia remain occult on CT. Stable gray-white matter differentiation elsewhere. No ventriculomegaly. Calvarium and skull base: Stable. No acute osseous abnormality identified. Paranasal sinuses: Right frontal sinus opacification and mucosal thickening is stable.  Other visualized sinuses and mastoids are clear. Orbits: Stable, postoperative changes to the globes right greater than left. No acute scalp soft tissue findings. CTA NECK Skeleton: Advanced cervical spine degeneration. There is ankylosis in some of the visualized upper thoracic spine. Incidental spina bifida occulta at T1. No acute osseous abnormality identified. Other neck: Dilated and patulous thoracic esophagus. In 2012 gastric hiatal hernia was demonstrated, but the distal esophagus  was not visible. No superior mediastinal lymphadenopathy. Mild anterior upper lobe scarring and paraseptal emphysema greater on the left. Negative thyroid. The glottis is closed. Motion artifact through the hypopharynx and oropharynx. Negative nasopharynx. Negative superior parapharyngeal spaces. Sublingual space, submandibular glands and parotid glands appear normal. No cervical lymphadenopathy. Aortic arch: 3 vessel arch configuration with mild to moderate calcified arch and great vessel atherosclerosis. Right carotid system: No brachiocephalic artery or right CCA origin stenosis. Mildly tortuous right CCA with occasional calcified plaque. Soft and calcified plaque in the right ICA origin and bulb. Complex atherosclerosis in the distal bulb which appears to be partially ulcerated. See series 5, images 92 and 93. There might be stenosis here up to 65 % with respect to the distal vessel (series 13, image 53). Distal to this level the cervical right ICA is normal. Left carotid system: No left CCA origin stenosis. Mildly tortuous left CCA with occasional calcified plaque proximal to the bifurcation. Bulky calcified plaque at the left ICA origin and bulb, but no stenosis results (series 12, image 119). Patulous bulb compatible with previous left carotid surgery. Negative cervical left ICA distal to the bulb. Vertebral arteries:No proximal right subclavian artery stenosis despite calcified plaque. The right vertebral artery is occluded  at its origin with some calcified plaque, series 12, image 125. There is faint reconstituted enhancement in the distal right V2 segment beginning at C3. The vessel has a more normal albeit non dominant appearance at the C1 level and is patent as it crosses the dura. No proximal left subclavian artery stenosis despite soft and calcified plaque. Calcified plaque at the left vertebral artery origin but only mild stenosis results. Dominant left vertebral artery with mild tortuosity in the neck and no other atherosclerosis. CTA HEAD Posterior circulation: Dominant left vertebral artery without stenosis in the posterior fossa. Patent vertebrobasilar junction. Diminutive but patent right V4 segment. Right PICA origin is patent. Left PICA origin is diminutive. No basilar artery stenosis. Normal SCA and PCA origins. Posterior communicating arteries are diminutive or absent. Bilateral PCA branches are within normal limits. Anterior circulation: Both ICA siphons are patent. Minimal calcified plaque on the left with no stenosis. Normal left ophthalmic artery origin. Mild calcified plaque and dolichoectasia on the right with no stenosis. Normal right ophthalmic artery origin. Patent carotid termini. Normal MCA and ACA origins. Tortuous A1 segments. Normal anterior communicating artery. Bilateral ACA branches are mildly irregular but otherwise normal (series 16, image 18). Left MCA M1 segment, bifurcation, and left MCA branches are within normal limits. Right MCA M1 segment, bifurcation, and right MCA bifurcation are patent and within normal limits. There seem to be a paucity of posterior most right M3 branches, but no proximal M2 occlusion is identified. Venous sinuses: Patent. Anatomic variants: Dominant appearing left vertebral artery. Delayed phase: No abnormal enhancement identified. IMPRESSION: 1. Negative for emergent large vessel occlusion associated with the acute right cerebral infarcts. There is a paucity of posterior  right MCA M3 branches, but no M2 branch occlusion identified. 2. Complex atherosclerotic plaque at the right ICA origin and bulb with some ulceration. Distal bulb level stenosis which might be hemodynamically significant (estimated at up to 65%). Correlation with right side carotid Doppler ultrasound may be valuable. 3. Previous left carotid surgery.  Calcified plaque but no stenosis. 4. 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 5. Dominant appearing left vertebral artery with calcified plaque and mild stenosis  at its origin. Otherwise negative posterior circulation. 6. Expected evolution of the larger ischemic foci demonstrated yesterday by MRI. No associated hemorrhage or mass effect. 7. Patulous thoracic esophagus containing fluid/debris. Consider achalasia. 8. Advanced cervical spine degeneration. Areas of ankylosis in the upper thoracic spine. Electronically Signed   By: Genevie Ann M.D.   On: 06/05/2015 14:59   Ct Angio Neck W/cm &/or Wo/cm  06/05/2015  CLINICAL DATA:  82 year old male with symptoms of new onset left side weakness following admission for respiratory failure requiring intubation. Found have patchy right MCA and PCA territory infarcts on MRI. Initial encounter. EXAM: CT ANGIOGRAPHY HEAD AND NECK TECHNIQUE: Multidetector CT imaging of the head and neck was performed using the standard protocol during bolus administration of intravenous contrast. Multiplanar CT image reconstructions and MIPs were obtained to evaluate the vascular anatomy. Carotid stenosis measurements (when applicable) are obtained utilizing NASCET criteria, using the distal internal carotid diameter as the denominator. CONTRAST:  50 mL Isovue 370 COMPARISON:  Brain MRI 06/04/2015. Head CT without contrast 05/15/2014 and earlier. CT Abdomen and Pelvis 05/31/2010 FINDINGS: CT HEAD Brain: No acute intracranial hemorrhage  identified. Cortically based infarct evident at the right superior frontal gyrus on series 7, image 25. No intracranial mass effect. There is also increased hypodensity in the right caudate on image 17. Other recently depicted small areas of ischemia remain occult on CT. Stable gray-white matter differentiation elsewhere. No ventriculomegaly. Calvarium and skull base: Stable. No acute osseous abnormality identified. Paranasal sinuses: Right frontal sinus opacification and mucosal thickening is stable. Other visualized sinuses and mastoids are clear. Orbits: Stable, postoperative changes to the globes right greater than left. No acute scalp soft tissue findings. CTA NECK Skeleton: Advanced cervical spine degeneration. There is ankylosis in some of the visualized upper thoracic spine. Incidental spina bifida occulta at T1. No acute osseous abnormality identified. Other neck: Dilated and patulous thoracic esophagus. In 2012 gastric hiatal hernia was demonstrated, but the distal esophagus was not visible. No superior mediastinal lymphadenopathy. Mild anterior upper lobe scarring and paraseptal emphysema greater on the left. Negative thyroid. The glottis is closed. Motion artifact through the hypopharynx and oropharynx. Negative nasopharynx. Negative superior parapharyngeal spaces. Sublingual space, submandibular glands and parotid glands appear normal. No cervical lymphadenopathy. Aortic arch: 3 vessel arch configuration with mild to moderate calcified arch and great vessel atherosclerosis. Right carotid system: No brachiocephalic artery or right CCA origin stenosis. Mildly tortuous right CCA with occasional calcified plaque. Soft and calcified plaque in the right ICA origin and bulb. Complex atherosclerosis in the distal bulb which appears to be partially ulcerated. See series 5, images 92 and 93. There might be stenosis here up to 65 % with respect to the distal vessel (series 13, image 53). Distal to this level the  cervical right ICA is normal. Left carotid system: No left CCA origin stenosis. Mildly tortuous left CCA with occasional calcified plaque proximal to the bifurcation. Bulky calcified plaque at the left ICA origin and bulb, but no stenosis results (series 12, image 119). Patulous bulb compatible with previous left carotid surgery. Negative cervical left ICA distal to the bulb. Vertebral arteries:No proximal right subclavian artery stenosis despite calcified plaque. The right vertebral artery is occluded at its origin with some calcified plaque, series 12, image 125. There is faint reconstituted enhancement in the distal right V2 segment beginning at C3. The vessel has a more normal albeit non dominant appearance at the C1 level and is patent as it crosses the dura. No  proximal left subclavian artery stenosis despite soft and calcified plaque. Calcified plaque at the left vertebral artery origin but only mild stenosis results. Dominant left vertebral artery with mild tortuosity in the neck and no other atherosclerosis. CTA HEAD Posterior circulation: Dominant left vertebral artery without stenosis in the posterior fossa. Patent vertebrobasilar junction. Diminutive but patent right V4 segment. Right PICA origin is patent. Left PICA origin is diminutive. No basilar artery stenosis. Normal SCA and PCA origins. Posterior communicating arteries are diminutive or absent. Bilateral PCA branches are within normal limits. Anterior circulation: Both ICA siphons are patent. Minimal calcified plaque on the left with no stenosis. Normal left ophthalmic artery origin. Mild calcified plaque and dolichoectasia on the right with no stenosis. Normal right ophthalmic artery origin. Patent carotid termini. Normal MCA and ACA origins. Tortuous A1 segments. Normal anterior communicating artery. Bilateral ACA branches are mildly irregular but otherwise normal (series 16, image 18). Left MCA M1 segment, bifurcation, and left MCA branches are  within normal limits. Right MCA M1 segment, bifurcation, and right MCA bifurcation are patent and within normal limits. There seem to be a paucity of posterior most right M3 branches, but no proximal M2 occlusion is identified. Venous sinuses: Patent. Anatomic variants: Dominant appearing left vertebral artery. Delayed phase: No abnormal enhancement identified. IMPRESSION: 1. Negative for emergent large vessel occlusion associated with the acute right cerebral infarcts. There is a paucity of posterior right MCA M3 branches, but no M2 branch occlusion identified. 2. Complex atherosclerotic plaque at the right ICA origin and bulb with some ulceration. Distal bulb level stenosis which might be hemodynamically significant (estimated at up to 65%). Correlation with right side carotid Doppler ultrasound may be valuable. 3. Previous left carotid surgery.  Calcified plaque but no stenosis. 4. 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 5. Dominant appearing left vertebral artery with calcified plaque and mild stenosis at its origin. Otherwise negative posterior circulation. 6. Expected evolution of the larger ischemic foci demonstrated yesterday by MRI. No associated hemorrhage or mass effect. 7. Patulous thoracic esophagus containing fluid/debris. Consider achalasia. 8. Advanced cervical spine degeneration. Areas of ankylosis in the upper thoracic spine. Electronically Signed   By: Genevie Ann M.D.   On: 06/05/2015 14:59   Mr Brain Wo Contrast  06/04/2015  CLINICAL DATA:  Stroke.  Hypertension diabetes COPD EXAM: MRI HEAD WITHOUT CONTRAST TECHNIQUE: Multiplanar, multiecho pulse sequences of the brain and surrounding structures were obtained without intravenous contrast. COMPARISON:  None. FINDINGS: Acute infarct in the right corona radiata white matter. Acute infarct extends into the right parietal cortex and right  posterior temporal lobe. Acute infarct in the right occipital lobe . Small area of acute infarct in the left parietal periventricular white matter. Moderate to advanced atrophy. Mild chronic microvascular ischemic change in the white matter. Brainstem and cerebellum intact. Negative for intracranial hemorrhage. Negative for mass or edema.  No shift of the midline structures. Paranasal sinuses clear. Normal orbit. Pituitary normal in size. Skullbase intact. IMPRESSION: Acute infarcts in the right parietal lobe. Small areas of acute infarct in the right occipital lobe, right posterior temporal lobe, and left parietal white matter. Advanced atrophy. Electronically Signed   By: Franchot Gallo M.D.   On: 06/04/2015 19:52        Scheduled Meds: . amLODipine  5 mg Oral Daily  . aspirin EC  81 mg Oral Daily  . brimonidine  1 drop  Left Eye BID  . cholecalciferol  1,000 Units Oral Daily  . donepezil  10 mg Oral QHS  . feeding supplement (GLUCERNA SHAKE)  237 mL Oral BID BM  . heparin  5,000 Units Subcutaneous Q8H  . insulin aspart  0-5 Units Subcutaneous QHS  . insulin aspart  0-9 Units Subcutaneous TID WC  . insulin glargine  10 Units Subcutaneous Daily  . losartan  100 mg Oral Daily  . methylPREDNISolone (SOLU-MEDROL) injection  40 mg Intravenous Q24H  . mirtazapine  7.5 mg Oral QHS  . multivitamin with minerals  1 tablet Oral Daily  . piperacillin-tazobactam (ZOSYN)  IV  3.375 g Intravenous Q8H  . simvastatin  20 mg Oral q1800  . vitamin B-12  1,000 mcg Oral Daily   Continuous Infusions: . sodium chloride 50 mL/hr at 06/03/15 1108     LOS: 4 days    Time spent: [place]    Bonnell Public, MD [facility] Pager [phone].  If 7PM-7AM, please contact night-coverage [email or web address] Password [facility] 06/05/2015, 3:59 PM

## 2015-06-05 NOTE — Progress Notes (Signed)
STROKE TEAM PROGRESS NOTE   HISTORY OF PRESENT ILLNESS Chad [name] is an 82 y.o. male history of COPD, diabetes mellitus, hypertension, hyperlipidemia and previous stroke, admitted on 06/01/2015 for pneumonia and respiratory failure requiring intubation and mechanical ventilation. Patient is improved with antibiotic therapy and managed to self extubated himself yesterday. He did not require reintubation. His wife noted that he was leaning to the left side and his left arm and wrist were kept in flexed posture. No weakness was noted. He has slight left lower facial droop from previous stroke which is unchanged. There's been no change in speech or swallowing. MRI of the brain showed acute infarctions involving the right parietal lobe as well as right occipital and posterior temporal lobes. There was also a small area of infarction involving the left parietal lobe. NIH stroke score was 4.  LSN: Unclear tPA Given: No: Unclear when last known well mRankin:   SUBJECTIVE (INTERVAL HISTORY) His daughter is at the bedside.  Overall he feels his condition is stable. He is sitting in chair, appropriate. Still has LUE weakness but mild. Pending CTA head and neck. Pt had b/l CEA in 1997.   OBJECTIVE Temp:  [98 F (36.7 C)-98.5 F (36.9 C)] 98 F (36.7 C) (05/12 0714) Pulse Rate:  [65-88] 65 (05/12 0714) Cardiac Rhythm:  [-] Normal sinus rhythm (05/11 1900) Resp:  [18-20] 20 (05/12 0714) BP: (136-161)/(70-74) 136/72 mmHg (05/12 0714) SpO2:  [94 %-95 %] 95 % (05/12 0714) Weight:  [74.345 kg (163 lb 14.4 oz)] 74.345 kg (163 lb 14.4 oz) (05/12 0714)  CBC:  Recent Labs Lab 06/01/15 1844  06/03/15 0222 06/05/15 0441  WBC 18.9*  < > 16.8* 8.8  NEUTROABS 6.0  --   --  5.8  HGB 12.9*  < > 10.6* 10.8*  HCT 42.3  < > 33.8* 34.2*  MCV 89.2  < > 82.6 83.8  PLT 298  < > 207 199  < > = values in this interval not displayed.  Basic Metabolic Panel:  Recent Labs Lab 06/01/15 1844  06/02/15 0200  06/03/15 0222 06/05/15 0441  NA 135  < > 136 142 142  K 5.0  < > 4.3 3.8 3.7  CL 101  < > 102 109 105  CO2 17*  --  16* 20* 25  GLUCOSE 306*  < > 346* 214* 133*  BUN 22*  < > 25* 24* 22*  CREATININE 1.70*  < > 1.49* 1.38* 1.48*  CALCIUM 10.1  --  8.5* 8.6* 9.1  MG 3.7*  --  2.0  --   --   PHOS 9.0*  --  1.6*  --  4.0  < > = values in this interval not displayed.  Lipid Panel:    Component Value Date/Time   CHOL 162 06/05/2015 0441   TRIG 106 06/05/2015 0441   HDL 46 06/05/2015 0441   CHOLHDL 3.5 06/05/2015 0441   VLDL 21 06/05/2015 0441   LDLCALC 95 06/05/2015 0441   HgbA1c: No results found for: HGBA1C Urine Drug Screen: No results found for: LABOPIA, COCAINSCRNUR, LABBENZ, AMPHETMU, THCU, LABBARB    IMAGING  Mr Brain Wo Contrast 06/04/2015   Acute infarcts in the right parietal lobe. Small areas of acute infarct in the right occipital lobe, right posterior temporal lobe, and left parietal white matter. Advanced atrophy.   CT Angio Head and Neck - pending  LE venous doppler - - No evidence of deep vein or superficial thrombosis involving the  right  lower extremity and left lower extremity. - No evidence of Baker&'s cyst on the right or left.  TTE - pending    PHYSICAL EXAM  Temp:  [97.3 F (36.3 C)-98.4 F (36.9 C)] 97.3 F (36.3 C) (05/12 1221) Pulse Rate:  [65-77] 72 (05/12 1221) Resp:  [18-20] 18 (05/12 1221) BP: (136-156)/(67-72) 156/69 mmHg (05/12 1221) SpO2:  [94 %-98 %] 94 % (05/12 1221) Weight:  [163 lb 14.4 oz (74.345 kg)] 163 lb 14.4 oz (74.345 kg) (05/12 0714)  General - Well nourished, well developed, in no apparent distress.  Ophthalmologic - Fundi not visualized due to eye movement.  Cardiovascular - Regular rate and rhythm.  Mental Status -  Level of arousal and orientation to place, and person were intact, however, not orientated to time, month or year. Language including expression, naming, repetition, comprehension was assessed and  found intact. Fund of Knowledge was assessed and was impaired.  Cranial Nerves II - XII - II - Visual field intact OU. III, IV, VI - Extraocular movements intact. V - Facial sensation intact bilaterally. VII - Facial movement intact bilaterally. VIII - Hearing & vestibular intact bilaterally. X - Palate elevates symmetrically. XI - Chin turning & shoulder shrug intact bilaterally. XII - Tongue protrusion intact.  Motor Strength - The patient's strength was normal in all extremities except LUE 4/5 proximal and distal with dexterity difficulty at left hand and pronator drift was absent.  Bulk was normal and fasciculations were absent.   Motor Tone - Muscle tone was assessed at the neck and appendages and was normal.  Reflexes - The patient's reflexes were 1+ in all extremities and he had no pathological reflexes.  Sensory - Light touch, temperature/pinprick were assessed and were symmetrical.    Coordination - The patient had mild dysmetria at left hand with FTN.  Tremor was absent.  Gait and Station - deferred due to safety concerns.    ASSESSMENT/PLAN Mr. Chad [name] is a 82 y.o. male with history of COPD, diabetes mellitus, hypertension, renal disease, hyperlipidemia,  previous stroke, pneumonia and respiratory failure presenting with left upper extremity flexion. He did not receive IV t-PA due to unknown time of onset.  Strokes: Bilateral MCAs and watershed infarcts, R>L - hypoperfusion vs. Embolic. Pt did have low BPs at the time of intubation and sedation, with bilateral ICA stenosis, supporting for watershed infarcts, however, cardioembolic source can not be completely ruled out as pt also has non watershed infarcts.   Resultant  Left UE weakness  MRI - acute infarctions of the right parietal lobe, right occipital lobe, post. temporal lobes, and left parietal lobe.  CTA of head and neck - pending  2D Echo - pending  Recommend 30 day cardiac event monitoring to rule  out afib  LDL - 95  HgbA1c - pending  VTE prophylaxis - subcutaneous heparin  Diet Carb Modified Fluid consistency:: Thin; Room service appropriate?: Yes  aspirin 81 mg daily prior to admission, now on aspirin 81 mg daily.   Patient counseled to be compliant with his antithrombotic medications  Ongoing aggressive stroke risk factor management  Therapy recommendations: CIR    Disposition: Pending  Carotid stenosis  S/p bilateral CEA in 1997  CTA head and neck pending  Low BPs on 06/01/15 7pm to 9pm 91/67, 99/58  Avoid hypotension  BP goal 120-150  Hypertension  Stable  Permissive hypertension (OK if < 220/120) but gradually normalize in 5-7 days  Long term BP goal 120-150 due to ICA stenosis  Avoid hypotension  Hyperlipidemia  Home meds:  No lipid lowering medications prior to admission.  LDL 95, goal < 70  Zocor 20 mg daily added  Continue statin at discharge  Diabetes  HgbA1c pending, goal < 7.0  Uncontrolled  Other Stroke Risk Factors  Advanced age  Hx of stroke  Other Active Problems  Anemia  Renal insufficiency  Hospital day # 4  Rosalin Hawking, MD PhD Stroke Neurology 06/05/2015 2:49 PM      To contact Stroke Continuity provider, please refer to [email or web address] After hours, contact General Neurology

## 2015-06-05 NOTE — Progress Notes (Signed)
VASCULAR LAB PRELIMINARY  PRELIMINARY  PRELIMINARY  PRELIMINARY  Bilateral lower extremity venous duplex completed.      Bilateral:  No evidence of DVT, superficial thrombosis, or Baker's Cyst.   Janifer Adie, RVT, RDMS 06/05/2015, 9:44 AM

## 2015-06-05 NOTE — NC FL2 (Signed)
[place] LEVEL OF CARE SCREENING TOOL     IDENTIFICATION  Patient Name: Chad [name] Birthdate: [birth year] Sex: male Admission Date (Current Location): 06/01/2015  [facility] and [place] Number:  Herbalist and Address:  [facility], [place] [address], [place], [place] [address]      Provider Number: [id number]  Attending Physician Name and Address:  Bonnell Public, MD  Relative Name and Phone Number:       Current Level of Care: Hospital Recommended Level of Care: [place] Prior Approval Number:    Date Approved/Denied:   PASRR Number: [id number]  Discharge Plan: SNF    Current Diagnoses: Patient Active Problem List   Diagnosis Date Noted  . HCAP (healthcare-associated pneumonia) 06/02/2015  . COPD with exacerbation ([place])   . COPD exacerbation ([place]) 06/01/2015  . Acute hypercapnic respiratory failure ([place])   . Acute respiratory acidosis   . Acute encephalopathy   . AKI (acute kidney injury) ([place])   . Lactic acidosis     Orientation RESPIRATION BLADDER Height & Weight     Self, Situation, Place  Normal Incontinent, External catheter Weight: 163 lb 14.4 oz (74.345 kg) (bed scale) Height:  5\' 9"  (175.3 cm)  BEHAVIORAL SYMPTOMS/MOOD NEUROLOGICAL BOWEL NUTRITION STATUS   (None)  (None) Continent Diet (Carb-modified)  AMBULATORY STATUS COMMUNICATION OF NEEDS Skin   Limited Assist Verbally Other (Comment) (Echhymosis on arm and abdomen)                       Personal Care Assistance Level of Assistance  Bathing, Feeding, Dressing Bathing Assistance: Independent Feeding assistance: Independent Dressing Assistance: Independent     Functional Limitations Info  Sight, Hearing, Speech Sight Info: Adequate Hearing Info: Adequate Speech Info: Adequate    SPECIAL CARE FACTORS FREQUENCY  PT (By licensed PT)     PT Frequency: 5 x week              Contractures Contractures Info: Not  present    Additional Factors Info  Code Status, Allergies Code Status Info: Full Allergies Info: Avelox, Timoptic, Sulfa Antibiotics           Current Medications (06/05/2015):  This is the current hospital active medication list Current Facility-Administered Medications  Medication Dose Route Frequency Provider Last Rate Last Dose  . 0.9 %  sodium chloride infusion  250 mL Intravenous PRN Corey Harold, NP      . 0.9 %  sodium chloride infusion   Intravenous Continuous Rigoberto Noel, MD 50 mL/hr at 06/03/15 1108    . albuterol (PROVENTIL) (2.5 MG/3ML) 0.083% nebulizer solution 2.5 mg  2.5 mg Nebulization Q2H PRN Corey Harold, NP   2.5 mg at 06/03/15 1612  . amLODipine (NORVASC) tablet 5 mg  5 mg Oral Daily Kara Mead V, MD   5 mg at 06/05/15 1117  . aspirin EC tablet 81 mg  81 mg Oral Daily Chesley Mires, MD   81 mg at 06/05/15 1116  . brimonidine (ALPHAGAN) 0.15 % ophthalmic solution 1 drop  1 drop Left Eye BID Rigoberto Noel, MD   1 drop at 06/05/15 1117  . cholecalciferol (VITAMIN D) tablet 1,000 Units  1,000 Units Oral Daily Chesley Mires, MD   1,000 Units at 06/05/15 1117  . donepezil (ARICEPT) tablet 10 mg  10 mg Oral QHS Chesley Mires, MD   10 mg at 06/04/15 2210  . feeding supplement (GLUCERNA  SHAKE) (GLUCERNA SHAKE) liquid 237 mL  237 mL Oral BID BM Bonnell Public, MD      . fluticasone (FLONASE) 50 MCG/ACT nasal spray 2 spray  2 spray Each Nare BID PRN Chesley Mires, MD      . heparin injection 5,000 Units  5,000 Units Subcutaneous Q8H Corey Harold, NP   5,000 Units at 06/05/15 0531  . hydrALAZINE (APRESOLINE) injection 10-20 mg  10-20 mg Intravenous Q4H PRN Kara Mead V, MD      . insulin aspart (novoLOG) injection 2-6 Units  2-6 Units Subcutaneous TID AC & HS Bonnell Public, MD   2 Units at 06/05/15 0532  . insulin glargine (LANTUS) injection 10 Units  10 Units Subcutaneous Daily Rigoberto Noel, MD   10 Units at 06/05/15 1118  . losartan (COZAAR) tablet 100 mg  100 mg  Oral Daily Chesley Mires, MD   100 mg at 06/05/15 1116  . methylPREDNISolone sodium succinate (SOLU-MEDROL) 40 mg/mL injection 40 mg  40 mg Intravenous Q24H Kara Mead V, MD   40 mg at 06/05/15 0743  . mirtazapine (REMERON) tablet 7.5 mg  7.5 mg Oral QHS Bonnell Public, MD   7.5 mg at 06/04/15 2210  . multivitamin with minerals tablet 1 tablet  1 tablet Oral Daily Bonnell Public, MD   1 tablet at 06/05/15 1116  . piperacillin-tazobactam (ZOSYN) IVPB 3.375 g  3.375 g Intravenous Q8H Rebecka Apley, RPH   3.375 g at 06/05/15 0529  . simvastatin (ZOCOR) tablet 20 mg  20 mg Oral q1800 David L Rinehuls, PA-C      . traZODone (DESYREL) tablet 50 mg  50 mg Oral QHS PRN Chesley Mires, MD   50 mg at 06/04/15 2210  . vitamin B-12 (CYANOCOBALAMIN) tablet 1,000 mcg  1,000 mcg Oral Daily Chesley Mires, MD   1,000 mcg at 06/05/15 1206     Discharge Medications: Please see discharge summary for a list of discharge medications.  Relevant Imaging Results:  Relevant Lab Results:   Additional Information SS#: [id number]  Candie Chroman, LCSW

## 2015-06-06 ENCOUNTER — Inpatient Hospital Stay (HOSPITAL_COMMUNITY): Payer: Medicare HMO

## 2015-06-06 DIAGNOSIS — I639 Cerebral infarction, unspecified: Secondary | ICD-10-CM

## 2015-06-06 LAB — GLUCOSE, CAPILLARY
GLUCOSE-CAPILLARY: 266 mg/dL — AB (ref 65–99)
GLUCOSE-CAPILLARY: 172 mg/dL — AB (ref 65–99)
Glucose-Capillary: 146 mg/dL — ABNORMAL HIGH (ref 65–99)
Glucose-Capillary: 237 mg/dL — ABNORMAL HIGH (ref 65–99)

## 2015-06-06 LAB — CULTURE, BLOOD (ROUTINE X 2)
CULTURE: NO GROWTH
Culture: NO GROWTH

## 2015-06-06 LAB — HEMOGLOBIN A1C
Hgb A1c MFr Bld: 8.3 % — ABNORMAL HIGH (ref 4.8–5.6)
Mean Plasma Glucose: 192 mg/dL

## 2015-06-06 NOTE — Progress Notes (Signed)
VASCULAR LAB PRELIMINARY  PRELIMINARY  PRELIMINARY  PRELIMINARY  Carotid duplex completed.    Preliminary report:  Right: 1-39% right ICA stenosis.  Moderate mixed plaque origin ICA.  Left: 1-39% ICA plaquing with moderate mixed plaque CEA.  Chad [name], RVT 06/06/2015, 12:45 PM

## 2015-06-06 NOTE — Progress Notes (Signed)
STROKE TEAM PROGRESS NOTE   HISTORY OF PRESENT ILLNESS Chad [name] is an 82 y.o. male history of COPD, diabetes mellitus, hypertension, hyperlipidemia and previous stroke, admitted on 06/01/2015 for pneumonia and respiratory failure requiring intubation and mechanical ventilation. Patient is improved with antibiotic therapy and managed to self extubated himself yesterday. He did not require reintubation. His wife noted that he was leaning to the left side and his left arm and wrist were kept in flexed posture. No weakness was noted. He has slight left lower facial droop from previous stroke which is unchanged. There's been no change in speech or swallowing. MRI of the brain showed acute infarctions involving the right parietal lobe as well as right occipital and posterior temporal lobes. There was also a small area of infarction involving the left parietal lobe. NIH stroke score was 4.  LSN: Unclear tPA Given: No: Unclear when last known well mRankin:   SUBJECTIVE (INTERVAL HISTORY) His wife is at the bedside.  Overall he feels his condition is stable, he wants to go home. He is laying in bed, appropriate. Still has LUE weakness but mild. Pt had b/l CEA in 1997.  OBJECTIVE Temp:  [97.3 F (36.3 C)-98.4 F (36.9 C)] 97.9 F (36.6 C) (05/13 0530) Pulse Rate:  [57-77] 57 (05/13 0530) Cardiac Rhythm:  [-] Normal sinus rhythm (05/12 1900) Resp:  [16-20] 16 (05/13 0530) BP: (143-171)/(67-72) 171/72 mmHg (05/13 0530) SpO2:  [93 %-94 %] 93 % (05/13 0530) Weight:  [74.798 kg (164 lb 14.4 oz)] 74.798 kg (164 lb 14.4 oz) (05/13 0530)  CBC:  Recent Labs Lab 06/01/15 1844  06/03/15 0222 06/05/15 0441  WBC 18.9*  < > 16.8* 8.8  NEUTROABS 6.0  --   --  5.8  HGB 12.9*  < > 10.6* 10.8*  HCT 42.3  < > 33.8* 34.2*  MCV 89.2  < > 82.6 83.8  PLT 298  < > 207 199  < > = values in this interval not displayed.  Basic Metabolic Panel:  Recent Labs Lab 06/01/15 1844  06/02/15 0200  06/03/15 0222 06/05/15 0441  NA 135  < > 136 142 142  K 5.0  < > 4.3 3.8 3.7  CL 101  < > 102 109 105  CO2 17*  --  16* 20* 25  GLUCOSE 306*  < > 346* 214* 133*  BUN 22*  < > 25* 24* 22*  CREATININE 1.70*  < > 1.49* 1.38* 1.48*  CALCIUM 10.1  --  8.5* 8.6* 9.1  MG 3.7*  --  2.0  --   --   PHOS 9.0*  --  1.6*  --  4.0  < > = values in this interval not displayed.  Lipid Panel:     Component Value Date/Time   CHOL 162 06/05/2015 0441   TRIG 106 06/05/2015 0441   HDL 46 06/05/2015 0441   CHOLHDL 3.5 06/05/2015 0441   VLDL 21 06/05/2015 0441   LDLCALC 95 06/05/2015 0441   HgbA1c:  Lab Results  Component Value Date   HGBA1C 8.3* 06/05/2015   Urine Drug Screen: No results found for: LABOPIA, COCAINSCRNUR, LABBENZ, AMPHETMU, THCU, LABBARB    IMAGING  Mr Brain Wo Contrast 06/04/2015   Acute infarcts in the right parietal lobe. Small areas of acute infarct in the right occipital lobe, right posterior temporal lobe, and left parietal white matter. Advanced atrophy.    CT Angio Head and Neck  06/05/2015 1. Negative for emergent large vessel occlusion associated  with the acute right cerebral infarcts. There is a paucity of posterior right MCA M3 branches, but no M2 branch occlusion identified. 2. Complex atherosclerotic plaque at the right ICA origin and bulb with some ulceration. Distal bulb level stenosis which might be hemodynamically significant (estimated at up to 65%). Correlation with right side carotid Doppler ultrasound may be valuable. 3. Previous left carotid surgery. Calcified plaque but no stenosis. 4. 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 5. Dominant appearing left vertebral artery with calcified plaque and mild stenosis at its origin. Otherwise negative posterior circulation. 6. Expected evolution of the larger ischemic foci demonstrated yesterday by MRI.  No associated hemorrhage or mass effect. 7. Patulous thoracic esophagus containing fluid/debris. Consider achalasia. 8. Advanced cervical spine degeneration. Areas of ankylosis in the upper thoracic spine.   LE venous doppler - - No evidence of deep vein or superficial thrombosis involving the  right lower extremity and left lower extremity. - No evidence of Baker&'s cyst on the right or left.  TTE  Study Conclusions - Left ventricle: The cavity size was normal. Wall thickness was  increased in a pattern of mild LVH. Systolic function was  vigorous. The estimated ejection fraction was in the range of 65%  to 70%. Wall motion was normal; there were no regional wall  motion abnormalities. Doppler parameters are consistent with  abnormal left ventricular relaxation (grade 1 diastolic  dysfunction). - Aortic valve: There was no stenosis. - Mitral valve: There was no significant regurgitation. - Right ventricle: The cavity size was normal. Systolic function  was normal. - Tricuspid valve: Peak RV-RA gradient (S): 41 mm Hg. - Pulmonary arteries: PA peak pressure: 44 mm Hg (S). - Inferior vena cava: The vessel was normal in size. The  respirophasic diameter changes were in the normal range (>= 50%),  consistent with normal central venous pressure. Impressions: - Normal LV size with mild LV hypertrophy. EF 65-70%. Normal RV  size and systolic function. Mild pulmonary hypertension.    PHYSICAL EXAM: stable exam today  Temp:  [97.3 F (36.3 C)-98.4 F (36.9 C)] 97.9 F (36.6 C) (05/13 0530) Pulse Rate:  [57-77] 57 (05/13 0530) Resp:  [16-20] 16 (05/13 0530) BP: (143-171)/(67-72) 171/72 mmHg (05/13 0530) SpO2:  [93 %-94 %] 93 % (05/13 0530) Weight:  [74.798 kg (164 lb 14.4 oz)] 74.798 kg (164 lb 14.4 oz) (05/13 0530)  General - Well nourished, well developed, in no apparent distress.  Ophthalmologic - Fundi not visualized due to eye movement.  Cardiovascular - Regular  rate and rhythm.  Mental Status -  Level of arousal and orientation to place, and person were intact, however, not orientated to time, month or year. Language including expression, naming, repetition, comprehension was assessed and found intact. Fund of Knowledge was assessed and was impaired.  Cranial Nerves II - XII - II - Visual field intact OU. III, IV, VI - Extraocular movements intact. V - Facial sensation intact bilaterally. VII - Facial movement intact bilaterally. VIII - Hearing & vestibular intact bilaterally. X - Palate elevates symmetrically. XI - Chin turning & shoulder shrug intact bilaterally. XII - Tongue protrusion intact.  Motor Strength - The patient's strength was normal in all extremities except LUE 4/5 proximal and distal with dexterity difficulty at left hand and pronator drift was absent.  Bulk was normal and fasciculations were absent.   Motor Tone - Muscle tone was assessed at  the neck and appendages and was normal.  Reflexes - The patient's reflexes were 1+ in all extremities and he had no pathological reflexes.  Sensory - Light touch, temperature/pinprick were assessed and were symmetrical.    Coordination - The patient had mild dysmetria at left hand with FTN.  Tremor was absent.  Gait and Station - deferred due to safety concerns.    ASSESSMENT/PLAN Mr. CORGAN BLIGH is a 82 y.o. male with history of COPD, diabetes mellitus, hypertension, renal disease, hyperlipidemia,  previous stroke, pneumonia and respiratory failure presenting with left upper extremity flexion. He did not receive IV t-PA due to unknown time of onset.  Strokes: Bilateral MCAs and watershed infarcts, R>L - hypoperfusion vs. Embolic. Pt did have low BPs at the time of intubation and sedation, with bilateral ICA stenosis, supporting for watershed infarcts, however, cardioembolic source can not be completely ruled out as pt also has non watershed infarcts.   Resultant  Left UE  weakness  MRI - acute infarctions of the right parietal lobe, right occipital lobe, post. temporal lobes, and left parietal lobe.  CTA of head and neck - Complex right ICA stenosis of approximately 65%. Right vertebral artery occluded. We have consulted vascular surgery to evaluate his right carotid.  2D Echo - EF 65-70%. No cardiac source of emboli identified. Mild pulmonary hypertension.  Recommend 30 day cardiac event monitoring to rule out afib  LDL - 95  HgbA1c - 8.3  VTE prophylaxis - subcutaneous heparin Diet Carb Modified Fluid consistency:: Thin; Room service appropriate?: Yes  aspirin 81 mg daily prior to admission, now on aspirin 81 mg daily.   Patient counseled to be compliant with his antithrombotic medications  Ongoing aggressive stroke risk factor management  Therapy recommendations: CIR    Disposition: Pending  Carotid stenosis  S/p bilateral CEA in 1997  CTA head and neck - Complex right ICA stenosis of approximately 65%. Right vertebral artery occluded  Surgical consult pending Dr Oneida Alar  Low BPs on 06/01/15 7pm to 9pm 91/67, 99/58  Avoid hypotension  BP goal 120-150  Hypertension  Stable  Permissive hypertension (OK if < 220/120) but gradually normalize in 5-7 days  Long term BP goal 120-150 due to ICA stenosis  Avoid hypotension  Hyperlipidemia  Home meds:  No lipid lowering medications prior to admission.  LDL 95, goal < 70  Zocor 20 mg daily added  Continue statin at discharge  Diabetes  HgbA1c 8.3, goal < 7.0  Uncontrolled  Other Stroke Risk Factors  Advanced age  Hx of stroke  Other Active Problems  Anemia  Renal insufficiency   PLAN  Surgical consult for right internal carotid artery stenosis placed.   Outpatient 30 day cardiac monitor  Needs better glucose control  Continue statin at discharge  Stroke team will sign off at this time. Please call us for any questions. Patient can follow up with Dr. Erlinda Hong  outpatient in 2 months.     Hospital day # 5   Personally examined patient and images, and have participated in and made any corrections needed to history, physical, neuro exam,assessment and plan as stated above.  I have personally obtained the history, evaluated lab date, reviewed imaging studies and agree with radiology interpretations.    Sarina Ill, MD Stroke Neurology [phone] [facility]       To contact Stroke Continuity provider, please refer to [email or web address] After hours, contact General Neurology

## 2015-06-06 NOTE — Progress Notes (Signed)
PROGRESS NOTE    Chad [name]  [id number] DOB: 1929-08-30 DOA: 06/01/2015 PCP: Hoyt Koch, MD  Outpatient Specialists:   Brief Narrative: 82 year old male past medical history significant for COPD, diabetes, stroke, moderate dementia with behavioral disturbance, and hypertension. Patient was recently admitted to [facility] in February 2017 for sepsis secondary to a left upper lobe pneumonia. He was treated with broad-spectrum antibiotics and IV fluids for hypotension and eventually discharged home. He was recently seen 5/1 the pulmonary clinic for routine follow-up at which time he felt as though his breathing was doing pretty well, however, 5/8 he again presented to the pulmonary clinic with worsening shortness of breath. He was felt to be suffering from COPD exacerbation and was prescribed Augmentin and prednisone taper. As the day progressed he became markedly more dyspneic prompting him to call EMS. Upon their arrival he was found to be unresponsive requiring King airway to be placed for ventilation. He was given Solu-Medrol, albuterol, and 2 g of magnesium by EMS personnel and was promptly intubated on arrival to the emergency department. ABG concerning for profound respiratory acidosis (pH 6.9 with CO2 > 100). Staff also reportedly suctioning copious amounts of food like secretions from ET tube raising concern for aspiration. Patient was transferred to the Hospitalist team after ICU course. Reported to be leaning to the left side. MRI brain revealed multiple areas of infarcts (thought to be water shed areas as per Neurology). Neurology work up is still in progress. CTA brain and Neck result noted, and Vascular surgery consulted.  Assessment & Plan:   Principal Problem:   Acute hypercapnic respiratory failure (HCC) Active Problems:   COPD exacerbation (HCC)   Acute encephalopathy   HCAP (healthcare-associated pneumonia)   COPD with exacerbation (HCC)   Cerebral thrombosis  with cerebral infarction   Acute CVA (cerebrovascular accident) ([place])  - Acute CVA, possibly water shed infarct. Follow Vascular surgery recommendations. Pursue Rehab.  - Acute on chronic hypercarbic respiratory failure secondary to COPD exacerbation: Improved significantly. Continue current regimen.   - Probable HCAP/ LLL atelectasis Complete course of antibiotics.   - Concern for aspiration pneumonitis. -HTN, HLD - Historical. Optimize. - Elevated trop -demand ischemia versus prognostic significance indicating severity of illness.  -  AKI versus AKI on CKD - Stable. -  Acute metabolic encephalopathy - Improved. Baseline dementia noted.   FAMILY -wife   DVT prophylaxis:  Heparin  Family Communication: Wife updated today. Disposition Plan: Home in 1-2 days. DC foley's catheter.  Consultants:   ICU transfer.  Procedures: Intubation and self extubation. Antimicrobials: IV Zosyn.  Subjective: No new complaints. No fever or chills. No SOB or chest pain.  Objective: Filed Vitals:   06/05/15 0801 06/05/15 1100 06/05/15 1221 06/06/15 0530  BP:  143/67 156/69 171/72  Pulse:  77 72 57  Temp:  98.4 F (36.9 C) 97.3 F (36.3 C) 97.9 F (36.6 C)  TempSrc:  Oral Oral Oral  Resp:  20 18 16   Height:      Weight:    74.798 kg (164 lb 14.4 oz)  SpO2: 98% 94% 94% 93%    Intake/Output Summary (Last 24 hours) at 06/06/15 0724 Last data filed at 06/05/15 2107  Gross per 24 hour  Intake    980 ml  Output    900 ml  Net     80 ml   Filed Weights   06/04/15 0452 06/05/15 0714 06/06/15 0530  Weight: 75.705 kg (166 lb 14.4 oz) 74.345 kg (163  lb 14.4 oz) 74.798 kg (164 lb 14.4 oz)    Examination:  General exam: Not in distress.  Respiratory system: Good air entry. Cardiovascular system: S1 & S2. Gastrointestinal system: Abdomen is nondistended, soft and nontender.   Central nervous system: Awake and Alert. Very minimal LUE weakness elicited. Extremities: No edema.  Data  Reviewed: I have personally reviewed following labs and imaging studies  CBC:  Recent Labs Lab 06/01/15 1844 06/01/15 1851 06/02/15 0200 06/03/15 0222 06/05/15 0441  WBC 18.9*  --  13.3* 16.8* 8.8  NEUTROABS 6.0  --   --   --  5.8  HGB 12.9* 15.0 10.9* 10.6* 10.8*  HCT 42.3 44.0 35.6* 33.8* 34.2*  MCV 89.2  --  84.0 82.6 83.8  PLT 298  --  190 207 [id number]   Basic Metabolic Panel:  Recent Labs Lab 06/01/15 1844 06/01/15 1851 06/02/15 0200 06/03/15 0222 06/05/15 0441  NA 135 137 136 142 142  K 5.0 5.1 4.3 3.8 3.7  CL 101 106 102 109 105  CO2 17*  --  16* 20* 25  GLUCOSE 306* 292* 346* 214* 133*  BUN 22* 29* 25* 24* 22*  CREATININE 1.70* 1.40* 1.49* 1.38* 1.48*  CALCIUM 10.1  --  8.5* 8.6* 9.1  MG 3.7*  --  2.0  --   --   PHOS 9.0*  --  1.6*  --  4.0   GFR: Estimated Creatinine Clearance: 37.8 mL/min (by C-G formula based on Cr of 1.48). Liver Function Tests:  Recent Labs Lab 06/01/15 1844 06/05/15 0441  AST 33  --   ALT 22  --   ALKPHOS 88  --   BILITOT 0.4  --   PROT 7.3  --   ALBUMIN 4.1 3.0*    Recent Labs Lab 06/01/15 2344  LIPASE 26   No results for input(s): AMMONIA in the last 168 hours. Coagulation Profile:  Recent Labs Lab 06/01/15 1844  INR 1.21   Cardiac Enzymes:  Recent Labs Lab 06/01/15 1844 06/01/15 2119 06/02/15 0200 06/02/15 0733 06/02/15 1040  TROPONINI <0.03 0.16* 0.33* 0.67* 0.63*   BNP (last 3 results) No results for input(s): PROBNP in the last 8760 hours. HbA1C:  Recent Labs  06/05/15 0441  HGBA1C 8.3*   CBG:  Recent Labs Lab 06/05/15 0531 06/05/15 1107 06/05/15 1545 06/05/15 2115 06/06/15 0625  GLUCAP 147* 313* 318* 161* 146*   Lipid Profile:  Recent Labs  06/05/15 0441  CHOL 162  HDL 46  LDLCALC 95  TRIG 106  CHOLHDL 3.5   Thyroid Function Tests:  Recent Labs  06/05/15 0441  TSH 1.326   Anemia Panel:  Recent Labs  06/05/15 0441  VITAMINB12 2099*   Urine analysis:    Component  Value Date/Time   COLORURINE YELLOW 06/01/2015 1923   APPEARANCEUR CLOUDY* 06/01/2015 1923   LABSPEC 1.018 06/01/2015 1923   PHURINE 6.0 06/01/2015 1923   GLUCOSEU 500* 06/01/2015 1923   HGBUR SMALL* 06/01/2015 [place] NEGATIVE 06/01/2015 [place] NEGATIVE 06/01/2015 1923   PROTEINUR 100* 06/01/2015 1923   NITRITE NEGATIVE 06/01/2015 1923   LEUKOCYTESUR NEGATIVE 06/01/2015 1923   Sepsis Labs: @LABRCNTIP (procalcitonin:4,lacticidven:4)  ) Recent Results (from the past 240 hour(s))  Blood Culture (routine x 2)     Status: None (Preliminary result)   Collection Time: 06/01/15  6:35 PM  Result Value Ref Range Status   Specimen Description BLOOD RIGHT ANTECUBITAL  Final   Special Requests BOTTLES DRAWN AEROBIC ONLY 5CC  Final   Culture NO GROWTH 4 DAYS  Final   Report Status PENDING  Incomplete  Blood Culture (routine x 2)     Status: None (Preliminary result)   Collection Time: 06/01/15  6:40 PM  Result Value Ref Range Status   Specimen Description BLOOD LEFT ANTECUBITAL  Final   Special Requests BOTTLES DRAWN AEROBIC ONLY 5CC  Final   Culture NO GROWTH 4 DAYS  Final   Report Status PENDING  Incomplete  Urine culture     Status: None   Collection Time: 06/01/15  7:10 PM  Result Value Ref Range Status   Specimen Description URINE, CATHETERIZED  Final   Special Requests NONE  Final   Culture NO GROWTH 2 DAYS  Final   Report Status 06/03/2015 FINAL  Final  MRSA PCR Screening     Status: None   Collection Time: 06/01/15  9:49 PM  Result Value Ref Range Status   MRSA by PCR NEGATIVE NEGATIVE Final    Comment:        The GeneXpert MRSA Assay (FDA approved for NASAL specimens only), is one component of a comprehensive MRSA colonization surveillance program. It is not intended to diagnose MRSA infection nor to guide or monitor treatment for MRSA infections.   Culture, bal-quantitative     Status: None (Preliminary result)   Collection Time: 06/02/15  2:20  PM  Result Value Ref Range Status   Specimen Description BRONCHIAL ALVEOLAR LAVAGE  Final   Special Requests NONE  Final   Gram Stain   Final    ABUNDANT WBC PRESENT, PREDOMINANTLY PMN NO SQUAMOUS EPITHELIAL CELLS SEEN FEW YEAST Performed at [facility]    Culture   Final    MODERATE PSEUDOMONAS AERUGINOSA YEAST CONSISTENT WITH CANDIDA SPECIES Performed at [facility]    Report Status PENDING  Incomplete         Radiology Studies: Ct Angio Head W/cm &/or Wo Cm  06/05/2015  CLINICAL DATA:  82 year old male with symptoms of new onset left side weakness following admission for respiratory failure requiring intubation. Found have patchy right MCA and PCA territory infarcts on MRI. Initial encounter. EXAM: CT ANGIOGRAPHY HEAD AND NECK TECHNIQUE: Multidetector CT imaging of the head and neck was performed using the standard protocol during bolus administration of intravenous contrast. Multiplanar CT image reconstructions and MIPs were obtained to evaluate the vascular anatomy. Carotid stenosis measurements (when applicable) are obtained utilizing NASCET criteria, using the distal internal carotid diameter as the denominator. CONTRAST:  50 mL Isovue 370 COMPARISON:  Brain MRI 06/04/2015. Head CT without contrast 05/15/2014 and earlier. CT Abdomen and Pelvis 05/31/2010 FINDINGS: CT HEAD Brain: No acute intracranial hemorrhage identified. Cortically based infarct evident at the right superior frontal gyrus on series 7, image 25. No intracranial mass effect. There is also increased hypodensity in the right caudate on image 17. Other recently depicted small areas of ischemia remain occult on CT. Stable gray-white matter differentiation elsewhere. No ventriculomegaly. Calvarium and skull base: Stable. No acute osseous abnormality identified. Paranasal sinuses: Right frontal sinus opacification and mucosal thickening is stable. Other visualized sinuses and mastoids are clear. Orbits:  Stable, postoperative changes to the globes right greater than left. No acute scalp soft tissue findings. CTA NECK Skeleton: Advanced cervical spine degeneration. There is ankylosis in some of the visualized upper thoracic spine. Incidental spina bifida occulta at T1. No acute osseous abnormality identified. Other neck: Dilated and patulous thoracic esophagus. In 2012 gastric hiatal hernia was demonstrated, but  the distal esophagus was not visible. No superior mediastinal lymphadenopathy. Mild anterior upper lobe scarring and paraseptal emphysema greater on the left. Negative thyroid. The glottis is closed. Motion artifact through the hypopharynx and oropharynx. Negative nasopharynx. Negative superior parapharyngeal spaces. Sublingual space, submandibular glands and parotid glands appear normal. No cervical lymphadenopathy. Aortic arch: 3 vessel arch configuration with mild to moderate calcified arch and great vessel atherosclerosis. Right carotid system: No brachiocephalic artery or right CCA origin stenosis. Mildly tortuous right CCA with occasional calcified plaque. Soft and calcified plaque in the right ICA origin and bulb. Complex atherosclerosis in the distal bulb which appears to be partially ulcerated. See series 5, images 92 and 93. There might be stenosis here up to 65 % with respect to the distal vessel (series 13, image 53). Distal to this level the cervical right ICA is normal. Left carotid system: No left CCA origin stenosis. Mildly tortuous left CCA with occasional calcified plaque proximal to the bifurcation. Bulky calcified plaque at the left ICA origin and bulb, but no stenosis results (series 12, image 119). Patulous bulb compatible with previous left carotid surgery. Negative cervical left ICA distal to the bulb. Vertebral arteries:No proximal right subclavian artery stenosis despite calcified plaque. The right vertebral artery is occluded at its origin with some calcified plaque, series 12,  image 125. There is faint reconstituted enhancement in the distal right V2 segment beginning at C3. The vessel has a more normal albeit non dominant appearance at the C1 level and is patent as it crosses the dura. No proximal left subclavian artery stenosis despite soft and calcified plaque. Calcified plaque at the left vertebral artery origin but only mild stenosis results. Dominant left vertebral artery with mild tortuosity in the neck and no other atherosclerosis. CTA HEAD Posterior circulation: Dominant left vertebral artery without stenosis in the posterior fossa. Patent vertebrobasilar junction. Diminutive but patent right V4 segment. Right PICA origin is patent. Left PICA origin is diminutive. No basilar artery stenosis. Normal SCA and PCA origins. Posterior communicating arteries are diminutive or absent. Bilateral PCA branches are within normal limits. Anterior circulation: Both ICA siphons are patent. Minimal calcified plaque on the left with no stenosis. Normal left ophthalmic artery origin. Mild calcified plaque and dolichoectasia on the right with no stenosis. Normal right ophthalmic artery origin. Patent carotid termini. Normal MCA and ACA origins. Tortuous A1 segments. Normal anterior communicating artery. Bilateral ACA branches are mildly irregular but otherwise normal (series 16, image 18). Left MCA M1 segment, bifurcation, and left MCA branches are within normal limits. Right MCA M1 segment, bifurcation, and right MCA bifurcation are patent and within normal limits. There seem to be a paucity of posterior most right M3 branches, but no proximal M2 occlusion is identified. Venous sinuses: Patent. Anatomic variants: Dominant appearing left vertebral artery. Delayed phase: No abnormal enhancement identified. IMPRESSION: 1. Negative for emergent large vessel occlusion associated with the acute right cerebral infarcts. There is a paucity of posterior right MCA M3 branches, but no M2 branch occlusion  identified. 2. Complex atherosclerotic plaque at the right ICA origin and bulb with some ulceration. Distal bulb level stenosis which might be hemodynamically significant (estimated at up to 65%). Correlation with right side carotid Doppler ultrasound may be valuable. 3. Previous left carotid surgery.  Calcified plaque but no stenosis. 4. 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 5. Dominant appearing left vertebral artery with calcified plaque  and mild stenosis at its origin. Otherwise negative posterior circulation. 6. Expected evolution of the larger ischemic foci demonstrated yesterday by MRI. No associated hemorrhage or mass effect. 7. Patulous thoracic esophagus containing fluid/debris. Consider achalasia. 8. Advanced cervical spine degeneration. Areas of ankylosis in the upper thoracic spine. Electronically Signed   By: Genevie Ann M.D.   On: 06/05/2015 14:59   Ct Angio Neck W/cm &/or Wo/cm  06/05/2015  CLINICAL DATA:  82 year old male with symptoms of new onset left side weakness following admission for respiratory failure requiring intubation. Found have patchy right MCA and PCA territory infarcts on MRI. Initial encounter. EXAM: CT ANGIOGRAPHY HEAD AND NECK TECHNIQUE: Multidetector CT imaging of the head and neck was performed using the standard protocol during bolus administration of intravenous contrast. Multiplanar CT image reconstructions and MIPs were obtained to evaluate the vascular anatomy. Carotid stenosis measurements (when applicable) are obtained utilizing NASCET criteria, using the distal internal carotid diameter as the denominator. CONTRAST:  50 mL Isovue 370 COMPARISON:  Brain MRI 06/04/2015. Head CT without contrast 05/15/2014 and earlier. CT Abdomen and Pelvis 05/31/2010 FINDINGS: CT HEAD Brain: No acute intracranial hemorrhage identified. Cortically based infarct evident at the  right superior frontal gyrus on series 7, image 25. No intracranial mass effect. There is also increased hypodensity in the right caudate on image 17. Other recently depicted small areas of ischemia remain occult on CT. Stable gray-white matter differentiation elsewhere. No ventriculomegaly. Calvarium and skull base: Stable. No acute osseous abnormality identified. Paranasal sinuses: Right frontal sinus opacification and mucosal thickening is stable. Other visualized sinuses and mastoids are clear. Orbits: Stable, postoperative changes to the globes right greater than left. No acute scalp soft tissue findings. CTA NECK Skeleton: Advanced cervical spine degeneration. There is ankylosis in some of the visualized upper thoracic spine. Incidental spina bifida occulta at T1. No acute osseous abnormality identified. Other neck: Dilated and patulous thoracic esophagus. In 2012 gastric hiatal hernia was demonstrated, but the distal esophagus was not visible. No superior mediastinal lymphadenopathy. Mild anterior upper lobe scarring and paraseptal emphysema greater on the left. Negative thyroid. The glottis is closed. Motion artifact through the hypopharynx and oropharynx. Negative nasopharynx. Negative superior parapharyngeal spaces. Sublingual space, submandibular glands and parotid glands appear normal. No cervical lymphadenopathy. Aortic arch: 3 vessel arch configuration with mild to moderate calcified arch and great vessel atherosclerosis. Right carotid system: No brachiocephalic artery or right CCA origin stenosis. Mildly tortuous right CCA with occasional calcified plaque. Soft and calcified plaque in the right ICA origin and bulb. Complex atherosclerosis in the distal bulb which appears to be partially ulcerated. See series 5, images 92 and 93. There might be stenosis here up to 65 % with respect to the distal vessel (series 13, image 53). Distal to this level the cervical right ICA is normal. Left carotid system: No  left CCA origin stenosis. Mildly tortuous left CCA with occasional calcified plaque proximal to the bifurcation. Bulky calcified plaque at the left ICA origin and bulb, but no stenosis results (series 12, image 119). Patulous bulb compatible with previous left carotid surgery. Negative cervical left ICA distal to the bulb. Vertebral arteries:No proximal right subclavian artery stenosis despite calcified plaque. The right vertebral artery is occluded at its origin with some calcified plaque, series 12, image 125. There is faint reconstituted enhancement in the distal right V2 segment beginning at C3. The vessel has a more normal albeit non dominant appearance at the C1 level and is patent as it crosses  the dura. No proximal left subclavian artery stenosis despite soft and calcified plaque. Calcified plaque at the left vertebral artery origin but only mild stenosis results. Dominant left vertebral artery with mild tortuosity in the neck and no other atherosclerosis. CTA HEAD Posterior circulation: Dominant left vertebral artery without stenosis in the posterior fossa. Patent vertebrobasilar junction. Diminutive but patent right V4 segment. Right PICA origin is patent. Left PICA origin is diminutive. No basilar artery stenosis. Normal SCA and PCA origins. Posterior communicating arteries are diminutive or absent. Bilateral PCA branches are within normal limits. Anterior circulation: Both ICA siphons are patent. Minimal calcified plaque on the left with no stenosis. Normal left ophthalmic artery origin. Mild calcified plaque and dolichoectasia on the right with no stenosis. Normal right ophthalmic artery origin. Patent carotid termini. Normal MCA and ACA origins. Tortuous A1 segments. Normal anterior communicating artery. Bilateral ACA branches are mildly irregular but otherwise normal (series 16, image 18). Left MCA M1 segment, bifurcation, and left MCA branches are within normal limits. Right MCA M1 segment,  bifurcation, and right MCA bifurcation are patent and within normal limits. There seem to be a paucity of posterior most right M3 branches, but no proximal M2 occlusion is identified. Venous sinuses: Patent. Anatomic variants: Dominant appearing left vertebral artery. Delayed phase: No abnormal enhancement identified. IMPRESSION: 1. Negative for emergent large vessel occlusion associated with the acute right cerebral infarcts. There is a paucity of posterior right MCA M3 branches, but no M2 branch occlusion identified. 2. Complex atherosclerotic plaque at the right ICA origin and bulb with some ulceration. Distal bulb level stenosis which might be hemodynamically significant (estimated at up to 65%). Correlation with right side carotid Doppler ultrasound may be valuable. 3. Previous left carotid surgery.  Calcified plaque but no stenosis. 4. 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 5. Dominant appearing left vertebral artery with calcified plaque and mild stenosis at its origin. Otherwise negative posterior circulation. 6. Expected evolution of the larger ischemic foci demonstrated yesterday by MRI. No associated hemorrhage or mass effect. 7. Patulous thoracic esophagus containing fluid/debris. Consider achalasia. 8. Advanced cervical spine degeneration. Areas of ankylosis in the upper thoracic spine. Electronically Signed   By: Genevie Ann M.D.   On: 06/05/2015 14:59   Mr Brain Wo Contrast  06/04/2015  CLINICAL DATA:  Stroke.  Hypertension diabetes COPD EXAM: MRI HEAD WITHOUT CONTRAST TECHNIQUE: Multiplanar, multiecho pulse sequences of the brain and surrounding structures were obtained without intravenous contrast. COMPARISON:  None. FINDINGS: Acute infarct in the right corona radiata white matter. Acute infarct extends into the right parietal cortex and right posterior temporal lobe. Acute infarct in the  right occipital lobe . Small area of acute infarct in the left parietal periventricular white matter. Moderate to advanced atrophy. Mild chronic microvascular ischemic change in the white matter. Brainstem and cerebellum intact. Negative for intracranial hemorrhage. Negative for mass or edema.  No shift of the midline structures. Paranasal sinuses clear. Normal orbit. Pituitary normal in size. Skullbase intact. IMPRESSION: Acute infarcts in the right parietal lobe. Small areas of acute infarct in the right occipital lobe, right posterior temporal lobe, and left parietal white matter. Advanced atrophy. Electronically Signed   By: Franchot Gallo M.D.   On: 06/04/2015 19:52        Scheduled Meds: . amLODipine  5 mg Oral Daily  . aspirin EC  81 mg Oral Daily  . brimonidine  1 drop Left Eye BID  . cholecalciferol  1,000 Units Oral Daily  . donepezil  10 mg Oral QHS  . feeding supplement (GLUCERNA SHAKE)  237 mL Oral BID BM  . heparin  5,000 Units Subcutaneous Q8H  . insulin aspart  0-5 Units Subcutaneous QHS  . insulin aspart  0-9 Units Subcutaneous TID WC  . insulin glargine  10 Units Subcutaneous Daily  . losartan  100 mg Oral Daily  . methylPREDNISolone (SOLU-MEDROL) injection  40 mg Intravenous Q24H  . mirtazapine  7.5 mg Oral QHS  . multivitamin with minerals  1 tablet Oral Daily  . piperacillin-tazobactam (ZOSYN)  IV  3.375 g Intravenous Q8H  . simvastatin  20 mg Oral q1800  . vitamin B-12  1,000 mcg Oral Daily   Continuous Infusions: . sodium chloride 50 mL/hr at 06/03/15 1108     LOS: 5 days    Time spent: [place] [facility]    Bonnell Public, MD [facility] Pager [phone].  If 7PM-7AM, please contact night-coverage [email or web address] Password [facility] 06/06/2015, 7:24 AM

## 2015-06-06 NOTE — Consult Note (Signed)
Referring Physician: Heide Spark PA Patient name: Chad [name] MRN: [id number] DOB: 05-Jul-1929 Sex: male  REASON FOR CONSULT: stroke with recurrent carotid stenosis  HPI: FABIAN ROS is a 82 y.o. male,  Recently admitted with pneumonia.  Had stroke while in ventilator resulting in left side weakness.  He had prior bilateral CEA by Dr Donnetta Hutching in 1998.  No neuro events recently.  Still very deconditioned with some confusion.  Most of history from wife. Other medical problems include diabetes, COPD, hypertension and elevated cholesterol all stable.  Some renal dysfunction creatinine around 1.5.  On aspirin and statin.  Past Medical History  Diagnosis Date  . Diabetes mellitus without complication ([place])   . COPD (chronic obstructive pulmonary disease) ([place])   . Stroke ([place])   . Hypertension   . Glaucoma   . Diverticulitis   . GERD (gastroesophageal reflux disease)   . Arthritis   . High cholesterol   . Renal disorder    Past Surgical History  Procedure Laterality Date  . Knee surgery    . Carotid artery angioplasty    . Shoulder surgery    . Basal cell carcinoma excision    . Eye surgery      History reviewed. No pertinent family history.  SOCIAL HISTORY: Social History   Social History  . Marital Status: Married    Spouse Name: N/A  . Number of Children: N/A  . Years of Education: N/A   Occupational History  . Not on file.   Social History Main Topics  . Smoking status: Not on file  . Smokeless tobacco: Not on file  . Alcohol Use: Not on file  . Drug Use: Not on file  . Sexual Activity: Not on file   Other Topics Concern  . Not on file   Social History Narrative    Allergies  Allergen Reactions  . Avelox [Moxifloxacin Hcl In Nacl] Anaphylaxis  . Timoptic [Timolol Maleate] Other (See Comments)    Congestion  . Sulfa Antibiotics Rash    Current Facility-Administered Medications  Medication Dose Route Frequency Provider Last Rate Last Dose  .  0.9 %  sodium chloride infusion  250 mL Intravenous PRN Corey Harold, NP      . 0.9 %  sodium chloride infusion   Intravenous Continuous Rigoberto Noel, MD 50 mL/hr at 06/03/15 1108    . albuterol (PROVENTIL) (2.5 MG/3ML) 0.083% nebulizer solution 2.5 mg  2.5 mg Nebulization Q2H PRN Corey Harold, NP   2.5 mg at 06/06/15 1041  . amLODipine (NORVASC) tablet 5 mg  5 mg Oral Daily Kara Mead V, MD   5 mg at 06/06/15 1032  . aspirin EC tablet 81 mg  81 mg Oral Daily Chesley Mires, MD   81 mg at 06/06/15 1032  . brimonidine (ALPHAGAN) 0.15 % ophthalmic solution 1 drop  1 drop Left Eye BID Rigoberto Noel, MD   1 drop at 06/06/15 1031  . cholecalciferol (VITAMIN D) tablet 1,000 Units  1,000 Units Oral Daily Chesley Mires, MD   1,000 Units at 06/06/15 1032  . donepezil (ARICEPT) tablet 10 mg  10 mg Oral QHS Chesley Mires, MD   10 mg at 06/05/15 2106  . feeding supplement (GLUCERNA SHAKE) (GLUCERNA SHAKE) liquid 237 mL  237 mL Oral BID BM Bonnell Public, MD   237 mL at 06/06/15 1039  . fluticasone (FLONASE) 50 MCG/ACT nasal spray 2 spray  2 spray Each Nare BID PRN Vineet  Sood, MD      . heparin injection 5,000 Units  5,000 Units Subcutaneous Q8H Corey Harold, NP   5,000 Units at 06/06/15 [id number]  . hydrALAZINE (APRESOLINE) injection 10-20 mg  10-20 mg Intravenous Q4H PRN Rigoberto Noel, MD   10 mg at 06/06/15 0532  . insulin aspart (novoLOG) injection 0-5 Units  0-5 Units Subcutaneous QHS Bonnell Public, MD   0 Units at 06/05/15 2159  . insulin aspart (novoLOG) injection 0-9 Units  0-9 Units Subcutaneous TID WC Bonnell Public, MD   1 Units at 06/06/15 0654  . insulin glargine (LANTUS) injection 10 Units  10 Units Subcutaneous Daily Rigoberto Noel, MD   10 Units at 06/06/15 1039  . losartan (COZAAR) tablet 100 mg  100 mg Oral Daily Chesley Mires, MD   100 mg at 06/06/15 1032  . methylPREDNISolone sodium succinate (SOLU-MEDROL) 40 mg/mL injection 40 mg  40 mg Intravenous Q24H Kara Mead V, MD   40 mg at  06/06/15 0740  . mirtazapine (REMERON) tablet 7.5 mg  7.5 mg Oral QHS Bonnell Public, MD   7.5 mg at 06/05/15 2106  . multivitamin with minerals tablet 1 tablet  1 tablet Oral Daily Bonnell Public, MD   1 tablet at 06/06/15 1031  . piperacillin-tazobactam (ZOSYN) IVPB 3.375 g  3.375 g Intravenous Q8H Rebecka Apley, RPH   3.375 g at 06/06/15 0527  . simvastatin (ZOCOR) tablet 20 mg  20 mg Oral q1800 David L Rinehuls, PA-C   20 mg at 06/05/15 1824  . traZODone (DESYREL) tablet 50 mg  50 mg Oral QHS PRN Chesley Mires, MD   50 mg at 06/04/15 2210  . vitamin B-12 (CYANOCOBALAMIN) tablet 1,000 mcg  1,000 mcg Oral Daily Chesley Mires, MD   1,000 mcg at 06/06/15 1032    ROS:   General:  No weight loss, Fever, chills  HEENT: No recent headaches, no nasal bleeding, no visual changes, no sore throat  Neurologic: No dizziness, blackouts, seizures. No recent symptoms of stroke or mini- stroke. No recent episodes of slurred speech, or temporary blindness.  Cardiac: No recent episodes of chest pain/pressure, no shortness of breath at rest.  + shortness of breath with exertion.  Denies history of atrial fibrillation or irregular heartbeat  Vascular: No history of rest pain in feet.  No history of claudication.  No history of non-healing ulcer, No history of DVT   Pulmonary: No home oxygen, no productive cough, no hemoptysis,  No asthma or wheezing  Musculoskeletal:  [ ]  Arthritis, [ ]  Low back pain,  [ ]  Joint pain  Hematologic:No history of hypercoagulable state.  No history of easy bleeding.  No history of anemia  Gastrointestinal: No hematochezia or melena,  No gastroesophageal reflux, no trouble swallowing  Urinary: [ ]  chronic Kidney disease, [ ]  on HD - [ ]  MWF or [ ]  TTHS, [ ]  Burning with urination, [ ]  Frequent urination, [ ]  Difficulty urinating;   Skin: No rashes  Psychological: No history of anxiety,  No history of depression   Physical Examination  Filed Vitals:   06/05/15 1221  06/06/15 0530 06/06/15 1029 06/06/15 1042  BP: 156/69 171/72 138/58   Pulse: 72 57 72   Temp: 97.3 F (36.3 C) 97.9 F (36.6 C) 97.8 F (36.6 C)   TempSrc: Oral Oral Oral   Resp: 18 16 18    Height:      Weight:  164 lb 14.4 oz (74.798 kg)  SpO2: 94% 93% 94% 94%    Body mass index is 24.34 kg/(m^2).  General:  Alert and oriented, no acute distress, but seems a little confused and distracted HEENT: Normal Neck: 2+ carotids with well healed scars bilateral neck consistent with CEA Pulmonary: coarse BS bilaterally with productive cough Cardiac: Regular Rate and Rhythm  Skin: No rash Musculoskeletal: No deformity or edema  Neurologic: Upper and lower extremity motor 5/5 right side, 4/5 subtle weakness and clumsiness left arm/leg primarily with extension   DATA:  CTA images reviewed irregular plaque right carotid bifurcation.   Duplex <39% bilaterally   MRI scattered infarcts posterior and middle portions of brain right side   BMET    Component Value Date/Time   NA 142 06/05/2015 0441   K 3.7 06/05/2015 0441   CL 105 06/05/2015 0441   CO2 25 06/05/2015 0441   GLUCOSE 133* 06/05/2015 0441   BUN 22* 06/05/2015 0441   CREATININE 1.48* 06/05/2015 0441   CALCIUM 9.1 06/05/2015 0441   GFRNONAA 42* 06/05/2015 0441   GFRAA 49* 06/05/2015 0441    CBC    Component Value Date/Time   WBC 8.8 06/05/2015 0441   RBC 4.08* 06/05/2015 0441   HGB 10.8* 06/05/2015 0441   HCT 34.2* 06/05/2015 0441   PLT 199 06/05/2015 0441   MCV 83.8 06/05/2015 0441   MCH 26.5 06/05/2015 0441   MCHC 31.6 06/05/2015 0441   RDW 16.2* 06/05/2015 0441   LYMPHSABS 2.1 06/05/2015 0441   MONOABS 0.8 06/05/2015 0441   EOSABS 0.0 06/05/2015 0441   BASOSABS 0.0 06/05/2015 0441      ASSESSMENT:  Pt with recent stroke embolic vs watershed discrepant duplex and CTA.  Would continue medical management for now and let recover from pneumonia.   PLAN:  Will arrange for follow up with Dr Donnetta Hutching in 2-4  weeks to see if medical treatment or eval further with carotid angio   Ruta Hinds, MD [facility]: [phone] Pager: [phone]

## 2015-06-06 NOTE — Consult Note (Signed)
Pt off floor for testing Will recheck tomorrow. Spoke with pt wife.  Had bilateral CEA done by my partner Dr Donnetta Hutching 1997. Most likely patient is going to need several weeks of recovery from pneumonia prior to considering CEA Will check back tomorrow  Ruta Hinds, MD [facility] [place]: [phone] Pager: [phone]

## 2015-06-07 DIAGNOSIS — I6330 Cerebral infarction due to thrombosis of unspecified cerebral artery: Secondary | ICD-10-CM

## 2015-06-07 DIAGNOSIS — I6521 Occlusion and stenosis of right carotid artery: Secondary | ICD-10-CM

## 2015-06-07 LAB — CULTURE, BAL-QUANTITATIVE

## 2015-06-07 LAB — CULTURE, BAL-QUANTITATIVE W GRAM STAIN

## 2015-06-07 LAB — GLUCOSE, CAPILLARY
GLUCOSE-CAPILLARY: 215 mg/dL — AB (ref 65–99)
Glucose-Capillary: 168 mg/dL — ABNORMAL HIGH (ref 65–99)
Glucose-Capillary: 289 mg/dL — ABNORMAL HIGH (ref 65–99)
Glucose-Capillary: 229 mg/dL — ABNORMAL HIGH (ref 65–99)

## 2015-06-07 NOTE — Progress Notes (Signed)
PROGRESS NOTE    Chad [name]  [id number] DOB: [birth year] DOA: 06/01/2015 PCP: Hoyt Koch, MD  Outpatient Specialists:   Brief Narrative: 82 year old male past medical history significant for COPD, diabetes, stroke, moderate dementia with behavioral disturbance, and hypertension. Patient was recently admitted to [facility] in February 2017 for sepsis secondary to a left upper lobe pneumonia. He was treated with broad-spectrum antibiotics and IV fluids for hypotension and eventually discharged home. He was recently seen 5/1 the pulmonary clinic for routine follow-up at which time he felt as though his breathing was doing pretty well, however, 5/8 he again presented to the pulmonary clinic with worsening shortness of breath. He was felt to be suffering from COPD exacerbation and was prescribed Augmentin and prednisone taper. As the day progressed he became markedly more dyspneic prompting him to call EMS. Upon their arrival he was found to be unresponsive requiring King airway to be placed for ventilation. He was given Solu-Medrol, albuterol, and 2 g of magnesium by EMS personnel and was promptly intubated on arrival to the emergency department. ABG concerning for profound respiratory acidosis (pH 6.9 with CO2 > 100). Staff also reportedly suctioning copious amounts of food like secretions from ET tube raising concern for aspiration. Patient was transferred to the Hospitalist team after ICU course. Reported to be leaning to the left side. MRI brain revealed multiple areas of infarcts (thought to be water shed areas as per Neurology). Neurology work up is still in progress. CTA brain and Neck result noted, and Vascular surgery consulted.  Assessment & Plan:   Principal Problem:   Acute hypercapnic respiratory failure (HCC) Active Problems:   COPD exacerbation (HCC)   Acute encephalopathy   HCAP (healthcare-associated pneumonia)   COPD with exacerbation (HCC)   Cerebral thrombosis  with cerebral infarction   Acute CVA (cerebrovascular accident) ([place])  - Acute CVA: Pursue Rehab.  - Acute on chronic hypercarbic respiratory failure secondary to COPD exacerbation: Resolved significantly. Continue current regimen.   - Probable HCAP/ LLL atelectasis Complete course of antibiotics.   - Concern for aspiration pneumonitis. -HTN, HLD - Historical. Optimize. - Elevated trop -demand ischemia versus prognostic significance indicating severity of illness.  -  AKI versus AKI on CKD - Stable. -  Acute metabolic encephalopathy - Improved. Baseline dementia noted.   FAMILY -wife   DVT prophylaxis:  Heparin  Family Communication: Wife updated today. Disposition Plan: Home in 1-2 days. DC foley's catheter.  Consultants:   ICU transfer.  Procedures: Intubation and self extubation. Antimicrobials: IV Zosyn.  Subjective: No new complaints. No fever or chills. No SOB or chest pain.  Objective: Filed Vitals:   06/06/15 2057 06/07/15 0500 06/07/15 0629 06/07/15 1007  BP: 165/65  160/78 140/91  Pulse: 62  63   Temp: 97.9 F (36.6 C)  97.9 F (36.6 C)   TempSrc: Oral  Oral   Resp: 18  18   Height:      Weight:  73.528 kg (162 lb 1.6 oz)    SpO2: 97%  100%     Intake/Output Summary (Last 24 hours) at 06/07/15 1008 Last data filed at 06/07/15 0906  Gross per 24 hour  Intake 1813.34 ml  Output   1800 ml  Net  13.34 ml   Filed Weights   06/05/15 0714 06/06/15 0530 06/07/15 0500  Weight: 74.345 kg (163 lb 14.4 oz) 74.798 kg (164 lb 14.4 oz) 73.528 kg (162 lb 1.6 oz)    Examination:  General exam: Not in  distress.  Respiratory system: Good air entry. Cardiovascular system: S1 & S2. Gastrointestinal system: Abdomen is nondistended, soft and nontender.   Central nervous system: Awake and Alert. Very minimal LUE weakness elicited. Extremities: No edema.  Data Reviewed: I have personally reviewed following labs and imaging studies  CBC:  Recent Labs Lab  06/01/15 1844 06/01/15 1851 06/02/15 0200 06/03/15 0222 06/05/15 0441  WBC 18.9*  --  13.3* 16.8* 8.8  NEUTROABS 6.0  --   --   --  5.8  HGB 12.9* 15.0 10.9* 10.6* 10.8*  HCT 42.3 44.0 35.6* 33.8* 34.2*  MCV 89.2  --  84.0 82.6 83.8  PLT 298  --  190 207 [id number]   Basic Metabolic Panel:  Recent Labs Lab 06/01/15 1844 06/01/15 1851 06/02/15 0200 06/03/15 0222 06/05/15 0441  NA 135 137 136 142 142  K 5.0 5.1 4.3 3.8 3.7  CL 101 106 102 109 105  CO2 17*  --  16* 20* 25  GLUCOSE 306* 292* 346* 214* 133*  BUN 22* 29* 25* 24* 22*  CREATININE 1.70* 1.40* 1.49* 1.38* 1.48*  CALCIUM 10.1  --  8.5* 8.6* 9.1  MG 3.7*  --  2.0  --   --   PHOS 9.0*  --  1.6*  --  4.0   GFR: Estimated Creatinine Clearance: 37.8 mL/min (by C-G formula based on Cr of 1.48). Liver Function Tests:  Recent Labs Lab 06/01/15 1844 06/05/15 0441  AST 33  --   ALT 22  --   ALKPHOS 88  --   BILITOT 0.4  --   PROT 7.3  --   ALBUMIN 4.1 3.0*    Recent Labs Lab 06/01/15 2344  LIPASE 26   No results for input(s): AMMONIA in the last 168 hours. Coagulation Profile:  Recent Labs Lab 06/01/15 1844  INR 1.21   Cardiac Enzymes:  Recent Labs Lab 06/01/15 1844 06/01/15 2119 06/02/15 0200 06/02/15 0733 06/02/15 1040  TROPONINI <0.03 0.16* 0.33* 0.67* 0.63*   BNP (last 3 results) No results for input(s): PROBNP in the last 8760 hours. HbA1C:  Recent Labs  06/05/15 0441  HGBA1C 8.3*   CBG:  Recent Labs Lab 06/06/15 0625 06/06/15 1057 06/06/15 1632 06/06/15 2141 06/07/15 0606  GLUCAP 146* 237* 266* 172* 168*   Lipid Profile:  Recent Labs  06/05/15 0441  CHOL 162  HDL 46  LDLCALC 95  TRIG 106  CHOLHDL 3.5   Thyroid Function Tests:  Recent Labs  06/05/15 0441  TSH 1.326   Anemia Panel:  Recent Labs  06/05/15 0441  VITAMINB12 2099*   Urine analysis:    Component Value Date/Time   COLORURINE YELLOW 06/01/2015 1923   APPEARANCEUR CLOUDY* 06/01/2015 1923    LABSPEC 1.018 06/01/2015 1923   PHURINE 6.0 06/01/2015 1923   GLUCOSEU 500* 06/01/2015 1923   HGBUR SMALL* 06/01/2015 [place] NEGATIVE 06/01/2015 [place] NEGATIVE 06/01/2015 1923   PROTEINUR 100* 06/01/2015 1923   NITRITE NEGATIVE 06/01/2015 1923   LEUKOCYTESUR NEGATIVE 06/01/2015 1923   Sepsis Labs: @LABRCNTIP (procalcitonin:4,lacticidven:4)  ) Recent Results (from the past 240 hour(s))  Blood Culture (routine x 2)     Status: None   Collection Time: 06/01/15  6:35 PM  Result Value Ref Range Status   Specimen Description BLOOD RIGHT ANTECUBITAL  Final   Special Requests BOTTLES DRAWN AEROBIC ONLY 5CC  Final   Culture NO GROWTH 5 DAYS  Final   Report Status 06/06/2015 FINAL  Final  Blood  Culture (routine x 2)     Status: None   Collection Time: 06/01/15  6:40 PM  Result Value Ref Range Status   Specimen Description BLOOD LEFT ANTECUBITAL  Final   Special Requests BOTTLES DRAWN AEROBIC ONLY 5CC  Final   Culture NO GROWTH 5 DAYS  Final   Report Status 06/06/2015 FINAL  Final  Urine culture     Status: None   Collection Time: 06/01/15  7:10 PM  Result Value Ref Range Status   Specimen Description URINE, CATHETERIZED  Final   Special Requests NONE  Final   Culture NO GROWTH 2 DAYS  Final   Report Status 06/03/2015 FINAL  Final  MRSA PCR Screening     Status: None   Collection Time: 06/01/15  9:49 PM  Result Value Ref Range Status   MRSA by PCR NEGATIVE NEGATIVE Final    Comment:        The GeneXpert MRSA Assay (FDA approved for NASAL specimens only), is one component of a comprehensive MRSA colonization surveillance program. It is not intended to diagnose MRSA infection nor to guide or monitor treatment for MRSA infections.   Culture, bal-quantitative     Status: Abnormal (Preliminary result)   Collection Time: 06/02/15  2:20 PM  Result Value Ref Range Status   Specimen Description BRONCHIAL ALVEOLAR LAVAGE  Final   Special Requests NONE  Final    Gram Stain   Final    ABUNDANT WBC PRESENT, PREDOMINANTLY PMN NO SQUAMOUS EPITHELIAL CELLS SEEN FEW YEAST Performed at [facility]    Culture (A)  Final    PSEUDOMONAS AERUGINOSA STAPHYLOCOCCUS AUREUS Note: RIFAMPIN AND GENTAMICIN SHOULD NOT BE USED AS SINGLE DRUGS FOR TREATMENT OF STAPH INFECTIONS. YEAST CONSISTENT WITH CANDIDA SPECIES Performed at [facility]    Report Status PENDING  Incomplete   Organism ID, Bacteria PSEUDOMONAS AERUGINOSA  Final      Susceptibility   Pseudomonas aeruginosa - MIC*    CEFEPIME <=1 SENSITIVE Sensitive     CEFTAZIDIME 2 SENSITIVE Sensitive     CIPROFLOXACIN <=0.25 SENSITIVE Sensitive     GENTAMICIN <=1 SENSITIVE Sensitive     IMIPENEM 2 SENSITIVE Sensitive     PIP/TAZO <=4 SENSITIVE Sensitive     TOBRAMYCIN <=1 SENSITIVE Sensitive     * PSEUDOMONAS AERUGINOSA         Radiology Studies: Ct Angio Head W/cm &/or Wo Cm  06/05/2015  CLINICAL DATA:  83 year old male with symptoms of new onset left side weakness following admission for respiratory failure requiring intubation. Found have patchy right MCA and PCA territory infarcts on MRI. Initial encounter. EXAM: CT ANGIOGRAPHY HEAD AND NECK TECHNIQUE: Multidetector CT imaging of the head and neck was performed using the standard protocol during bolus administration of intravenous contrast. Multiplanar CT image reconstructions and MIPs were obtained to evaluate the vascular anatomy. Carotid stenosis measurements (when applicable) are obtained utilizing NASCET criteria, using the distal internal carotid diameter as the denominator. CONTRAST:  50 mL Isovue 370 COMPARISON:  Brain MRI 06/04/2015. Head CT without contrast 05/15/2014 and earlier. CT Abdomen and Pelvis 05/31/2010 FINDINGS: CT HEAD Brain: No acute intracranial hemorrhage identified. Cortically based infarct evident at the right superior frontal gyrus on series 7, image 25. No intracranial mass effect. There is also increased  hypodensity in the right caudate on image 17. Other recently depicted small areas of ischemia remain occult on CT. Stable gray-white matter differentiation elsewhere. No ventriculomegaly. Calvarium and skull base: Stable. No acute osseous abnormality  identified. Paranasal sinuses: Right frontal sinus opacification and mucosal thickening is stable. Other visualized sinuses and mastoids are clear. Orbits: Stable, postoperative changes to the globes right greater than left. No acute scalp soft tissue findings. CTA NECK Skeleton: Advanced cervical spine degeneration. There is ankylosis in some of the visualized upper thoracic spine. Incidental spina bifida occulta at T1. No acute osseous abnormality identified. Other neck: Dilated and patulous thoracic esophagus. In 2012 gastric hiatal hernia was demonstrated, but the distal esophagus was not visible. No superior mediastinal lymphadenopathy. Mild anterior upper lobe scarring and paraseptal emphysema greater on the left. Negative thyroid. The glottis is closed. Motion artifact through the hypopharynx and oropharynx. Negative nasopharynx. Negative superior parapharyngeal spaces. Sublingual space, submandibular glands and parotid glands appear normal. No cervical lymphadenopathy. Aortic arch: 3 vessel arch configuration with mild to moderate calcified arch and great vessel atherosclerosis. Right carotid system: No brachiocephalic artery or right CCA origin stenosis. Mildly tortuous right CCA with occasional calcified plaque. Soft and calcified plaque in the right ICA origin and bulb. Complex atherosclerosis in the distal bulb which appears to be partially ulcerated. See series 5, images 92 and 93. There might be stenosis here up to 65 % with respect to the distal vessel (series 13, image 53). Distal to this level the cervical right ICA is normal. Left carotid system: No left CCA origin stenosis. Mildly tortuous left CCA with occasional calcified plaque proximal to the  bifurcation. Bulky calcified plaque at the left ICA origin and bulb, but no stenosis results (series 12, image 119). Patulous bulb compatible with previous left carotid surgery. Negative cervical left ICA distal to the bulb. Vertebral arteries:No proximal right subclavian artery stenosis despite calcified plaque. The right vertebral artery is occluded at its origin with some calcified plaque, series 12, image 125. There is faint reconstituted enhancement in the distal right V2 segment beginning at C3. The vessel has a more normal albeit non dominant appearance at the C1 level and is patent as it crosses the dura. No proximal left subclavian artery stenosis despite soft and calcified plaque. Calcified plaque at the left vertebral artery origin but only mild stenosis results. Dominant left vertebral artery with mild tortuosity in the neck and no other atherosclerosis. CTA HEAD Posterior circulation: Dominant left vertebral artery without stenosis in the posterior fossa. Patent vertebrobasilar junction. Diminutive but patent right V4 segment. Right PICA origin is patent. Left PICA origin is diminutive. No basilar artery stenosis. Normal SCA and PCA origins. Posterior communicating arteries are diminutive or absent. Bilateral PCA branches are within normal limits. Anterior circulation: Both ICA siphons are patent. Minimal calcified plaque on the left with no stenosis. Normal left ophthalmic artery origin. Mild calcified plaque and dolichoectasia on the right with no stenosis. Normal right ophthalmic artery origin. Patent carotid termini. Normal MCA and ACA origins. Tortuous A1 segments. Normal anterior communicating artery. Bilateral ACA branches are mildly irregular but otherwise normal (series 16, image 18). Left MCA M1 segment, bifurcation, and left MCA branches are within normal limits. Right MCA M1 segment, bifurcation, and right MCA bifurcation are patent and within normal limits. There seem to be a paucity of  posterior most right M3 branches, but no proximal M2 occlusion is identified. Venous sinuses: Patent. Anatomic variants: Dominant appearing left vertebral artery. Delayed phase: No abnormal enhancement identified. IMPRESSION: 1. Negative for emergent large vessel occlusion associated with the acute right cerebral infarcts. There is a paucity of posterior right MCA M3 branches, but no M2 branch occlusion identified. 2.  Complex atherosclerotic plaque at the right ICA origin and bulb with some ulceration. Distal bulb level stenosis which might be hemodynamically significant (estimated at up to 65%). Correlation with right side carotid Doppler ultrasound may be valuable. 3. Previous left carotid surgery.  Calcified plaque but no stenosis. 4. 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 5. Dominant appearing left vertebral artery with calcified plaque and mild stenosis at its origin. Otherwise negative posterior circulation. 6. Expected evolution of the larger ischemic foci demonstrated yesterday by MRI. No associated hemorrhage or mass effect. 7. Patulous thoracic esophagus containing fluid/debris. Consider achalasia. 8. Advanced cervical spine degeneration. Areas of ankylosis in the upper thoracic spine. Electronically Signed   By: Genevie Ann M.D.   On: 06/05/2015 14:59   Ct Angio Neck W/cm &/or Wo/cm  06/05/2015  CLINICAL DATA:  82 year old male with symptoms of new onset left side weakness following admission for respiratory failure requiring intubation. Found have patchy right MCA and PCA territory infarcts on MRI. Initial encounter. EXAM: CT ANGIOGRAPHY HEAD AND NECK TECHNIQUE: Multidetector CT imaging of the head and neck was performed using the standard protocol during bolus administration of intravenous contrast. Multiplanar CT image reconstructions and MIPs were obtained to evaluate the vascular anatomy.  Carotid stenosis measurements (when applicable) are obtained utilizing NASCET criteria, using the distal internal carotid diameter as the denominator. CONTRAST:  50 mL Isovue 370 COMPARISON:  Brain MRI 06/04/2015. Head CT without contrast 05/15/2014 and earlier. CT Abdomen and Pelvis 05/31/2010 FINDINGS: CT HEAD Brain: No acute intracranial hemorrhage identified. Cortically based infarct evident at the right superior frontal gyrus on series 7, image 25. No intracranial mass effect. There is also increased hypodensity in the right caudate on image 17. Other recently depicted small areas of ischemia remain occult on CT. Stable gray-white matter differentiation elsewhere. No ventriculomegaly. Calvarium and skull base: Stable. No acute osseous abnormality identified. Paranasal sinuses: Right frontal sinus opacification and mucosal thickening is stable. Other visualized sinuses and mastoids are clear. Orbits: Stable, postoperative changes to the globes right greater than left. No acute scalp soft tissue findings. CTA NECK Skeleton: Advanced cervical spine degeneration. There is ankylosis in some of the visualized upper thoracic spine. Incidental spina bifida occulta at T1. No acute osseous abnormality identified. Other neck: Dilated and patulous thoracic esophagus. In 2012 gastric hiatal hernia was demonstrated, but the distal esophagus was not visible. No superior mediastinal lymphadenopathy. Mild anterior upper lobe scarring and paraseptal emphysema greater on the left. Negative thyroid. The glottis is closed. Motion artifact through the hypopharynx and oropharynx. Negative nasopharynx. Negative superior parapharyngeal spaces. Sublingual space, submandibular glands and parotid glands appear normal. No cervical lymphadenopathy. Aortic arch: 3 vessel arch configuration with mild to moderate calcified arch and great vessel atherosclerosis. Right carotid system: No brachiocephalic artery or right CCA origin stenosis.  Mildly tortuous right CCA with occasional calcified plaque. Soft and calcified plaque in the right ICA origin and bulb. Complex atherosclerosis in the distal bulb which appears to be partially ulcerated. See series 5, images 92 and 93. There might be stenosis here up to 65 % with respect to the distal vessel (series 13, image 53). Distal to this level the cervical right ICA is normal. Left carotid system: No left CCA origin stenosis. Mildly tortuous left CCA with occasional calcified plaque proximal to the bifurcation. Bulky calcified plaque at the left ICA origin and bulb, but no stenosis results (series  12, image 119). Patulous bulb compatible with previous left carotid surgery. Negative cervical left ICA distal to the bulb. Vertebral arteries:No proximal right subclavian artery stenosis despite calcified plaque. The right vertebral artery is occluded at its origin with some calcified plaque, series 12, image 125. There is faint reconstituted enhancement in the distal right V2 segment beginning at C3. The vessel has a more normal albeit non dominant appearance at the C1 level and is patent as it crosses the dura. No proximal left subclavian artery stenosis despite soft and calcified plaque. Calcified plaque at the left vertebral artery origin but only mild stenosis results. Dominant left vertebral artery with mild tortuosity in the neck and no other atherosclerosis. CTA HEAD Posterior circulation: Dominant left vertebral artery without stenosis in the posterior fossa. Patent vertebrobasilar junction. Diminutive but patent right V4 segment. Right PICA origin is patent. Left PICA origin is diminutive. No basilar artery stenosis. Normal SCA and PCA origins. Posterior communicating arteries are diminutive or absent. Bilateral PCA branches are within normal limits. Anterior circulation: Both ICA siphons are patent. Minimal calcified plaque on the left with no stenosis. Normal left ophthalmic artery origin. Mild  calcified plaque and dolichoectasia on the right with no stenosis. Normal right ophthalmic artery origin. Patent carotid termini. Normal MCA and ACA origins. Tortuous A1 segments. Normal anterior communicating artery. Bilateral ACA branches are mildly irregular but otherwise normal (series 16, image 18). Left MCA M1 segment, bifurcation, and left MCA branches are within normal limits. Right MCA M1 segment, bifurcation, and right MCA bifurcation are patent and within normal limits. There seem to be a paucity of posterior most right M3 branches, but no proximal M2 occlusion is identified. Venous sinuses: Patent. Anatomic variants: Dominant appearing left vertebral artery. Delayed phase: No abnormal enhancement identified. IMPRESSION: 1. Negative for emergent large vessel occlusion associated with the acute right cerebral infarcts. There is a paucity of posterior right MCA M3 branches, but no M2 branch occlusion identified. 2. Complex atherosclerotic plaque at the right ICA origin and bulb with some ulceration. Distal bulb level stenosis which might be hemodynamically significant (estimated at up to 65%). Correlation with right side carotid Doppler ultrasound may be valuable. 3. Previous left carotid surgery.  Calcified plaque but no stenosis. 4. 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 5. Dominant appearing left vertebral artery with calcified plaque and mild stenosis at its origin. Otherwise negative posterior circulation. 6. Expected evolution of the larger ischemic foci demonstrated yesterday by MRI. No associated hemorrhage or mass effect. 7. Patulous thoracic esophagus containing fluid/debris. Consider achalasia. 8. Advanced cervical spine degeneration. Areas of ankylosis in the upper thoracic spine. Electronically Signed   By: Genevie Ann M.D.   On: 06/05/2015 14:59        Scheduled Meds: .  amLODipine  5 mg Oral Daily  . aspirin EC  81 mg Oral Daily  . brimonidine  1 drop Left Eye BID  . cholecalciferol  1,000 Units Oral Daily  . donepezil  10 mg Oral QHS  . feeding supplement (GLUCERNA SHAKE)  237 mL Oral BID BM  . heparin  5,000 Units Subcutaneous Q8H  . insulin aspart  0-5 Units Subcutaneous QHS  . insulin aspart  0-9 Units Subcutaneous TID WC  . insulin glargine  10 Units Subcutaneous Daily  . losartan  100 mg Oral Daily  . methylPREDNISolone (SOLU-MEDROL) injection  40 mg Intravenous Q24H  . mirtazapine  7.5 mg Oral QHS  . multivitamin with minerals  1 tablet Oral Daily  . piperacillin-tazobactam (ZOSYN)  IV  3.375 g Intravenous Q8H  . simvastatin  20 mg Oral q1800  . vitamin B-12  1,000 mcg Oral Daily   Continuous Infusions: . sodium chloride 50 mL/hr at 06/03/15 1108     LOS: 6 days    Time spent: [place] M[place]    Bonnell Public, MD [facility] Pager [phone].  If 7PM-7AM, please contact night-coverage [email or web address] Password [email or web address] 06/07/2015, 10:08 AM

## 2015-06-08 ENCOUNTER — Encounter (HOSPITAL_COMMUNITY): Payer: Self-pay | Admitting: *Deleted

## 2015-06-08 ENCOUNTER — Telehealth: Payer: Self-pay | Admitting: Vascular Surgery

## 2015-06-08 LAB — BASIC METABOLIC PANEL
Anion gap: 9 (ref 5–15)
BUN: 34 mg/dL — AB (ref 4–21)
BUN: 34 mg/dL — ABNORMAL HIGH (ref 6–20)
CO2: 26 mmol/L (ref 22–32)
CREATININE: 1.6 mg/dL — AB (ref 0.6–1.3)
Calcium: 9.7 mg/dL (ref 8.9–10.3)
Chloride: 101 mmol/L (ref 101–111)
Creatinine, Ser: 1.61 mg/dL — ABNORMAL HIGH (ref 0.61–1.24)
GFR calc Af Amer: 44 mL/min — ABNORMAL LOW (ref >60)
GFR calc non Af Amer: 38 mL/min — ABNORMAL LOW (ref >60)
GLUCOSE: 188 mg/dL
Glucose, Bld: 188 mg/dL — ABNORMAL HIGH (ref 65–99)
Potassium: 4.2 mmol/L (ref 3.5–5.1)
SODIUM: 136 mmol/L — AB (ref 137–147)
Sodium: 136 mmol/L (ref 135–145)

## 2015-06-08 LAB — GLUCOSE, CAPILLARY
GLUCOSE-CAPILLARY: 177 mg/dL — AB (ref 65–99)
Glucose-Capillary: 164 mg/dL — ABNORMAL HIGH (ref 65–99)
Glucose-Capillary: 132 mg/dL — ABNORMAL HIGH (ref 65–99)
Glucose-Capillary: 158 mg/dL — ABNORMAL HIGH (ref 65–99)

## 2015-06-08 MED ORDER — PREDNISONE 50 MG PO TABS
60.0000 mg | ORAL_TABLET | Freq: Every day | ORAL | Status: DC
  Administered 2015-06-09: 60 mg via ORAL
  Filled 2015-06-08: qty 1

## 2015-06-08 NOTE — Progress Notes (Signed)
PROGRESS NOTE    Chad [name]  [id number] DOB: 07/06/1929 DOA: 06/01/2015 PCP: Hoyt Koch, MD  Outpatient Specialists:   Brief Narrative: 82 year old male past medical history significant for COPD, diabetes, stroke, moderate dementia with behavioral disturbance, and hypertension. Patient was recently admitted to [facility] in February 2017 for sepsis secondary to a left upper lobe pneumonia. He was treated with broad-spectrum antibiotics and IV fluids for hypotension and eventually discharged home. He was recently seen 5/1 the pulmonary clinic for routine follow-up at which time he felt as though his breathing was doing pretty well, however, 5/8 he again presented to the pulmonary clinic with worsening shortness of breath. He was felt to be suffering from COPD exacerbation and was prescribed Augmentin and prednisone taper. As the day progressed he became markedly more dyspneic prompting him to call EMS. Upon their arrival he was found to be unresponsive requiring King airway to be placed for ventilation. He was given Solu-Medrol, albuterol, and 2 g of magnesium by EMS personnel and was promptly intubated on arrival to the emergency department. ABG concerning for profound respiratory acidosis (pH 6.9 with CO2 > 100). Staff also reportedly suctioning copious amounts of food like secretions from ET tube raising concern for aspiration. Patient was transferred to the Hospitalist team after ICU course. Reported to be leaning to the left side. MRI brain revealed multiple areas of infarcts (thought to be water shed areas as per Neurology). Neurology work up is still in progress. CTA brain and Neck result noted, and Vascular surgery consulted.  Assessment & Plan:   Principal Problem:   Acute hypercapnic respiratory failure (HCC) Active Problems:   COPD exacerbation (HCC)   Acute encephalopathy   HCAP (healthcare-associated pneumonia)   COPD with exacerbation (HCC)   Cerebral thrombosis  with cerebral infarction   Acute CVA (cerebrovascular accident) ([place])  - Acute CVA: Pursue Rehab. - Acute on chronic hypercarbic respiratory failure secondary to COPD exacerbation: Resolved significantly. Change IV steroids to oral.  - Probable HCAP/ LLL atelectasis Complete course of antibiotics. Likely DC antibiotics in am. - Concern for aspiration pneumonitis. -HTN, HLD - Historical. Optimize. - Elevated trop -demand ischemia versus prognostic significance indicating severity of illness.  -  AKI versus AKI on CKD - Stable. -  Acute metabolic encephalopathy - Improved. Baseline dementia noted.   FAMILY -wife   DVT prophylaxis:  Heparin  Family Communication: Wife updated today. Disposition Plan: Home in 1-2 days. DC foley's catheter.  Consultants:   ICU transfer.  Procedures: Intubation and self extubation. Antimicrobials: IV Zosyn.  Subjective: No new complaints. No fever or chills. No SOB or chest pain.  Objective: Filed Vitals:   06/07/15 1007 06/07/15 1243 06/07/15 2123 06/08/15 0611  BP: 140/91 137/73 150/66 163/68  Pulse: 64 88 82 58  Temp:  98.1 F (36.7 C) 98 F (36.7 C) 98.3 F (36.8 C)  TempSrc:  Oral Oral Oral  Resp:  18 18 18   Height:      Weight:    74.345 kg (163 lb 14.4 oz)  SpO2: 100% 100% 100% 97%    Intake/Output Summary (Last 24 hours) at 06/08/15 0947 Last data filed at 06/08/15 0900  Gross per 24 hour  Intake    920 ml  Output   2351 ml  Net  -1431 ml   Filed Weights   06/06/15 0530 06/07/15 0500 06/08/15 0611  Weight: 74.798 kg (164 lb 14.4 oz) 73.528 kg (162 lb 1.6 oz) 74.345 kg (163 lb 14.4 oz)  Examination:  General exam: Not in distress.  Respiratory system: Good air entry. Cardiovascular system: S1 & S2. Gastrointestinal system: Abdomen is nondistended, soft and nontender.   Central nervous system: Awake and Alert. Very minimal LUE weakness elicited. Extremities: No edema.  Data Reviewed: I have personally reviewed  following labs and imaging studies  CBC:  Recent Labs Lab 06/01/15 1844 06/01/15 1851 06/02/15 0200 06/03/15 0222 06/05/15 0441  WBC 18.9*  --  13.3* 16.8* 8.8  NEUTROABS 6.0  --   --   --  5.8  HGB 12.9* 15.0 10.9* 10.6* 10.8*  HCT 42.3 44.0 35.6* 33.8* 34.2*  MCV 89.2  --  84.0 82.6 83.8  PLT 298  --  190 207 [id number]   Basic Metabolic Panel:  Recent Labs Lab 06/01/15 1844 06/01/15 1851 06/02/15 0200 06/03/15 0222 06/05/15 0441 06/08/15 0303  NA 135 137 136 142 142 136  K 5.0 5.1 4.3 3.8 3.7 4.2  CL 101 106 102 109 105 101  CO2 17*  --  16* 20* 25 26  GLUCOSE 306* 292* 346* 214* 133* 188*  BUN 22* 29* 25* 24* 22* 34*  CREATININE 1.70* 1.40* 1.49* 1.38* 1.48* 1.61*  CALCIUM 10.1  --  8.5* 8.6* 9.1 9.7  MG 3.7*  --  2.0  --   --   --   PHOS 9.0*  --  1.6*  --  4.0  --    GFR: Estimated Creatinine Clearance: 34.8 mL/min (by C-G formula based on Cr of 1.61). Liver Function Tests:  Recent Labs Lab 06/01/15 1844 06/05/15 0441  AST 33  --   ALT 22  --   ALKPHOS 88  --   BILITOT 0.4  --   PROT 7.3  --   ALBUMIN 4.1 3.0*    Recent Labs Lab 06/01/15 2344  LIPASE 26   No results for input(s): AMMONIA in the last 168 hours. Coagulation Profile:  Recent Labs Lab 06/01/15 1844  INR 1.21   Cardiac Enzymes:  Recent Labs Lab 06/01/15 1844 06/01/15 2119 06/02/15 0200 06/02/15 0733 06/02/15 1040  TROPONINI <0.03 0.16* 0.33* 0.67* 0.63*   BNP (last 3 results) No results for input(s): PROBNP in the last 8760 hours. HbA1C: No results for input(s): HGBA1C in the last 72 hours. CBG:  Recent Labs Lab 06/07/15 0606 06/07/15 1149 06/07/15 1608 06/07/15 2117 06/08/15 0629  GLUCAP 168* 215* 289* 229* 164*   Lipid Profile: No results for input(s): CHOL, HDL, LDLCALC, TRIG, CHOLHDL, LDLDIRECT in the last 72 hours. Thyroid Function Tests: No results for input(s): TSH, T4TOTAL, FREET4, T3FREE, THYROIDAB in the last 72 hours. Anemia Panel: No results for  input(s): VITAMINB12, FOLATE, FERRITIN, TIBC, IRON, RETICCTPCT in the last 72 hours. Urine analysis:    Component Value Date/Time   COLORURINE YELLOW 06/01/2015 1923   APPEARANCEUR CLOUDY* 06/01/2015 1923   LABSPEC 1.018 06/01/2015 1923   PHURINE 6.0 06/01/2015 1923   GLUCOSEU 500* 06/01/2015 1923   HGBUR SMALL* 06/01/2015 [place] NEGATIVE 06/01/2015 [place] NEGATIVE 06/01/2015 1923   PROTEINUR 100* 06/01/2015 1923   NITRITE NEGATIVE 06/01/2015 1923   LEUKOCYTESUR NEGATIVE 06/01/2015 1923   Sepsis Labs: @LABRCNTIP (procalcitonin:4,lacticidven:4)  ) Recent Results (from the past 240 hour(s))  Blood Culture (routine x 2)     Status: None   Collection Time: 06/01/15  6:35 PM  Result Value Ref Range Status   Specimen Description BLOOD RIGHT ANTECUBITAL  Final   Special Requests BOTTLES DRAWN AEROBIC ONLY 5CC  Final   Culture NO GROWTH 5 DAYS  Final   Report Status 06/06/2015 FINAL  Final  Blood Culture (routine x 2)     Status: None   Collection Time: 06/01/15  6:40 PM  Result Value Ref Range Status   Specimen Description BLOOD LEFT ANTECUBITAL  Final   Special Requests BOTTLES DRAWN AEROBIC ONLY 5CC  Final   Culture NO GROWTH 5 DAYS  Final   Report Status 06/06/2015 FINAL  Final  Urine culture     Status: None   Collection Time: 06/01/15  7:10 PM  Result Value Ref Range Status   Specimen Description URINE, CATHETERIZED  Final   Special Requests NONE  Final   Culture NO GROWTH 2 DAYS  Final   Report Status 06/03/2015 FINAL  Final  MRSA PCR Screening     Status: None   Collection Time: 06/01/15  9:49 PM  Result Value Ref Range Status   MRSA by PCR NEGATIVE NEGATIVE Final    Comment:        The GeneXpert MRSA Assay (FDA approved for NASAL specimens only), is one component of a comprehensive MRSA colonization surveillance program. It is not intended to diagnose MRSA infection nor to guide or monitor treatment for MRSA infections.   Culture,  bal-quantitative     Status: Abnormal   Collection Time: 06/02/15  2:20 PM  Result Value Ref Range Status   Specimen Description BRONCHIAL ALVEOLAR LAVAGE  Final   Special Requests NONE  Final   Gram Stain   Final    ABUNDANT WBC PRESENT, PREDOMINANTLY PMN NO SQUAMOUS EPITHELIAL CELLS SEEN FEW YEAST Performed at [facility]    Culture (A)  Final    PSEUDOMONAS AERUGINOSA STAPHYLOCOCCUS AUREUS Note: RIFAMPIN AND GENTAMICIN SHOULD NOT BE USED AS SINGLE DRUGS FOR TREATMENT OF STAPH INFECTIONS. This organism DOES NOT demonstrate inducible Clindamycin resistance in vitro. YEAST CONSISTENT WITH CANDIDA SPECIES Performed at [facility]    Report Status 06/07/2015 FINAL  Final   Organism ID, Bacteria PSEUDOMONAS AERUGINOSA  Final   Organism ID, Bacteria STAPHYLOCOCCUS AUREUS  Final      Susceptibility   Staphylococcus aureus - MIC*    CLINDAMYCIN <=0.25 SENSITIVE Sensitive     ERYTHROMYCIN >=8 RESISTANT Resistant     GENTAMICIN <=0.5 SENSITIVE Sensitive     LEVOFLOXACIN 0.25 SENSITIVE Sensitive     OXACILLIN 0.5 SENSITIVE Sensitive     RIFAMPIN <=0.5 SENSITIVE Sensitive     TRIMETH/SULFA <=10 SENSITIVE Sensitive     VANCOMYCIN 1 SENSITIVE Sensitive     TETRACYCLINE <=1 SENSITIVE Sensitive     MOXIFLOXACIN <=0.25 SENSITIVE Sensitive     * STAPHYLOCOCCUS AUREUS   Pseudomonas aeruginosa - MIC*    CEFEPIME <=1 SENSITIVE Sensitive     CEFTAZIDIME 2 SENSITIVE Sensitive     CIPROFLOXACIN <=0.25 SENSITIVE Sensitive     GENTAMICIN <=1 SENSITIVE Sensitive     IMIPENEM 2 SENSITIVE Sensitive     PIP/TAZO <=4 SENSITIVE Sensitive     TOBRAMYCIN <=1 SENSITIVE Sensitive     * PSEUDOMONAS AERUGINOSA         Radiology Studies: No results found.      Scheduled Meds: . amLODipine  5 mg Oral Daily  . aspirin EC  81 mg Oral Daily  . brimonidine  1 drop Left Eye BID  . cholecalciferol  1,000 Units Oral Daily  . donepezil  10 mg Oral QHS  . feeding supplement (GLUCERNA  SHAKE)  237 mL Oral BID  BM  . heparin  5,000 Units Subcutaneous Q8H  . insulin aspart  0-5 Units Subcutaneous QHS  . insulin aspart  0-9 Units Subcutaneous TID WC  . insulin glargine  10 Units Subcutaneous Daily  . losartan  100 mg Oral Daily  . mirtazapine  7.5 mg Oral QHS  . multivitamin with minerals  1 tablet Oral Daily  . piperacillin-tazobactam (ZOSYN)  IV  3.375 g Intravenous Q8H  . [START ON 06/09/2015] predniSONE  60 mg Oral Q breakfast  . simvastatin  20 mg Oral q1800  . vitamin B-12  1,000 mcg Oral Daily   Continuous Infusions: . sodium chloride 50 mL/hr at 06/07/15 1712     LOS: 7 days    Time spent: Tama Mins    Bonnell Public, MD [facility] Pager [phone].  If 7PM-7AM, please contact night-coverage [email or web address] Password [facility] 06/08/2015, 9:47 AM

## 2015-06-08 NOTE — Care Management Important Message (Signed)
Important Message  Patient Details  Name: Chad [name] MRN: [id number] Date of Birth: Mar 20, 1929   Medicare Important Message Given:  Yes    Erenest Rasher, RN 06/08/2015, 4:12 PM

## 2015-06-08 NOTE — Progress Notes (Signed)
Occupational Therapy Evaluation Patient Details Name: Chad [name] MRN: [id number] DOB: 1929/07/11 Today's Date: 06/08/2015    History of Present Illness  5/8 he again presented to the pulmonary clinic with worsening shortness of breath. He was felt to be suffering from COPD exacerbation. As the day progressed he became markedly more dyspneic prompting him to call EMS. Upon their arrival he was found to be unresponsive requirin airway to be placed for ventilation. He was promptly intubated on arrival to the emergency department. He self-extubated 5/10 early a.m. Noted Lt lean, LUE changes and 5/11 MRI brain showed Rt parietal, temporal, occipital and Lt parietal CVAs  PMHx- COPD, diabetes, stroke, moderate dementia with behavioral disturbance, and hypertension.    Clinical Impression   PTA, pt was mod I with mobility and wife assisted minimally with ADL due to apparent baseline cognitive deficits.Pt presents with apparent cognitive decline in additional to visual perceptual deficits and generalized weakness of LU/LE, causing a significant functional decline with ADL and mobility. Feel pt is an excellent CIR candidate to maximize his functional level of independence and decrease burden of care on caregiver. Will follow acutely to address established goals.     Follow Up Recommendations  CIR;Supervision/Assistance - 24 hour    Equipment Recommendations  3 in 1 bedside comode;Tub/shower bench    Recommendations for Other Services Rehab consult     Precautions / Restrictions Precautions Precautions: Fall Precaution Comments: inattention Lt side Restrictions Weight Bearing Restrictions: No      Mobility Bed Mobility Overal bed mobility: Needs Assistance Bed Mobility: Supine to Sit     Supine to sit: Mod assist     General bed mobility comments: unable to translatetrunk upright todya without mod A  Transfers Overall transfer level: Needs assistance   Transfers: Sit to/from  Stand;Stand Pivot Transfers Sit to Stand: Min assist Stand pivot transfers: Mod assist       General transfer comment: Mod A today going to toward L side. tactile cues to shift wieight and mobilize LLE    Balance     Sitting balance-Leahy Scale: Poor Sitting balance - Comments: L bias     Standing balance-Leahy Scale: Zero                              ADL Overall ADL's : Needs assistance/impaired     Grooming: Minimal assistance;Sitting   Upper Body Bathing: Minimal assitance;Sitting   Lower Body Bathing: Moderate assistance;Sitting/lateral leans   Upper Body Dressing : Moderate assistance;Sitting   Lower Body Dressing: Moderate assistance Lower Body Dressing Details (indicate cue type and reason): Able to donn socks in sitting Toilet Transfer: Moderate assistance Toilet Transfer Details (indicate cue type and reason): simulated         Functional mobility during ADLs: Moderate assistance (transfer toward L) General ADL Comments: Wife states pt needs assistance at baseline due to confusion but staes he is "much worse now"     Vision Additional Comments: Wears w"whenever he wants". 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 Does not automatically scan L field when wearching for object. Will scan with verbal cues.   Perception Perception Perception Tested?: Yes Perception Deficits: Inattention/neglect Inattention/Neglect: Does not attend to right visual field Spatial deficits: overshooting when reaching. Poor awarness of midline Comments: 0/5 targets on double simulataneous stimulation   Praxis Praxis Praxis tested?: Deficits Deficits: Initiation;Ideomotor (?  baseline)    Pertinent Vitals/Pain       Hand Dominance Right   Extremity/Trunk Assessment Upper Extremity Assessment Upper Extremity Assessment: LUE deficits/detail;Generalized weakness LUE Deficits /  Details: decreased coordination LUE Sensation: decreased proprioception LUE Coordination: decreased fine motor ("clumsy hand")   Lower Extremity Assessment Lower Extremity Assessment: Defer to PT evaluation   Cervical / Trunk Assessment Cervical / Trunk Assessment: Other exceptions Cervical / Trunk Exceptions: L lateral lean   Communication Communication Communication: HOH   Cognition Arousal/Alertness: Awake/alert Behavior During Therapy: Flat affect Overall Cognitive Status: Impaired/Different from baseline Area of Impairment: Orientation;Attention;Memory;Following commands;Safety/judgement;Awareness;Problem solving Orientation Level: Disoriented to;Time;Situation Current Attention Level: Sustained Memory: Decreased recall of precautions;Decreased short-term memory Following Commands: Follows one step commands with increased time Safety/Judgement: Decreased awareness of safety;Decreased awareness of deficits Awareness: Intellectual Problem Solving: Slow processing;Decreased initiation;Difficulty sequencing General Comments: Per wife, has moderate dementia at baseline.   General Comments       Exercises Exercises: Other exercises Other Exercises Other Exercises: Educated wife on importance of increasing attention to L   Shoulder Instructions      Home Living Family/patient expects to be discharged to:: Inpatient rehab Living Arrangements: Spouse/significant other Available Help at Discharge: Family;Available 24 hours/day Type of Home: House Home Access: Stairs to enter [employer] of Steps: 1   Home Layout: Multi-level Alternate Level Stairs-Number of Steps: 1   Bathroom Shower/Tub: Corporate investment banker: Standard Bathroom Accessibility: Yes How Accessible: Accessible via walker Home Equipment: [place] - 2 wheels;Cane - single point;Toilet riser          Prior Functioning/Environment Level of Independence: Independent with  assistive device(s);Needs assistance  Gait / Transfers Assistance Needed: helped in and out of shower ADL's / Homemaking Assistance Needed: Wife helped with dressing due to confusion/difficulty problem solving   Comments: Has fallen x2.    OT Diagnosis: Generalized weakness;Acute pain;Disturbance of vision;Cognitive deficits   OT Problem List: Decreased strength;Decreased activity tolerance;Impaired balance (sitting and/or standing);Impaired vision/perception;Decreased coordination;Decreased cognition;Decreased safety awareness;Decreased knowledge of use of DME or AE;Impaired sensation;Impaired UE functional use   OT Treatment/Interventions: Self-care/ADL training;Therapeutic exercise;Neuromuscular education;DME and/or AE instruction;Therapeutic activities;Cognitive remediation/compensation;Visual/perceptual remediation/compensation;Patient/family education;Balance training    OT Goals(Current goals can be found in the care plan section) Acute Rehab OT Goals Patient Stated Goal: per wife to get back to his PLOF OT Goal Formulation: With patient Time For Goal Achievement: 06/22/15 Potential to Achieve Goals: Good ADL Goals Pt Will Perform Grooming: with set-up;sitting Pt Will Perform Upper Body Bathing: with set-up;with supervision;sitting Pt Will Perform Lower Body Bathing: with set-up;with supervision;sit to/from stand Pt Will Transfer to Toilet: with min guard assist;bedside commode  OT Frequency: Min 2X/week   Barriers to D/C:            Co-evaluation              End of Session Equipment Utilized During Treatment: Gait belt Nurse Communication: Mobility status  Activity Tolerance: Patient tolerated treatment well Patient left: in chair;with call bell/phone within reach;with chair alarm set;with family/visitor present   Time: 1120-1145 OT Time Calculation (min): 25 min Charges:  OT General Charges $OT Visit: 1 Procedure OT Evaluation $OT Eval Moderate Complexity: 1  Procedure OT Treatments $Self Care/Home Management : 8-22 mins G-Codes:    Ivah Girardot,HILLARY 2015-07-02, 11:57 AM   Maurie Boettcher, OTR/L  [phone] 2015/07/02

## 2015-06-08 NOTE — Clinical Social Work Note (Addendum)
[facility] willing to accept patient but do not take LOG's. [facility] and [facility] out of network with [employer]. Spoke with Nathaniel Man, Engineer, site of Social Work. He advised CSW to call [facility] and if they cannot accept him, have RNCM consult for home health. CSW called [facility] and left a voicemail with call back information.  Dayton Scrape, [place]  [facility] accepted. CSW called admissions again to find out if they will accept an LOG and spoke with [facility]. She stated that she passed the information along to [facility], also in admissions, and is waiting on a response.  Dayton Scrape, [place]  [facility] will accept an LOG. CSW will speak with patient's wife when she gets back to the hospital from a funeral.  Dayton Scrape, [place]  Patient's wife does not want him to go to [facility].  Dayton Scrape, [place] [phone]  Patient's wife spoke with Danne Baxter, admissions coordinator for [facility] to discuss payment options prior to getting insurance approval. This amount it too large for patient and his wife to pay. Ms. Julious Payer consult with CSW. CSW met with patient and his wife again. [facility] is still too far for them. Patient's wife says the only option at this point is home health. CSW notified RNCM.  Dayton Scrape, [place] [phone]  Discharge today cancelled. CSW called [employer] about starting authorization and left voicemail with contact information.  Dayton Scrape, [place]

## 2015-06-08 NOTE — Telephone Encounter (Signed)
Spoke to pt' wife to sch appt 5/31 at 3:45.

## 2015-06-08 NOTE — Clinical Social Work Note (Signed)
CSW spoke with Danne Baxter, admissions coordinator for [facility]. They are going to start insurance auth today but patient medically stable to be discharged today. Patient's wife states that they cannot pay out of pocket until insurance Josem Kaufmann has been obtained for SNF. CSW called [facility] prior to seeing patient and admissions coordinator was going to call back soon with a decision. Nurses had concerns about behaviors. CSW explained that there were no known behavioral issues at [facility].   Dayton Scrape, [place]

## 2015-06-08 NOTE — Care Management Note (Signed)
Case Management Note  Patient Details  Name: Chad [name] MRN: [id number] Date of Birth: Feb 05, 1929  Subjective/Objective:       PNA, Stroke             Action/Plan: Discharge Planning:  NCM spoke to pt and wife, Chad [name] at bedside. States she prefers pt dc to SNF-rehab. PT recommended CIR but pt will have copay. Wife contacted [employer] and SNF rehab covered at 100% for first 20 days. She prefers SNF at [facility]. NCM notified CSW of decision. Will send paperwork to receive auth. Wife states pt has RW at home and elevated commode seat. They can borrow a bedside commode if needed. Offered choice for HH/list provided and requested [place]. Wife agreed with [facility].   Franchot Gallo MD   Expected Discharge Date:  06/10/2015              Expected Discharge Plan:  Skilled Nursing Facility  In-House Referral:  Clinical Social Work  Discharge planning Services  CM Consult  Post Acute Care Choice:  NA Choice offered to:  NA  DME Arranged:  N/A DME Agency:  NA  HH Arranged:  NA HH Agency:  NA  Status of Service:  Completed, signed off  Medicare Important Message Given:  Yes Date Medicare IM Given:    Medicare IM give by:    Date Additional Medicare IM Given:    Additional Medicare Important Message give by:     If discussed at [place] of Stay Meetings, dates discussed:    Additional Comments:  Erenest Rasher, RN 06/08/2015, 6:06 PM

## 2015-06-08 NOTE — Progress Notes (Addendum)
I met with pt and his wife at bedside to discuss a possible inpt rehab admission pending [employer] approval. They prefer an inpt rehab admit rather than SNF if insurance will approve. I will initiate authorization. I updated Sarah, SW and RN CM, Estill Batten. I have ordered OT eval which will be needed for insurance. [phone]

## 2015-06-08 NOTE — Progress Notes (Signed)
Speech Language Pathology Treatment: Dysphagia  Patient Details Name: Chad [name] MRN: [id number] DOB: [birth year] Today's Date: 06/08/2015 Time: 4270-6237 SLP Time Calculation (min) (ACUTE ONLY): 13 min  Assessment / Plan / Recommendation Clinical Impression  Pt is tolerating regular diet, thin liquids with diminishing s/s of dysphagia; appetite improved.  Consumption of regular solids and thin liquids today did not elicit s/s of aspiration.  Pt reports no difficulty; lungs are clear.  Pt will benefit from SLP cognitive assessment upon admission to CIR (baseline dementia with acute CVA dx).    HPI HPI: 82 year old male past medical history significant for COPD, diabetes, stroke, moderate dementia with behavioral disturbance, and hypertension. Patient was recently admitted to [facility] in February 2017 for sepsis secondary to a left upper lobe pneumonia. He was treated with broad-spectrum antibiotics and IV fluids for hypotension and eventually discharged home. He was recently seen 5/1 the pulmonary clinic for routine follow-up at which time he felt as though his breathing was doing pretty well, however, 5/8 he again presented to the pulmonary clinic with worsening shortness of breath. He was felt to be suffering from COPD exacerbation and was prescribed Augmentin and prednisone taper. As the day progressed he became markedly more dyspneic prompting him to call EMS. Upon their arrival he was found to be unresponsive requiring King airway to be placed for ventilation. He was promptly intubated on arrival to the emergency department.  Staff also reportedly suctioning copious amounts of food like secretions from ET tube raising concern for aspiration. Patient was transferred to the Hospitalist team after ICU course. Reported to be leaning to the left side. MRI brain revealed multiple areas of infarcts (thought to be water shed areas as per Neurology)      SLP Plan  All goals met for swallowing; pt  will benefit from cognitive-linguistic assessment upon admission to CIR.    Recommendations  Diet recommendations: Regular;Thin liquid Liquids provided via: Cup Medication Administration: Whole meds with liquid Supervision: Patient able to self feed Postural Changes and/or Swallow Maneuvers: Seated upright 90 degrees             Oral Care Recommendations: Oral care BID Plan: All goals met     GO                [name] 06/08/2015, 3:07 PM

## 2015-06-08 NOTE — Telephone Encounter (Signed)
-----   Message from Mena Goes, RN sent at 06/08/2015  9:31 AM EDT ----- Regarding: schedule   ----- Message -----    From: Elam Dutch, MD    Sent: 06/07/2015  10:44 AM      To: Vvs Charge Pool  Level 4 consult for symptomatic carotid Requesting: Sarina Ill neuro PA  Pt of Dr Early prior bilat CEA 98. Please get the old op notes for Dr Donnetta Hutching.  Pt needs appt with Early in 2-4 weeks  Ruta Hinds

## 2015-06-08 NOTE — Clinical Social Work Note (Signed)
CSW called and left voicemail with Gunnar Fusi, admissions coordinator for CIR to determine discharge plan for patient. Once answer is received, will make Dr. Marthenia Rolling aware.  Dayton Scrape, [place]

## 2015-06-08 NOTE — Progress Notes (Signed)
Physical Therapy Treatment Patient Details Name: Chad [name] MRN: [id number] DOB: 03-18-1929 Today's Date: 06/08/2015    History of Present Illness  5/8 he again presented to the pulmonary clinic with worsening shortness of breath. He was felt to be suffering from COPD exacerbation. As the day progressed he became markedly more dyspneic prompting him to call EMS. Upon their arrival he was found to be unresponsive requirin airway to be placed for ventilation. He was promptly intubated on arrival to the emergency department. He self-extubated 5/10 early a.m. Noted Lt lean, LUE changes and 5/11 MRI brain showed Rt parietal, temporal, occipital and Lt parietal CVAs  PMHx- COPD, diabetes, stroke, moderate dementia with behavioral disturbance, and hypertension.     PT Comments    Pt admitted with above diagnosis. Pt currently with functional limitations due to balance and endurance deficits as well as inattention to left side. Pt was able to ambulate with RW but needed +2 mod assist.  Pt with large improvement in mobility since last seen by PT.  Wife encouraged with pts progress.   Pt will benefit from skilled PT to increase their independence and safety with mobility to allow discharge to the venue listed below.    Follow Up Recommendations  CIR     Equipment Recommendations  Other (comment) (TBA as progresses)    Recommendations for Other Services Rehab consult;OT consult     Precautions / Restrictions Precautions Precautions: Fall Precaution Comments: ?inattention Lt side Restrictions Weight Bearing Restrictions: No    Mobility  Bed Mobility Overal bed mobility: Needs Assistance Bed Mobility: Supine to Sit     Supine to sit: Mod assist     General bed mobility comments: pt in chair on arrival.  Transfers Overall transfer level: Needs assistance Equipment used: None Transfers: Sit to/from Stand Sit to Stand: Min assist;+2 physical assistance Stand pivot transfers: Mod  assist       General transfer comment: Pt leans left.  Needs assist to get weight onto right LE. Constant cues for attention to left side with therapist cuing to push up on chair with tactile assist as well on left UE.  Then therapist assisted pt to move left UE onto RW as well as held left UE on RW while walking as well.    Ambulation/Gait Ambulation/Gait assistance: Mod assist;+2 physical assistance Ambulation Distance (Feet): 65 Feet (50 feet and then 15 feet) Assistive device: Rolling walker (2 wheeled) Gait Pattern/deviations: Step-to pattern;Decreased step length - left;Decreased stance time - right;Decreased weight shift to right;Drifts right/left   Gait velocity interpretation: Below normal speed for age/gender General Gait Details: Initially, pt was able to step bil LEs with cues but as he fatigued, he struggled with even advancing feet as he was dragging them somewhat.  Pt also with incr flexed posture as he fatigued.  Pt needed assist to weight shift to right as he was leaning significantly to left needing weight shift each step but incr assist needed with this as pt continued to ambulate.  Pt a heavy mod assist for standing and ambulation as he fatigued.   Stairs            Wheelchair Mobility    Modified Rankin (Stroke Patients Only) Modified Rankin (Stroke Patients Only) Pre-Morbid Rankin Score: Moderately severe disability (supervision due to dementia) Modified Rankin: Severe disability     Balance Overall balance assessment: Needs assistance;History of Falls   Sitting balance-Leahy Scale: Poor Sitting balance - Comments: L bias Postural control: Left lateral lean  Standing balance support: Bilateral upper extremity supported;During functional activity Standing balance-Leahy Scale: Poor Standing balance comment: Needed min to mod assist for static balance with RW support.  left Lateral lean as pt with left inattention.                      Cognition  Arousal/Alertness: Awake/alert Behavior During Therapy: Flat affect Overall Cognitive Status: Impaired/Different from baseline Area of Impairment: Orientation;Attention;Memory;Following commands;Safety/judgement;Awareness;Problem solving Orientation Level: Disoriented to;Time;Situation Current Attention Level: Sustained Memory: Decreased recall of precautions;Decreased short-term memory Following Commands: Follows one step commands with increased time Safety/Judgement: Decreased awareness of safety;Decreased awareness of deficits Awareness: Intellectual Problem Solving: Slow processing;Decreased initiation;Difficulty sequencing General Comments: Per wife, has moderate dementia at baseline.    Exercises General Exercises - Lower Extremity Ankle Circles/Pumps: AROM;Both;10 reps;Supine Quad Sets: AROM;Both;10 reps;Supine Long Arc Quad: AROM;Both;10 reps;Supine Heel Slides: AROM;Both;10 reps;Supine Other Exercises Other Exercises: Educated wife on importance of increasing attention to L    General Comments General comments (skin integrity, edema, etc.): wife present.  wife concerned about pts d/c plan.       Pertinent Vitals/Pain Pain Assessment: No/denies pain  VSS    Home Living Family/patient expects to be discharged to:: Inpatient rehab Living Arrangements: Spouse/significant other Available Help at Discharge: Family;Available 24 hours/day Type of Home: House Home Access: Stairs to enter   Home Layout: Multi-level Home Equipment: Environmental consultant - 2 wheels;Cane - single point;Toilet riser      Prior Function Level of Independence: Independent with assistive device(s);Needs assistance  Gait / Transfers Assistance Needed: helped in and out of shower ADL's / Homemaking Assistance Needed: Wife helped with dressing due to confusion/difficulty problem solving Comments: Has fallen x2.   PT Goals (current goals can now be found in the care plan section) Acute Rehab PT Goals Patient  Stated Goal: per wife to get back to his PLOF Progress towards PT goals: Progressing toward goals    Frequency  Min 4X/week    PT Plan Current plan remains appropriate    Co-evaluation             End of Session Equipment Utilized During Treatment: Gait belt Activity Tolerance: Patient limited by fatigue Patient left: in chair;with call bell/phone within reach;with chair alarm set;with family/visitor present     Time: 1202-1220 PT Time Calculation (min) (ACUTE ONLY): 18 min  Charges:  $Gait Training: 8-22 mins                    G Codes:      Kristell Wooding, Arrie Aran F 2015/06/13, 1:25 PM [employer] Acute Rehabilitation [phone] (pager)

## 2015-06-08 NOTE — Progress Notes (Signed)
I continue to await insurance decision for a possible inpt rehab admission. I spoke with pt's wife this afternoon to discuss her copays that would apply for CIR if approved. I will follow up with SW. [phone]

## 2015-06-09 LAB — GLUCOSE, CAPILLARY
GLUCOSE-CAPILLARY: 281 mg/dL — AB (ref 65–99)
Glucose-Capillary: 126 mg/dL — ABNORMAL HIGH (ref 65–99)

## 2015-06-09 MED ORDER — INSULIN GLARGINE 100 UNIT/ML ~~LOC~~ SOLN: 10.0000 [IU] | Freq: Every day | SUBCUTANEOUS | Status: DC

## 2015-06-09 MED ORDER — ADULT MULTIVITAMIN W/MINERALS CH: 1.0000 | ORAL_TABLET | Freq: Every day | ORAL | Status: DC

## 2015-06-09 MED ORDER — PREDNISONE 10 MG PO TABS: 10.0000 mg | ORAL_TABLET | Freq: Every day | ORAL | Status: DC

## 2015-06-09 MED ORDER — SIMVASTATIN 20 MG PO TABS: 20.0000 mg | ORAL_TABLET | Freq: Every day | ORAL | Status: DC

## 2015-06-09 MED ORDER — PREDNISONE 50 MG PO TABS: 60.0000 mg | ORAL_TABLET | Freq: Every day | ORAL | Status: DC

## 2015-06-09 MED ORDER — MIRTAZAPINE 7.5 MG PO TABS: 7.5000 mg | ORAL_TABLET | Freq: Every day | ORAL | Status: DC

## 2015-06-09 MED ORDER — PREDNISONE 10 MG PO TABS: ORAL_TABLET | ORAL | Status: DC

## 2015-06-09 MED ORDER — BRIMONIDINE TARTRATE 0.15 % OP SOLN: 1.0000 [drp] | Freq: Two times a day (BID) | OPHTHALMIC | Status: DC

## 2015-06-09 MED ORDER — PREDNISONE 20 MG PO TABS: 20.0000 mg | ORAL_TABLET | Freq: Every day | ORAL | Status: DC

## 2015-06-09 MED ORDER — PREDNISONE 50 MG PO TABS
50.0000 mg | ORAL_TABLET | Freq: Every day | ORAL | Status: DC
Start: 2015-06-13 — End: 2015-06-09

## 2015-06-09 MED ORDER — PREDNISONE 20 MG PO TABS: 30.0000 mg | ORAL_TABLET | Freq: Every day | ORAL | Status: DC

## 2015-06-09 MED ORDER — PREDNISONE 20 MG PO TABS: 40.0000 mg | ORAL_TABLET | Freq: Every day | ORAL | Status: DC

## 2015-06-09 MED ORDER — GLUCERNA SHAKE PO LIQD: 237.0000 mL | Freq: Two times a day (BID) | ORAL | Status: DC

## 2015-06-09 NOTE — Discharge Summary (Signed)
Physician Discharge Summary  Patient ID: Chad [name] MRN: [id number] DOB/AGE: October 10, 1929 82 y.o.  Admit date: 06/01/2015 Discharge date: 06/09/2015  Admission Diagnoses:  Discharge Diagnoses:  Principal Problem:   Acute hypercapnic respiratory failure ([place]) Active Problems:   COPD exacerbation ([place])   Acute encephalopathy   HCAP (healthcare-associated pneumonia)   COPD with exacerbation ([place])   Cerebral thrombosis with cerebral infarction   Acute CVA (cerebrovascular accident) [facility])   Discharged Condition: Assess at the time of discharge. Stable for discharge today.  Hospital Course: 82 year old male with past medical history significant for COPD, diabetes, stroke, moderate dementia with behavioral disturbance, and hypertension. Patient was recently admitted to [facility] in February 2017 for sepsis secondary to a left upper lobe pneumonia. He was treated with broad-spectrum antibiotics and IV fluids for hypotension and eventually discharged home. He was recently seen 05/25/2015 at the pulmonary clinic for routine follow-up at which time he felt as though his breathing was doing pretty well, however, on 06/01/2015 he presented again to the pulmonary clinic with worsening shortness of breath. He was felt to be suffering from COPD exacerbation and was prescribed Augmentin and prednisone taper. As the day progressed he became markedly more dyspneic prompting him to call EMS. Upon their arrival he was found to be unresponsive requiring King airway to be placed for ventilation. He was given Solu-Medrol, albuterol, and 2 g of magnesium by EMS personnel and was promptly intubated on arrival to the emergency department. ABG concerning for profound respiratory acidosis (pH 6.9 with CO2 > 100). Staff also reported suctioning copious amounts of food like secretions from ET tube raising concern for aspiration. Patient was managed by the ICU team and transferred to the Hospitalist team after ICU course.  During the hospital stay, the patient was noted to be leaning to the left side. MRI brain revealed multiple areas of infarcts (thought to be water shed areas as per Neurology). Neurology team directed stroke work up and patient is currently awaiting rehabilitation. Patient has also completed course of antibiotics. Currently, patient is on prednisone orally, to be tapered by 10mg  every 3 days. Patient is stable for discharge. Patient is to follow up with PCP, Neurology and Vascular Surgery on discharge.   Consults: Neurology and Vascular Surgery.  Significant Diagnostic Studies: MRI and CTA brain and Neck. Carotid doppler ultrasound.  Discharge Med. Rec. - See Med. Rec.  Discharge Exam: Blood pressure 136/54, pulse 61, temperature 98.1 F (36.7 C), temperature source Oral, resp. rate 18, height 5\' 9"  (1.753 m), weight 74.753 kg (164 lb 12.8 oz), SpO2 97 %.  Disposition: Rehabilitation at a SNF      Discharge Instructions    Ambulatory referral to Neurology    Complete by:  As directed   Dr. Erlinda Hong requests follow up for this patient in 2 months.     Diet - low sodium heart healthy    Complete by:  As directed      Diet Carb Modified    Complete by:  As directed      Increase activity slowly    Complete by:  As directed   Activity as per rehab team            Medication List    STOP taking these medications        brimonidine 0.2 % ophthalmic solution  Commonly known as:  ALPHAGAN  Replaced by:  brimonidine 0.15 % ophthalmic solution     cloNIDine 0.2 MG tablet  Commonly known as:  CATAPRES     metFORMIN 500 MG tablet  Commonly known as:  GLUCOPHAGE     potassium chloride SA 20 MEQ tablet  Commonly known as:  K-DUR,KLOR-CON     promethazine 25 MG tablet  Commonly known as:  PHENERGAN      TAKE these medications        albuterol 108 (90 Base) MCG/ACT inhaler  Commonly known as:  PROVENTIL HFA;VENTOLIN HFA  Inhale 2 puffs into the lungs every 4 (four) hours as needed  for wheezing or shortness of breath.     albuterol (2.5 MG/3ML) 0.083% nebulizer solution  Commonly known as:  PROVENTIL  Take 2.5 mg by nebulization every 4 (four) hours as needed for wheezing or shortness of breath.     amLODipine 5 MG tablet  Commonly known as:  NORVASC  Take 5 mg by mouth daily.     aspirin 81 MG tablet  Take 81 mg by mouth daily.     brimonidine 0.15 % ophthalmic solution  Commonly known as:  ALPHAGAN  Place 1 drop into the left eye 2 (two) times daily.     budesonide-formoterol 160-4.5 MCG/ACT inhaler  Commonly known as:  SYMBICORT  Inhale 2 puffs into the lungs 2 (two) times daily.     cholecalciferol 1000 units tablet  Commonly known as:  VITAMIN D  Take 1,000 Units by mouth daily.     donepezil 10 MG tablet  Commonly known as:  ARICEPT  Take 10 mg by mouth at bedtime.     feeding supplement (GLUCERNA SHAKE) Liqd  Take 237 mLs by mouth 2 (two) times daily between meals.     fluticasone 50 MCG/ACT nasal spray  Commonly known as:  FLONASE  Place 2 sprays into both nostrils 2 (two) times daily as needed for allergies.     insulin glargine 100 UNIT/ML injection  Commonly known as:  LANTUS  Inject 0.1 mLs (10 Units total) into the skin daily.     losartan 100 MG tablet  Commonly known as:  COZAAR  Take 100 mg by mouth daily.     mirtazapine 7.5 MG tablet  Commonly known as:  REMERON  Take 1 tablet (7.5 mg total) by mouth at bedtime.     multivitamin with minerals Tabs tablet  Take 1 tablet by mouth daily.     omeprazole 20 MG capsule  Commonly known as:  PRILOSEC  Take 20 mg by mouth daily.     predniSONE 10 MG tablet  Commonly known as:  DELTASONE  Prednisone 60mg  po once daily for 2 days, then 40mg  po once daily for 3 days, then 30mg  po once daily for 3 days, then 20mg  po once daily for 3 days and then 10mg  po once daily for 3 days.     simvastatin 20 MG tablet  Commonly known as:  ZOCOR  Take 1 tablet (20 mg total) by mouth daily at  6 PM.     valACYclovir 500 MG tablet  Commonly known as:  VALTREX  Take 500 mg by mouth 2 (two) times daily as needed. For Canker Sores     vitamin B-12 1000 MCG tablet  Commonly known as:  CYANOCOBALAMIN  Take 1,000 mcg by mouth daily.       Follow-up Information    Follow up with Xu,Jindong, MD. Schedule an appointment as soon as possible for a visit today.   Specialty:  Neurology   Contact information:   [address] [place]  [phone]       Follow up with Curt Jews, MD In 2 weeks.   Specialties:  Vascular Surgery, Cardiology   Why:  Office will call you to arrange your appt (sent)   Contact information:   [address] [place] [address] [phone]       Follow up with [facility].   Why:  Home Health Physical Therapy   Contact information:   [address] [place] [address] [phone]       Follow up with Hoyt Koch, MD In 1 week.   Specialty:  Internal Medicine   Why:  Follow up with PCP, Neurology and Vascular Surgery   Contact information:   [place] [address] [phone]       Signed: Bonnell Public 06/09/2015, 11:02 AM

## 2015-06-09 NOTE — Progress Notes (Signed)
Called [facility] at [phone], pressed 1, and attempted to give report to receiving nurse.  Spoke to receptionist, Thereasa Solo, and he transferred my call to the "other village."  No one answered call, so I was unable to give report concerning this pt.

## 2015-06-09 NOTE — Progress Notes (Signed)
Discussed with SW. Plans are for SNF. I will sign off. [phone]

## 2015-06-09 NOTE — Clinical Social Work Note (Signed)
CSW facilitated patient discharge including contacting patient family and facility to confirm patient discharge plans. Clinical information faxed to facility and family agreeable with plan. CSW arranged ambulance transport via [place] to [facility] [phone]). RN to call report prior to discharge.  CSW will sign off for now as social work intervention is no longer needed. Please consult [place] again if new needs arise.  Dayton Scrape, [place]

## 2015-06-09 NOTE — Clinical Social Work Note (Addendum)
CSW spoke with Florentina Jenny, admissions coordinator at [facility]. She stated that she could get insurance authorization with [employer] in 30 minutes. CSW made patient and his wife aware. Patient will need PTAR services.  Dayton Scrape, [place]  Jolyne Loa, admissions coordinator at [facility] called and said that what was told in note above was not true. She stated that they could get a type of pre-screening number in that amount of time but it would not be authorization. CSW called Florentina Jenny back to confirm what Ivin Booty said and she said that they can definitely take the patient today. Rollene Fare will meet with the patient and his wife today and figure out a time to set up transport.  Dayton Scrape, [place]

## 2015-06-09 NOTE — Progress Notes (Signed)
Orders received for pt discharge to [facility].  IV removed.  Cardiac monitoring was discontinued and CCMD notified.  Family at bedside, aware of discharge.  VSS.  Pt in no acute distress, and will transport via PTAR.

## 2015-06-09 NOTE — Progress Notes (Signed)
Physical Therapy Treatment Patient Details Name: Chad [name] MRN: [id number] DOB: 04-14-1929 Today's Date: 06/09/2015    History of Present Illness  5/8 he again presented to the pulmonary clinic with worsening shortness of breath. He was felt to be suffering from COPD exacerbation. As the day progressed he became markedly more dyspneic prompting him to call EMS. Upon their arrival he was found to be unresponsive requirin airway to be placed for ventilation. He was promptly intubated on arrival to the emergency department. He self-extubated 5/10 early a.m. Noted Lt lean, LUE changes and 5/11 MRI brain showed Rt parietal, temporal, occipital and Lt parietal CVAs  PMHx- COPD, diabetes, stroke, moderate dementia with behavioral disturbance, and hypertension.     PT Comments    Patient continues to make good progress, however still requires mod assist to ambulate with RW due to strong left lean.   Follow Up Recommendations  SNF;Supervision/Assistance - 24 hour     Equipment Recommendations   (TBA)    Recommendations for Other Services       Precautions / Restrictions Precautions Precautions: Fall Precaution Comments: inattention Lt side Restrictions Weight Bearing Restrictions: No    Mobility  Bed Mobility Overal bed mobility: Needs Assistance Bed Mobility: Supine to Sit     Supine to sit: Min guard     General bed mobility comments: with rail, HOB flat  Transfers Overall transfer level: Needs assistance Equipment used: Rolling walker (2 wheeled) Transfers: Sit to/from Stand Sit to Stand: Min assist;+2 safety/equipment         General transfer comment: initiates from midline, however as achieves standing he is leaning left; can correct with cues; repeated x 3 with cues for safe hand placement each time  Ambulation/Gait Ambulation/Gait assistance: Mod assist;+2 safety/equipment Ambulation Distance (Feet): 90 Feet (seated rest; 12) Assistive device: Rolling  walker (2 wheeled) Gait Pattern/deviations: Step-to pattern;Decreased step length - left;Decreased stance time - right;Decreased weight shift to right;Drifts right/left   Gait velocity interpretation: Below normal speed for age/gender General Gait Details: Patient with increasing Lt lean as he fatigues (also drifts to the left side of RW). Requires vc and physical assistance to correct   Stairs            Wheelchair Mobility    Modified Rankin (Stroke Patients Only) Modified Rankin (Stroke Patients Only) Pre-Morbid Rankin Score: Moderately severe disability Modified Rankin: Moderately severe disability     Balance Overall balance assessment: Needs assistance Sitting-balance support: No upper extremity supported;Feet supported Sitting balance-Leahy Scale: Fair Sitting balance - Comments: in midline to very slight lt lean   Standing balance support: Bilateral upper extremity supported Standing balance-Leahy Scale: Poor Standing balance comment: left lean worsens with fatigue                    Cognition Arousal/Alertness: Awake/alert Behavior During Therapy: Flat affect Overall Cognitive Status: Impaired/Different from baseline Area of Impairment: Attention;Memory;Following commands;Safety/judgement;Awareness;Problem solving   Current Attention Level: Selective (minimally distracting environment) Memory: Decreased recall of precautions;Decreased short-term memory Following Commands: Follows one step commands with increased time Safety/Judgement: Decreased awareness of safety Awareness: Intellectual Problem Solving: Slow processing;Decreased initiation;Difficulty sequencing;Requires verbal cues;Requires tactile cues General Comments: Per wife, has moderate dementia at baseline.    Exercises General Exercises - Lower Extremity Ankle Circles/Pumps: AROM;Both;10 reps    General Comments General comments (skin integrity, edema, etc.): wife present and very  concerned that she might have to take patient home today (apparently he has bed offer at SNF,  but it is not her first choice of facility and she refused bed). Awaiting insurance approval for [facility]      Pertinent Vitals/Pain Pain Assessment: No/denies pain    Home Living                      Prior Function            PT Goals (current goals can now be found in the care plan section) Acute Rehab PT Goals Patient Stated Goal: per wife to get back to his PLOF Time For Goal Achievement: 06/11/15 Progress towards PT goals: Progressing toward goals    Frequency  Min 3X/week    PT Plan Discharge plan needs to be updated (wife cannot afford co-pay for CIR and wants SNF)    Co-evaluation             End of Session Equipment Utilized During Treatment: Gait belt Activity Tolerance: Patient limited by fatigue Patient left: in chair;with call bell/phone within reach;with chair alarm set;with family/visitor present     Time: [id number] PT Time Calculation (min) (ACUTE ONLY): 24 min  Charges:  $Gait Training: 23-37 mins                    G Codes:      Amine Adelson 06/27/15, 11:37 AM  Pager [phone]

## 2015-06-09 NOTE — Discharge Summary (Deleted)
Physician Discharge Summary  Patient ID: Chad [name] MRN: [id number] DOB/AGE: [birth year] 82 y.o.  Admit date: 06/01/2015 Discharge date: When bed is available (THIS IS AN INTERIM DISCHARGE SUMMARY)  Admission Diagnoses:  Discharge Diagnoses:  Principal Problem:   Acute hypercapnic respiratory failure ([place]) Active Problems:   COPD exacerbation ([place])   Acute encephalopathy   HCAP (healthcare-associated pneumonia)   COPD with exacerbation ([place])   Cerebral thrombosis with cerebral infarction   Acute CVA (cerebrovascular accident) [facility])   Discharged Condition: Assess at the time of discharge. Stable for discharge today.  Hospital Course: 82 year old male with past medical history significant for COPD, diabetes, stroke, moderate dementia with behavioral disturbance, and hypertension. Patient was recently admitted to [facility] in February 2017 for sepsis secondary to a left upper lobe pneumonia. He was treated with broad-spectrum antibiotics and IV fluids for hypotension and eventually discharged home. He was recently seen 05/25/2015 at the pulmonary clinic for routine follow-up at which time he felt as though his breathing was doing pretty well, however, on 06/01/2015 he presented again to the pulmonary clinic with worsening shortness of breath. He was felt to be suffering from COPD exacerbation and was prescribed Augmentin and prednisone taper. As the day progressed he became markedly more dyspneic prompting him to call EMS. Upon their arrival he was found to be unresponsive requiring King airway to be placed for ventilation. He was given Solu-Medrol, albuterol, and 2 g of magnesium by EMS personnel and was promptly intubated on arrival to the emergency department. ABG concerning for profound respiratory acidosis (pH 6.9 with CO2 > 100). Staff also reported suctioning copious amounts of food like secretions from ET tube raising concern for aspiration. Patient was managed by the ICU team and  transferred to the Hospitalist team after ICU course. During the hospital stay, the patient was noted to be leaning to the left side. MRI brain revealed multiple areas of infarcts (thought to be water shed areas as per Neurology). Neurology team directed stroke work up and patient is currently awaiting rehabilitation. Patient has also completed course of antibiotics. Currently, patient is on prednisone orally, to be tapered by 10mg  every 3 days. Patient is stable for discharge and is awaiting approval of rehabilitation bed. Patient is to follow up with PCP, Neurology and Vascular Surgery on discharge.   Consults: Neurology and Vascular Surgery.  Significant Diagnostic Studies: MRI and CTA brain and Neck. Carotid doppler ultrasound.  Discharge Med. Rec. - Needs Discharge Med. Rec. At the time of discharge.  Discharge Exam: Blood pressure 136/54, pulse 61, temperature 98.1 F (36.7 C), temperature source Oral, resp. rate 18, height 5\' 9"  (1.753 m), weight 74.753 kg (164 lb 12.8 oz), SpO2 97 %.  Disposition: Awaiting rehabilitation bed.  Discharge Instructions    Ambulatory referral to Neurology    Complete by:  As directed   Dr. Erlinda Hong requests follow up for this patient in 2 months.            Medication List    ASK your doctor about these medications        albuterol 108 (90 Base) MCG/ACT inhaler  Commonly known as:  PROVENTIL HFA;VENTOLIN HFA  Inhale 2 puffs into the lungs every 4 (four) hours as needed for wheezing or shortness of breath.     albuterol (2.5 MG/3ML) 0.083% nebulizer solution  Commonly known as:  PROVENTIL  Take 2.5 mg by nebulization every 4 (four) hours as needed for wheezing or shortness of breath.  amLODipine 5 MG tablet  Commonly known as:  NORVASC  Take 5 mg by mouth daily.     aspirin 81 MG tablet  Take 81 mg by mouth daily.     brimonidine 0.2 % ophthalmic solution  Commonly known as:  ALPHAGAN  Place 1 drop into the left eye 2 (two) times daily.      budesonide-formoterol 160-4.5 MCG/ACT inhaler  Commonly known as:  SYMBICORT  Inhale 2 puffs into the lungs 2 (two) times daily.     cholecalciferol 1000 units tablet  Commonly known as:  VITAMIN D  Take 1,000 Units by mouth daily.     cloNIDine 0.2 MG tablet  Commonly known as:  CATAPRES  Take 0.1-0.2 mg by mouth See admin instructions. To take as needed id SYSTOLIC BP is over 0000000     donepezil 10 MG tablet  Commonly known as:  ARICEPT  Take 10 mg by mouth at bedtime.     fluticasone 50 MCG/ACT nasal spray  Commonly known as:  FLONASE  Place 2 sprays into both nostrils 2 (two) times daily as needed for allergies.     losartan 100 MG tablet  Commonly known as:  COZAAR  Take 100 mg by mouth daily.     metFORMIN 500 MG tablet  Commonly known as:  GLUCOPHAGE  Take 500 mg by mouth daily with breakfast.     mirtazapine 15 MG tablet  Commonly known as:  REMERON  Take 15 mg by mouth at bedtime.     omeprazole 20 MG capsule  Commonly known as:  PRILOSEC  Take 20 mg by mouth daily.     potassium chloride SA 20 MEQ tablet  Commonly known as:  K-DUR,KLOR-CON  Take 20 mEq by mouth daily.     predniSONE 10 MG tablet  Commonly known as:  DELTASONE  Take 10 mg by mouth daily with breakfast. Tapered dose     promethazine 25 MG tablet  Commonly known as:  PHENERGAN  Take 12.5-25 mg by mouth every 6 (six) hours as needed for nausea or vomiting.     valACYclovir 500 MG tablet  Commonly known as:  VALTREX  Take 500 mg by mouth 2 (two) times daily as needed. For Canker Sores     vitamin B-12 1000 MCG tablet  Commonly known as:  CYANOCOBALAMIN  Take 1,000 mcg by mouth daily.           Follow-up Information    Follow up with Xu,Jindong, MD. Schedule an appointment as soon as possible for a visit today.   Specialty:  Neurology   Contact information:   [address] [place] [address] [phone]       Follow up with Curt Jews, MD In 2 weeks.    Specialties:  Vascular Surgery, Cardiology   Why:  Office will call you to arrange your appt (sent)   Contact information:   [place] [address] [phone]       Follow up with [facility].   Why:  Home Health Physical Therapy   Contact information:   [address] [place] [address] [phone]       Signed: Bonnell Public 06/09/2015, 9:29 AM

## 2015-06-09 NOTE — Clinical Social Work Placement (Signed)
   CLINICAL SOCIAL WORK PLACEMENT  NOTE  Date:  06/09/2015  Patient Details  Name: Chad [name] MRN: [id number] Date of Birth: 11-14-1929  Clinical Social Work is seeking post-discharge placement for this patient at the [place] level of care (*CSW will initial, date and re-position this form in  chart as items are completed):  Yes   Patient/family provided with [employer] Work Department's list of facilities offering this level of care within the geographic area requested by the patient (or if unable, by the patient's family).  Yes   Patient/family informed of their freedom to choose among providers that offer the needed level of care, that participate in Medicare, Medicaid or managed care program needed by the patient, have an available bed and are willing to accept the patient.  Yes   Patient/family informed of [facility]'s ownership interest in [facility] and [facility], as well as of the fact that they are under no obligation to receive care at these facilities.  PASRR submitted to EDS on       PASRR number received on 06/05/15     Existing PASRR number confirmed on       FL2 transmitted to all facilities in geographic area requested by pt/family on       FL2 transmitted to all facilities within larger geographic area on       Patient informed that his/her managed care company has contracts with or will negotiate with certain facilities, including the following:        Yes   Patient/family informed of bed offers received.  Patient chooses bed at [facility]     Physician recommends and patient chooses bed at      Patient to be transferred to [facility] on 06/09/15.  Patient to be transferred to facility by [facility]     Patient family notified on 06/09/15 of transfer.  Name of family member notified:  Patsy Burroughs     PHYSICIAN Please prepare prescriptions, Please sign FL2     Additional Comment:     _______________________________________________ Candie Chroman, LCSW 06/09/2015, 10:59 AM

## 2015-06-09 NOTE — Progress Notes (Signed)
PROGRESS NOTE    Chad [name]  [id number] DOB: December 18, 1929 DOA: 06/01/2015 PCP: Hoyt Koch, MD  Outpatient Specialists:   Brief Narrative: 82 year old male past medical history significant for COPD, diabetes, stroke, moderate dementia with behavioral disturbance, and hypertension. Patient was recently admitted to [facility] in February 2017 for sepsis secondary to a left upper lobe pneumonia. He was treated with broad-spectrum antibiotics and IV fluids for hypotension and eventually discharged home. He was recently seen 5/1 the pulmonary clinic for routine follow-up at which time he felt as though his breathing was doing pretty well, however, 5/8 he again presented to the pulmonary clinic with worsening shortness of breath. He was felt to be suffering from COPD exacerbation and was prescribed Augmentin and prednisone taper. As the day progressed he became markedly more dyspneic prompting him to call EMS. Upon their arrival he was found to be unresponsive requiring King airway to be placed for ventilation. He was given Solu-Medrol, albuterol, and 2 g of magnesium by EMS personnel and was promptly intubated on arrival to the emergency department. ABG concerning for profound respiratory acidosis (pH 6.9 with CO2 > 100). Staff also reportedly suctioning copious amounts of food like secretions from ET tube raising concern for aspiration. Patient was transferred to the Hospitalist team after ICU course. Reported to be leaning to the left side. MRI brain revealed multiple areas of infarcts (thought to be water shed areas as per Neurology). Neurology work up is still in progress. CTA brain and Neck result noted, and Vascular surgery consulted.  Assessment & Plan:   Principal Problem:   Acute hypercapnic respiratory failure (H[facility]) Active Problems:   COPD exacerbation ([facility])   Acute encephalopathy   HCAP (healthcare-associated pneumonia)   COPD with exacerbation ([facility])   Cerebral thrombosis  with cerebral infarction   Acute CVA (cerebrovascular accident) ([place])  - Acute CVA: Still awaiting rehab. - Acute on chronic hypercarbic respiratory failure secondary to COPD exacerbation: Resolved. - Probable HCAP/ LLL atelectasis Completed course of antibiotics.   -HTN, HLD - Historical. Optimize. - Elevated trop -demand ischemia versus prognostic significance indicating severity of illness.  -  AKI versus AKI on CKD - Stable. Continue to monitor. Last Scr was 1.61. Repeat BMP today. -  Acute metabolic encephalopathy - Resolved. Baseline dementia noted.   FAMILY -wife   DVT prophylaxis:  Heparin  Family Communication: Wife updated today. Disposition Plan: Home in 1-2 days. DC foley's catheter.  Consultants:   ICU transfer.  Procedures: Intubation and self extubation. Antimicrobials: IV Zosyn (Completed course).  Subjective: No new complaints. No fever or chills. No SOB or chest pain.  Objective: Filed Vitals:   06/07/15 2123 06/08/15 0611 06/08/15 2011 06/09/15 0332  BP: 150/66 163/68 140/60 136/54  Pulse: 82 58 64 61  Temp: 98 F (36.7 C) 98.3 F (36.8 C) 98.1 F (36.7 C) 98.1 F (36.7 C)  TempSrc: Oral Oral Oral Oral  Resp: 18 18 18 18   Height:      Weight:  74.345 kg (163 lb 14.4 oz)  74.753 kg (164 lb 12.8 oz)  SpO2: 100% 97% 97% 97%    Intake/Output Summary (Last 24 hours) at 06/09/15 0922 Last data filed at 06/09/15 0626  Gross per 24 hour  Intake 4124.84 ml  Output   1300 ml  Net 2824.84 ml   Filed Weights   06/07/15 0500 06/08/15 0611 06/09/15 0332  Weight: 73.528 kg (162 lb 1.6 oz) 74.345 kg (163 lb 14.4 oz) 74.753 kg (164 lb  12.8 oz)    Examination:  General exam: Not in distress.  Respiratory system: Good air entry. Cardiovascular system: S1 & S2. Gastrointestinal system: Abdomen is nondistended, soft and nontender.   Central nervous system: Awake and Alert. Very minimal LUE weakness elicited. Extremities: No edema.  Data Reviewed: I  have personally reviewed following labs and imaging studies  CBC:  Recent Labs Lab 06/03/15 0222 06/05/15 0441  WBC 16.8* 8.8  NEUTROABS  --  5.8  HGB 10.6* 10.8*  HCT 33.8* 34.2*  MCV 82.6 83.8  PLT 207 [id number]   Basic Metabolic Panel:  Recent Labs Lab 06/03/15 0222 06/05/15 0441 06/08/15 0303  NA 142 142 136  K 3.8 3.7 4.2  CL 109 105 101  CO2 20* 25 26  GLUCOSE 214* 133* 188*  BUN 24* 22* 34*  CREATININE 1.38* 1.48* 1.61*  CALCIUM 8.6* 9.1 9.7  PHOS  --  4.0  --    GFR: Estimated Creatinine Clearance: 34.8 mL/min (by C-G formula based on Cr of 1.61). Liver Function Tests:  Recent Labs Lab 06/05/15 0441  ALBUMIN 3.0*   No results for input(s): LIPASE, AMYLASE in the last 168 hours. No results for input(s): AMMONIA in the last 168 hours. Coagulation Profile: No results for input(s): INR, PROTIME in the last 168 hours. Cardiac Enzymes:  Recent Labs Lab 06/02/15 1040  TROPONINI 0.63*   BNP (last 3 results) No results for input(s): PROBNP in the last 8760 hours. HbA1C: No results for input(s): HGBA1C in the last 72 hours. CBG:  Recent Labs Lab 06/08/15 0629 06/08/15 1142 06/08/15 1645 06/08/15 2103 06/09/15 0642  GLUCAP 164* 177* 132* 158* 126*   Lipid Profile: No results for input(s): CHOL, HDL, LDLCALC, TRIG, CHOLHDL, LDLDIRECT in the last 72 hours. Thyroid Function Tests: No results for input(s): TSH, T4TOTAL, FREET4, T3FREE, THYROIDAB in the last 72 hours. Anemia Panel: No results for input(s): VITAMINB12, FOLATE, FERRITIN, TIBC, IRON, RETICCTPCT in the last 72 hours. Urine analysis:    Component Value Date/Time   COLORURINE YELLOW 06/01/2015 1923   APPEARANCEUR CLOUDY* 06/01/2015 1923   LABSPEC 1.018 06/01/2015 1923   PHURINE 6.0 06/01/2015 1923   GLUCOSEU 500* 06/01/2015 1923   HGBUR SMALL* 06/01/2015 [place] NEGATIVE 06/01/2015 [place] NEGATIVE 06/01/2015 1923   PROTEINUR 100* 06/01/2015 1923   NITRITE NEGATIVE  06/01/2015 1923   LEUKOCYTESUR NEGATIVE 06/01/2015 1923   Sepsis Labs: @LABRCNTIP (procalcitonin:4,lacticidven:4)  ) Recent Results (from the past 240 hour(s))  Blood Culture (routine x 2)     Status: None   Collection Time: 06/01/15  6:35 PM  Result Value Ref Range Status   Specimen Description BLOOD RIGHT ANTECUBITAL  Final   Special Requests BOTTLES DRAWN AEROBIC ONLY 5CC  Final   Culture NO GROWTH 5 DAYS  Final   Report Status 06/06/2015 FINAL  Final  Blood Culture (routine x 2)     Status: None   Collection Time: 06/01/15  6:40 PM  Result Value Ref Range Status   Specimen Description BLOOD LEFT ANTECUBITAL  Final   Special Requests BOTTLES DRAWN AEROBIC ONLY 5CC  Final   Culture NO GROWTH 5 DAYS  Final   Report Status 06/06/2015 FINAL  Final  Urine culture     Status: None   Collection Time: 06/01/15  7:10 PM  Result Value Ref Range Status   Specimen Description URINE, CATHETERIZED  Final   Special Requests NONE  Final   Culture NO GROWTH 2 DAYS  Final  Report Status 06/03/2015 FINAL  Final  MRSA PCR Screening     Status: None   Collection Time: 06/01/15  9:49 PM  Result Value Ref Range Status   MRSA by PCR NEGATIVE NEGATIVE Final    Comment:        The GeneXpert MRSA Assay (FDA approved for NASAL specimens only), is one component of a comprehensive MRSA colonization surveillance program. It is not intended to diagnose MRSA infection nor to guide or monitor treatment for MRSA infections.   Culture, bal-quantitative     Status: Abnormal   Collection Time: 06/02/15  2:20 PM  Result Value Ref Range Status   Specimen Description BRONCHIAL ALVEOLAR LAVAGE  Final   Special Requests NONE  Final   Gram Stain   Final    ABUNDANT WBC PRESENT, PREDOMINANTLY PMN NO SQUAMOUS EPITHELIAL CELLS SEEN FEW YEAST Performed at [facility]    Culture (A)  Final    PSEUDOMONAS AERUGINOSA STAPHYLOCOCCUS AUREUS Note: RIFAMPIN AND GENTAMICIN SHOULD NOT BE USED AS SINGLE  DRUGS FOR TREATMENT OF STAPH INFECTIONS. This organism DOES NOT demonstrate inducible Clindamycin resistance in vitro. YEAST CONSISTENT WITH CANDIDA SPECIES Performed at [facility]    Report Status 06/07/2015 FINAL  Final   Organism ID, Bacteria PSEUDOMONAS AERUGINOSA  Final   Organism ID, Bacteria STAPHYLOCOCCUS AUREUS  Final      Susceptibility   Staphylococcus aureus - MIC*    CLINDAMYCIN <=0.25 SENSITIVE Sensitive     ERYTHROMYCIN >=8 RESISTANT Resistant     GENTAMICIN <=0.5 SENSITIVE Sensitive     LEVOFLOXACIN 0.25 SENSITIVE Sensitive     OXACILLIN 0.5 SENSITIVE Sensitive     RIFAMPIN <=0.5 SENSITIVE Sensitive     TRIMETH/SULFA <=10 SENSITIVE Sensitive     VANCOMYCIN 1 SENSITIVE Sensitive     TETRACYCLINE <=1 SENSITIVE Sensitive     MOXIFLOXACIN <=0.25 SENSITIVE Sensitive     * STAPHYLOCOCCUS AUREUS   Pseudomonas aeruginosa - MIC*    CEFEPIME <=1 SENSITIVE Sensitive     CEFTAZIDIME 2 SENSITIVE Sensitive     CIPROFLOXACIN <=0.25 SENSITIVE Sensitive     GENTAMICIN <=1 SENSITIVE Sensitive     IMIPENEM 2 SENSITIVE Sensitive     PIP/TAZO <=4 SENSITIVE Sensitive     TOBRAMYCIN <=1 SENSITIVE Sensitive     * PSEUDOMONAS AERUGINOSA         Radiology Studies: No results found.      Scheduled Meds: . amLODipine  5 mg Oral Daily  . aspirin EC  81 mg Oral Daily  . brimonidine  1 drop Left Eye BID  . cholecalciferol  1,000 Units Oral Daily  . donepezil  10 mg Oral QHS  . feeding supplement (GLUCERNA SHAKE)  237 mL Oral BID BM  . heparin  5,000 Units Subcutaneous Q8H  . insulin aspart  0-5 Units Subcutaneous QHS  . insulin aspart  0-9 Units Subcutaneous TID WC  . insulin glargine  10 Units Subcutaneous Daily  . losartan  100 mg Oral Daily  . mirtazapine  7.5 mg Oral QHS  . multivitamin with minerals  1 tablet Oral Daily  . piperacillin-tazobactam (ZOSYN)  IV  3.375 g Intravenous Q8H  . predniSONE  60 mg Oral Q breakfast  . simvastatin  20 mg Oral q1800  .  vitamin B-12  1,000 mcg Oral Daily   Continuous Infusions: . sodium chloride 50 mL/hr at 06/07/15 1712     LOS: 8 days    Time spent: 20 Mins    Yehuda Savannah  Matilde Sprang, MD [facility] Pager [phone].  If 7PM-7AM, please contact night-coverage [email or web address] Password [email or web address] 06/09/2015, 9:22 AM

## 2015-06-10 ENCOUNTER — Encounter: Payer: Self-pay | Admitting: Adult Health

## 2015-06-11 ENCOUNTER — Telehealth: Payer: Self-pay | Admitting: *Deleted

## 2015-06-11 NOTE — Telephone Encounter (Signed)
Called to make Chad [name] call wife states husband will be f/u w Dr. Donnetta Hutching 06/24/15. Will wait on appt with MD do not need to see her...[name]

## 2015-06-12 ENCOUNTER — Non-Acute Institutional Stay (SKILLED_NURSING_FACILITY): Payer: Medicare HMO | Admitting: Adult Health

## 2015-06-12 ENCOUNTER — Encounter: Payer: Self-pay | Admitting: Adult Health

## 2015-06-12 DIAGNOSIS — J441 Chronic obstructive pulmonary disease with (acute) exacerbation: Secondary | ICD-10-CM

## 2015-06-12 DIAGNOSIS — I639 Cerebral infarction, unspecified: Secondary | ICD-10-CM | POA: Diagnosis not present

## 2015-06-12 DIAGNOSIS — N182 Chronic kidney disease, stage 2 (mild): Secondary | ICD-10-CM | POA: Diagnosis not present

## 2015-06-12 DIAGNOSIS — E1122 Type 2 diabetes mellitus with diabetic chronic kidney disease: Secondary | ICD-10-CM | POA: Diagnosis not present

## 2015-06-12 DIAGNOSIS — E1169 Type 2 diabetes mellitus with other specified complication: Secondary | ICD-10-CM | POA: Diagnosis not present

## 2015-06-12 DIAGNOSIS — E785 Hyperlipidemia, unspecified: Secondary | ICD-10-CM

## 2015-06-12 DIAGNOSIS — E118 Type 2 diabetes mellitus with unspecified complications: Secondary | ICD-10-CM | POA: Insufficient documentation

## 2015-06-12 DIAGNOSIS — K219 Gastro-esophageal reflux disease without esophagitis: Secondary | ICD-10-CM

## 2015-06-12 DIAGNOSIS — F03B18 Unspecified dementia, moderate, with other behavioral disturbance: Secondary | ICD-10-CM

## 2015-06-12 DIAGNOSIS — F0391 Unspecified dementia with behavioral disturbance: Secondary | ICD-10-CM

## 2015-06-12 NOTE — Progress Notes (Signed)
Location:  [facility] Room Number: 1206 P Place of Service:  SNF (31)   CODE STATUS: full code   Allergies  Allergen Reactions  . Avelox [Moxifloxacin Hcl In Nacl] Anaphylaxis  . Moxifloxacin Anaphylaxis    Avelox REACTION: anaphylaxis  . Quinolones Anaphylaxis  . Sulfonamide Derivatives     REACTION: hives  . Timolol Maleate     Causes severe chest congestion  . Timoptic [Timolol Maleate] Other (See Comments)    Congestion  . Sulfa Antibiotics Rash    Chief Complaint  Patient presents with  . Hospitalization Follow-up    HPI:  He has been hospitalized for an excerebration of his copd; he did require intubation. Per his family he was not responsive when EMS arrived. He also suffered a cva. Per his family he does have coordination issues. The goal of his care is for him to return back home.   Past Medical History  Diagnosis Date  . Fatty liver disease, nonalcoholic   . Coronary artery stenosis   . Diverticulosis of colon   . Diaphragmatic hernia without mention of obstruction or gangrene   . Dyspnea   . Personal history of other malignant neoplasm of skin   . Barrett's esophagus   . TIA (transient ischemic attack) 1998 X "a few"  . Hyperlipidemia   . Acid reflux disease   . Glaucoma   . HTN (hypertension)   . RENAL CALCULUS, HX OF 08/28/2008  . CAROTID ARTERY STENOSIS, BILATERAL 08/27/2008  . Esophageal stenosis   . COPD (chronic obstructive pulmonary disease) ([place])     "severe" (12/20/2013)  . Chronic bronchitis ([place])     "most q yr; he's had it several times" (12/20/2013)  . Pneumonia 1990's X 4  . Type II diabetes mellitus ([place])   . History of blood transfusion 2007    "related to OR"  . History of hiatal hernia     "repaired when esophagus removed"  . History of stomach ulcers   . Sinus headache     "seasonal"  . Arthritis     "all over"  . Kidney stones     "passed them all" (12/20/2013)  . Basal cell carcinoma of  nose   . Diabetes mellitus without complication ([place])   . COPD (chronic obstructive pulmonary disease) ([place])   . Stroke ([place])   . Hypertension   . Glaucoma   . Diverticulitis   . GERD (gastroesophageal reflux disease)   . Arthritis   . High cholesterol   . Renal disorder     Past Surgical History  Procedure Laterality Date  . Esophagectomy  2007    with stomach pull through  . Pars plana vitrectomy Bilateral 01/2007-05/2007  . Ventral hernia repair  02/2006  . Knee arthroscopy Bilateral 04/1989; 08/1996; 06/1999    "right; left; left"  . Carotid endarterectomy Bilateral 02/1996-03/1996  . Shoulder arthroscopy Right 04/1998  . Mohs surgery  ?2002    "tip of nose; basal cell"  . Nasal polyp excision  2005    "benign"  . Hernia repair    . Cataract extraction w/ intraocular lens implant Bilateral 04/2007 - 09/2007    "left-right"  . Glaucoma surgery Right 11/2008    "implanted drain tube"  . Esophagogastroduodenoscopy (egd) with esophageal dilation  10/2011  . Eye surgery Right 04/2006; 08/2007    "had gas bubbles put in"  . Eye surgery Right 01/2007    "air bubble"  . Eye  surgery Left 12/12/2013    "cleaned his implant w/laser"  . Knee surgery    . Carotid artery angioplasty    . Shoulder surgery    . Basal cell carcinoma excision    . Eye surgery      Social History   Social History  . Marital Status: Married    Spouse Name: N/A  . Number of Children: 2  . Years of Education: N/A   Occupational History  . retired Automotive engineer    Social History Main Topics  . Smoking status: Former Smoker -- 1.00 packs/day for 40 years    Types: Cigarettes    Quit date: 01/24/1978  . Smokeless tobacco: Not on file  . Alcohol Use: No  . Drug Use: No  . Sexual Activity: Not Currently   Other Topics Concern  . Not on file   Social History Narrative   ** Merged History Encounter **       Family History  Problem Relation Age of Onset  . Colon cancer Mother   . Lung  cancer Mother   . Heart attack Mother   . Colon cancer Maternal Grandfather   . Heart attack Brother   . Heart attack Brother   . Stomach cancer Father   . Esophageal cancer Father       VITAL SIGNS BP 154/63 mmHg  Pulse 56  Temp(Src) 98.2 F (36.8 C) (Oral)  Resp 18  Ht 5\' 9"  (1.753 m)  Wt 164 lb 12.8 oz (74.753 kg)  BMI 24.33 kg/m2  SpO2 96%  Patient's Medications  New Prescriptions   No medications on file  Previous Medications   ALBUTEROL (PROVENTIL HFA;VENTOLIN HFA) 108 (90 BASE) MCG/ACT INHALER    Inhale 2 puffs into the lungs every 4 (four) hours as needed for wheezing or shortness of breath.   ALBUTEROL (PROVENTIL) (2.5 MG/3ML) 0.083% NEBULIZER SOLUTION    Take 2.5 mg by nebulization every 4 (four) hours as needed for wheezing or shortness of breath.   AMBULATORY NON FORMULARY MEDICATION    Med Pass: Give 120 ml Twice daily between meals as supplement.   AMLODIPINE (NORVASC) 5 MG TABLET    TAKE 1 TABLET(5 MG) BY MOUTH DAILY   ASPIRIN 81 MG TABLET    Take 81 mg by mouth daily.     BRIMONIDINE (ALPHAGAN) 0.15 % OPHTHALMIC SOLUTION    Place 1 drop into the left eye 2 (two) times daily.   BUDESONIDE-FORMOTEROL (SYMBICORT) 160-4.5 MCG/ACT INHALER    Inhale 2 puffs into the lungs 2 (two) times daily.   CHOLECALCIFEROL (VITAMIN D) 1000 UNITS CAPSULE    Take 1,000 Units by mouth daily.     DONEPEZIL (ARICEPT) 10 MG TABLET    Take 10 mg by mouth at bedtime.   FEEDING SUPPLEMENT, GLUCERNA SHAKE, (GLUCERNA SHAKE) LIQD    Take 237 mLs by mouth 2 (two) times daily between meals.   FLUTICASONE (FLONASE) 50 MCG/ACT NASAL SPRAY    Place 2 sprays into both nostrils 2 (two) times daily as needed for allergies.    INSULIN GLARGINE (LANTUS) 100 UNIT/ML INJECTION    Inject 0.1 mLs (10 Units total) into the skin daily.   LOSARTAN (COZAAR) 100 MG TABLET    TAKE 1 TABLET(100 MG) BY MOUTH DAILY   MIRTAZAPINE (REMERON) 15 MG TABLET    Take 1 tablet (15 mg total) by mouth at bedtime.   MULTIPLE  VITAMIN (MULTIVITAMIN WITH MINERALS) TABS TABLET    Take 1 tablet by mouth  daily.   OMEPRAZOLE (PRILOSEC) 20 MG CAPSULE    Take 20 mg by mouth daily.   PREDNISONE, PAK, PO    Take 40 mg po daily until 06/14/15. Take 30 mg po daily until 06/17/15. Take 20 mg po daily until 06/20/15. Take 10 mg po daily until 06/23/15, then stop.   SIMVASTATIN (ZOCOR) 20 MG TABLET    Take 1 tablet (20 mg total) by mouth daily at 6 PM.   VALACYCLOVIR (VALTREX) 500 MG TABLET    Take 500 mg by mouth 2 (two) times daily as needed. For Canker Sores   VITAMIN B-12 (CYANOCOBALAMIN) 1000 MCG TABLET    Take 1,000 mcg by mouth daily.  Modified Medications   No medications on file  Discontinued Medications     SIGNIFICANT DIAGNOSTIC EXAMS  06-05-15: TEE: - Left ventricle: The cavity size was normal. Wall thickness was increased in a pattern of mild LVH. Systolic function was vigorous. The estimated ejection fraction was in the range of 65% to 70%. Wall motion was normal; there were no regional wall motion abnormalities. Doppler parameters are consistent with abnormal left ventricular relaxation (grade 1 diastolic dysfunction). - Aortic valve: There was no stenosis. - Mitral valve: There was no significant regurgitation. - Right ventricle: The cavity size was normal. Systolic function was normal. - Tricuspid valve: Peak RV-RA gradient (S): 41 mm Hg. - Pulmonary arteries: PA peak pressure: 44 mm Hg (S). - Inferior vena cava: The vessel was normal in size. The respirophasic diameter changes were in the normal range (>= 50%), consistent with normal central venous pressure. Impressions: - Normal LV size with mild LV hypertrophy. EF 65-70%. Normal RV size and systolic function. Mild pulmonary hypertension.  06-05-15: bilateral lower extremity doppler: - No evidence of deep vein or superficial thrombosis involving the right lower extremity and left lower extremity. - No evidence of Baker&'s cyst on the right or left.  06-05-15: ct  angio of head and neck: 1. Negative for emergent large vessel occlusion associated with the acute right cerebral infarcts. There is a paucity of posterior right MCA M3 branches, but no M2 branch occlusion identified. 2. Complex atherosclerotic plaque at the right ICA origin and bulb with some ulceration. Distal bulb level stenosis which might be hemodynamically significant (estimated at up to 65%). Correlation with right side carotid Doppler ultrasound may be valuable. 3. Previous left carotid surgery.  Calcified plaque but no stenosis. 4. 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 5. Dominant appearing left vertebral artery with calcified plaque and mild stenosis at its origin. Otherwise negative posterior circulation. 6. Expected evolution of the larger ischemic foci demonstrated yesterday by MRI. No associated hemorrhage or mass effect. 7. Patulous thoracic esophagus containing fluid/debris. Consider achalasia. 8. Advanced cervical spine degeneration. Areas of ankylosis in the upper thoracic spine.   06-06-15: carotid doppler: Right: moderate mixed plaque with some acoustic shadowing noted at the ICA origin. 1-39% ICA stenosis. Vertebral artery flow is antegrade with abnormal waveform. Left: moderate mixed plaque noted in the CEA. 1-39% ICA plaquing. Vertebral artery flow is antegrade.    LABS REVIEWED:   06-05-15: wbc 8.8; hgb 10.8; hct 34.2; mcv 83.9; plt 199; glucose 133; bun 22; creat 1.48; k+ 3.7; na++142; phos 4.0; albumin 3.0; chol 162; ldl 95; trig 106; hdl 46; hgb a1c 8.3; tsh 1.326; vit B 12: 2099 06-08-15: glucose 188; bun 34; creat 1.61; k+ 4.2; na++136  Review of Systems  Constitutional: Negative for malaise/fatigue.  Respiratory: Negative for cough and shortness of breath.   Cardiovascular: Negative for chest pain, palpitations and leg swelling.  Gastrointestinal:  Negative for heartburn, abdominal pain and constipation.  Musculoskeletal: Negative for myalgias, back pain and joint pain.  Skin: Negative.   Neurological: Negative for dizziness.  Psychiatric/Behavioral: The patient is not nervous/anxious.      Physical Exam  Constitutional: He appears well-developed and well-nourished. No distress.  Eyes: Conjunctivae are normal.  Neck: Neck supple. No JVD present. No thyromegaly present.  Cardiovascular: Normal rate, regular rhythm and intact distal pulses.   Respiratory: Effort normal and breath sounds normal. No respiratory distress. He has no wheezes.  GI: Soft. Bowel sounds are normal. He exhibits no distension. There is no tenderness.  Musculoskeletal: He exhibits no edema.  Able to move all extremities   Lymphadenopathy:    He has no cervical adenopathy.  Neurological: He is alert.  Skin: Skin is warm and dry. He is not diaphoretic.  Psychiatric: He has a normal mood and affect.       ASSESSMENT/ PLAN:  1. COPD with exacerbation: will complete prednisone taper; will continue symbicort 1504.5 mg 2 puffs twice daily; flonase as needed  will continue albuterol neb or 2 puffs every 4 hours as needed and will monitor   2. Hypertension: will continue norvasc 5 mg daily cozaar 100 mg daily asa 81 mg daily   3. Dementia: no significant change in her status; will continue aricept 10 mg nightly   4. Dyslipidemia: will continue zocor 20 mg daily ldl is 95  5. Glaucoma; will continue alphagan to both eyes twice daily   6. Depression will continue remeron 15 mg nightly   7. CVA:  Is neurologically stable will continue asa 81 mg daily   8. Diabetes: hgb a1c is 8.3; will continue lantus 10 units daily   9. Gerd: will continue prilosec 20 mg daily    Time spent with patient  50  minutes >50% time spent counseling; reviewing medical record; tests; labs; and developing future plan of care     Ok Edwards NP [facility]    Contact [phone] Monday through Friday 8am- 5pm  After hours call [phone]

## 2015-06-14 ENCOUNTER — Other Ambulatory Visit: Payer: Self-pay | Admitting: Internal Medicine

## 2015-06-15 ENCOUNTER — Other Ambulatory Visit: Payer: Self-pay | Admitting: *Deleted

## 2015-06-15 ENCOUNTER — Encounter: Payer: Self-pay | Admitting: Internal Medicine

## 2015-06-15 ENCOUNTER — Non-Acute Institutional Stay (SKILLED_NURSING_FACILITY): Payer: Medicare HMO | Admitting: Internal Medicine

## 2015-06-15 DIAGNOSIS — M7989 Other specified soft tissue disorders: Secondary | ICD-10-CM

## 2015-06-15 DIAGNOSIS — K219 Gastro-esophageal reflux disease without esophagitis: Secondary | ICD-10-CM

## 2015-06-15 DIAGNOSIS — F418 Other specified anxiety disorders: Secondary | ICD-10-CM

## 2015-06-15 DIAGNOSIS — N182 Chronic kidney disease, stage 2 (mild): Secondary | ICD-10-CM | POA: Diagnosis not present

## 2015-06-15 DIAGNOSIS — I10 Essential (primary) hypertension: Secondary | ICD-10-CM | POA: Diagnosis not present

## 2015-06-15 DIAGNOSIS — E785 Hyperlipidemia, unspecified: Secondary | ICD-10-CM

## 2015-06-15 DIAGNOSIS — J441 Chronic obstructive pulmonary disease with (acute) exacerbation: Secondary | ICD-10-CM

## 2015-06-15 DIAGNOSIS — G3183 Dementia with Lewy bodies: Secondary | ICD-10-CM

## 2015-06-15 DIAGNOSIS — E1122 Type 2 diabetes mellitus with diabetic chronic kidney disease: Secondary | ICD-10-CM | POA: Diagnosis not present

## 2015-06-15 DIAGNOSIS — F0280 Dementia in other diseases classified elsewhere without behavioral disturbance: Secondary | ICD-10-CM | POA: Diagnosis not present

## 2015-06-15 DIAGNOSIS — I639 Cerebral infarction, unspecified: Secondary | ICD-10-CM | POA: Diagnosis not present

## 2015-06-15 DIAGNOSIS — R5381 Other malaise: Secondary | ICD-10-CM | POA: Diagnosis not present

## 2015-06-15 MED ORDER — HYDROCODONE-ACETAMINOPHEN 5-325 MG PO TABS: ORAL_TABLET | ORAL | Status: DC

## 2015-06-15 NOTE — Progress Notes (Signed)
LOCATION: [facility]  PCP: Hoyt Koch, MD   Code Status: Full Code  Goals of care: Advanced Directive information Advanced Directives 06/12/2015  Does patient have an advance directive? No  Type of Advance Directive -  Would patient like information on creating an advanced directive? -       Extended Emergency Contact Information Primary Emergency Contact: Boxell,Patsy Address: [place], [place] Mobile Phone: [phone] Relation: Spouse Secondary Emergency Contact: Arth,Patsy Address: [place], [place] [address] [place] of [place] Phone: [phone] Relation: Spouse   Allergies  Allergen Reactions  . Avelox [Moxifloxacin Hcl In Nacl] Anaphylaxis  . Moxifloxacin Anaphylaxis    Avelox REACTION: anaphylaxis  . Quinolones Anaphylaxis  . Sulfonamide Derivatives     REACTION: hives  . Timolol Maleate     Causes severe chest congestion  . Timoptic [Timolol Maleate] Other (See Comments)    Congestion  . Sulfa Antibiotics Rash    Chief Complaint  Patient presents with  . New Admit To SNF    New Admission     HPI:  Patient is a 82 y.o. male seen today for short term rehabilitation post hospital admission from 06/01/15-06/09/15 with acute hypercapnic respiratory failure from copd exacerbation. He required intubation, steroids and antibiotics. He then had acute CVA with left sided weakness and was seen by neurology team. He has PMH of COPD, CVA, diabetes, moderate dementia, HTN among others. He is seen in his room today with his wife at bedside. He feels low and would like to go home. He understands that he will benefit from therapy here and agrees to stay for now on further counselling. He had a fall on Friday night and hurt his forefinger.   Review of Systems:  Constitutional: Negative for fever, chills, diaphoresis. Energy level is improving.  HENT: Negative for headache,  congestion, hearing loss, sore throat, difficulty swallowing. Positive for chronic nasal discharge.   Eyes: Negative for eye pain and discharge.  Respiratory: Negative for shortness of breath and wheezing. Positive for cough with clear phlegm.   Cardiovascular: Negative for chest pain, palpitations, leg swelling.  Gastrointestinal: Negative for heartburn, nausea, vomiting, abdominal pain, loss of appetite. Last bowel movement was yesterday. Regular stool.  Genitourinary: Negative for dysuria and flank pain.  Musculoskeletal: Negative for back pain  Skin: Negative for itching, rash.  Neurological: Negative for dizziness. Psychiatric/Behavioral: Negative for anxiety   Past Medical History  Diagnosis Date  . Fatty liver disease, nonalcoholic   . Coronary artery stenosis   . Diverticulosis of colon   . Diaphragmatic hernia without mention of obstruction or gangrene   . Dyspnea   . Personal history of other malignant neoplasm of skin   . Barrett's esophagus   . TIA (transient ischemic attack) 1998 X "a few"  . Hyperlipidemia   . Acid reflux disease   . Glaucoma   . HTN (hypertension)   . RENAL CALCULUS, HX OF 08/28/2008  . CAROTID ARTERY STENOSIS, BILATERAL 08/27/2008  . Esophageal stenosis   . COPD (chronic obstructive pulmonary disease) ([place])     "severe" (12/20/2013)  . Chronic bronchitis ([place])     "most q yr; he's had it several times" (12/20/2013)  . Pneumonia 1990's X 4  . Type II diabetes mellitus ([place])   . History of blood transfusion 2007    "related to  OR"  . History of hiatal hernia     "repaired when esophagus removed"  . History of stomach ulcers   . Sinus headache     "seasonal"  . Arthritis     "all over"  . Kidney stones     "passed them all" (12/20/2013)  . Basal cell carcinoma of nose   . Diabetes mellitus without complication ([place])   . COPD (chronic obstructive pulmonary disease) ([facility])   . Stroke ([place])   . Hypertension   . Glaucoma   . Diverticulitis   .  GERD (gastroesophageal reflux disease)   . Arthritis   . High cholesterol   . Renal disorder    Past Surgical History  Procedure Laterality Date  . Esophagectomy  2007    with stomach pull through  . Pars plana vitrectomy Bilateral 01/2007-05/2007  . Ventral hernia repair  02/2006  . Knee arthroscopy Bilateral 04/1989; 08/1996; 06/1999    "right; left; left"  . Carotid endarterectomy Bilateral 02/1996-03/1996  . Shoulder arthroscopy Right 04/1998  . Mohs surgery  ?2002    "tip of nose; basal cell"  . Nasal polyp excision  2005    "benign"  . Hernia repair    . Cataract extraction w/ intraocular lens implant Bilateral 04/2007 - 09/2007    "left-right"  . Glaucoma surgery Right 11/2008    "implanted drain tube"  . Esophagogastroduodenoscopy (egd) with esophageal dilation  10/2011  . Eye surgery Right 04/2006; 08/2007    "had gas bubbles put in"  . Eye surgery Right 01/2007    "air bubble"  . Eye surgery Left 12/12/2013    "cleaned his implant w/laser"  . Knee surgery    . Carotid artery angioplasty    . Shoulder surgery    . Basal cell carcinoma excision    . Eye surgery     Social History:   reports that he quit smoking about 37 years ago. His smoking use included Cigarettes. He has a 40 pack-year smoking history. He does not have any smokeless tobacco history on file. He reports that he does not drink alcohol or use illicit drugs.  Family History  Problem Relation Age of Onset  . Colon cancer Mother   . Lung cancer Mother   . Heart attack Mother   . Colon cancer Maternal Grandfather   . Heart attack Brother   . Heart attack Brother   . Stomach cancer Father   . Esophageal cancer Father     Medications:   Medication List       This list is accurate as of: 06/15/15  2:47 PM.  Always use your most recent med list.               albuterol 108 (90 Base) MCG/ACT inhaler  Commonly known as:  PROVENTIL HFA;VENTOLIN HFA  Inhale 2 puffs into the lungs every 4 (four) hours as  needed for wheezing or shortness of breath.     albuterol (2.5 MG/3ML) 0.083% nebulizer solution  Commonly known as:  PROVENTIL  Take 2.5 mg by nebulization every 4 (four) hours as needed for wheezing or shortness of breath.     AMBULATORY NON FORMULARY MEDICATION  Med Pass: Give 120 ml Twice daily between meals as supplement.     amLODipine 5 MG tablet  Commonly known as:  NORVASC  TAKE 1 TABLET(5 MG) BY MOUTH DAILY     aspirin 81 MG tablet  Take 81 mg by mouth daily.     brimonidine  0.15 % ophthalmic solution  Commonly known as:  ALPHAGAN  Place 1 drop into the left eye 2 (two) times daily.     budesonide-formoterol 160-4.5 MCG/ACT inhaler  Commonly known as:  SYMBICORT  Inhale 2 puffs into the lungs 2 (two) times daily.     donepezil 10 MG tablet  Commonly known as:  ARICEPT  Take 10 mg by mouth at bedtime.     fluticasone 50 MCG/ACT nasal spray  Commonly known as:  FLONASE  Place 2 sprays into both nostrils 2 (two) times daily as needed for allergies.     HYDROcodone-acetaminophen 5-325 MG tablet  Commonly known as:  NORCO/VICODIN  Take one tablet by mouth every 6 hours for 2 days. Do not exceed 4gm of Tylenol in 24 hours     insulin glargine 100 UNIT/ML injection  Commonly known as:  LANTUS  Inject 0.1 mLs (10 Units total) into the skin daily.     LORazepam 0.5 MG tablet  Commonly known as:  ATIVAN  Take 0.5 mg by mouth every 6 (six) hours as needed for anxiety.     losartan 100 MG tablet  Commonly known as:  COZAAR  TAKE 1 TABLET(100 MG) BY MOUTH DAILY     mirtazapine 7.5 MG tablet  Commonly known as:  REMERON  Take 7.5 mg by mouth at bedtime.     multivitamin with minerals Tabs tablet  Take 1 tablet by mouth daily.     omeprazole 20 MG capsule  Commonly known as:  PRILOSEC  Take 20 mg by mouth daily.     PREDNISONE (PAK) PO  Take 40 mg po daily until 06/14/15. Take 30 mg po daily until 06/17/15. Take 20 mg po daily until 06/20/15. Take 10 mg po daily  until 06/23/15, then stop.     simvastatin 20 MG tablet  Commonly known as:  ZOCOR  Take 1 tablet (20 mg total) by mouth daily at 6 PM.     valACYclovir 500 MG tablet  Commonly known as:  VALTREX  Take 500 mg by mouth 2 (two) times daily as needed. For Canker Sores     vitamin B-12 1000 MCG tablet  Commonly known as:  CYANOCOBALAMIN  Take 1,000 mcg by mouth daily.     Vitamin D 1000 units capsule  Take 1,000 Units by mouth daily.        Immunizations: Immunization History  Administered Date(s) Administered  . Influenza Split 12/25/2010, 09/21/2011, 11/24/2012  . Influenza Whole 11/05/2008, 10/28/2009  . Influenza, High Dose Seasonal PF 11/04/2014  . Influenza,inj,Quad PF,36+ Mos 10/11/2013  . PPD Test 06/09/2015  . Pneumococcal Conjugate-13 04/02/2013  . Pneumococcal Polysaccharide-23 11/29/2006, 03/17/2009  . Td 11/18/2002  . Tdap 05/08/2013  . Zoster 03/25/2010     Physical Exam: Filed Vitals:   06/15/15 1432  BP: 136/80  Pulse: 58  Temp: 97.3 F (36.3 C)  TempSrc: Oral  Resp: 18  Height: 5\' 9"  (1.753 m)  Weight: 164 lb (74.39 kg)  SpO2: 96%   Body mass index is 24.21 kg/(m^2).  General- elderly male, well built, in no acute distress Head- normocephalic, atraumatic Nose- no maxillary or frontal sinus tenderness Throat- moist mucus membrane Eyes- PERRLA, EOMI, no pallor, no icterus, no discharge, normal conjunctiva, normal sclera Neck- no cervical lymphadenopathy Cardiovascular- normal s1,s2, no murmur, no leg edema Respiratory- bilateral poor air movement but no wheeze, no rhonchi, no crackles, no use of accessory muscles Abdomen- bowel sounds present, soft, non tender Musculoskeletal- able to  move all 4 extremities, generalized weakness more on left side Neurological- lack of co-ordination, alert and oriented x 3 Skin- warm and dry Psychiatry- normal mood and affect    Labs reviewed: Basic Metabolic Panel:  Recent Labs  03/15/15 0450   06/01/15 1844  06/02/15 0200 06/03/15 0222 06/05/15 0441 06/08/15 06/08/15 0303  NA 142  < > 135  < > 136 142 142 136* 136  K 4.3  < > 5.0  < > 4.3 3.8 3.7  --  4.2  CL 107  < > 101  < > 102 109 105  --  101  CO2 26  < > 17*  --  16* 20* 25  --  26  GLUCOSE 193*  < > 306*  < > 346* 214* 133*  --  188*  BUN 15  < > 22*  < > 25* 24* 22* 34* 34*  CREATININE 1.22  < > 1.70*  < > 1.49* 1.38* 1.48* 1.6* 1.61*  CALCIUM 9.3  < > 10.1  --  8.5* 8.6* 9.1  --  9.7  MG 1.6*  --  3.7*  --  2.0  --   --   --   --   PHOS  --   --  9.0*  --  1.6*  --  4.0  --   --   < > = values in this interval not displayed. Liver Function Tests:  Recent Labs  03/15/15 0450 04/13/15 1521 06/01/15 1844 06/05/15 0441  AST 12* 14 33  --   ALT 11* 14 22  --   ALKPHOS 49 99 88  --   BILITOT 0.3 0.5 0.4  --   PROT 5.6* 7.4 7.3  --   ALBUMIN 2.7* 4.5 4.1 3.0*    Recent Labs  06/01/15 2344  LIPASE 26   No results for input(s): AMMONIA in the last 8760 hours. CBC:  Recent Labs  03/11/15 1010  06/01/15 1844  06/02/15 0200 06/03/15 0222 06/05/15 06/05/15 0441  WBC 16.4*  < > 18.9*  --  13.3* 16.8* 8.8 8.8  NEUTROABS 13.7*  --  6.0  --   --   --   --  5.8  HGB 12.1*  < > 12.9*  < > 10.9* 10.6*  --  10.8*  HCT 39.7  < > 42.3  < > 35.6* 33.8*  --  34.2*  MCV 84.1  < > 89.2  --  84.0 82.6  --  83.8  PLT 200  < > 298  --  190 207  --  199  < > = values in this interval not displayed. Cardiac Enzymes:  Recent Labs  06/02/15 0200 06/02/15 0733 06/02/15 1040  TROPONINI 0.33* 0.67* 0.63*   BNP: Invalid input(s): POCBNP CBG:  Recent Labs  06/08/15 2103 06/09/15 0642 06/09/15 1648  GLUCAP 158* 126* 281*    Radiological Exams: Ct Angio Head W/cm &/or Wo Cm  06/05/2015  CLINICAL DATA:  82 year old male with symptoms of new onset left side weakness following admission for respiratory failure requiring intubation. Found have patchy right MCA and PCA territory infarcts on MRI. Initial encounter.  EXAM: CT ANGIOGRAPHY HEAD AND NECK TECHNIQUE: Multidetector CT imaging of the head and neck was performed using the standard protocol during bolus administration of intravenous contrast. Multiplanar CT image reconstructions and MIPs were obtained to evaluate the vascular anatomy. Carotid stenosis measurements (when applicable) are obtained utilizing NASCET criteria, using the distal internal carotid diameter as the denominator. CONTRAST:  50 mL Isovue 370 COMPARISON:  Brain MRI 06/04/2015. Head CT without contrast 05/15/2014 and earlier. CT Abdomen and Pelvis 05/31/2010 FINDINGS: CT HEAD Brain: No acute intracranial hemorrhage identified. Cortically based infarct evident at the right superior frontal gyrus on series 7, image 25. No intracranial mass effect. There is also increased hypodensity in the right caudate on image 17. Other recently depicted small areas of ischemia remain occult on CT. Stable gray-white matter differentiation elsewhere. No ventriculomegaly. Calvarium and skull base: Stable. No acute osseous abnormality identified. Paranasal sinuses: Right frontal sinus opacification and mucosal thickening is stable. Other visualized sinuses and mastoids are clear. Orbits: Stable, postoperative changes to the globes right greater than left. No acute scalp soft tissue findings. CTA NECK Skeleton: Advanced cervical spine degeneration. There is ankylosis in some of the visualized upper thoracic spine. Incidental spina bifida occulta at T1. No acute osseous abnormality identified. Other neck: Dilated and patulous thoracic esophagus. In 2012 gastric hiatal hernia was demonstrated, but the distal esophagus was not visible. No superior mediastinal lymphadenopathy. Mild anterior upper lobe scarring and paraseptal emphysema greater on the left. Negative thyroid. The glottis is closed. Motion artifact through the hypopharynx and oropharynx. Negative nasopharynx. Negative superior parapharyngeal spaces. Sublingual  space, submandibular glands and parotid glands appear normal. No cervical lymphadenopathy. Aortic arch: 3 vessel arch configuration with mild to moderate calcified arch and great vessel atherosclerosis. Right carotid system: No brachiocephalic artery or right CCA origin stenosis. Mildly tortuous right CCA with occasional calcified plaque. Soft and calcified plaque in the right ICA origin and bulb. Complex atherosclerosis in the distal bulb which appears to be partially ulcerated. See series 5, images 92 and 93. There might be stenosis here up to 65 % with respect to the distal vessel (series 13, image 53). Distal to this level the cervical right ICA is normal. Left carotid system: No left CCA origin stenosis. Mildly tortuous left CCA with occasional calcified plaque proximal to the bifurcation. Bulky calcified plaque at the left ICA origin and bulb, but no stenosis results (series 12, image 119). Patulous bulb compatible with previous left carotid surgery. Negative cervical left ICA distal to the bulb. Vertebral arteries:No proximal right subclavian artery stenosis despite calcified plaque. The right vertebral artery is occluded at its origin with some calcified plaque, series 12, image 125. There is faint reconstituted enhancement in the distal right V2 segment beginning at C3. The vessel has a more normal albeit non dominant appearance at the C1 level and is patent as it crosses the dura. No proximal left subclavian artery stenosis despite soft and calcified plaque. Calcified plaque at the left vertebral artery origin but only mild stenosis results. Dominant left vertebral artery with mild tortuosity in the neck and no other atherosclerosis. CTA HEAD Posterior circulation: Dominant left vertebral artery without stenosis in the posterior fossa. Patent vertebrobasilar junction. Diminutive but patent right V4 segment. Right PICA origin is patent. Left PICA origin is diminutive. No basilar artery stenosis. Normal SCA  and PCA origins. Posterior communicating arteries are diminutive or absent. Bilateral PCA branches are within normal limits. Anterior circulation: Both ICA siphons are patent. Minimal calcified plaque on the left with no stenosis. Normal left ophthalmic artery origin. Mild calcified plaque and dolichoectasia on the right with no stenosis. Normal right ophthalmic artery origin. Patent carotid termini. Normal MCA and ACA origins. Tortuous A1 segments. Normal anterior communicating artery. Bilateral ACA branches are mildly irregular but otherwise normal (series 16, image 18). Left MCA M1 segment, bifurcation, and left  MCA branches are within normal limits. Right MCA M1 segment, bifurcation, and right MCA bifurcation are patent and within normal limits. There seem to be a paucity of posterior most right M3 branches, but no proximal M2 occlusion is identified. Venous sinuses: Patent. Anatomic variants: Dominant appearing left vertebral artery. Delayed phase: No abnormal enhancement identified. IMPRESSION: 1. Negative for emergent large vessel occlusion associated with the acute right cerebral infarcts. There is a paucity of posterior right MCA M3 branches, but no M2 branch occlusion identified. 2. Complex atherosclerotic plaque at the right ICA origin and bulb with some ulceration. Distal bulb level stenosis which might be hemodynamically significant (estimated at up to 65%). Correlation with right side carotid Doppler ultrasound may be valuable. 3. Previous left carotid surgery.  Calcified plaque but no stenosis. 4. 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 5. Dominant appearing left vertebral artery with calcified plaque and mild stenosis at its origin. Otherwise negative posterior circulation. 6. Expected evolution of the larger ischemic foci demonstrated yesterday by MRI. No associated hemorrhage or mass  effect. 7. Patulous thoracic esophagus containing fluid/debris. Consider achalasia. 8. Advanced cervical spine degeneration. Areas of ankylosis in the upper thoracic spine. Electronically Signed   By: Genevie Ann M.D.   On: 06/05/2015 14:59   Dg Chest 2 View  05/25/2015  CLINICAL DATA:  Cough and low-grade fever. Followup pneumonia. Ex-smoker. EXAM: CHEST  2 VIEW COMPARISON:  04/13/2015. FINDINGS: Normal sized heart. Persistent wedge-shaped density in the left lower lobe, unchanged compared to previous examinations dating back to 11/19/2013, corresponding to a previously demonstrated large hiatal hernia. Stable prominent epicardial fat pad. The lungs are mildly hyperexpanded with mild diffuse peribronchial thickening. Upper abdominal surgical clips. The bones appear osteopenic. IMPRESSION: 1. No acute abnormality. 2. Stable large hiatal hernia. 3. Mild changes of COPD and chronic bronchitis. Electronically Signed   By: Claudie Revering M.D.   On: 05/25/2015 14:14   Ct Angio Neck W/cm &/or Wo/cm  06/05/2015  CLINICAL DATA:  82 year old male with symptoms of new onset left side weakness following admission for respiratory failure requiring intubation. Found have patchy right MCA and PCA territory infarcts on MRI. Initial encounter. EXAM: CT ANGIOGRAPHY HEAD AND NECK TECHNIQUE: Multidetector CT imaging of the head and neck was performed using the standard protocol during bolus administration of intravenous contrast. Multiplanar CT image reconstructions and MIPs were obtained to evaluate the vascular anatomy. Carotid stenosis measurements (when applicable) are obtained utilizing NASCET criteria, using the distal internal carotid diameter as the denominator. CONTRAST:  50 mL Isovue 370 COMPARISON:  Brain MRI 06/04/2015. Head CT without contrast 05/15/2014 and earlier. CT Abdomen and Pelvis 05/31/2010 FINDINGS: CT HEAD Brain: No acute intracranial hemorrhage identified. Cortically based infarct evident at the right superior  frontal gyrus on series 7, image 25. No intracranial mass effect. There is also increased hypodensity in the right caudate on image 17. Other recently depicted small areas of ischemia remain occult on CT. Stable gray-white matter differentiation elsewhere. No ventriculomegaly. Calvarium and skull base: Stable. No acute osseous abnormality identified. Paranasal sinuses: Right frontal sinus opacification and mucosal thickening is stable. Other visualized sinuses and mastoids are clear. Orbits: Stable, postoperative changes to the globes right greater than left. No acute scalp soft tissue findings. CTA NECK Skeleton: Advanced cervical spine degeneration. There is ankylosis in some of the visualized upper thoracic spine. Incidental spina bifida occulta at T1. No acute osseous  abnormality identified. Other neck: Dilated and patulous thoracic esophagus. In 2012 gastric hiatal hernia was demonstrated, but the distal esophagus was not visible. No superior mediastinal lymphadenopathy. Mild anterior upper lobe scarring and paraseptal emphysema greater on the left. Negative thyroid. The glottis is closed. Motion artifact through the hypopharynx and oropharynx. Negative nasopharynx. Negative superior parapharyngeal spaces. Sublingual space, submandibular glands and parotid glands appear normal. No cervical lymphadenopathy. Aortic arch: 3 vessel arch configuration with mild to moderate calcified arch and great vessel atherosclerosis. Right carotid system: No brachiocephalic artery or right CCA origin stenosis. Mildly tortuous right CCA with occasional calcified plaque. Soft and calcified plaque in the right ICA origin and bulb. Complex atherosclerosis in the distal bulb which appears to be partially ulcerated. See series 5, images 92 and 93. There might be stenosis here up to 65 % with respect to the distal vessel (series 13, image 53). Distal to this level the cervical right ICA is normal. Left carotid system: No left CCA  origin stenosis. Mildly tortuous left CCA with occasional calcified plaque proximal to the bifurcation. Bulky calcified plaque at the left ICA origin and bulb, but no stenosis results (series 12, image 119). Patulous bulb compatible with previous left carotid surgery. Negative cervical left ICA distal to the bulb. Vertebral arteries:No proximal right subclavian artery stenosis despite calcified plaque. The right vertebral artery is occluded at its origin with some calcified plaque, series 12, image 125. There is faint reconstituted enhancement in the distal right V2 segment beginning at C3. The vessel has a more normal albeit non dominant appearance at the C1 level and is patent as it crosses the dura. No proximal left subclavian artery stenosis despite soft and calcified plaque. Calcified plaque at the left vertebral artery origin but only mild stenosis results. Dominant left vertebral artery with mild tortuosity in the neck and no other atherosclerosis. CTA HEAD Posterior circulation: Dominant left vertebral artery without stenosis in the posterior fossa. Patent vertebrobasilar junction. Diminutive but patent right V4 segment. Right PICA origin is patent. Left PICA origin is diminutive. No basilar artery stenosis. Normal SCA and PCA origins. Posterior communicating arteries are diminutive or absent. Bilateral PCA branches are within normal limits. Anterior circulation: Both ICA siphons are patent. Minimal calcified plaque on the left with no stenosis. Normal left ophthalmic artery origin. Mild calcified plaque and dolichoectasia on the right with no stenosis. Normal right ophthalmic artery origin. Patent carotid termini. Normal MCA and ACA origins. Tortuous A1 segments. Normal anterior communicating artery. Bilateral ACA branches are mildly irregular but otherwise normal (series 16, image 18). Left MCA M1 segment, bifurcation, and left MCA branches are within normal limits. Right MCA M1 segment, bifurcation, and  right MCA bifurcation are patent and within normal limits. There seem to be a paucity of posterior most right M3 branches, but no proximal M2 occlusion is identified. Venous sinuses: Patent. Anatomic variants: Dominant appearing left vertebral artery. Delayed phase: No abnormal enhancement identified. IMPRESSION: 1. Negative for emergent large vessel occlusion associated with the acute right cerebral infarcts. There is a paucity of posterior right MCA M3 branches, but no M2 branch occlusion identified. 2. Complex atherosclerotic plaque at the right ICA origin and bulb with some ulceration. Distal bulb level stenosis which might be hemodynamically significant (estimated at up to 65%). Correlation with right side carotid Doppler ultrasound may be valuable. 3. Previous left carotid surgery.  Calcified plaque but no stenosis. 4. Right vertebral artery is occluded at its origin, with reconstitution distally which may be  in a retrograde fashion from the vertebrobasilar junction. 5. Dominant appearing left vertebral artery with calcified plaque and mild stenosis at its origin. Otherwise negative posterior circulation. 6. Expected evolution of the larger ischemic foci demonstrated yesterday by MRI. No associated hemorrhage or mass effect. 7. Patulous thoracic esophagus containing fluid/debris. Consider achalasia. 8. Advanced cervical spine degeneration. Areas of ankylosis in the upper thoracic spine. Electronically Signed   By: Genevie Ann M.D.   On: 06/05/2015 14:59   Mr Brain Wo Contrast  06/04/2015  CLINICAL DATA:  Stroke.  Hypertension diabetes COPD EXAM: MRI HEAD WITHOUT CONTRAST TECHNIQUE: Multiplanar, multiecho pulse sequences of the brain and surrounding structures were obtained without intravenous contrast. COMPARISON:  None. FINDINGS: Acute infarct in the right corona radiata white matter. Acute infarct extends into the right parietal cortex and right posterior temporal lobe. Acute infarct in the right occipital  lobe . Small area of acute infarct in the left parietal periventricular white matter. Moderate to advanced atrophy. Mild chronic microvascular ischemic change in the white matter. Brainstem and cerebellum intact. Negative for intracranial hemorrhage. Negative for mass or edema.  No shift of the midline structures. Paranasal sinuses clear. Normal orbit. Pituitary normal in size. Skullbase intact. IMPRESSION: Acute infarcts in the right parietal lobe. Small areas of acute infarct in the right occipital lobe, right posterior temporal lobe, and left parietal white matter. Advanced atrophy. Electronically Signed   By: Franchot Gallo M.D.   On: 06/04/2015 19:52   Dg Chest Port 1 View  06/03/2015  CLINICAL DATA:  Respiratory failure with shortness of breath EXAM: PORTABLE CHEST 1 VIEW COMPARISON:  Yesterday FINDINGS: Tracheal and esophageal extubation. On the first image there is lower lung volumes with streaky opacity, but transient based on subsequent x-ray. There is suggestion of a hiatal hernia based on nasogastric tube positioning yesterday. IMPRESSION: 1. Stable aeration after extubation. 2. Persistent dense opacity behind the heart that correlates with history of pneumonia. Based on nasogastric tube positioning yesterday there could also be a hiatal hernia or dilated esophagus. Electronically Signed   By: Monte Fantasia M.D.   On: 06/03/2015 07:24   Dg Chest Port 1 View  06/02/2015  CLINICAL DATA:  Respiratory failure, COPD, healthcare associated pneumonia, intubated patient. EXAM: PORTABLE CHEST 1 VIEW COMPARISON:  Portable chest x-ray of Jun 01, 2015 FINDINGS: The lungs are adequately inflated. Persistent retrocardiac density on the left is demonstrated. Infrahilar increased density on the right has developed. The heart is normal in size. The pulmonary vascularity is not engorged. The endotracheal tube tip lies 2.0 cm above the carina. The esophagogastric tube tip does not lie below the hemidiaphragm though  may lie in a hiatal hernia if any. IMPRESSION: Increasing infrahilar density on the right. Persistent left lower lobe atelectasis or pneumonia. Low positioning of the endotracheal tube. Withdrawal by 3-5 cm is recommended. Inadequate positioning of the NG tube. Advancement by approximately 10 cm is recommended. Electronically Signed   By: David  Martinique M.D.   On: 06/02/2015 07:15   Dg Chest Port 1 View  06/01/2015  CLINICAL DATA:  Shortness of breath for 2 days.  Post intubation. EXAM: PORTABLE CHEST 1 VIEW COMPARISON:  None. FINDINGS: Endotracheal tube is approximately 6.1 cm above the carina. The tip of the nasogastric tube is in the upper abdomen and probably near the GE junction. Densities at the medial left lung base are suggestive for consolidation in the retrocardiac space. Aspiration cannot be excluded. Upper lungs appear to be clear. Heart size  is normal. Negative for pneumothorax. IMPRESSION: Left basilar densities are suggestive for consolidation or airspace disease. Findings could represent pneumonia, atelectasis or aspiration. Endotracheal tube is appropriately positioned. Nasogastric tube is near the GE junction. Electronically Signed   By: Markus Daft M.D.   On: 06/01/2015 19:04   Left Finger(s) 2 Views 06/13/15 Impression. Soft tissue swelling. Arthritic joints.    Assessment/Plan  Physical deconditioning Will have him work with physical therapy and occupational therapy team to help with gait training and muscle strengthening exercises.fall precautions. Skin care. Encourage to be out of bed.   COPD exacerbation complete prednisone taper. Continue bronchodilator treatment. Add incentive spirometer and monitor for signs of exacerbation  Acute CVA Will have patient work with PT/OT as tolerated to regain strength and restore function.  Fall precautions are in place. Continue baby aspirin and statin. Continue SLP follow up. Has f/u with neurology  Hypertension continue norvasc 5 mg  daily, cozaar 100 mg daily and monitor bp  Left forefinger soft tissue swelling Post trauma from fall. Reviewed xray report. Fracture has been ruled out. D/c vicodin given his dementia and high fall risk. Start tylenol 650 mg q6h prn pain. Ice pack over swelling as needed  Depression continue remeron 7.5 mg daily and get psych consult  HLD Continue zocor 20 mg daily  Dementia continue aricept 10 mg daily. Continue supportive care. Continue b12 supplement  gerd Stable, continue prilosec for now  Diabetes continue lantus 10 units daily and monitor cbg Lab Results  Component Value Date   HGBA1C 8.3* 06/05/2015   Anxiety Continue ativan 0.5 mg q6h prn for now and monitor  Anemia of chronic disease Monitor cbc   Goals of care: short term rehabilitation   Labs/tests ordered: cbc, cmp  Family/ staff Communication: reviewed care plan with patient, his wife and nursing supervisor    Blanchie Serve, MD Internal Medicine [address] [place], [place] [address] Cell Phone (Monday-Friday 8 am - 5 pm): [phone] On Call: [phone] and follow prompts after 5 pm and on weekends Office Phone: [phone] Office Fax: [phone]

## 2015-06-15 NOTE — Telephone Encounter (Signed)
[facility] 

## 2015-06-16 ENCOUNTER — Encounter: Payer: Self-pay | Admitting: Family

## 2015-06-16 ENCOUNTER — Non-Acute Institutional Stay (SKILLED_NURSING_FACILITY): Payer: Medicare HMO | Admitting: Family

## 2015-06-16 DIAGNOSIS — R269 Unspecified abnormalities of gait and mobility: Secondary | ICD-10-CM

## 2015-06-16 DIAGNOSIS — K219 Gastro-esophageal reflux disease without esophagitis: Secondary | ICD-10-CM | POA: Diagnosis not present

## 2015-06-16 DIAGNOSIS — I6330 Cerebral infarction due to thrombosis of unspecified cerebral artery: Secondary | ICD-10-CM | POA: Diagnosis not present

## 2015-06-16 DIAGNOSIS — K222 Esophageal obstruction: Secondary | ICD-10-CM

## 2015-06-16 DIAGNOSIS — E1122 Type 2 diabetes mellitus with diabetic chronic kidney disease: Secondary | ICD-10-CM

## 2015-06-16 DIAGNOSIS — J441 Chronic obstructive pulmonary disease with (acute) exacerbation: Secondary | ICD-10-CM

## 2015-06-16 DIAGNOSIS — F0391 Unspecified dementia with behavioral disturbance: Secondary | ICD-10-CM | POA: Diagnosis not present

## 2015-06-16 DIAGNOSIS — N182 Chronic kidney disease, stage 2 (mild): Secondary | ICD-10-CM

## 2015-06-16 DIAGNOSIS — I10 Essential (primary) hypertension: Secondary | ICD-10-CM | POA: Diagnosis not present

## 2015-06-16 DIAGNOSIS — F03B18 Unspecified dementia, moderate, with other behavioral disturbance: Secondary | ICD-10-CM

## 2015-06-16 LAB — BASIC METABOLIC PANEL
BUN: 20 mg/dL (ref 4–21)
Creatinine: 1.1 mg/dL (ref 0.6–1.3)
GLUCOSE: 147 mg/dL
Potassium: 4.1 mmol/L (ref 3.4–5.3)
SODIUM: 142 mmol/L (ref 137–147)

## 2015-06-16 LAB — HEPATIC FUNCTION PANEL
ALT: 14 U/L (ref 10–40)
AST: 10 U/L — AB (ref 14–40)
Alkaline Phosphatase: 65 U/L (ref 25–125)
BILIRUBIN, TOTAL: 0.4 mg/dL

## 2015-06-16 LAB — CBC AND DIFFERENTIAL
HEMATOCRIT: 36 % — AB (ref 41–53)
HEMOGLOBIN: 11.1 g/dL — AB (ref 13.5–17.5)
Platelets: 188 10*3/uL (ref 150–399)
WBC: 14.8 10*3/mL

## 2015-06-16 NOTE — Progress Notes (Signed)
Patient ID: Chad [name], male   DOB: May 12, 1929, 82 y.o.   MRN: [id number]  Location:  [facility] Room Number: 1206 Place of Service:  SNF (31) Provider: Kenston Longton FNP-C  Blanchie Serve, MD    Hoyt Koch, MD  Patient Care Team: Hoyt Koch, MD as PCP - General (Internal Medicine) Elsie Stain, MD (Pulmonary Disease) Hoyt Koch, MD (Internal Medicine)  Extended Emergency Contact Information Primary Emergency Contact: Grumbine,Patsy Address: [place], [place] Phone: [phone] Relation: Spouse Secondary Emergency Contact: Passow,Patsy Address: [address]          [place], [place] [address] Johnnette [place] of [place] Phone: [phone] Relation: Spouse  Code Status: Full Code  Goals of care: Advanced Directive information Advanced Directives 06/16/2015  Does patient have an advance directive? No  Type of Advance Directive -  Would patient like information on creating an advanced directive? -     Chief Complaint  Patient presents with  . Discharge Note    HPI:  Pt is a 82 y.o. male seen today at [facility]  for an acute visit for discharge home. He was here for short term rehabilitation post hospital admission 06/01/2015-06/09/2015 for Respiratory failure due to COPD exacerbation. He required intubation, steroids and antibiotics. Large copious amounts of food like secretions was suctioned from ET tube concerning for aspiration. He then had acute CVA with left sided weakness and was seen by neurology team. He has a significant medical history of HTN, Type 2 DM, CAD, Diaphragmatic hernia, fatty liver disease among others. He is seen in his room today. He states ready to go home. He has worked with PT/OT. He lucks coordination not safe for discharge but patient and wife insist of discharge home. Patient's wife willing to provide care at home.  He will be discharge with Home health PT/OT to continue with ROM, exercise, gait stability and muscle strengthening. He will also require Speech Therapist due to high risk aspiration recent CVA.  He will require standard WC 18 X 18 with cushion, elevating leg rests, extended handbrakes and anti tippers. Home health services will be arranged by facility Social worker prior to discharge home. Prescription medication will be written x 1 month supply then patient to follow up with PCP in 1-2 weeks. Patient's wife reported patient cough and congestion seems worsen today.         Past Medical History  Diagnosis Date  . Fatty liver disease, nonalcoholic   . Coronary artery stenosis   . Diverticulosis of colon   . Diaphragmatic hernia without mention of obstruction or gangrene   . Dyspnea   . Personal history of other malignant neoplasm of skin   . Barrett's esophagus   . TIA (transient ischemic attack) 1998 X "a few"  . Hyperlipidemia   . Acid reflux disease   . Glaucoma   . HTN (hypertension)   . RENAL CALCULUS, HX OF 08/28/2008  . CAROTID ARTERY STENOSIS, BILATERAL 08/27/2008  . Esophageal stenosis   . COPD (chronic obstructive pulmonary disease) ([place])     "severe" (12/20/2013)  . Chronic bronchitis ([place])     "most q yr; he's had it several times" (12/20/2013)  . Pneumonia 1990's X 4  . Type II diabetes mellitus ([place])   . History of blood transfusion 2007    "related to OR"  .  History of hiatal hernia     "repaired when esophagus removed"  . History of stomach ulcers   . Sinus headache     "seasonal"  . Arthritis     "all over"  . Kidney stones     "passed them all" (12/20/2013)  . Basal cell carcinoma of nose   . Diabetes mellitus without complication ([place])   . COPD (chronic obstructive pulmonary disease) ([place])   . Stroke ([place])   . Hypertension   . Glaucoma   . Diverticulitis   . GERD (gastroesophageal reflux disease)   . Arthritis   . High cholesterol   . Renal disorder     Past Surgical History  Procedure Laterality Date  . Esophagectomy  2007    with stomach pull through  . Pars plana vitrectomy Bilateral 01/2007-05/2007  . Ventral hernia repair  02/2006  . Knee arthroscopy Bilateral 04/1989; 08/1996; 06/1999    "right; left; left"  . Carotid endarterectomy Bilateral 02/1996-03/1996  . Shoulder arthroscopy Right 04/1998  . Mohs surgery  ?2002    "tip of nose; basal cell"  . Nasal polyp excision  2005    "benign"  . Hernia repair    . Cataract extraction w/ intraocular lens implant Bilateral 04/2007 - 09/2007    "left-right"  . Glaucoma surgery Right 11/2008    "implanted drain tube"  . Esophagogastroduodenoscopy (egd) with esophageal dilation  10/2011  . Eye surgery Right 04/2006; 08/2007    "had gas bubbles put in"  . Eye surgery Right 01/2007    "air bubble"  . Eye surgery Left 12/12/2013    "cleaned his implant w/laser"  . Knee surgery    . Carotid artery angioplasty    . Shoulder surgery    . Basal cell carcinoma excision    . Eye surgery      Allergies  Allergen Reactions  . Avelox [Moxifloxacin Hcl In Nacl] Anaphylaxis  . Moxifloxacin Anaphylaxis    Avelox REACTION: anaphylaxis  . Quinolones Anaphylaxis  . Sulfonamide Derivatives     REACTION: hives  . Timolol Maleate     Causes severe chest congestion  . Timoptic [Timolol Maleate] Other (See Comments)    Congestion  . Sulfa Antibiotics Rash      Medication List       This list is accurate as of: 06/16/15  6:05 PM.  Always use your most recent med list.               acetaminophen 650 MG CR tablet  Commonly known as:  TYLENOL  Take 650 mg by mouth every 6 (six) hours as needed for pain.     albuterol 108 (90 Base) MCG/ACT inhaler  Commonly known as:  PROVENTIL HFA;VENTOLIN HFA  Inhale 2 puffs into the lungs every 4 (four) hours as needed for wheezing or shortness of breath.     albuterol (2.5 MG/3ML) 0.083% nebulizer solution  Commonly known as:  PROVENTIL  Take 2.5 mg by  nebulization every 4 (four) hours as needed for wheezing or shortness of breath.     AMBULATORY NON FORMULARY MEDICATION  Med Pass: Give 120 ml Twice daily between meals as supplement.     amLODipine 5 MG tablet  Commonly known as:  NORVASC  TAKE 1 TABLET(5 MG) BY MOUTH DAILY     aspirin 81 MG tablet  Take 81 mg by mouth daily.     brimonidine 0.15 % ophthalmic solution  Commonly known as:  ALPHAGAN  Place 1 drop  into the left eye 2 (two) times daily.     budesonide-formoterol 160-4.5 MCG/ACT inhaler  Commonly known as:  SYMBICORT  Inhale 2 puffs into the lungs 2 (two) times daily.     donepezil 10 MG tablet  Commonly known as:  ARICEPT  Take 10 mg by mouth at bedtime.     fluticasone 50 MCG/ACT nasal spray  Commonly known as:  FLONASE  Place 2 sprays into both nostrils 2 (two) times daily as needed for allergies.     insulin glargine 100 UNIT/ML injection  Commonly known as:  LANTUS  Inject 0.1 mLs (10 Units total) into the skin daily.     LORazepam 0.5 MG tablet  Commonly known as:  ATIVAN  Take 0.5 mg by mouth every 6 (six) hours as needed for anxiety.     losartan 100 MG tablet  Commonly known as:  COZAAR  TAKE 1 TABLET(100 MG) BY MOUTH DAILY     mirtazapine 7.5 MG tablet  Commonly known as:  REMERON  Take 7.5 mg by mouth at bedtime.     multivitamin with minerals Tabs tablet  Take 1 tablet by mouth daily.     omeprazole 20 MG capsule  Commonly known as:  PRILOSEC  Take 20 mg by mouth daily.     PREDNISONE (PAK) PO  Take 40 mg po daily until 06/14/15. Take 30 mg po daily until 06/17/15. Take 20 mg po daily until 06/20/15. Take 10 mg po daily until 06/23/15, then stop.     simvastatin 20 MG tablet  Commonly known as:  ZOCOR  Take 1 tablet (20 mg total) by mouth daily at 6 PM.     valACYclovir 500 MG tablet  Commonly known as:  VALTREX  Take 500 mg by mouth 2 (two) times daily as needed. For Canker Sores     vitamin B-12 1000 MCG tablet  Commonly known as:   CYANOCOBALAMIN  Take 1,000 mcg by mouth daily.     Vitamin D 1000 units capsule  Take 1,000 Units by mouth daily.        Review of Systems  Constitutional: Negative for fever, chills, activity change, appetite change and fatigue.  HENT: Negative for congestion, rhinorrhea, sinus pressure, sneezing and sore throat.   Eyes: Negative.   Respiratory: Positive for cough. Negative for chest tightness, shortness of breath and wheezing.   Gastrointestinal: Negative for nausea, vomiting, abdominal pain, diarrhea and constipation.       Hernia. Uses Abdominal Binder   Endocrine: Negative.   Genitourinary: Negative for dysuria, urgency, frequency and flank pain.  Musculoskeletal: Positive for gait problem.  Skin: Negative.   Neurological: Negative for dizziness, seizures, light-headedness and headaches.  Hematological: Does not bruise/bleed easily.  Psychiatric/Behavioral: Negative for hallucinations, confusion, sleep disturbance and agitation. The patient is not nervous/anxious.     Immunization History  Administered Date(s) Administered  . Influenza Split 12/25/2010, 09/21/2011, 11/24/2012  . Influenza Whole 11/05/2008, 10/28/2009  . Influenza, High Dose Seasonal PF 11/04/2014  . Influenza,inj,Quad PF,36+ Mos 10/11/2013  . PPD Test 06/09/2015  . Pneumococcal Conjugate-13 04/02/2013  . Pneumococcal Polysaccharide-23 11/29/2006, 03/17/2009  . Td 11/18/2002  . Tdap 05/08/2013  . Zoster 03/25/2010   Pertinent  Health Maintenance Due  Topic Date Due  . FOOT EXAM  11/19/1941  . OPHTHALMOLOGY EXAM  01/08/2015  . INFLUENZA VACCINE  08/25/2015  . HEMOGLOBIN A1C  12/06/2015  . PNA vac Low Risk Adult  Completed   Fall Risk  12/24/2014  Falls  in the past year? No   Functional Status Survey:    Filed Vitals:   06/16/15 0932  BP: 136/58  Pulse: 58  Temp: 97.3 F (36.3 C)  TempSrc: Oral  Resp: 18  Height: 5\' 9"  (1.753 m)  Weight: 164 lb (74.39 kg)  SpO2: 96%   Body mass index  is 24.21 kg/(m^2). Physical Exam  Constitutional: He appears well-developed and well-nourished. No distress.  HENT:  Head: Normocephalic.  Mouth/Throat: Oropharynx is clear and moist. No oropharyngeal exudate.  Eyes: Conjunctivae and EOM are normal. Pupils are equal, round, and reactive to light. Right eye exhibits no discharge. Left eye exhibits no discharge. No scleral icterus.  Neck: Normal range of motion. No JVD present. No thyromegaly present.  Cardiovascular: Normal rate, regular rhythm, normal heart sounds and intact distal pulses.  Exam reveals no gallop and no friction rub.   No murmur heard. Pulmonary/Chest: Effort normal. No respiratory distress. He has no wheezes. He has no rales.  Diminished breath sounds to bases   Abdominal: Soft. Bowel sounds are normal. There is no tenderness. There is no rebound and no guarding.  Large Abdominal Hernia. Abdominal Binder in place.   Musculoskeletal: He exhibits no edema or tenderness.  Normal ROM. Left side weakness.   Lymphadenopathy:    He has no cervical adenopathy.  Neurological: He is alert.  Skin: Skin is warm and dry. No rash noted. No erythema. No pallor.  Psychiatric: He has a normal mood and affect.    Labs reviewed:  Recent Labs  03/15/15 0450  06/01/15 1844  06/02/15 0200 06/03/15 0222 06/05/15 0441 06/08/15 06/08/15 0303  NA 142  < > 135  < > 136 142 142 136* 136  K 4.3  < > 5.0  < > 4.3 3.8 3.7  --  4.2  CL 107  < > 101  < > 102 109 105  --  101  CO2 26  < > 17*  --  16* 20* 25  --  26  GLUCOSE 193*  < > 306*  < > 346* 214* 133*  --  188*  BUN 15  < > 22*  < > 25* 24* 22* 34* 34*  CREATININE 1.22  < > 1.70*  < > 1.49* 1.38* 1.48* 1.6* 1.61*  CALCIUM 9.3  < > 10.1  --  8.5* 8.6* 9.1  --  9.7  MG 1.6*  --  3.7*  --  2.0  --   --   --   --   PHOS  --   --  9.0*  --  1.6*  --  4.0  --   --   < > = values in this interval not displayed.  Recent Labs  03/15/15 0450 04/13/15 1521 06/01/15 1844 06/05/15 0441    AST 12* 14 33  --   ALT 11* 14 22  --   ALKPHOS 49 99 88  --   BILITOT 0.3 0.5 0.4  --   PROT 5.6* 7.4 7.3  --   ALBUMIN 2.7* 4.5 4.1 3.0*    Recent Labs  03/11/15 1010  06/01/15 1844  06/02/15 0200 06/03/15 0222 06/05/15 06/05/15 0441  WBC 16.4*  < > 18.9*  --  13.3* 16.8* 8.8 8.8  NEUTROABS 13.7*  --  6.0  --   --   --   --  5.8  HGB 12.1*  < > 12.9*  < > 10.9* 10.6*  --  10.8*  HCT 39.7  < >  42.3  < > 35.6* 33.8*  --  34.2*  MCV 84.1  < > 89.2  --  84.0 82.6  --  83.8  PLT 200  < > 298  --  190 207  --  199  < > = values in this interval not displayed. Lab Results  Component Value Date   TSH 1.326 06/05/2015   Lab Results  Component Value Date   HGBA1C 8.3* 06/05/2015   Lab Results  Component Value Date   CHOL 162 06/05/2015   HDL 46 06/05/2015   LDLCALC 95 06/05/2015   LDLDIRECT 106.0 02/18/2014   TRIG 106 06/05/2015   CHOLHDL 3.5 06/05/2015    Significant Diagnostic Results in last 30 days:  Ct Angio Head W/cm &/or Wo Cm  06/05/2015  CLINICAL DATA:  82 year old male with symptoms of new onset left side weakness following admission for respiratory failure requiring intubation. Found have patchy right MCA and PCA territory infarcts on MRI. Initial encounter. EXAM: CT ANGIOGRAPHY HEAD AND NECK TECHNIQUE: Multidetector CT imaging of the head and neck was performed using the standard protocol during bolus administration of intravenous contrast. Multiplanar CT image reconstructions and MIPs were obtained to evaluate the vascular anatomy. Carotid stenosis measurements (when applicable) are obtained utilizing NASCET criteria, using the distal internal carotid diameter as the denominator. CONTRAST:  50 mL Isovue 370 COMPARISON:  Brain MRI 06/04/2015. Head CT without contrast 05/15/2014 and earlier. CT Abdomen and Pelvis 05/31/2010 FINDINGS: CT HEAD Brain: No acute intracranial hemorrhage identified. Cortically based infarct evident at the right superior frontal gyrus on series  7, image 25. No intracranial mass effect. There is also increased hypodensity in the right caudate on image 17. Other recently depicted small areas of ischemia remain occult on CT. Stable gray-white matter differentiation elsewhere. No ventriculomegaly. Calvarium and skull base: Stable. No acute osseous abnormality identified. Paranasal sinuses: Right frontal sinus opacification and mucosal thickening is stable. Other visualized sinuses and mastoids are clear. Orbits: Stable, postoperative changes to the globes right greater than left. No acute scalp soft tissue findings. CTA NECK Skeleton: Advanced cervical spine degeneration. There is ankylosis in some of the visualized upper thoracic spine. Incidental spina bifida occulta at T1. No acute osseous abnormality identified. Other neck: Dilated and patulous thoracic esophagus. In 2012 gastric hiatal hernia was demonstrated, but the distal esophagus was not visible. No superior mediastinal lymphadenopathy. Mild anterior upper lobe scarring and paraseptal emphysema greater on the left. Negative thyroid. The glottis is closed. Motion artifact through the hypopharynx and oropharynx. Negative nasopharynx. Negative superior parapharyngeal spaces. Sublingual space, submandibular glands and parotid glands appear normal. No cervical lymphadenopathy. Aortic arch: 3 vessel arch configuration with mild to moderate calcified arch and great vessel atherosclerosis. Right carotid system: No brachiocephalic artery or right CCA origin stenosis. Mildly tortuous right CCA with occasional calcified plaque. Soft and calcified plaque in the right ICA origin and bulb. Complex atherosclerosis in the distal bulb which appears to be partially ulcerated. See series 5, images 92 and 93. There might be stenosis here up to 65 % with respect to the distal vessel (series 13, image 53). Distal to this level the cervical right ICA is normal. Left carotid system: No left CCA origin stenosis. Mildly  tortuous left CCA with occasional calcified plaque proximal to the bifurcation. Bulky calcified plaque at the left ICA origin and bulb, but no stenosis results (series 12, image 119). Patulous bulb compatible with previous left carotid surgery. Negative cervical left ICA distal to the bulb.  Vertebral arteries:No proximal right subclavian artery stenosis despite calcified plaque. The right vertebral artery is occluded at its origin with some calcified plaque, series 12, image 125. There is faint reconstituted enhancement in the distal right V2 segment beginning at C3. The vessel has a more normal albeit non dominant appearance at the C1 level and is patent as it crosses the dura. No proximal left subclavian artery stenosis despite soft and calcified plaque. Calcified plaque at the left vertebral artery origin but only mild stenosis results. Dominant left vertebral artery with mild tortuosity in the neck and no other atherosclerosis. CTA HEAD Posterior circulation: Dominant left vertebral artery without stenosis in the posterior fossa. Patent vertebrobasilar junction. Diminutive but patent right V4 segment. Right PICA origin is patent. Left PICA origin is diminutive. No basilar artery stenosis. Normal SCA and PCA origins. Posterior communicating arteries are diminutive or absent. Bilateral PCA branches are within normal limits. Anterior circulation: Both ICA siphons are patent. Minimal calcified plaque on the left with no stenosis. Normal left ophthalmic artery origin. Mild calcified plaque and dolichoectasia on the right with no stenosis. Normal right ophthalmic artery origin. Patent carotid termini. Normal MCA and ACA origins. Tortuous A1 segments. Normal anterior communicating artery. Bilateral ACA branches are mildly irregular but otherwise normal (series 16, image 18). Left MCA M1 segment, bifurcation, and left MCA branches are within normal limits. Right MCA M1 segment, bifurcation, and right MCA bifurcation are  patent and within normal limits. There seem to be a paucity of posterior most right M3 branches, but no proximal M2 occlusion is identified. Venous sinuses: Patent. Anatomic variants: Dominant appearing left vertebral artery. Delayed phase: No abnormal enhancement identified. IMPRESSION: 1. Negative for emergent large vessel occlusion associated with the acute right cerebral infarcts. There is a paucity of posterior right MCA M3 branches, but no M2 branch occlusion identified. 2. Complex atherosclerotic plaque at the right ICA origin and bulb with some ulceration. Distal bulb level stenosis which might be hemodynamically significant (estimated at up to 65%). Correlation with right side carotid Doppler ultrasound may be valuable. 3. Previous left carotid surgery.  Calcified plaque but no stenosis. 4. 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 5. Dominant appearing left vertebral artery with calcified plaque and mild stenosis at its origin. Otherwise negative posterior circulation. 6. Expected evolution of the larger ischemic foci demonstrated yesterday by MRI. No associated hemorrhage or mass effect. 7. Patulous thoracic esophagus containing fluid/debris. Consider achalasia. 8. Advanced cervical spine degeneration. Areas of ankylosis in the upper thoracic spine. Electronically Signed   By: Genevie Ann M.D.   On: 06/05/2015 14:59   Dg Chest 2 View  05/25/2015  CLINICAL DATA:  Cough and low-grade fever. Followup pneumonia. Ex-smoker. EXAM: CHEST  2 VIEW COMPARISON:  04/13/2015. FINDINGS: Normal sized heart. Persistent wedge-shaped density in the left lower lobe, unchanged compared to previous examinations dating back to 11/19/2013, corresponding to a previously demonstrated large hiatal hernia. Stable prominent epicardial fat pad. The lungs are mildly hyperexpanded with mild diffuse peribronchial thickening. Upper  abdominal surgical clips. The bones appear osteopenic. IMPRESSION: 1. No acute abnormality. 2. Stable large hiatal hernia. 3. Mild changes of COPD and chronic bronchitis. Electronically Signed   By: Claudie Revering M.D.   On: 05/25/2015 14:14   Ct Angio Neck W/cm &/or Wo/cm  06/05/2015  CLINICAL DATA:  82 year old male with symptoms of new onset left side weakness following admission for  respiratory failure requiring intubation. Found have patchy right MCA and PCA territory infarcts on MRI. Initial encounter. EXAM: CT ANGIOGRAPHY HEAD AND NECK TECHNIQUE: Multidetector CT imaging of the head and neck was performed using the standard protocol during bolus administration of intravenous contrast. Multiplanar CT image reconstructions and MIPs were obtained to evaluate the vascular anatomy. Carotid stenosis measurements (when applicable) are obtained utilizing NASCET criteria, using the distal internal carotid diameter as the denominator. CONTRAST:  50 mL Isovue 370 COMPARISON:  Brain MRI 06/04/2015. Head CT without contrast 05/15/2014 and earlier. CT Abdomen and Pelvis 05/31/2010 FINDINGS: CT HEAD Brain: No acute intracranial hemorrhage identified. Cortically based infarct evident at the right superior frontal gyrus on series 7, image 25. No intracranial mass effect. There is also increased hypodensity in the right caudate on image 17. Other recently depicted small areas of ischemia remain occult on CT. Stable gray-white matter differentiation elsewhere. No ventriculomegaly. Calvarium and skull base: Stable. No acute osseous abnormality identified. Paranasal sinuses: Right frontal sinus opacification and mucosal thickening is stable. Other visualized sinuses and mastoids are clear. Orbits: Stable, postoperative changes to the globes right greater than left. No acute scalp soft tissue findings. CTA NECK Skeleton: Advanced cervical spine degeneration. There is ankylosis in some of the visualized upper thoracic spine.  Incidental spina bifida occulta at T1. No acute osseous abnormality identified. Other neck: Dilated and patulous thoracic esophagus. In 2012 gastric hiatal hernia was demonstrated, but the distal esophagus was not visible. No superior mediastinal lymphadenopathy. Mild anterior upper lobe scarring and paraseptal emphysema greater on the left. Negative thyroid. The glottis is closed. Motion artifact through the hypopharynx and oropharynx. Negative nasopharynx. Negative superior parapharyngeal spaces. Sublingual space, submandibular glands and parotid glands appear normal. No cervical lymphadenopathy. Aortic arch: 3 vessel arch configuration with mild to moderate calcified arch and great vessel atherosclerosis. Right carotid system: No brachiocephalic artery or right CCA origin stenosis. Mildly tortuous right CCA with occasional calcified plaque. Soft and calcified plaque in the right ICA origin and bulb. Complex atherosclerosis in the distal bulb which appears to be partially ulcerated. See series 5, images 92 and 93. There might be stenosis here up to 65 % with respect to the distal vessel (series 13, image 53). Distal to this level the cervical right ICA is normal. Left carotid system: No left CCA origin stenosis. Mildly tortuous left CCA with occasional calcified plaque proximal to the bifurcation. Bulky calcified plaque at the left ICA origin and bulb, but no stenosis results (series 12, image 119). Patulous bulb compatible with previous left carotid surgery. Negative cervical left ICA distal to the bulb. Vertebral arteries:No proximal right subclavian artery stenosis despite calcified plaque. The right vertebral artery is occluded at its origin with some calcified plaque, series 12, image 125. There is faint reconstituted enhancement in the distal right V2 segment beginning at C3. The vessel has a more normal albeit non dominant appearance at the C1 level and is patent as it crosses the dura. No proximal left  subclavian artery stenosis despite soft and calcified plaque. Calcified plaque at the left vertebral artery origin but only mild stenosis results. Dominant left vertebral artery with mild tortuosity in the neck and no other atherosclerosis. CTA HEAD Posterior circulation: Dominant left vertebral artery without stenosis in the posterior fossa. Patent vertebrobasilar junction. Diminutive but patent right V4 segment. Right PICA origin is patent. Left PICA origin is diminutive. No basilar artery stenosis. Normal SCA and PCA origins. Posterior communicating arteries are diminutive or absent. Bilateral  PCA branches are within normal limits. Anterior circulation: Both ICA siphons are patent. Minimal calcified plaque on the left with no stenosis. Normal left ophthalmic artery origin. Mild calcified plaque and dolichoectasia on the right with no stenosis. Normal right ophthalmic artery origin. Patent carotid termini. Normal MCA and ACA origins. Tortuous A1 segments. Normal anterior communicating artery. Bilateral ACA branches are mildly irregular but otherwise normal (series 16, image 18). Left MCA M1 segment, bifurcation, and left MCA branches are within normal limits. Right MCA M1 segment, bifurcation, and right MCA bifurcation are patent and within normal limits. There seem to be a paucity of posterior most right M3 branches, but no proximal M2 occlusion is identified. Venous sinuses: Patent. Anatomic variants: Dominant appearing left vertebral artery. Delayed phase: No abnormal enhancement identified. IMPRESSION: 1. Negative for emergent large vessel occlusion associated with the acute right cerebral infarcts. There is a paucity of posterior right MCA M3 branches, but no M2 branch occlusion identified. 2. Complex atherosclerotic plaque at the right ICA origin and bulb with some ulceration. Distal bulb level stenosis which might be hemodynamically significant (estimated at up to 65%). Correlation with right side carotid  Doppler ultrasound may be valuable. 3. Previous left carotid surgery.  Calcified plaque but no stenosis. 4. 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 5. Dominant appearing left vertebral artery with calcified plaque and mild stenosis at its origin. Otherwise negative posterior circulation. 6. Expected evolution of the larger ischemic foci demonstrated yesterday by MRI. No associated hemorrhage or mass effect. 7. Patulous thoracic esophagus containing fluid/debris. Consider achalasia. 8. Advanced cervical spine degeneration. Areas of ankylosis in the upper thoracic spine. Electronically Signed   By: Genevie Ann M.D.   On: 06/05/2015 14:59   Mr Brain Wo Contrast  06/04/2015  CLINICAL DATA:  Stroke.  Hypertension diabetes COPD EXAM: MRI HEAD WITHOUT CONTRAST TECHNIQUE: Multiplanar, multiecho pulse sequences of the brain and surrounding structures were obtained without intravenous contrast. COMPARISON:  None. FINDINGS: Acute infarct in the right corona radiata white matter. Acute infarct extends into the right parietal cortex and right posterior temporal lobe. Acute infarct in the right occipital lobe . Small area of acute infarct in the left parietal periventricular white matter. Moderate to advanced atrophy. Mild chronic microvascular ischemic change in the white matter. Brainstem and cerebellum intact. Negative for intracranial hemorrhage. Negative for mass or edema.  No shift of the midline structures. Paranasal sinuses clear. Normal orbit. Pituitary normal in size. Skullbase intact. IMPRESSION: Acute infarcts in the right parietal lobe. Small areas of acute infarct in the right occipital lobe, right posterior temporal lobe, and left parietal white matter. Advanced atrophy. Electronically Signed   By: Franchot Gallo M.D.   On: 06/04/2015 19:52   Dg Chest Port 1 View  06/03/2015  CLINICAL DATA:  Respiratory  failure with shortness of breath EXAM: PORTABLE CHEST 1 VIEW COMPARISON:  Yesterday FINDINGS: Tracheal and esophageal extubation. On the first image there is lower lung volumes with streaky opacity, but transient based on subsequent x-ray. There is suggestion of a hiatal hernia based on nasogastric tube positioning yesterday. IMPRESSION: 1. Stable aeration after extubation. 2. Persistent dense opacity behind the heart that correlates with history of pneumonia. Based on nasogastric tube positioning yesterday there could also be a hiatal hernia or dilated esophagus. Electronically Signed   By: Monte Fantasia M.D.   On: 06/03/2015 07:24   Dg Chest Port 1  View  06/02/2015  CLINICAL DATA:  Respiratory failure, COPD, healthcare associated pneumonia, intubated patient. EXAM: PORTABLE CHEST 1 VIEW COMPARISON:  Portable chest x-ray of Jun 01, 2015 FINDINGS: The lungs are adequately inflated. Persistent retrocardiac density on the left is demonstrated. Infrahilar increased density on the right has developed. The heart is normal in size. The pulmonary vascularity is not engorged. The endotracheal tube tip lies 2.0 cm above the carina. The esophagogastric tube tip does not lie below the hemidiaphragm though may lie in a hiatal hernia if any. IMPRESSION: Increasing infrahilar density on the right. Persistent left lower lobe atelectasis or pneumonia. Low positioning of the endotracheal tube. Withdrawal by 3-5 cm is recommended. Inadequate positioning of the NG tube. Advancement by approximately 10 cm is recommended. Electronically Signed   By: David  Martinique M.D.   On: 06/02/2015 07:15   Dg Chest Port 1 View  06/01/2015  CLINICAL DATA:  Shortness of breath for 2 days.  Post intubation. EXAM: PORTABLE CHEST 1 VIEW COMPARISON:  None. FINDINGS: Endotracheal tube is approximately 6.1 cm above the carina. The tip of the nasogastric tube is in the upper abdomen and probably near the GE junction. Densities at the medial left lung  base are suggestive for consolidation in the retrocardiac space. Aspiration cannot be excluded. Upper lungs appear to be clear. Heart size is normal. Negative for pneumothorax. IMPRESSION: Left basilar densities are suggestive for consolidation or airspace disease. Findings could represent pneumonia, atelectasis or aspiration. Endotracheal tube is appropriately positioned. Nasogastric tube is near the GE junction. Electronically Signed   By: Markus Daft M.D.   On: 06/01/2015 19:04    Assessment/Plan 1. Essential hypertension B/p stable. Continue on Amlodipine 5 mg Tablet and Losartan 100 mg Tablet   2. COPD with exacerbation ([place]) post hospital admission 06/01/2015-06/09/2015 for Respiratory failure due to COPD exacerbation. He required intubation, steroids and antibiotics. Large copious amounts of food like secretions was suctioned from ET tube concerning for aspiration. Continue with Symbicort and Albuterol Inhaler. Afebrile. Cough and congestion reported this visit. Will order portable CXR Pa/lat prior to discharge r/o PNA.   3. Cerebral thrombosis with cerebral infarction Had CVA during recent [facility] admission 06/01/2015-06/09/2015. Post short term rehabilitation. He has worked with PT/OT. He lucks coordination not safe for discharge but patient and wife insist of discharge home. Patient's wife willing to provide care at home. He will be discharge with Home health PT/OT to continue with ROM, exercise, gait stability and muscle strengthening. He will also require Speech Therapist due to high risk aspiration recent CVA.  He will require standard WC 18 X 18 with cushion, elevating leg rests, extended handbrakes and anti tippers. Continue ASA 81 mg Tablet. Follow with Neurologist Dr. Rosalin Hawking MD [phone]  4. GERD without esophagitis Continue omeprazole 20 mg Capsule.   5. Type 2 diabetes mellitus with stage 2 chronic kidney disease, unspecified long term insulin use status (HCC) CBG's ranges in the  200's-300's. Increased Lantus to 12 units SQ at bedtime. Follow up with PCP to recheck Hgb A1C   6. Moderate dementia with behavioral disturbance No new behavioral issues reported. Continue on Aricept 10 mg Tablet. Assist with ADL's. Aspiration precaution.   7. Abnormality of gait He has worked with PT/OT. He lucks coordination not safe for discharge but patient and wife insist of discharge home. Patient's wife willing to provide care at home. He will be discharge with Home health PT/OT to continue with ROM, exercise, gait stability and muscle strengthening.  Patient is being discharged with the following home health services:   PT/OT to continue with ROM, exercise, gait stability and muscle strengthening.   Speech Therapist due to high risk aspiration recent CVA.    Patient is being discharged with the following durable medical equipment:   A standard WC 18 X 18 with cushion, elevating leg rests, extended handbrakes and anti tippers.   Rx written X 1 month supply   Patient has been advised to f/u with their PCP in 1-2 weeks to bring them up to date on their rehab stay.  Social services at facility was responsible for arranging this appointment.  Pt was provided with a 30 day supply of prescriptions for medications and refills must be obtained from their PCP.  For controlled substances, a more limited supply may be provided adequate until PCP appointment only.  Future labs/tests needed: CBC, BMP in 1-2 weeks with PCP

## 2015-06-17 ENCOUNTER — Encounter: Payer: Self-pay | Admitting: Vascular Surgery

## 2015-06-17 ENCOUNTER — Telehealth: Payer: Self-pay | Admitting: Internal Medicine

## 2015-06-17 NOTE — Telephone Encounter (Signed)
Pt wife called in and pt seen a dr at [facility] place she changed some of his meds.  She wanted to make sure how he was suppose to be taking it and if he should be taking it?      Best number [phone] wife

## 2015-06-18 NOTE — Telephone Encounter (Signed)
I spoke to patient's wife and she will continue with the instructions from the rehab facility until the visit here.

## 2015-06-18 NOTE — Telephone Encounter (Signed)
Should patient take the lantus that was sent home with him from the hospital and rehab, or should he go back to taking his metformin? He is also taking prednisone. He has a hospital follow up visit with you on 07/02/15.

## 2015-06-18 NOTE — Telephone Encounter (Signed)
Would recommend to continue per the instructions from the rehab facility until our visit and take the lantus. If he is having low sugars or high sugars call the office for advice. They can call [facility] for discharge instructions but they should have been given written instructions.

## 2015-06-24 ENCOUNTER — Ambulatory Visit: Payer: Medicare HMO | Admitting: Vascular Surgery

## 2015-06-26 ENCOUNTER — Encounter: Payer: Self-pay | Admitting: Neurology

## 2015-06-28 ENCOUNTER — Other Ambulatory Visit: Payer: Self-pay | Admitting: Internal Medicine

## 2015-06-29 ENCOUNTER — Ambulatory Visit: Payer: Self-pay | Admitting: Internal Medicine

## 2015-07-02 ENCOUNTER — Encounter: Payer: Self-pay | Admitting: Internal Medicine

## 2015-07-02 ENCOUNTER — Ambulatory Visit (INDEPENDENT_AMBULATORY_CARE_PROVIDER_SITE_OTHER): Payer: Medicare HMO | Admitting: Internal Medicine

## 2015-07-02 VITALS — BP 120/68 | HR 78 | Temp 97.9°F | Resp 12 | Ht 69.0 in | Wt 164.0 lb

## 2015-07-02 DIAGNOSIS — I639 Cerebral infarction, unspecified: Secondary | ICD-10-CM

## 2015-07-02 DIAGNOSIS — F482 Pseudobulbar affect: Secondary | ICD-10-CM | POA: Diagnosis not present

## 2015-07-02 DIAGNOSIS — E1122 Type 2 diabetes mellitus with diabetic chronic kidney disease: Secondary | ICD-10-CM | POA: Diagnosis not present

## 2015-07-02 DIAGNOSIS — N179 Acute kidney failure, unspecified: Secondary | ICD-10-CM | POA: Diagnosis not present

## 2015-07-02 DIAGNOSIS — N182 Chronic kidney disease, stage 2 (mild): Secondary | ICD-10-CM

## 2015-07-02 MED ORDER — DEXTROMETHORPHAN-QUINIDINE 20-10 MG PO CAPS: 1.0000 | ORAL_CAPSULE | Freq: Two times a day (BID) | ORAL | Status: DC

## 2015-07-02 NOTE — Assessment & Plan Note (Signed)
Appears to be resolved on recent labs. Will monitor trend.

## 2015-07-02 NOTE — Progress Notes (Signed)
   Subjective:    Patient ID: Chad [name], male    DOB: 07/31/1929, 82 y.o.   MRN: [id number]  HPI The patient is an 82 YO man coming in for follow up of hospitalization and rehab stay (in for pneumonia and unresponsive, intubated, had major stroke, slow recovery with breathing and physical status, went to rehab for some therapy, they recommended keeping him but his wife wanted to take him home). They have been home for about 2 weeks now and is he is needing assistance with ADLs still. He is able to transfer independently and toilet with assistance. He is still weak on the left side, denies numbness. No fevers or chills since being home. Mild cough but not worsened. Appetite is good and no problems with swallowing that she has noticed. He is having crying spells that he is not able to control which is somewhat worrisome for him. He does not like it. No pain. No SOB. No chest pains, nausea, vomiting, diarrhea. Went back to taking metformin daily instead of taking lantus daily as recommended by SNF.   PMH, [facility], social history reviewed and updated.   Review of Systems  Unable to perform ROS: Dementia  Constitutional: Positive for activity change and fatigue. Negative for chills, appetite change and unexpected weight change.  HENT: Negative for congestion, postnasal drip, rhinorrhea, sinus pressure and trouble swallowing.   Respiratory: Positive for cough. Negative for chest tightness, shortness of breath and wheezing.   Cardiovascular: Negative for chest pain, palpitations and leg swelling.  Gastrointestinal: Negative for nausea, vomiting, abdominal pain, diarrhea, constipation, blood in stool and abdominal distention.  Skin: Negative.   Neurological: Positive for speech difficulty and weakness. Negative for seizures and numbness.  Psychiatric/Behavioral:       Crying spells      Objective:   Physical Exam  Constitutional: He appears well-developed and well-nourished.  HENT:  Head:  Normocephalic and atraumatic.  Eyes: EOM are normal.  Neck: Normal range of motion.  Cardiovascular: Normal rate and regular rhythm.   Pulmonary/Chest: Effort normal. No respiratory distress. He has no wheezes. He has no rales.  Lung sounds stable  Abdominal: Soft. Bowel sounds are normal. He exhibits no distension. There is no tenderness. There is no rebound.  Neurological: A cranial nerve deficit is present. Coordination abnormal.  Weak on the left side, sensation intact upper and lower extremities. No overt facial drooping. Hearing is poor at baseline, able to follow 2 step directions.   Skin: Skin is warm and dry.   Filed Vitals:   07/02/15 1106  BP: 120/68  Pulse: 78  Temp: 97.9 F (36.6 C)  TempSrc: Oral  Resp: 12  Height: 5\' 9"  (1.753 m)  Weight: 164 lb (74.39 kg)  SpO2: 96%      Assessment & Plan:

## 2015-07-02 NOTE — Progress Notes (Signed)
Pre visit review using our clinic review tool, if applicable. No additional management support is needed unless otherwise documented below in the visit note. 

## 2015-07-02 NOTE — Assessment & Plan Note (Signed)
Still in recovery and PT/OT/ST coming to the home to work with him starting today. Some significant change from baseline noted on today's exam and not as interactive.

## 2015-07-02 NOTE — Patient Instructions (Signed)
We have sent in a medicine to try for the episodes of crying called nuedexta (also called dextromorphan/quinidine). Have him try 1 capsule daily for the first 1 week. After that he can take 1 pill twice a day.

## 2015-07-02 NOTE — Assessment & Plan Note (Signed)
Rx for dextromorphan/quinidine for the PBA which is likely sequelae of the stroke/hypoxia. 1 pill daily for the first week and then can increase to BID if needed.

## 2015-07-02 NOTE — Assessment & Plan Note (Signed)
Okay with resumption of the metformin daily, will monitor BMP and HgA1c in 1-2 months and if still okay continue or adjust as needed. They will monitor sugars and if consistently >160 will increase to metformin BID.

## 2015-07-06 ENCOUNTER — Encounter: Payer: Self-pay | Admitting: Vascular Surgery

## 2015-07-10 ENCOUNTER — Telehealth: Payer: Self-pay

## 2015-07-10 NOTE — Telephone Encounter (Signed)
April [phone] needs verbal orders O.T 1x week for 4week - fine motor, etc.

## 2015-07-10 NOTE — Telephone Encounter (Signed)
Left message on voice mail giving verbal orders.  

## 2015-07-13 ENCOUNTER — Ambulatory Visit (INDEPENDENT_AMBULATORY_CARE_PROVIDER_SITE_OTHER)
Admission: RE | Admit: 2015-07-13 | Discharge: 2015-07-13 | Disposition: A | Discharge status: Home / Self Care | Payer: Medicare HMO | Source: Ambulatory Visit | Attending: Internal Medicine | Admitting: Internal Medicine

## 2015-07-13 ENCOUNTER — Encounter: Payer: Self-pay | Admitting: Internal Medicine

## 2015-07-13 ENCOUNTER — Ambulatory Visit (INDEPENDENT_AMBULATORY_CARE_PROVIDER_SITE_OTHER): Payer: Medicare HMO | Admitting: Internal Medicine

## 2015-07-13 VITALS — BP 120/74 | HR 61 | Ht 69.0 in | Wt 164.6 lb

## 2015-07-13 DIAGNOSIS — J449 Chronic obstructive pulmonary disease, unspecified: Secondary | ICD-10-CM

## 2015-07-13 NOTE — Patient Instructions (Addendum)
Proair is a better inhaler than proventil because it's got a counter to keep up with how much is left  If starts needing the nebulizer more than usual we need to see him here right away but if it's up to every 4 hours then should go straight to ER  Please remember to go to the  x-ray department downstairs for your tests - we will call you with the results when they are available.  Please schedule a follow up visit in 3 months but call sooner if needed

## 2015-07-13 NOTE — Progress Notes (Signed)
Quick Note:  Spoke with the pt's spouse and notified of results per MW  Nothing further needed ______

## 2015-07-13 NOTE — Progress Notes (Signed)
Subjective:    Patient ID: Chad [name], male    DOB: 1930-01-08    MRN: [id number]  Brief patient profile:  85 yowm quit smoking x 1980 with GOLD II copd by pfts 06/10/08 and freq exac which improve transiently on steroids   History of Present Illness  07/02/2014  Acute ov/Chad [name] re: aecopd/ ab plus ? Pseudoasthma  Chief Complaint  Patient presents with  . Acute Visit    Pt c/o wheezing and cough x 5 days. Cough is non prod. He has also started to have some nasal congestion over the past couple of days. He is using albuterol inhaler approx 3 x per day and has not had to use neb at all.   gradually worse cough/ subj wheeze since last prednisone /win a week or two of ov.  Worse day than noct/ comfortable at rest p saba but use way up over baseline rec Omeprazole Take 30- 60 min before your first and last meals of the day GERD diet  Stop powdered inhalers = spiriva / foradil and start symbicort 160 Take 2 puffs first thing in am and then another 2 puffs about 12 hours later.  Work on inhaler technique:   Only use your albuterol as a rescue medication Only use albuterol nebulizer up to every 4 hours if needed but goal is not need it all  Prednisone 10 mg take  4 each am x 2 days,   2 each am x 2 days,  1 each am x 2 days and stop     01/30/2015 acute extended ov/Chad [name] re: recurrent cough in pt with COPD II  Chief Complaint  Patient presents with  . Acute Visit    Coughing up greenish/yellow mucus. Chest congestion, nasal congestion. Has taken 2 boxes of Mucinex and 1 bottle of liquid Mucinex already.   was better p prev ov then Onset mid November 2016 acute  assoc with stuffy nose  Has flutter not using / poor insight into prns  Cough only  better after hyrdocodone/ no better p prednisone hfa poor  rec When sick with flare of cough/ congestion  mucinex dm 1200 mg every 12 hours and use the flutter as much as possible If having bad cough/ congestion in am > use nebulizer albuterol  first thing in am  Augmentin 875 mg take one pill twice daily  X 10 days - take at breakfast and supper with large glass of water.  It would help reduce the usual side effects (diarrhea and yeast infections) if you ate cultured yogurt at lunch.  Prednisone 10 mg take  4 each am x 2 days,   2 each am x 2 days,  1 each am x 2 days and stop  Work on inhaler technique:  relax and gently blow all the way out then take a nice smooth deep breath back in, triggering the inhaler at same time you start breathing in.  Hold for up to 5 seconds if you can. Blow out thru nose. Rinse and gargle with water when done Please remember to go to the  x-ray department downstairs for your tests - we will call you with the results when they are available. Please schedule a follow up office visit in 6 weeks, call sooner if needed      02/26/2015  f/u ov/Chad [name] re:  COPD II/ maint rx symb 160 2bid and prn saba Chief Complaint  Patient presents with  . Acute Visit    No fever as  of this AM. Denies any chills/sweats/nausea/vomiting. yesterday did cough up green phlem but non today. Had wheezing about 1 day ago.  was fine from 1/6 - 1/24 one episode early am sob > 911 > nl vitals, better s rx  Then 02/25/15 coughed up green for the first time    Changed prilosec to alternative rx 2/1 Rare perceived need for saba / not clear he used action plan  rec Augmentin 875 mg take one pill twice daily  X 10 days  Prilosec 20 mg Take 30- 60 min before your first and last meals of the day  For cough > mucinex dm 1200 up to twice daily and use the flutter as you can  Work on Product manager inhaler technique:        Admit date: 03/11/2015 Discharge date: 03/15/2015     Discharge Diagnoses:  Principal Problem:  Sepsis due to pneumonia [facility]) Active Problems:  HLD (hyperlipidemia)  Essential hypertension  COPD GOLD II   Diabetes mellitus without complication (HCC)  Moderate dementia with behavioral disturbance  Pneumonia   Sepsis ([place])  Acute kidney injury (HCC)> resolved    04/23/2015  f/u ov/Chad [name] re: GOLD II/ maint on symbicort 160 2bid  Chief Complaint  Patient presents with  . Follow-up    Breathing is doing well. No new co's today.   Not limited by breathing from desired activities  But very inactive   rec Please see patient coordinator before you leave today  to schedule ENT eval Dr [name] Try bevespi Take 2 puffs first thing in am and then another 2 puffs about 12 hours later.  Only use your albuterol as a rescue medication     05/25/2015  f/u ov/Chad [name] re: COPD  GOLD II/ maint rx bivespi   Chief Complaint  Patient presents with  . Follow-up    Breathing is overall doing well. Had some wheezing and cough 1 day ago-mucinex has helped.   rarely using any saba hfa/ no neb at all  Doe = MMRC1 = can walk nl pace, flat grade, can't hurry or go uphills or steps s sob   rec  Continue bevespi Take 2 puffs first thing in am and then another 2 puffs about 12 hours later if the [place] doesn't cover it then ok to change back to symbiocrt     06/01/2015 NP/ Acute OV  Pt presents for an acute office visit.  Complains of wheezing, SOB, prod cough with clear mucus, chest tightness/congestion starting on 05/29/15. Denies any sinus congestion/drainage, fever, nausea or vomiting. Called EMS last pm, was given Neb tx. Felt better and declined hospital transport.  Feels some better this am but coughing up thick mucus, and having wheezing .  CXR 5/1 w/ COPD changes, nad.  rec Augmentin 875mg  Twice daily  , take w/ food.  Prednisone taper over next week.  Mucinex DM Twice daily  As needed  Cough/congestion .    5/515/17 ST rec Diet recommendations: Regular;Thin liquid Liquids provided via: Cup   07/13/2015 extended post hosp f/u ov/transition of care/Chad [name] re:  GOLD  II copd/ symbicort 160 2bid Chief Complaint  Patient presents with  . Follow-up    Breathing has improved.   d/c from [facility] x Jun 17 2015 took  another round of augmentin /pred  Cough/congestion Improved mobility since then using  rollator and doing doing better walking hallways and still saba use daily but only p activity/ not noct and no neb (close to baseline)  No obvious day to day or daytime variability or assoc excess/ purulent sputum or mucus plugs or hemoptysis or cp or chest tightness, subjective wheeze or overt sinus or hb symptoms. No unusual exp hx or h/o childhood pna/ asthma or knowledge of premature birth.  Sleeping ok without nocturnal  or early am exacerbation  of respiratory  c/o's or need for noct saba. Also denies any obvious fluctuation of symptoms with weather or environmental changes or other aggravating or alleviating factors except as outlined above   Current Medications, Allergies, Complete Past Medical History, Past Surgical History, Family History, and Social History were reviewed in [employer] record.  ROS  The following are not active complaints unless bolded sore throat, dysphagia, dental problems, itching, sneezing,  nasal congestion or excess/ purulent secretions, ear ache,   fever, chills, sweats, unintended wt loss, classically pleuritic or exertional cp,  orthopnea pnd or leg swelling, presyncope, palpitations, abdominal pain, anorexia, nausea, vomiting, diarrhea  or change in bowel or bladder habits, change in stools or urine, dysuria,hematuria,  rash, arthralgias, visual complaints, headache, numbness, weakness or ataxia or problems with walking or coordination,  change in mood/affect or memory.                       Objective:   amb stoic wm  nad    02/26/2015  171 > 04/23/2015  169 > 05/25/2015   171> 07/13/2015   165      Vital signs reviewed    HEENT: nl dentition, turbinates, and orophanx. Nl external ear canals without cough reflex - edentulous    NECK :  without JVD/Nodes/TM/ nl carotid upstrokes bilaterally   LUNGS: no acc muscle use, minimal insp and exp  rhonchi    CV:  RRR  no s3 or murmur or increase in P2, no edema   ABD:  Protuberant but soft and nontender with nl excursion in the supine position. No bruits or organomegaly, bowel sounds nl  MS:  warm without deformities, calf tenderness, cyanosis or clubbing  SKIN: warm and dry without lesions    NEURO:  alert, knows day of week, not year or President         CXR PA and Lateral:   07/13/2015 :    I personally reviewed images and agree with radiology impression as follows:    The opacity in the retrocardiac region is due to a moderate to large hiatal hernia. There is also prominent cardiophrenic angle fat best seen on the lateral view, stable. There is no evidence of pneumonia or pulmonary edema. Lungs are mildly hyperexpanded. No pleural effusion or pneumothorax.    Assessment & Plan:

## 2015-07-14 ENCOUNTER — Encounter: Payer: Self-pay | Admitting: Internal Medicine

## 2015-07-14 ENCOUNTER — Ambulatory Visit (INDEPENDENT_AMBULATORY_CARE_PROVIDER_SITE_OTHER): Payer: Medicare HMO | Admitting: Vascular Surgery

## 2015-07-14 ENCOUNTER — Encounter: Payer: Self-pay | Admitting: Vascular Surgery

## 2015-07-14 VITALS — BP 136/66 | HR 85 | Ht 69.0 in | Wt 166.0 lb

## 2015-07-14 DIAGNOSIS — I779 Disorder of arteries and arterioles, unspecified: Secondary | ICD-10-CM | POA: Diagnosis not present

## 2015-07-14 DIAGNOSIS — I739 Peripheral vascular disease, unspecified: Principal | ICD-10-CM

## 2015-07-14 NOTE — Progress Notes (Signed)
[facility] [place]  Patient name: Chad [name] MRN: [id number] DOB: Dec 17, 1929 Sex: male  REASON FOR VISIT: Follow-up carotid disease  HPI: Chad [name] is a 82 y.o. male known to me from prior staged bilateral carotid endarterectomies in 1999. He had symptomatic left carotid endarterectomy followed by a symptomatic right carotid endarterectomy. He is done well since that time. He has had recently a progressive deterioration in his overall mental status and also pulmonary status. He's had recent episodes of pneumonia and in May had arrest requiring intubation. 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 He underwent CTA and I have this for review. This revealed no marked stenosis in his right or left endarterectomy sites. There was some irregularity in the right endarterectomy site at the bifurcation. Duplex showed no significant stenosis in his carotid arteries bilaterally. He has been discharged from the hospital and continues to have some weakness. He is here today with his wife who cares for him. She reports that he has some moderate dementia which is somewhat progressive. SSI history is also significant for esophageal resection for premalignant tumors. Multiple competitions associated with this.  Past Medical History  Diagnosis Date  . Fatty liver disease, nonalcoholic   . Coronary artery stenosis   . Diverticulosis of colon   . Diaphragmatic hernia without mention of obstruction or gangrene   . Dyspnea   . Personal history of other malignant neoplasm of skin   . Barrett's esophagus   . TIA (transient ischemic attack) 1998 X "a few"  . Hyperlipidemia   . Acid reflux disease   . Glaucoma   . HTN (hypertension)   . RENAL CALCULUS, HX OF 08/28/2008  . CAROTID ARTERY STENOSIS, BILATERAL 08/27/2008  . Esophageal  stenosis   . COPD (chronic obstructive pulmonary disease) ([place])     "severe" (12/20/2013)  . Chronic bronchitis ([place])     "most q yr; he's had it several times" (12/20/2013)  . Pneumonia 1990's X 4  . Type II diabetes mellitus ([place])   . History of blood transfusion 2007    "related to OR"  . History of hiatal hernia     "repaired when esophagus removed"  . History of stomach ulcers   . Sinus headache     "seasonal"  . Arthritis     "all over"  . Kidney stones     "passed them all" (12/20/2013)  . Basal cell carcinoma of nose   . Diabetes mellitus without complication ([place])   . COPD (chronic obstructive pulmonary disease) ([place])   . Stroke ([place])   . Hypertension   . Glaucoma   . Diverticulitis   . GERD (gastroesophageal reflux disease)   . Arthritis   . High cholesterol   . Renal disorder     Family History  Problem Relation Age of Onset  . Colon cancer Mother   . Lung cancer Mother   . Heart attack Mother   . Colon cancer Maternal Grandfather   . Heart attack Brother   . Heart attack Brother   . Stomach cancer Father   . Esophageal cancer Father     SOCIAL HISTORY: Social History  Substance Use Topics  . Smoking status: Former Smoker -- 1.00 packs/day for 40 years    Types: Cigarettes    Quit date: 01/24/1978  . Smokeless tobacco: Not on file  . Alcohol  Use: No    Allergies  Allergen Reactions  . Avelox [Moxifloxacin Hcl In Nacl] Anaphylaxis  . Moxifloxacin Anaphylaxis    Avelox REACTION: anaphylaxis  . Quinolones Anaphylaxis  . Sulfonamide Derivatives     REACTION: hives  . Timolol Maleate     Causes severe chest congestion  . Timoptic [Timolol Maleate] Other (See Comments)    Congestion  . Sulfa Antibiotics Rash    Current Outpatient Prescriptions  Medication Sig Dispense Refill  . cloNIDine (CATAPRES) 0.2 MG tablet Take 0.2 mg by mouth. Only take 1/2 tablet if systolic is over [id number].    Marland Kitchen potassium chloride (KLOR-CON) 20 MEQ packet Take 20 mEq by  mouth daily.    Marland Kitchen acetaminophen (TYLENOL) 650 MG CR tablet Take 650 mg by mouth every 6 (six) hours as needed for pain.    Marland Kitchen albuterol (PROVENTIL HFA;VENTOLIN HFA) 108 (90 Base) MCG/ACT inhaler Inhale 2 puffs into the lungs every 4 (four) hours as needed for wheezing or shortness of breath.    Marland Kitchen albuterol (PROVENTIL) (2.5 MG/3ML) 0.083% nebulizer solution Take 2.5 mg by nebulization every 4 (four) hours as needed for wheezing or shortness of breath.    Marland Kitchen amLODipine (NORVASC) 5 MG tablet TAKE 1 TABLET(5 MG) BY MOUTH DAILY 30 tablet 0  . aspirin 81 MG tablet Take 81 mg by mouth daily.      . brimonidine (ALPHAGAN) 0.15 % ophthalmic solution Place 1 drop into the left eye 2 (two) times daily. 5 mL 12  . budesonide-formoterol (SYMBICORT) 160-4.5 MCG/ACT inhaler Inhale 2 puffs into the lungs 2 (two) times daily.    . Cholecalciferol (VITAMIN D) 1000 UNITS capsule Take 1,000 Units by mouth daily.      Marland Kitchen Dextromethorphan-Quinidine 20-10 MG CAPS Take 1 capsule by mouth 2 (two) times daily. (Patient taking differently: Take 1 capsule by mouth daily. ) 60 capsule 6  . donepezil (ARICEPT) 10 MG tablet Take 10 mg by mouth at bedtime.    . fluticasone (FLONASE) 50 MCG/ACT nasal spray Place 2 sprays into both nostrils 2 (two) times daily as needed for allergies.     Marland Kitchen LORazepam (ATIVAN) 0.5 MG tablet Take 0.5 mg by mouth every 6 (six) hours as needed for anxiety.    Marland Kitchen losartan (COZAAR) 100 MG tablet TAKE 1 TABLET(100 MG) BY MOUTH DAILY 90 tablet 0  . metFORMIN (GLUCOPHAGE) 500 MG tablet Take 500 mg by mouth daily with breakfast.    . mirtazapine (REMERON) 7.5 MG tablet Take 15 mg by mouth at bedtime.     Marland Kitchen omeprazole (PRILOSEC) 20 MG capsule Take 20 mg by mouth 2 (two) times daily before a meal.     . simvastatin (ZOCOR) 20 MG tablet Take 1 tablet (20 mg total) by mouth daily at 6 PM. 30 tablet 0  . valACYclovir (VALTREX) 500 MG tablet Take 500 mg by mouth 2 (two) times daily as needed. For [employer]    .  vitamin B-12 (CYANOCOBALAMIN) 1000 MCG tablet Take 1,000 mcg by mouth daily.     No current facility-administered medications for this visit.    REVIEW OF SYSTEMS:  [X]  denotes positive finding, [ ]  denotes negative finding Cardiac  Comments:  Chest pain or chest pressure:    Shortness of breath upon exertion: x   Short of breath when lying flat:    Irregular heart rhythm:        Vascular    Pain in calf, thigh, or hip brought on by ambulation:  Pain in feet at night that wakes you up from your sleep:     Blood clot in your veins:    Leg swelling:         Pulmonary    Oxygen at home:    Productive cough:     Wheezing:         Neurologic    Sudden weakness in arms or legs:     Sudden numbness in arms or legs:     Sudden onset of difficulty speaking or slurred speech:    Temporary loss of vision in one eye:     Problems with dizziness:         Gastrointestinal    Blood in stool:     Vomited blood:         Genitourinary    Burning when urinating:     Blood in urine:        Psychiatric    Major depression:         Hematologic    Bleeding problems:    Problems with blood clotting too easily:        Skin    Rashes or ulcers:        Constitutional    Fever or chills:      PHYSICAL EXAM: Filed Vitals:   07/14/15 1545 07/14/15 1547  BP: 140/68 136/66  Pulse: 85   Height: 5\' 9"  (1.753 m)   Weight: 166 lb (75.297 kg)   SpO2: 95%     GENERAL: The patient is a well-nourished male, in no acute distress. The vital signs are documented above. He walks with a walker CARDIAC: There is a regular rate and rhythm.  VASCULAR: Bilateral carotid incisions well-healed with no bruits. 2+ radial pulses bilaterally PULMONARY: There is good air exchange bilaterally without wheezing or rales. ABDOMEN: Soft and non-tender  MUSCULOSKELETAL: There are no major deformities or cyanosis. NEUROLOGIC: No focal weakness or paresthesias are detected. SKIN: There are no ulcers or  rashes noted. PSYCHIATRIC: The patient has a normal affect.  DATA:  CT angiogram and duplex reviewed with findings as above   MEDICAL ISSUES: Had long discussion with patient and his wife. He does have some irregularity in his right carotid bifurcation which could've possibly caused the right brain event. Feels more likely that this was a watershed event related to his respiratory arrest. He certainly would be at significant risk for redo carotid surgery based on his overall clinical deterioration. I have recommended continued observation. Would only consider a redo endarterectomy or other intervention should he continue to have right brain event. We will see him in 6 months with repeat carotid duplex.    Rosetta Posner, MD FACS [facility] Tel [phone] Pager [phone]

## 2015-07-14 NOTE — Assessment & Plan Note (Signed)
-   PFTs 06/10/08  FEV1 1.73 (61%) ratio 55 with ERV 232 and DLCO 87%  - 07/02/2014 try symbicort 160 2bid and stop all powders  - 04/23/2015  > try bevespi less hoarse,some better doe> [facility] covered symbicort  - 07/13/2015  After extensive coaching HFA effectiveness =   90%    I had an extended discussion with the patient reviewing all relevant studies completed to date and  lasting 25 minutes of a 40  minute extended transition of care office  visit   Reviewed in detail with the course of events leading to his last admit p being seen here by NP same day and with pt having refused admit documented in record, the hosp and NH notes available including ST recs (no straws/ otherwise no restrictions) and how to detect deterioration/ monitor condition by need for saba     Each maintenance medication was reviewed in detail including most importantly the difference between maintenance and prns and under what circumstances the prns are to be triggered using an action plan format that is not reflected in the computer generated alphabetically organized AVS.    Please see instructions for details which were reviewed in writing and the patient given a copy highlighting the part that I personally wrote and discussed at today's ov.

## 2015-07-15 ENCOUNTER — Other Ambulatory Visit: Payer: Self-pay | Admitting: Internal Medicine

## 2015-07-15 ENCOUNTER — Telehealth: Payer: Self-pay

## 2015-07-15 DIAGNOSIS — I69398 Other sequelae of cerebral infarction: Secondary | ICD-10-CM | POA: Diagnosis not present

## 2015-07-15 DIAGNOSIS — J449 Chronic obstructive pulmonary disease, unspecified: Secondary | ICD-10-CM | POA: Diagnosis not present

## 2015-07-15 DIAGNOSIS — R269 Unspecified abnormalities of gait and mobility: Secondary | ICD-10-CM | POA: Diagnosis not present

## 2015-07-15 DIAGNOSIS — F0390 Unspecified dementia without behavioral disturbance: Secondary | ICD-10-CM | POA: Diagnosis not present

## 2015-07-15 NOTE — Telephone Encounter (Signed)
Home Health Cert/Plan of Care received (06/29/2015 - 08/27/2015) and placed on MD's desk for signature

## 2015-07-29 ENCOUNTER — Telehealth: Payer: Self-pay | Admitting: Emergency Medicine

## 2015-07-29 NOTE — Telephone Encounter (Signed)
Patients wife called and stated her husbands blood sugar has been running. So she started his 2nd metforman Sunday after dinner.

## 2015-07-30 NOTE — Telephone Encounter (Signed)
Okay, fine.  

## 2015-08-02 ENCOUNTER — Other Ambulatory Visit: Payer: Self-pay | Admitting: Internal Medicine

## 2015-08-03 ENCOUNTER — Other Ambulatory Visit: Payer: Self-pay | Admitting: Internal Medicine

## 2015-08-03 ENCOUNTER — Other Ambulatory Visit: Payer: Self-pay

## 2015-08-03 NOTE — Telephone Encounter (Signed)
Pt spouse lm on triage rq rf of Losartan and Simvastatin.   Both were refilled with AM. Pt spouse informed of same.

## 2015-08-06 ENCOUNTER — Other Ambulatory Visit: Payer: Self-pay | Admitting: Internal Medicine

## 2015-08-07 ENCOUNTER — Other Ambulatory Visit: Payer: Self-pay | Admitting: Neurology

## 2015-08-07 NOTE — Telephone Encounter (Signed)
Aricept refill requested. Per last office note- patient to remain on medication. Refill approved and sent to patient's pharmacy.   

## 2015-08-18 ENCOUNTER — Encounter (HOSPITAL_COMMUNITY): Payer: Self-pay | Admitting: Emergency Medicine

## 2015-08-18 ENCOUNTER — Telehealth: Payer: Self-pay | Admitting: Emergency Medicine

## 2015-08-18 ENCOUNTER — Ambulatory Visit (HOSPITAL_COMMUNITY)
Admission: EM | Admit: 2015-08-18 | Discharge: 2015-08-18 | Disposition: A | Discharge status: Home / Self Care | Payer: Medicare HMO

## 2015-08-18 DIAGNOSIS — Y92099 Unspecified place in other non-institutional residence as the place of occurrence of the external cause: Secondary | ICD-10-CM | POA: Diagnosis not present

## 2015-08-18 DIAGNOSIS — S0103XA Puncture wound without foreign body of scalp, initial encounter: Secondary | ICD-10-CM | POA: Diagnosis not present

## 2015-08-18 DIAGNOSIS — Y92009 Unspecified place in unspecified non-institutional (private) residence as the place of occurrence of the external cause: Principal | ICD-10-CM

## 2015-08-18 DIAGNOSIS — W19XXXA Unspecified fall, initial encounter: Secondary | ICD-10-CM | POA: Diagnosis not present

## 2015-08-18 NOTE — Telephone Encounter (Signed)
Pt has had 2 falls. He hit his head but didn't need stitches. His blood sugar has been running low but his vitals are fine. She was wondering if we can get an order for a nurse. Fax number is [phone]. Please follow up thanks.

## 2015-08-18 NOTE — ED Triage Notes (Signed)
PT fell out of bed this morning and struck his head on the nightstand. PT has a small puncture wound to scalp with no active bleeding. Wife was in room when he fell. No LOC. Mental status at baseline. PT also reports pain in right ankle, but is ambulatory.

## 2015-08-18 NOTE — Discharge Instructions (Signed)
Use bacitracin twice a day.  Return as needed.

## 2015-08-18 NOTE — ED Provider Notes (Signed)
[facility]    CSN: [id number] Arrival date & time: 08/18/15  1004  First Provider Contact:  First MD Initiated Contact with Patient 08/18/15 1102        History   Chief Complaint Chief Complaint  Patient presents with  . Fall    HPI Chad [name] is a 82 y.o. male.    Head Injury  Location:  Frontal Time since incident:  2 hours Mechanism of injury: fall   Mechanism of injury comment:  [address] out of bed and struck night table with head sustaining tiny lac, no loc. Fall:    Fall occurred:  From a bed   Point of impact:  Head   Entrapped after fall: no   Pain details:    Severity:  Mild   Progression:  Unchanged Chronicity:  New Ineffective treatments:  None tried Associated symptoms: no difficulty breathing, no headaches, no loss of consciousness, no seizures and no vomiting     Past Medical History:  Diagnosis Date  . Acid reflux disease   . Arthritis    "all over"  . Arthritis   . Barrett's esophagus   . Basal cell carcinoma of nose   . CAROTID ARTERY STENOSIS, BILATERAL 08/27/2008  . Chronic bronchitis ([place])    "most q yr; he's had it several times" (12/20/2013)  . COPD (chronic obstructive pulmonary disease) ([place])    "severe" (12/20/2013)  . COPD (chronic obstructive pulmonary disease) ([place])   . Coronary artery stenosis   . Diabetes mellitus without complication ([place])   . Diaphragmatic hernia without mention of obstruction or gangrene   . Diverticulitis   . Diverticulosis of colon   . Dyspnea   . Esophageal stenosis   . Fatty liver disease, nonalcoholic   . GERD (gastroesophageal reflux disease)   . Glaucoma   . Glaucoma   . High cholesterol   . History of blood transfusion 2007   "related to OR"  . History of hiatal hernia    "repaired when esophagus removed"  . History of stomach ulcers   . HTN (hypertension)   . Hyperlipidemia   . Hypertension   . Kidney stones    "passed them all" (12/20/2013)  . Personal history of other  malignant neoplasm of skin   . Pneumonia 1990's X 4  . RENAL CALCULUS, HX OF 08/28/2008  . Renal disorder   . Sinus headache    "seasonal"  . Stroke ([place])   . TIA (transient ischemic attack) 1998 X "a few"  . Type II diabetes mellitus [facility])     Patient Active Problem List   Diagnosis Date Noted  . Pseudobulbar affect 07/02/2015  . Dyslipidemia associated with type 2 diabetes mellitus ([place]) 06/12/2015  . Type II diabetes mellitus with stage 2 chronic kidney disease ([place]) 06/12/2015  . GERD without esophagitis 06/12/2015  . Cerebral thrombosis with cerebral infarction 06/05/2015  . Acute CVA (cerebrovascular accident) ([place])   . HCAP (healthcare-associated pneumonia) 06/02/2015  . Urinary incontinence 04/13/2015  . Acute kidney injury ([place]) 03/11/2015  . Hypertensive retinopathy 03/10/2015  . Pseudoaphakia 03/10/2015  . Moderate dementia with behavioral disturbance 03/09/2015  . Depression 03/09/2015  . Abnormal CT scan, sinus 03/03/2015  . Elevated WBC count 09/16/2014  . Legal blindness [place] 08/07/2014  . Routine general medical examination at a health care facility 04/24/2014  . Esophageal stricture 03/12/2014  . Carotid stenosis 02/24/2014  . Barrett's esophagus 10/11/2011  . Macular hole 05/09/2011  . FATTY LIVER DISEASE 08/27/2008  .  HLD (hyperlipidemia) 05/20/2008  . Glaucoma 05/20/2008  . Essential hypertension 05/20/2008  . COPD GOLD II  05/20/2008  . ARTHRITIS 05/20/2008    Past Surgical History:  Procedure Laterality Date  . BASAL CELL CARCINOMA EXCISION    . CAROTID ARTERY ANGIOPLASTY    . CAROTID ENDARTERECTOMY Bilateral 02/1996-03/1996  . CATARACT EXTRACTION W/ INTRAOCULAR LENS IMPLANT Bilateral 04/2007 - 09/2007   "left-right"  . ESOPHAGECTOMY  2007   with stomach pull through  . ESOPHAGOGASTRODUODENOSCOPY (EGD) WITH ESOPHAGEAL DILATION  10/2011  . EYE SURGERY Right 04/2006; 08/2007   "had gas bubbles put in"  . EYE SURGERY Right 01/2007   "air bubble"  . EYE  SURGERY Left 12/12/2013   "cleaned his implant w/laser"  . EYE SURGERY    . GLAUCOMA SURGERY Right 11/2008   "implanted drain tube"  . HERNIA REPAIR    . KNEE ARTHROSCOPY Bilateral 04/1989; 08/1996; 06/1999   "right; left; left"  . KNEE SURGERY    . MOHS SURGERY  ?2002   "tip of nose; basal cell"  . NASAL POLYP EXCISION  2005   "benign"  . PARS PLANA VITRECTOMY Bilateral 01/2007-05/2007  . SHOULDER ARTHROSCOPY Right 04/1998  . SHOULDER SURGERY    . VENTRAL HERNIA REPAIR  02/2006       Home Medications    Prior to Admission medications   Medication Sig Start Date End Date Taking? Authorizing Provider  acetaminophen (TYLENOL) 650 MG CR tablet Take 650 mg by mouth every 6 (six) hours as needed for pain.    Historical Provider, MD  albuterol (PROVENTIL HFA;VENTOLIN HFA) 108 (90 Base) MCG/ACT inhaler Inhale 2 puffs into the lungs every 4 (four) hours as needed for wheezing or shortness of breath.    Historical Provider, MD  albuterol (PROVENTIL) (2.5 MG/3ML) 0.083% nebulizer solution Take 2.5 mg by nebulization every 4 (four) hours as needed for wheezing or shortness of breath.    Historical Provider, MD  amLODipine (NORVASC) 5 MG tablet TAKE 1 TABLET(5 MG) BY MOUTH DAILY 06/29/15   Hoyt Koch, MD  aspirin 81 MG tablet Take 81 mg by mouth daily.      Historical Provider, MD  brimonidine (ALPHAGAN) 0.15 % ophthalmic solution Place 1 drop into the left eye 2 (two) times daily. 06/09/15   Bonnell Public, MD  budesonide-formoterol [facility]) 160-4.5 MCG/ACT inhaler Inhale 2 puffs into the lungs 2 (two) times daily.    Historical Provider, MD  Cholecalciferol (VITAMIN D) 1000 UNITS capsule Take 1,000 Units by mouth daily.      Historical Provider, MD  cloNIDine (CATAPRES) 0.2 MG tablet Take 0.2 mg by mouth. Only take 1/2 tablet if systolic is over [id number].    Historical Provider, MD  Dextromethorphan-Quinidine 20-10 MG CAPS Take 1 capsule by mouth 2 (two) times daily. Patient taking  differently: Take 1 capsule by mouth daily.  07/02/15   Hoyt Koch, MD  donepezil (ARICEPT) 10 MG tablet Take 10 mg by mouth at bedtime.    Historical Provider, MD  donepezil (ARICEPT) 10 MG tablet TAKE 1 TABLET(10 MG) BY MOUTH AT BEDTIME 08/07/15   Cameron Sprang, MD  fluticasone [facility]) 50 MCG/ACT nasal spray Place 2 sprays into both nostrils 2 (two) times daily as needed for allergies.     Historical Provider, MD  LORazepam (ATIVAN) 0.5 MG tablet Take 0.5 mg by mouth every 6 (six) hours as needed for anxiety.    Historical Provider, MD  losartan (COZAAR) 100 MG tablet TAKE 1  TABLET(100 MG) BY MOUTH DAILY 04/07/15   Hoyt Koch, MD  metFORMIN (GLUCOPHAGE) 500 MG tablet Take 500 mg by mouth daily with breakfast.    Historical Provider, MD  mirtazapine (REMERON) 15 MG tablet TAKE 1 TABLET(15 MG) BY MOUTH AT BEDTIME 07/15/15   Hoyt Koch, MD  mirtazapine (REMERON) 7.5 MG tablet Take 15 mg by mouth at bedtime.     Historical Provider, MD  omeprazole (PRILOSEC) 20 MG capsule Take 20 mg by mouth 2 (two) times daily before a meal.     Historical Provider, MD  potassium chloride (KLOR-CON) 20 MEQ packet Take 20 mEq by mouth daily.    Historical Provider, MD  potassium chloride SA (K-DUR,KLOR-CON) 20 MEQ tablet TAKE 1 TABLET(20 MEQ) BY MOUTH DAILY 08/06/15   Hoyt Koch, MD  simvastatin (ZOCOR) 20 MG tablet TAKE 1 TABLET BY MOUTH DAILY AT 6 PM 08/03/15   Hoyt Koch, MD  valACYclovir (VALTREX) 500 MG tablet Take 500 mg by mouth 2 (two) times daily as needed. For [place] Provider, MD  vitamin B-12 (CYANOCOBALAMIN) 1000 MCG tablet Take 1,000 mcg by mouth daily.    Historical Provider, MD    Family History Family History  Problem Relation Age of Onset  . Colon cancer Mother   . Lung cancer Mother   . Heart attack Mother   . Stomach cancer Father   . Esophageal cancer Father   . Colon cancer Maternal Grandfather   . Heart attack Brother   .  Heart attack Brother     Social History Social History  Substance Use Topics  . Smoking status: Former Smoker    Packs/day: 1.00    Years: 40.00    Types: Cigarettes    Quit date: 01/24/1978  . Smokeless tobacco: Not on file  . Alcohol use No     Allergies   Avelox [moxifloxacin hcl in nacl]; Moxifloxacin; Quinolones; Sulfonamide derivatives; Timolol maleate; Timoptic [timolol maleate]; and Sulfa antibiotics   Review of Systems Review of Systems  Constitutional: Negative.   HENT: Negative.   Eyes: Negative.   Cardiovascular: Negative.   Gastrointestinal: Negative for vomiting.  Skin: Positive for wound.  Neurological: Negative for dizziness, seizures, loss of consciousness, weakness and headaches.  All other systems reviewed and are negative.    Physical Exam Triage Vital Signs ED Triage Vitals  Enc Vitals Group     BP 08/18/15 1057 148/70     Pulse Rate 08/18/15 1057 73     Resp 08/18/15 1057 16     Temp 08/18/15 1057 98.3 F (36.8 C)     Temp Source 08/18/15 1057 Oral     SpO2 08/18/15 1057 94 %     Weight --      Height --      Head Circumference --      Peak Flow --      Pain Score 08/18/15 1059 5     Pain Loc --      Pain Edu? --      Excl. in [place]? --    No data found.   Updated Vital Signs BP 148/70   Pulse 73   Temp 98.3 F (36.8 C) (Oral)   Resp 16   SpO2 94%   Visual Acuity Right Eye Distance:   Left Eye Distance:   Bilateral Distance:    Right Eye Near:   Left Eye Near:    Bilateral Near:     Physical Exam  Constitutional: He is oriented to person, place, and time. He appears well-developed and well-nourished. No distress.  HENT:  Head: Normocephalic.  25mm puncture wound to right parietal scalp, no bleeding.  Neck: Normal range of motion. Neck supple.  Cardiovascular: Normal rate, regular rhythm and normal heart sounds.   Pulmonary/Chest: Breath sounds normal.  Musculoskeletal: Normal range of motion.  Neurological: He is alert  and oriented to person, place, and time. No cranial nerve deficit.  Skin: Skin is warm and dry.  58mm puncture lac, bacitracin applied.  Nursing note and vitals reviewed.    UC Treatments / Results  Labs (all labs ordered are listed, but only abnormal results are displayed) Labs Reviewed - No data to display  EKG  EKG Interpretation None       Radiology No results found.  Procedures Procedures (including critical care time)  Medications Ordered in UC Medications - No data to display   Initial Impression / Assessment and Plan / UC Course  I have reviewed the triage vital signs and the nursing notes.  Pertinent labs & imaging results that were available during my care of the patient were reviewed by me and considered in my medical decision making (see chart for details).  Clinical Course  tet utd.  Final Clinical Impressions(s) / UC Diagnoses   Final diagnoses:  None    New Prescriptions New Prescriptions   No medications on file     Billy Fischer, MD 08/18/15 1127

## 2015-08-19 ENCOUNTER — Ambulatory Visit (INDEPENDENT_AMBULATORY_CARE_PROVIDER_SITE_OTHER): Payer: Medicare HMO | Admitting: Neurology

## 2015-08-19 ENCOUNTER — Other Ambulatory Visit: Payer: Self-pay | Admitting: Neurology

## 2015-08-19 ENCOUNTER — Encounter: Payer: Self-pay | Admitting: Neurology

## 2015-08-19 VITALS — BP 134/67 | HR 64 | Ht 69.0 in | Wt 167.2 lb

## 2015-08-19 DIAGNOSIS — I6330 Cerebral infarction due to thrombosis of unspecified cerebral artery: Secondary | ICD-10-CM

## 2015-08-19 DIAGNOSIS — I63413 Cerebral infarction due to embolism of bilateral middle cerebral arteries: Secondary | ICD-10-CM

## 2015-08-19 DIAGNOSIS — I10 Essential (primary) hypertension: Secondary | ICD-10-CM

## 2015-08-19 DIAGNOSIS — I6523 Occlusion and stenosis of bilateral carotid arteries: Secondary | ICD-10-CM

## 2015-08-19 DIAGNOSIS — E1310 Other specified diabetes mellitus with ketoacidosis without coma: Secondary | ICD-10-CM | POA: Diagnosis not present

## 2015-08-19 DIAGNOSIS — E1110 Type 2 diabetes mellitus with ketoacidosis without coma: Secondary | ICD-10-CM

## 2015-08-19 DIAGNOSIS — E785 Hyperlipidemia, unspecified: Secondary | ICD-10-CM

## 2015-08-19 MED ORDER — CLOPIDOGREL BISULFATE 75 MG PO TABS: 75.0000 mg | ORAL_TABLET | Freq: Every day | ORAL | 5 refills | Status: DC

## 2015-08-19 NOTE — Progress Notes (Signed)
STROKE NEUROLOGY FOLLOW UP NOTE  NAME: Chad [name] DOB: 09-May-1929  REASON FOR VISIT: stroke follow up HISTORY FROM: wife and chart  Today we had the pleasure of seeing Chad [name] in follow-up at our [facility]rology [facility]. Pt was accompanied by wife.   History Summary Chad [name] is a 82 y.o. male with history of COPD, diabetes mellitus, hypertension, renal disease, hyperlipidemia,  previous stroke admitted on 06/01/15 for pneumonia and respiratory failure. Intubated and admitted to MICU. He self-extubated and did not require reintubation. However found to be leaning over to the left and have left arm and wrist kept in flexed position. MRI brain showed right parietal, right occipital, right posterior temporal and left parietal lobes. CTA head and neck showed right ICA 65% stenosis, and right VA chronic occlusion. EF 65-70%, LDL 95 and A1C 8.3. His stroke more concerning for embolic pattern vs. Watershed with low BP during intubation and respiratory distress. However, recommend 30 day cardiac monitoring as outpt and follow up with [facility] as outpt. Continued on ASA 81 and zocor on discharge to CIR.   Interval History During the interval time, the patient has been doing better.  Had [facility] consult with Dr. Donnetta Hutching in 06/2015, not able to attribute current strokes to right ICA stenosis as it more embolic pattern vs. Watershed. Dr. Donnetta Hutching offered continue medical management at this time. 30 day cardiac monitoring has not done yet. Left arm weakness much improved. Glucose controlled better and BP 134/67. Wife concerns lethargy and fatigue at home.   REVIEW OF SYSTEMS: Full 14 system review of systems performed and notable only for those listed below and in HPI above, all others are negative:  Constitutional:  Activity change Cardiovascular:  Ear/Nose/Throat:  Hearing loss, runny nose Skin:  Eyes:  Eye discharge, light sensitivity Respiratory:  cough Gastroitestinal:  Incontinence of  bowels Genitourinary: incontinence of bladder Hematology/Lymphatic:   Endocrine:  Musculoskeletal:   Allergy/Immunology:   Neurological:  Memory loss, dizziness Psychiatric: agitation, confusion, depression, anxiety and nervousness.  Sleep: sleep talking  The following represents the patient's updated allergies and side effects list: Allergies  Allergen Reactions  . Avelox [Moxifloxacin Hcl In Nacl] Anaphylaxis  . Moxifloxacin Anaphylaxis    Avelox REACTION: anaphylaxis  . Quinolones Anaphylaxis  . Sulfonamide Derivatives     REACTION: hives  . Timolol Maleate     Causes severe chest congestion  . Timoptic [Timolol Maleate] Other (See Comments)    Congestion  . Sulfa Antibiotics Rash    The neurologically relevant items on the patient's problem list were reviewed on today's visit.  Neurologic Examination  A problem focused neurological exam (12 or more points of the single system neurologic examination, vital signs counts as 1 point, cranial nerves count for 8 points) was performed.  Blood pressure 134/67, pulse 64, height 5\' 9"  (1.753 m), weight 167 lb 3.2 oz (75.8 kg).  General - Well nourished, well developed, in no apparent distress.  Ophthalmologic - SFundi not visualized due to light sensitivity.  Cardiovascular - Regular rate and rhythm with no murmur.  Mental Status -  Level of arousal and orientation to person were intact, not orientated to place, time and situation. Language including expression, naming, repetition, comprehension was assessed and found intact.  Cranial Nerves II - XII - II - Visual field intact OU. III, IV, VI - Extraocular movements intact. V - Facial sensation intact bilaterally. VII - Facial movement intact bilaterally. VIII - Hearing & vestibular intact bilaterally. X -  Palate elevates symmetrically. XI - Chin turning & shoulder shrug intact bilaterally. XII - Tongue protrusion intact.  Motor Strength - The patient's strength was  normal in all extremities and pronator drift was absent.  Bulk was normal and fasciculations were absent.   Motor Tone - Muscle tone was assessed at the neck and appendages and was normal.  Reflexes - The patient's reflexes were 1+ in all extremities and he had no pathological reflexes.  Sensory - Light touch, temperature/pinprick were assessed and were normal.    Coordination - The patient had normal movements in the hands, but left FTN with subtle ataxia or dysmetria.  Tremor was absent.  Gait and Station - walk without device, slow with small stride.   Functional score  mRS = 3   0 - No symptoms.   1 - No significant disability. Able to carry out all usual activities, despite some symptoms.   2 - Slight disability. Able to look after own affairs without assistance, but unable to carry out all previous activities.   3 - Moderate disability. Requires some help, but able to walk unassisted.   4 - Moderately severe disability. Unable to attend to own bodily needs without assistance, and unable to walk unassisted.   5 - Severe disability. Requires constant nursing care and attention, bedridden, incontinent.   6 - Dead.   NIH Stroke Scale   Level Of Consciousness 0=Alert; keenly responsive 1=Not alert, but arousable by minor stimulation 2=Not alert, requires repeated stimulation 3=Responds only with reflex movements 0  LOC Questions to Month and Age 80=Answers both questions correctly 1=Answers one question correctly 2=Answers neither question correctly 2  LOC Commands      -Open/Close eyes     -Open/close grip 0=Performs both tasks correctly 1=Performs one task correctly 2=Performs neighter task correctly 0  Best Gaze 0=Normal 1=Partial gaze palsy 2=Forced deviation, or total gaze paresis 0  Visual 0=No visual loss 1=Partial hemianopia 2=Complete hemianopia 3=Bilateral hemianopia (blind including cortical blindness) 0  Facial Palsy 0=Normal symmetrical  movement 1=Minor paralysis (asymmetry) 2=Partial paralysis (lower face) 3=Complete paralysis (upper and lower face) 0  Motor  0=No drift, limb holds posture for full 10 seconds 1=Drift, limb holds posture, no drift to bed 2=Some antigravity effort, cannot maintain posture, drifts to bed 3=No effort against gravity, limb falls 4=No movement Right Arm 0     Leg 0    Left Arm 0     Leg 0  Limb Ataxia 0=Absent 1=Present in one limb 2=Present in two limbs 1  Sensory 0=Normal 1=Mild to moderate sensory loss 2=Severe to total sensory loss 0  Best Language 0=No aphasia, normal 1=Mild to moderate aphasia 2=Mute, global aphasia 3=Mute, global aphasia 0  Dysarthria 0=Normal 1=Mild to moderate 2=Severe, unintelligible or mute/anarthric 1  Extinction/Neglect 0=No abnormality 1=Extinction to bilateral simultaneous stimulation 2=Profound neglect 0  Total   4     Data reviewed: I personally reviewed the images and agree with the radiology interpretations.  Mr Brain Wo Contrast 06/04/2015   Acute infarcts in the right parietal lobe. Small areas of acute infarct in the right occipital lobe, right posterior temporal lobe, and left parietal white matter. Advanced atrophy.    CT Angio Head and Neck  06/05/2015 1. Negative for emergent large vessel occlusion associated with the acute right cerebral infarcts. There is a paucity of posterior right MCA M3 branches, but no M2 branch occlusion identified. 2. Complex atherosclerotic plaque at the right ICA origin and bulb with some ulceration.  Distal bulb level stenosis which might be hemodynamically significant (estimated at up to 65%). Correlation with right side carotid Doppler ultrasound may be valuable. 3. Previous left carotid surgery. Calcified plaque but no stenosis. 4. 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 5. Dominant  appearing left vertebral artery with calcified plaque and mild stenosis at its origin. Otherwise negative posterior circulation. 6. Expected evolution of the larger ischemic foci demonstrated yesterday by MRI. No associated hemorrhage or mass effect. 7. Patulous thoracic esophagus containing fluid/debris. Consider achalasia. 8. Advanced cervical spine degeneration. Areas of ankylosis in the upper thoracic spine.   LE venous doppler - - No evidence of deep vein or superficial thrombosis involving the  right lower extremity and left lower extremity. - No evidence of Baker&'s cyst on the right or left.  TTE  Study Conclusions - Left ventricle: The cavity size was normal. Wall thickness was  increased in a pattern of mild LVH. Systolic function was  vigorous. The estimated ejection fraction was in the range of 65%  to 70%. Wall motion was normal; there were no regional wall  motion abnormalities. Doppler parameters are consistent with  abnormal left ventricular relaxation (grade 1 diastolic  dysfunction). - Aortic valve: There was no stenosis. - Mitral valve: There was no significant regurgitation. - Right ventricle: The cavity size was normal. Systolic function  was normal. - Tricuspid valve: Peak RV-RA gradient (S): 41 mm Hg. - Pulmonary arteries: PA peak pressure: 44 mm Hg (S). - Inferior vena cava: The vessel was normal in size. The  respirophasic diameter changes were in the normal range (>= 50%),  consistent with normal central venous pressure. Impressions: - Normal LV size with mild LV hypertrophy. EF 65-70%. Normal RV  size and systolic function. Mild pulmonary hypertension.  Component     Latest Ref Rng & Units 06/05/2015  Cholesterol     0 - 200 mg/dL 162  Triglycerides     <150 mg/dL 106  HDL Cholesterol     >40 mg/dL 46  Total CHOL/HDL Ratio     RATIO 3.5  VLDL     0 - 40 mg/dL 21  LDL (calc)     0 - 99 mg/dL 95  Hemoglobin A1C     4.8 - 5.6 % 8.3  (H)  Mean Plasma Glucose     mg/dL 192  TSH     0.350 - 4.500 uIU/mL 1.326  Vitamin B12     180 - 914 pg/mL 2,099 (H)    Assessment: As you may recall, he is a 82 y.o. Caucasian male with PMH of COPD, diabetes mellitus, hypertension, renal disease, hyperlipidemia, carotid CEA b/l in 2014, previous stroke admitted on 06/01/15 for pneumonia and respiratory failure. Intubated and admitted to MICU. He self-extubated and did not require reintubation. However found to be leaning over to the left and have left arm and wrist kept in flexed position. MRI brain showed right parietal, right occipital, right posterior temporal and left parietal lobes. CTA head and neck showed right ICA 65% stenosis, and right VA chronic occlusion. EF 65-70%, LDL 95 and A1C 8.3. His stroke more concerning for embolic pattern vs. Watershed with low BP during intubation and respiratory distress. However, recommend 30 day cardiac monitoring as outpt but not done yet. Will follow up with [facility] as outpt. Continued on ASA 81 and zocor on discharge to CIR. During the interval time, the patient  has been doing better.  Had [facility] consult with Dr. Donnetta Hutching in 06/2015, not able to attribute current strokes to right ICA stenosis as it more embolic pattern vs. Watershed. Dr. Donnetta Hutching offered continue medical management at this time. 30 day cardiac monitoring has not done yet. Left arm weakness much improved. Switch from ASA to plavix.  Plan:  - change from ASA 81mg  to plavix 75mg  for stroke prevention - continue simvastatin for stroke prevention - will do 30 day cardiac event monitoring to rule out afib - Follow up with your primary care physician for stroke risk factor modification. Recommend maintain blood pressure goal <130/80, diabetes with hemoglobin A1c goal below 7.0% and lipids with LDL cholesterol goal below 70 mg/dL.  - check BP and glucose at home and record. BP goal 120-140 - continue home PT/OT and avoid fallstr - diabetic diet and home  exercise - follow up in 3 months and then he can continue to follow up with Dr. Delice Lesch.   I spent more than 25 minutes of face to face time with the patient. Greater than 50% of time was spent in counseling and coordination of care. We discussed BP and glucose control, further work up with cardiac monitoring and switch from ASA to plavix.    Orders Placed This Encounter  Procedures  . Cardiac event monitor    Standing Status:   Future    Standing Expiration Date:   08/19/2016    Scheduling Instructions:     Request cardionet setup. Thank you.    Order Specific Question:   Where should this test be performed?    Answer:   [facility]    Meds ordered this encounter  Medications  . clopidogrel (PLAVIX) 75 MG tablet    Sig: Take 1 tablet (75 mg total) by mouth daily.    Dispense:  30 tablet    Refill:  5    Patient Instructions  - change from ASA 81mg  to plavix 75mg  for stroke prevention - continue simvastatin for stroke prevention - will do 30 day cardiac event monitoring to rule out afib - Follow up with your primary care physician for stroke risk factor modification. Recommend maintain blood pressure goal <130/80, diabetes with hemoglobin A1c goal below 7.0% and lipids with LDL cholesterol goal below 70 mg/dL.  - check BP and glucose at home and record. BP goal 120-140 - continue home PT/OT and avoid fall - diabetic diet and home exercise - follow up in 3 months.     Rosalin Hawking, MD PhD [facility] [address], [place], [place] [address] [phone]

## 2015-08-19 NOTE — Telephone Encounter (Signed)
Okay, will not proceed with home health, let me know if they change their mind.

## 2015-08-19 NOTE — Telephone Encounter (Signed)
Spoke to patient's wife and she said that the physical therapist suggested home health. Patient's wife said she does not think they need it. She is able to take care of her husband. He has had two falls in the last week, but he is ok.

## 2015-08-19 NOTE — Telephone Encounter (Signed)
Does he want a nurse to dress his head wound? I'm not sure what he is wanting.

## 2015-08-19 NOTE — Patient Instructions (Addendum)
-   change from ASA 81mg  to plavix 75mg  for stroke prevention - continue simvastatin for stroke prevention - will do 30 day cardiac event monitoring to rule out afib - Follow up with your primary care physician for stroke risk factor modification. Recommend maintain blood pressure goal <130/80, diabetes with hemoglobin A1c goal below 7.0% and lipids with LDL cholesterol goal below 70 mg/dL.  - check BP and glucose at home and record. BP goal 120-140 - continue home PT/OT and avoid fall - diabetic diet and home exercise - follow up in 3 months.

## 2015-08-19 NOTE — Telephone Encounter (Signed)
Left message for patient's wife to call me back to clarify what they need a nurse for.

## 2015-08-20 ENCOUNTER — Telehealth: Payer: Self-pay | Admitting: Internal Medicine
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
## 2015-08-25 ENCOUNTER — Ambulatory Visit (INDEPENDENT_AMBULATORY_CARE_PROVIDER_SITE_OTHER): Payer: Medicare HMO

## 2015-08-25 ENCOUNTER — Other Ambulatory Visit: Payer: Self-pay | Admitting: *Deleted

## 2015-08-25 ENCOUNTER — Other Ambulatory Visit: Payer: Self-pay | Admitting: Neurology

## 2015-08-25 DIAGNOSIS — I639 Cerebral infarction, unspecified: Secondary | ICD-10-CM | POA: Diagnosis not present

## 2015-08-25 DIAGNOSIS — I4891 Unspecified atrial fibrillation: Secondary | ICD-10-CM

## 2015-08-25 DIAGNOSIS — I6330 Cerebral infarction due to thrombosis of unspecified cerebral artery: Secondary | ICD-10-CM

## 2015-08-25 DIAGNOSIS — I6523 Occlusion and stenosis of bilateral carotid arteries: Secondary | ICD-10-CM

## 2015-08-27 ENCOUNTER — Other Ambulatory Visit (INDEPENDENT_AMBULATORY_CARE_PROVIDER_SITE_OTHER): Payer: Medicare HMO

## 2015-08-27 ENCOUNTER — Telehealth: Payer: Self-pay | Admitting: Internal Medicine

## 2015-08-27 ENCOUNTER — Encounter: Payer: Self-pay | Admitting: Internal Medicine

## 2015-08-27 ENCOUNTER — Ambulatory Visit (INDEPENDENT_AMBULATORY_CARE_PROVIDER_SITE_OTHER): Payer: Medicare HMO | Admitting: Internal Medicine

## 2015-08-27 VITALS — BP 150/60 | HR 80 | Temp 98.5°F | Resp 16 | Ht 69.0 in | Wt 166.0 lb

## 2015-08-27 DIAGNOSIS — E785 Hyperlipidemia, unspecified: Secondary | ICD-10-CM | POA: Diagnosis not present

## 2015-08-27 DIAGNOSIS — E1110 Type 2 diabetes mellitus with ketoacidosis without coma: Secondary | ICD-10-CM

## 2015-08-27 DIAGNOSIS — E1310 Other specified diabetes mellitus with ketoacidosis without coma: Secondary | ICD-10-CM

## 2015-08-27 DIAGNOSIS — I10 Essential (primary) hypertension: Secondary | ICD-10-CM

## 2015-08-27 DIAGNOSIS — F482 Pseudobulbar affect: Secondary | ICD-10-CM | POA: Diagnosis not present

## 2015-08-27 LAB — COMPREHENSIVE METABOLIC PANEL
ALBUMIN: 4.6 g/dL (ref 3.5–5.2)
ALT: 20 U/L (ref 0–53)
AST: 16 U/L (ref 0–37)
Alkaline Phosphatase: 71 U/L (ref 39–117)
BUN: 28 mg/dL — AB (ref 6–23)
CHLORIDE: 102 meq/L (ref 96–112)
CO2: 29 mEq/L (ref 19–32)
Calcium: 10.5 mg/dL (ref 8.4–10.5)
Creatinine, Ser: 1.54 mg/dL — ABNORMAL HIGH (ref 0.40–1.50)
GFR: 46 mL/min — ABNORMAL LOW (ref >60.00)
Glucose, Bld: 83 mg/dL (ref 70–99)
POTASSIUM: 4.5 meq/L (ref 3.5–5.1)
SODIUM: 141 meq/L (ref 135–145)
Total Bilirubin: 0.5 mg/dL (ref 0.2–1.2)
Total Protein: 7.7 g/dL (ref 6.0–8.3)

## 2015-08-27 LAB — POCT URINALYSIS DIPSTICK
BILIRUBIN UA: NEGATIVE
GLUCOSE UA: NEGATIVE
Ketones, UA: NEGATIVE
Leukocytes, UA: NEGATIVE
NITRITE UA: NEGATIVE
Protein, UA: NEGATIVE
RBC UA: NEGATIVE
Spec Grav, UA: >=1.03
Urobilinogen, UA: NEGATIVE
pH, UA: 6

## 2015-08-27 LAB — LIPID PANEL
CHOLESTEROL: 125 mg/dL (ref 0–200)
HDL: 49.6 mg/dL (ref >39.00)
LDL CALC: 37 mg/dL (ref 0–99)
NonHDL: 75.55
Total CHOL/HDL Ratio: 3
Triglycerides: 194 mg/dL — ABNORMAL HIGH (ref 0.0–149.0)
VLDL: 38.8 mg/dL (ref 0.0–40.0)

## 2015-08-27 LAB — HEMOGLOBIN A1C: Hgb A1c MFr Bld: 7.3 % — ABNORMAL HIGH (ref 4.6–6.5)

## 2015-08-27 MED ORDER — DEXTROMETHORPHAN-QUINIDINE 20-10 MG PO CAPS: 1.0000 | ORAL_CAPSULE | Freq: Two times a day (BID) | ORAL | 6 refills | Status: DC

## 2015-08-27 MED ORDER — AMLODIPINE BESYLATE 10 MG PO TABS: 10.0000 mg | ORAL_TABLET | Freq: Every day | ORAL | 6 refills | Status: DC

## 2015-08-27 NOTE — Progress Notes (Signed)
Pre visit review using our clinic review tool, if applicable. No additional management support is needed unless otherwise documented below in the visit note. 

## 2015-08-27 NOTE — Telephone Encounter (Signed)
[facility] called request verbal order to continue skill nursing and speech therapist. We gave them order last week but they didn't get a chance to see the pt because he had a doctor appt. Please call them back and ask for any nurse on duty to give verbal.

## 2015-08-27 NOTE — Assessment & Plan Note (Signed)
Checking HgA1c on his metformin BID now. 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

## 2015-08-27 NOTE — Telephone Encounter (Signed)
Called and gave verbal orders for ST and skilled nursing.

## 2015-08-27 NOTE — Assessment & Plan Note (Signed)
BP above goal and increase amlodipine back to 10 mg daily which was doing well before, continue losartan 100 mg and clonidine prn.

## 2015-08-27 NOTE — Progress Notes (Signed)
   Subjective:    Patient ID: Chad [name], male    DOB: 12-08-1929, 82 y.o.   MRN: [id number]  HPI The patient is an 82 YO man coming in for follow up of his blood pressure (running higher than goal <130/<80 at home and mostly in the 150-160/80 range, taking amlodipine 5 mg daily and losartan 100 and clonidine prn but not using), and his blood sugars (now taking metformin BID and sugars doing better lately, no lows, not much activity) and his pseudobulbar affect (need a generic medicine for nudexa as it is costly but helping with his symptoms). No new concerns.   Review of Systems  Unable to perform ROS: Dementia  Constitutional: Positive for activity change and fatigue. Negative for appetite change, chills and unexpected weight change.  HENT: Negative for congestion, postnasal drip, rhinorrhea, sinus pressure and trouble swallowing.   Respiratory: Negative for chest tightness, shortness of breath and wheezing.   Cardiovascular: Negative for chest pain, palpitations and leg swelling.  Gastrointestinal: Negative for abdominal distention, abdominal pain, blood in stool, constipation, diarrhea, nausea and vomiting.  Skin: Negative.   Neurological: Positive for speech difficulty and weakness. Negative for seizures and numbness.      Objective:   Physical Exam  Constitutional: He appears well-developed and well-nourished.  HENT:  Head: Normocephalic and atraumatic.  Eyes: EOM are normal.  Neck: Normal range of motion.  Cardiovascular: Normal rate and regular rhythm.   Pulmonary/Chest: Effort normal. No respiratory distress. He has no wheezes. He has no rales.  Lung sounds stable  Abdominal: Soft. Bowel sounds are normal. He exhibits no distension. There is no tenderness. There is no rebound.  Neurological: Coordination abnormal.  Hearing is poor at baseline, able to follow 2 step directions.   Skin: Skin is warm and dry.   Vitals:   08/27/15 1529  BP: (!) 150/60  Pulse: 80  Resp:  16  Temp: 98.5 F (36.9 C)  TempSrc: Oral  SpO2: 96%  Weight: 166 lb (75.3 kg)  Height: 5\' 9"  (1.753 m)      Assessment & Plan:

## 2015-08-27 NOTE — Assessment & Plan Note (Signed)
Checking levels since change to medicine. Goal LDL <70. Taking simvastatin 20 mg daily now.

## 2015-08-27 NOTE — Patient Instructions (Signed)
We will check the blood work and the urine today and call you back with the results.   We have given you the prescription and send in the stronger amlodipine.   Take 2 pills of the amlodipine daily and when you get the new one it will be stronger and you will just take 1 pill daily.

## 2015-08-27 NOTE — Assessment & Plan Note (Signed)
Doing well with new medicine and given rx for generic if available to get instead due to cost.

## 2015-09-01 ENCOUNTER — Other Ambulatory Visit: Payer: Self-pay | Admitting: Internal Medicine

## 2015-09-03 ENCOUNTER — Other Ambulatory Visit: Payer: Self-pay | Admitting: Internal Medicine

## 2015-09-08 ENCOUNTER — Telehealth: Payer: Self-pay | Admitting: *Deleted

## 2015-09-08 NOTE — Telephone Encounter (Signed)
Called pharmacy spoke w/Natalie she stated pt did turn in Dextromethorphan-Quinidine which is the generic for Nudexa, but the generic has not came out yet so he is wanting something different due to cost../lmb

## 2015-09-08 NOTE — Telephone Encounter (Signed)
There is not a different medicine that does as well as this. I will look into it to see if there are some other that are similar.

## 2015-09-08 NOTE — Telephone Encounter (Signed)
They were given rx at visit.

## 2015-09-08 NOTE — Telephone Encounter (Signed)
Rec'd call from pharmacist Lanelle Bal) she states pt stated MD was suppose to send something over an cheaper alternative for the Nudexa, but they never receive...Chad [name]

## 2015-09-09 NOTE — Telephone Encounter (Signed)
Notified pt/wife w/MD response../lmb 

## 2015-09-11 ENCOUNTER — Ambulatory Visit (INDEPENDENT_AMBULATORY_CARE_PROVIDER_SITE_OTHER): Payer: Medicare HMO | Admitting: Internal Medicine

## 2015-09-11 ENCOUNTER — Encounter: Payer: Self-pay | Admitting: Internal Medicine

## 2015-09-11 VITALS — BP 130/70 | HR 63 | Temp 98.7°F | Resp 14 | Ht 69.0 in | Wt 165.0 lb

## 2015-09-11 DIAGNOSIS — F0391 Unspecified dementia with behavioral disturbance: Secondary | ICD-10-CM | POA: Diagnosis not present

## 2015-09-11 DIAGNOSIS — E118 Type 2 diabetes mellitus with unspecified complications: Secondary | ICD-10-CM | POA: Diagnosis not present

## 2015-09-11 DIAGNOSIS — Z0000 Encounter for general adult medical examination without abnormal findings: Secondary | ICD-10-CM

## 2015-09-11 DIAGNOSIS — F03B18 Unspecified dementia, moderate, with other behavioral disturbance: Secondary | ICD-10-CM

## 2015-09-11 NOTE — Assessment & Plan Note (Signed)
Stable on aricept. 

## 2015-09-11 NOTE — Patient Instructions (Addendum)
Keep up the good work with the walking and therapy.   Health Maintenance, Male A healthy lifestyle and preventative care can promote health and wellness.  Maintain regular health, dental, and eye exams.  Eat a healthy diet. Foods like vegetables, fruits, whole grains, low-fat dairy products, and lean protein foods contain the nutrients you need and are low in calories. Decrease your intake of foods high in solid fats, added sugars, and salt. Get information about a proper diet from your health care provider, if necessary.  Regular physical exercise is one of the most important things you can do for your health. Most adults should get at least 150 minutes of moderate-intensity exercise (any activity that increases your heart rate and causes you to sweat) each week. In addition, most adults need muscle-strengthening exercises on 2 or more days a week.   Maintain a healthy weight. The body mass index (BMI) is a screening tool to identify possible weight problems. It provides an estimate of body fat based on height and weight. Your health care provider can find your BMI and can help you achieve or maintain a healthy weight. For males 20 years and older:  A BMI below 18.5 is considered underweight.  A BMI of 18.5 to 24.9 is normal.  A BMI of 25 to 29.9 is considered overweight.  A BMI of 30 and above is considered obese.  Maintain normal blood lipids and cholesterol by exercising and minimizing your intake of saturated fat. Eat a balanced diet with plenty of fruits and vegetables. Blood tests for lipids and cholesterol should begin at age 23 and be repeated every 5 years. If your lipid or cholesterol levels are high, you are over age 34, or you are at high risk for heart disease, you may need your cholesterol levels checked more frequently.Ongoing high lipid and cholesterol levels should be treated with medicines if diet and exercise are not working.  If you smoke, find out from your health care  provider how to quit. If you do not use tobacco, do not start.  Lung cancer screening is recommended for adults aged 61-80 years who are at high risk for developing lung cancer because of a history of smoking. A yearly low-dose CT scan of the lungs is recommended for people who have at least a 30-pack-year history of smoking and are current smokers or have quit within the past 15 years. A pack year of smoking is smoking an average of 1 pack of cigarettes a day for 1 year (for example, a 30-pack-year history of smoking could mean smoking 1 pack a day for 30 years or 2 packs a day for 15 years). Yearly screening should continue until the smoker has stopped smoking for at least 15 years. Yearly screening should be stopped for people who develop a health problem that would prevent them from having lung cancer treatment.  If you choose to drink alcohol, do not have more than 2 drinks per day. One drink is considered to be 12 oz (360 mL) of beer, 5 oz (150 mL) of wine, or 1.5 oz (45 mL) of liquor.  Avoid the use of street drugs. Do not share needles with anyone. Ask for help if you need support or instructions about stopping the use of drugs.  High blood pressure causes heart disease and increases the risk of stroke. High blood pressure is more likely to develop in:  People who have blood pressure in the end of the normal range (100-139/85-89 mm Hg).  People  who are overweight or obese.  People who are African American.  If you are 46-77 years of age, have your blood pressure checked every 3-5 years. If you are 3 years of age or older, have your blood pressure checked every year. You should have your blood pressure measured twice--once when you are at a hospital or clinic, and once when you are not at a hospital or clinic. Record the average of the two measurements. To check your blood pressure when you are not at a hospital or clinic, you can use:  An automated blood pressure machine at a  pharmacy.  A home blood pressure monitor.  If you are 55-61 years old, ask your health care provider if you should take aspirin to prevent heart disease.  Diabetes screening involves taking a blood sample to check your fasting blood sugar level. This should be done once every 3 years after age 27 if you are at a normal weight and without risk factors for diabetes. Testing should be considered at a younger age or be carried out more frequently if you are overweight and have at least 1 risk factor for diabetes.  Colorectal cancer can be detected and often prevented. Most routine colorectal cancer screening begins at the age of 49 and continues through age 70. However, your health care provider may recommend screening at an earlier age if you have risk factors for colon cancer. On a yearly basis, your health care provider may provide home test kits to check for hidden blood in the stool. A small camera at the end of a tube may be used to directly examine the colon (sigmoidoscopy or colonoscopy) to detect the earliest forms of colorectal cancer. Talk to your health care provider about this at age 67 when routine screening begins. A direct exam of the colon should be repeated every 5-10 years through age 40, unless early forms of precancerous polyps or small growths are found.  People who are at an increased risk for hepatitis B should be screened for this virus. You are considered at high risk for hepatitis B if:  You were born in a country where hepatitis B occurs often. Talk with your health care provider about which countries are considered high risk.  Your parents were born in a high-risk country and you have not received a shot to protect against hepatitis B (hepatitis B vaccine).  You have HIV or AIDS.  You use needles to inject street drugs.  You live with, or have sex with, someone who has hepatitis B.  You are a man who has sex with other men (MSM).  You get hemodialysis  treatment.  You take certain medicines for conditions like cancer, organ transplantation, and autoimmune conditions.  Hepatitis C blood testing is recommended for all people born from [birth year] through 1963 and any individual with known risk factors for hepatitis C.  Healthy men should no longer receive prostate-specific antigen (PSA) blood tests as part of routine cancer screening. Talk to your health care provider about prostate cancer screening.  Testicular cancer screening is not recommended for adolescents or adult males who have no symptoms. Screening includes self-exam, a health care provider exam, and other screening tests. Consult with your health care provider about any symptoms you have or any concerns you have about testicular cancer.  Practice safe sex. Use condoms and avoid high-risk sexual practices to reduce the spread of sexually transmitted infections (STIs).  You should be screened for STIs, including gonorrhea and chlamydia if:  You are sexually active and are younger than 24 years.  You are older than 24 years, and your health care provider tells you that you are at risk for this type of infection.  Your sexual activity has changed since you were last screened, and you are at an increased risk for chlamydia or gonorrhea. Ask your health care provider if you are at risk.  If you are at risk of being infected with HIV, it is recommended that you take a prescription medicine daily to prevent HIV infection. This is called pre-exposure prophylaxis (PrEP). You are considered at risk if:  You are a man who has sex with other men (MSM).  You are a heterosexual man who is sexually active with multiple partners.  You take drugs by injection.  You are sexually active with a partner who has HIV.  Talk with your health care provider about whether you are at high risk of being infected with HIV. If you choose to begin PrEP, you should first be tested for HIV. You should then be tested  every 3 months for as long as you are taking PrEP.  Use sunscreen. Apply sunscreen liberally and repeatedly throughout the day. You should seek shade when your shadow is shorter than you. Protect yourself by wearing long sleeves, pants, a wide-brimmed hat, and sunglasses year round whenever you are outdoors.  Tell your health care provider of new moles or changes in moles, especially if there is a change in shape or color. Also, tell your health care provider if a mole is larger than the size of a pencil eraser.  A one-time screening for abdominal aortic aneurysm (AAA) and surgical repair of large AAAs by ultrasound is recommended for men aged 44-75 years who are current or former smokers.  Stay current with your vaccines (immunizations).   This information is not intended to replace advice given to you by your health care provider. Make sure you discuss any questions you have with your health care provider.   Document Released: 07/09/2007 Document Revised: 01/31/2014 Document Reviewed: 06/07/2010 Elsevier Interactive Patient Education [employer].

## 2015-09-11 NOTE — Progress Notes (Signed)
Pre visit review using our clinic review tool, if applicable. No additional management support is needed unless otherwise documented below in the visit note. 

## 2015-09-11 NOTE — Assessment & Plan Note (Signed)
Immunizations are up to date. Aged out of colonoscopy. They like to get flu shot in October. Counseled on home safety and exercise. Given 10 year screening recommendations.

## 2015-09-11 NOTE — Assessment & Plan Note (Signed)
History of vascular complication with stroke. Taking metformin BID now and last HgA1c at goal 7.3. Foot exam done and normal.

## 2015-09-11 NOTE — Progress Notes (Signed)
   Subjective:    Patient ID: Chad [name], male    DOB: [birth year], 82 y.o.   MRN: [id number]  HPI Here for medicare wellness, no new complaints. Please see A/P for status and treatment of chronic medical problems.   Diet: DM since diabetic Physical activity: sedentary, walking some now with PT Depression/mood screen: negative Hearing: intact to whispered voice with hearing aids Visual acuity: grossly normal, performs annual eye exam  ADLs: capable with assistance Fall risk: low Home safety: fair Cognitive evaluation: moderate to severe dementia EOL planning: adv directives discussed, in place  I have personally reviewed and have noted 1. The patient's medical and social history - reviewed today no changes 2. Their use of alcohol, tobacco or illicit drugs 3. Their current medications and supplements 4. The patient's functional ability including ADL's, fall risks, home safety risks and hearing or visual impairment. 5. Diet and physical activities 6. Evidence for depression or mood disorders 7. Care team reviewed and updated (available in snapshot)  Review of Systems  Unable to perform ROS: Dementia  Constitutional: Positive for activity change. Negative for appetite change, chills, fatigue and unexpected weight change.  HENT: Positive for hearing loss. Negative for congestion, postnasal drip, rhinorrhea, sinus pressure and trouble swallowing.   Respiratory: Negative for chest tightness, shortness of breath and wheezing.   Cardiovascular: Negative for chest pain, palpitations and leg swelling.  Gastrointestinal: Negative for abdominal distention, abdominal pain, blood in stool, constipation, diarrhea, nausea and vomiting.  Skin: Negative.   Neurological: Positive for speech difficulty. Negative for seizures, weakness and numbness.      Objective:   Physical Exam  Constitutional: He appears well-developed and well-nourished.  HENT:  Head: Normocephalic and atraumatic.    Eyes: EOM are normal.  Neck: Normal range of motion.  Cardiovascular: Normal rate and regular rhythm.   Pulmonary/Chest: Effort normal and breath sounds normal. No respiratory distress. He has no wheezes. He has no rales.  Abdominal: Soft. Bowel sounds are normal. He exhibits no distension. There is no tenderness. There is no rebound.  Neurological: Coordination normal.  Hearing is poor at baseline, able to follow 2 step directions. No walking assistance device today  Skin: Skin is warm and dry.   Vitals:   09/11/15 1309  BP: 130/70  Pulse: 63  Resp: 14  Temp: 98.7 F (37.1 C)  TempSrc: Oral  SpO2: 96%  Weight: 165 lb (74.8 kg)  Height: 5\' 9"  (1.753 m)      Assessment & Plan:

## 2015-10-05 ENCOUNTER — Other Ambulatory Visit: Payer: Self-pay | Admitting: Internal Medicine

## 2015-10-13 ENCOUNTER — Other Ambulatory Visit: Payer: Self-pay | Admitting: Internal Medicine

## 2015-10-13 ENCOUNTER — Ambulatory Visit (INDEPENDENT_AMBULATORY_CARE_PROVIDER_SITE_OTHER): Payer: Medicare HMO | Admitting: Internal Medicine

## 2015-10-13 ENCOUNTER — Encounter: Payer: Self-pay | Admitting: Internal Medicine

## 2015-10-13 VITALS — BP 124/60 | HR 66 | Ht 69.0 in | Wt 166.0 lb

## 2015-10-13 DIAGNOSIS — J449 Chronic obstructive pulmonary disease, unspecified: Secondary | ICD-10-CM

## 2015-10-13 DIAGNOSIS — Z23 Encounter for immunization: Secondary | ICD-10-CM | POA: Diagnosis not present

## 2015-10-13 NOTE — Patient Instructions (Addendum)
Plan A = Automatic = symbicort 160 Take 2 puffs first thing in am and then another 2 puffs about 12 hours later.    Plan B = Backup Only use your albuterol (proventil) s a rescue medication to be used if you can't catch your breath by resting or doing a relaxed purse lip breathing pattern.  - The less you use it, the better it will work when you need it. - Ok to use the inhaler up to 2 puffs  every 4 hours if you must but call for appointment if use goes up over your usual need - Don't leave home without it !!  (think of it like the spare tire for your car)   Plan C = Crisis - only use your albuterol nebulizer if you first try Plan B and it fails to help > ok to use the nebulizer up to every 4 hours but if start needing it regularly call for immediate appointment   Plan D = Doctor - call me if B and C not adequate  Plan E = ER - go to ER or call 911 if all else fails     If you are satisfied with your treatment plan,  let your doctor know and he/she can either refill your medications or you can return here when your prescription runs out.     If in any way you are not 100% satisfied,  please tell us.  If 100% better, tell your friends!  Pulmonary follow up is as needed

## 2015-10-13 NOTE — Progress Notes (Signed)
Subjective:    Patient ID: Chad [name], male    DOB: Dec 16, 1929    MRN: [id number]  Brief patient profile:  85 yowm quit smoking x 1980 with GOLD II copd by pfts 06/10/08 and freq exac which improve transiently on steroids   History of Present Illness  07/02/2014  Acute ov/Chad [name] re: aecopd/ ab plus ? Pseudoasthma  Chief Complaint  Patient presents with  . Acute Visit    Pt c/o wheezing and cough x 5 days. Cough is non prod. He has also started to have some nasal congestion over the past couple of days. He is using albuterol inhaler approx 3 x per day and has not had to use neb at all.   gradually worse cough/ subj wheeze since last prednisone /win a week or two of ov.  Worse day than noct/ comfortable at rest p saba but use way up over baseline rec Omeprazole Take 30- 60 min before your first and last meals of the day GERD diet  Stop powdered inhalers = spiriva / foradil and start symbicort 160 Take 2 puffs first thing in am and then another 2 puffs about 12 hours later.  Work on inhaler technique:   Only use your albuterol as a rescue medication Only use albuterol nebulizer up to every 4 hours if needed but goal is not need it all  Prednisone 10 mg take  4 each am x 2 days,   2 each am x 2 days,  1 each am x 2 days and stop     01/30/2015 acute extended ov/Erika Hussar re: recurrent cough in pt with COPD II  Chief Complaint  Patient presents with  . Acute Visit    Coughing up greenish/yellow mucus. Chest congestion, nasal congestion. Has taken 2 boxes of Mucinex and 1 bottle of liquid Mucinex already.   was better p prev ov then Onset mid November 2016 acute  assoc with stuffy nose  Has flutter not using / poor insight into prns  Cough only  better after hyrdocodone/ no better p prednisone hfa poor  rec When sick with flare of cough/ congestion  mucinex dm 1200 mg every 12 hours and use the flutter as much as possible If having bad cough/ congestion in am > use nebulizer albuterol  first thing in am  Augmentin 875 mg take one pill twice daily  X 10 days - take at breakfast and supper with large glass of water.  It would help reduce the usual side effects (diarrhea and yeast infections) if you ate cultured yogurt at lunch.  Prednisone 10 mg take  4 each am x 2 days,   2 each am x 2 days,  1 each am x 2 days and stop  Work on inhaler technique:  relax and gently blow all the way out then take a nice smooth deep breath back in, triggering the inhaler at same time you start breathing in.  Hold for up to 5 seconds if you can. Blow out thru nose. Rinse and gargle with water when done Please remember to go to the  x-ray department downstairs for your tests - we will call you with the results when they are available. Please schedule a follow up office visit in 6 weeks, call sooner if needed      02/26/2015  f/u ov/Chad [name] re:  COPD II/ maint rx symb 160 2bid and prn saba Chief Complaint  Patient presents with  . Acute Visit    No fever as  of this AM. Denies any chills/sweats/nausea/vomiting. yesterday did cough up green phlem but non today. Had wheezing about 1 day ago.  was fine from 1/6 - 1/24 one episode early am sob > 911 > nl vitals, better s rx  Then 02/25/15 coughed up green for the first time    Changed prilosec to alternative rx 2/1 Rare perceived need for saba / not clear he used action plan  rec Augmentin 875 mg take one pill twice daily  X 10 days  Prilosec 20 mg Take 30- 60 min before your first and last meals of the day  For cough > mucinex dm 1200 up to twice daily and use the flutter as you can  Work on Product manager inhaler technique:        Admit date: 03/11/2015 Discharge date: 03/15/2015     Discharge Diagnoses:  Principal Problem:  Sepsis due to pneumonia [facility]) Active Problems:  HLD (hyperlipidemia)  Essential hypertension  COPD GOLD II   Diabetes mellitus without complication (HCC)  Moderate dementia with behavioral disturbance  Pneumonia   Sepsis ([place])  Acute kidney injury (HCC)> resolved    04/23/2015  f/u ov/Chad [name] re: GOLD II/ maint on symbicort 160 2bid  Chief Complaint  Patient presents with  . Follow-up    Breathing is doing well. No new co's today.   Not limited by breathing from desired activities  But very inactive   rec Please see patient coordinator before you leave today  to schedule ENT eval Dr [name] Try bevespi Take 2 puffs first thing in am and then another 2 puffs about 12 hours later.  Only use your albuterol as a rescue medication     05/25/2015  f/u ov/Chad [name] re: COPD  GOLD II/ maint rx bivespi   Chief Complaint  Patient presents with  . Follow-up    Breathing is overall doing well. Had some wheezing and cough 1 day ago-mucinex has helped.   rarely using any saba hfa/ no neb at all  Doe = MMRC1 = can walk nl pace, flat grade, can't hurry or go uphills or steps s sob   rec  Continue bevespi Take 2 puffs first thing in am and then another 2 puffs about 12 hours later if the [place] doesn't cover it then ok to change back to symbiocrt     06/01/2015 NP/ Acute OV  Pt presents for an acute office visit.  Complains of wheezing, SOB, prod cough with clear mucus, chest tightness/congestion starting on 05/29/15. Denies any sinus congestion/drainage, fever, nausea or vomiting. Called EMS last pm, was given Neb tx. Felt better and declined hospital transport.  Feels some better this am but coughing up thick mucus, and having wheezing .  CXR 5/1 w/ COPD changes, nad.  rec Augmentin 875mg  Twice daily  , take w/ food.  Prednisone taper over next week.  Mucinex DM Twice daily  As needed  Cough/congestion .    5/515/17 ST rec Diet recommendations: Regular;Thin liquid Liquids provided via: Cup   07/13/2015 extended post hosp f/u ov/transition of care/Chad [name] re:  GOLD  II copd/ symbicort 160 2bid Chief Complaint  Patient presents with  . Follow-up    Breathing has improved.   d/c from [facility] x Jun 17 2015 took  another round of augmentin /pred  Cough/congestion Improved mobility since then using  rollator and doing doing better walking hallways and still saba use daily but only p activity/ not noct and no neb (close to baseline) rec [employer] is  a better inhaler than proventil because it's got a counter to keep up with how much is left If starts needing the nebulizer more than usual we need to see him here right away but if it's up to every 4 hours then should go straight to ER     10/13/2015  f/u ov/Chad [name] re:  GOLD II copd/ symibicort 160 2bid Chief Complaint  Patient presents with  . Follow-up    Breathing is overall doing well. No new co's today.    [facility] very happy with progress Now doe = .MMRC3 = can't walk 100 yards even at a slow pace at a flat grade s stopping due to sob / no need for 02  And minimal need for saba in any form.    No obvious day to day or daytime variability or assoc excess/ purulent sputum or mucus plugs or hemoptysis or cp or chest tightness, subjective wheeze or overt sinus or hb symptoms. No unusual exp hx or h/o childhood pna/ asthma or knowledge of premature birth.  Sleeping ok without nocturnal  or early am exacerbation  of respiratory  c/o's or need for noct saba. Also denies any obvious fluctuation of symptoms with weather or environmental changes or other aggravating or alleviating factors except as outlined above   Current Medications, Allergies, Complete Past Medical History, Past Surgical History, Family History, and Social History were reviewed in [employer] record.  ROS  The following are not active complaints unless bolded sore throat, dysphagia, dental problems, itching, sneezing,  nasal congestion or excess/ purulent secretions, ear ache,   fever, chills, sweats, unintended wt loss, classically pleuritic or exertional cp,  orthopnea pnd or leg swelling, presyncope, palpitations, abdominal pain, anorexia,  nausea, vomiting, diarrhea  or change in bowel or bladder habits, change in stools or urine, dysuria,hematuria,  rash, arthralgias, visual complaints, headache, numbness, weakness or ataxia or problems with walking or coordination,  change in mood/affect or memory.                       Objective:   amb stoic wm  nad    02/26/2015  171 > 04/23/2015  169 > 05/25/2015   171> 07/13/2015   165 > 10/13/2015  166      Vital signs reviewed  - note sats 98% on Arrival RA    HEENT: nl dentition, turbinates, and orophanx. Nl external ear canals without cough reflex - edentulous    NECK :  without JVD/Nodes/TM/ nl carotid upstrokes bilaterally   LUNGS: no acc muscle use, very minimal insp and exp rhonchi bilaterally    CV:  RRR  no s3 or murmur or increase in P2, no edema   ABD:  Protuberant but soft and nontender with nl excursion in the supine position. No bruits or organomegaly, bowel sounds nl  MS:  warm without deformities, calf tenderness, cyanosis or clubbing  SKIN: warm and dry without lesions    NEURO:  alert, nad / thinks [place] president/ neg day of week         CXR PA and Lateral:   07/13/2015 :    I personally reviewed images and agree with radiology impression as follows:    The opacity in the retrocardiac region is due to a moderate to large hiatal hernia. There is also prominent cardiophrenic angle fat best seen on the lateral view, stable. There is no evidence of pneumonia or pulmonary edema. Lungs  are mildly hyperexpanded. No pleural effusion or pneumothorax.    Assessment & Plan:

## 2015-10-13 NOTE — Assessment & Plan Note (Addendum)
-   PFTs 06/10/08  FEV1 1.73 (61%) ratio 55 with ERV 232 and DLCO 87%  - 07/02/2014 try symbicort 160 2bid and stop all powders  - 04/23/2015  > try bevespi less hoarse,some better doe> [facility] covered symbicort  - 07/13/2015  After extensive coaching HFA effectiveness =   90%     Marked serial improvement in overall health on symbicort 160 2bid and restricted diet  I had an extended final summary discussion with the patient/wife  reviewing all relevant studies completed to date and  lasting 15 to 20 minutes of a 25 minute visit on the following issues:    I had an extended discussion with the patient reviewing all relevant studies completed to date and  lasting 15 to 20 minutes of a 25 minute visit    Each maintenance medication was reviewed in detail including most importantly the difference between maintenance and prns and under what circumstances the prns are to be triggered using an action plan format that is not reflected in the computer generated alphabetically organized AVS.    Please see instructions for details which were reviewed in writing and the patient given a copy highlighting the part that I personally wrote and discussed at today's ov.   Pulmonary f/u can be prn

## 2015-10-15 ENCOUNTER — Encounter: Payer: Self-pay | Admitting: Internal Medicine

## 2015-10-27 ENCOUNTER — Telehealth: Payer: Self-pay

## 2015-10-27 NOTE — Telephone Encounter (Signed)
-----   Message from Rosalin Hawking, MD sent at 10/26/2015  5:55 PM EDT ----- Could you please let the patient know that the 30 day heart monitoring test done recently was negative for condition called afib. No change of medication needed at this time. Please continue current treatment. Thanks.  Rosalin Hawking, MD PhD Stroke Neurology 10/26/2015 5:55 PM

## 2015-10-27 NOTE — Telephone Encounter (Signed)
LFt vm for patient to call back about 30 day cardiac monitor results

## 2015-10-28 NOTE — Telephone Encounter (Signed)
Rn call wife Patsy on [place] form. Rn stated pts 30 day cardiac monitor was negative for atrial fibrillation. No change in medication at this time. Please continue treatment plan. Pts wife verbalized understanding.

## 2015-10-28 NOTE — Telephone Encounter (Signed)
Pt's wife called back to discuss results.

## 2015-10-30 ENCOUNTER — Other Ambulatory Visit: Payer: Self-pay | Admitting: Internal Medicine

## 2015-10-30 ENCOUNTER — Ambulatory Visit (INDEPENDENT_AMBULATORY_CARE_PROVIDER_SITE_OTHER): Payer: Medicare HMO | Admitting: Internal Medicine

## 2015-10-30 ENCOUNTER — Encounter: Payer: Self-pay | Admitting: Internal Medicine

## 2015-10-30 ENCOUNTER — Ambulatory Visit (INDEPENDENT_AMBULATORY_CARE_PROVIDER_SITE_OTHER)
Admission: RE | Admit: 2015-10-30 | Discharge: 2015-10-30 | Disposition: A | Discharge status: Home / Self Care | Payer: Medicare HMO | Source: Ambulatory Visit | Attending: Internal Medicine | Admitting: Internal Medicine

## 2015-10-30 VITALS — BP 138/60 | HR 78 | Ht 69.0 in | Wt 166.0 lb

## 2015-10-30 DIAGNOSIS — J449 Chronic obstructive pulmonary disease, unspecified: Secondary | ICD-10-CM

## 2015-10-30 MED ORDER — AMOXICILLIN-POT CLAVULANATE 875-125 MG PO TABS: 1.0000 | ORAL_TABLET | Freq: Two times a day (BID) | ORAL | 0 refills | Status: AC

## 2015-10-30 MED ORDER — PREDNISONE 10 MG PO TABS: ORAL_TABLET | ORAL | 0 refills | Status: DC

## 2015-10-30 NOTE — Patient Instructions (Addendum)
Augmentin 875 mg take one pill twice daily  X 10 days - take at breakfast and supper with large glass of water.  It would help reduce the usual side effects (diarrhea and yeast infections) if you ate cultured yogurt at lunch.   Prednisone 10 mg take  4 each am x 2 days,   2 each am x 2 days,  1 each am x 2 days and stop  For cough > mucinex dm 1200 up to twice daily and use the flutter as you can   Work on perfecting inhaler technique:  relax and gently blow all the way out then take a nice smooth deep breath back in, triggering the inhaler at same time you start breathing in.  Hold for up to 5 seconds if you can. Blow out thru nose. Rinse and gargle with water when done  See Tammy NP in 4 weeks with all your medications, even over the counter meds, separated in two separate bags, the ones you take no matter what vs the ones you stop once you feel better and take only as needed when you feel you need them.   Tammy  will generate for you a new user friendly medication calendar that will put [place] all on the same page re: your medication use.     Without this process, it simply isn't possible to assure that we are providing  your outpatient care  with  the attention to detail we feel you deserve.   If we cannot assure that you're getting that kind of care,  then we cannot manage your problem effectively from this clinic.  Once you have seen Tammy and we are sure that we're all on the same page with your medication use she will arrange follow up with me.  Late add : confirm hfa and if not effective consider laba/ics neb

## 2015-10-30 NOTE — Progress Notes (Signed)
Subjective:    Patient ID: Chad [name], male    DOB: September 17, 1929    MRN: [id number]  Brief patient profile:  85 yowm quit smoking x 1980 with GOLD II copd by pfts 06/10/08 and freq exac which improve transiently on steroids   History of Present Illness  07/02/2014  Acute ov/Chad [name] re: aecopd/ ab plus ? Pseudoasthma  Chief Complaint  Patient presents with  . Acute Visit    Pt c/o wheezing and cough x 5 days. Cough is non prod. He has also started to have some nasal congestion over the past couple of days. He is using albuterol inhaler approx 3 x per day and has not had to use neb at all.   gradually worse cough/ subj wheeze since last prednisone /win a week or two of ov.  Worse day than noct/ comfortable at rest p saba but use way up over baseline rec Omeprazole Take 30- 60 min before your first and last meals of the day GERD diet  Stop powdered inhalers = spiriva / foradil and start symbicort 160 Take 2 puffs first thing in am and then another 2 puffs about 12 hours later.  Work on inhaler technique:   Only use your albuterol as a rescue medication Only use albuterol nebulizer up to every 4 hours if needed but goal is not need it all  Prednisone 10 mg take  4 each am x 2 days,   2 each am x 2 days,  1 each am x 2 days and stop     01/30/2015 acute extended ov/Daelyn Mozer re: recurrent cough in pt with COPD II  Chief Complaint  Patient presents with  . Acute Visit    Coughing up greenish/yellow mucus. Chest congestion, nasal congestion. Has taken 2 boxes of Mucinex and 1 bottle of liquid Mucinex already.   was better p prev ov then Onset mid November 2016 acute  assoc with stuffy nose  Has flutter not using / poor insight into prns  Cough only  better after hyrdocodone/ no better p prednisone hfa poor  rec When sick with flare of cough/ congestion  mucinex dm 1200 mg every 12 hours and use the flutter as much as possible If having bad cough/ congestion in am > use nebulizer albuterol  first thing in am  Augmentin 875 mg take one pill twice daily  X 10 days - take at breakfast and supper with large glass of water.  It would help reduce the usual side effects (diarrhea and yeast infections) if you ate cultured yogurt at lunch.  Prednisone 10 mg take  4 each am x 2 days,   2 each am x 2 days,  1 each am x 2 days and stop  Work on inhaler technique:  relax and gently blow all the way out then take a nice smooth deep breath back in, triggering the inhaler at same time you start breathing in.  Hold for up to 5 seconds if you can. Blow out thru nose. Rinse and gargle with water when done Please remember to go to the  x-ray department downstairs for your tests - we will call you with the results when they are available. Please schedule a follow up office visit in 6 weeks, call sooner if needed      02/26/2015  f/u ov/Chad [name] re:  COPD II/ maint rx symb 160 2bid and prn saba Chief Complaint  Patient presents with  . Acute Visit    No fever as  of this AM. Denies any chills/sweats/nausea/vomiting. yesterday did cough up green phlem but non today. Had wheezing about 1 day ago.  was fine from 1/6 - 1/24 one episode early am sob > 911 > nl vitals, better s rx  Then 02/25/15 coughed up green for the first time    Changed prilosec to alternative rx 2/1 Rare perceived need for saba / not clear he used action plan  rec Augmentin 875 mg take one pill twice daily  X 10 days  Prilosec 20 mg Take 30- 60 min before your first and last meals of the day  For cough > mucinex dm 1200 up to twice daily and use the flutter as you can  Work on Product manager inhaler technique:        Admit date: 03/11/2015 Discharge date: 03/15/2015     Discharge Diagnoses:  Principal Problem:  Sepsis due to pneumonia [facility]) Active Problems:  HLD (hyperlipidemia)  Essential hypertension  COPD GOLD II   Diabetes mellitus without complication (HCC)  Moderate dementia with behavioral disturbance  Pneumonia   Sepsis ([place])  Acute kidney injury (HCC)> resolved    04/23/2015  f/u ov/Chad [name] re: GOLD II/ maint on symbicort 160 2bid  Chief Complaint  Patient presents with  . Follow-up    Breathing is doing well. No new co's today.   Not limited by breathing from desired activities  But very inactive   rec Please see patient coordinator before you leave today  to schedule ENT eval Dr [name] Try bevespi Take 2 puffs first thing in am and then another 2 puffs about 12 hours later.  Only use your albuterol as a rescue medication     05/25/2015  f/u ov/Chad [name] re: COPD  GOLD II/ maint rx bivespi   Chief Complaint  Patient presents with  . Follow-up    Breathing is overall doing well. Had some wheezing and cough 1 day ago-mucinex has helped.   rarely using any saba hfa/ no neb at all  Doe = MMRC1 = can walk nl pace, flat grade, can't hurry or go uphills or steps s sob   rec  Continue bevespi Take 2 puffs first thing in am and then another 2 puffs about 12 hours later if the [place] doesn't cover it then ok to change back to symbiocrt     06/01/2015 NP/ Acute OV  Pt presents for an acute office visit.  Complains of wheezing, SOB, prod cough with clear mucus, chest tightness/congestion starting on 05/29/15. Denies any sinus congestion/drainage, fever, nausea or vomiting. Called EMS last pm, was given Neb tx. Felt better and declined hospital transport.  Feels some better this am but coughing up thick mucus, and having wheezing .  CXR 5/1 w/ COPD changes, nad.  rec Augmentin 875mg  Twice daily  , take w/ food.  Prednisone taper over next week.  Mucinex DM Twice daily  As needed  Cough/congestion .    06/08/15 ST rec Diet recommendations: Regular;Thin liquid Liquids provided via: Cup   07/13/2015 extended post hosp f/u ov/transition of care/Chad [name] re:  GOLD  II copd/ symbicort 160 2bid Chief Complaint  Patient presents with  . Follow-up    Breathing has improved.   d/c from [facility] x Jun 17 2015 took  another round of augmentin /pred  Cough/congestion Improved mobility since then using  rollator and doing doing better walking hallways and still saba use daily but only p activity/ not noct and no neb (close to baseline) rec [employer] People is  a better inhaler than proventil because it's got a counter to keep up with how much is left If starts needing the nebulizer more than usual we need to see him here right away but if it's up to every 4 hours then should go straight to ER     10/13/2015  f/u ov/Chad [name] re:  GOLD II copd/ symibicort 160 2bid Chief Complaint  Patient presents with  . Follow-up    Breathing is overall doing well. No new co's today.    walking s [facility] very happy with progress Now doe = .MMRC3 = can't walk 100 yards even at a slow pace at a flat grade s stopping due to sob / no need for 02  And minimal need for saba in any form. rec Plan A = Automatic = symbicort 160 Take 2 puffs first thing in am and then another 2 puffs about 12 hours later.  Plan B = Backup Only use your albuterol (proventil) s a rescue medication  Plan C = Crisis - only use your albuterol nebulizer if you first try Plan B and it fails to help > ok to use the nebulizer up to every 4 hours but if start needing it regularly call for immediate appointment    10/30/2015 acute extended ov/Chad [name] re:  Aecpd / GOLD II maint rx symbicort 160 2 bid / ppi bid Chief Complaint  Patient presents with  . Acute Visit    Pt c/o cough with white sputum and nasal d/c for the past few days. He has also been wheezing some.    coughs more p eating and esp if  takes large bites      No obvious day to day or daytime variability or assocpurulent sputum or mucus plugs or hemoptysis or cp or chest tightness, subjective wheeze or overt   hb symptoms. No unusual exp hx or h/o childhood pna/ asthma or knowledge of premature birth.  Sleeping ok without nocturnal  or early am exacerbation  of respiratory  c/o's or need  for noct saba. Also denies any obvious fluctuation of symptoms with weather or environmental changes or other aggravating or alleviating factors except as outlined above   Current Medications, Allergies, Complete Past Medical History, Past Surgical History, Family History, and Social History were reviewed in [employer] record.  ROS  The following are not active complaints unless bolded sore throat, dysphagia, dental problems, itching, sneezing,  nasal congestion or excess/ purulent secretions, ear ache,   fever, chills, sweats, unintended wt loss, classically pleuritic or exertional cp,  orthopnea pnd or leg swelling, presyncope, palpitations, abdominal pain, anorexia, nausea, vomiting, diarrhea  or change in bowel or bladder habits, change in stools or urine, dysuria,hematuria,  rash, arthralgias, visual complaints, headache, numbness, weakness or ataxia or problems with walking or coordination,  change in mood/affect or memory.                       Objective:   amb stoic wm  nad    02/26/2015  171 > 04/23/2015  169 > 05/25/2015   171> 07/13/2015   165 > 10/13/2015  166 >  10/30/2015 167      Vital signs reviewed  -  Note sats 95% on arrival RA   HEENT: nl dentition,  and orophanx. Nl external ear canals without cough reflex - edentulous  mp secretions L > R nostrils / severe turbinate swelling    NECK :  without JVD/Nodes/TM/ nl carotid upstrokes bilaterally   LUNGS: no acc muscle use -  insp and exp rhonchi bilaterally    CV:  RRR  no s3 or murmur or increase in P2, no edema   ABD:  Protuberant but soft and nontender with nl excursion in the supine position. No bruits or organomegaly, bowel sounds nl  MS:  warm without deformities, calf tenderness, cyanosis or clubbing  SKIN: warm and dry without lesions    NEURO:  alert, nad / thinks [place] president/ neg day of week          CXR PA and Lateral:   10/30/2015 :    I personally reviewed images and  agree with radiology impression as follows:    No appreciable change from prior study. Scarring left base. No edema or consolidation. Stable cardiac silhouette. Sizable hiatal hernia. Aortic atherosclerosis.    Assessment & Plan:

## 2015-10-30 NOTE — Progress Notes (Signed)
Spoke with pt and notified of results per Dr. Wert. Pt verbalized understanding and denied any questions. 

## 2015-10-31 ENCOUNTER — Other Ambulatory Visit: Payer: Self-pay | Admitting: Internal Medicine

## 2015-10-31 NOTE — Assessment & Plan Note (Signed)
-   PFTs 06/10/08  FEV1 1.73 (61%) ratio 55 with ERV 232 and DLCO 87%  - 07/02/2014 try symbicort 160 2bid and stop all powders  - 04/23/2015  > try bevespi less hoarse,some better doe> [facility] covered symbicort  - 10/30/2015  After extensive coaching HFA effectiveness =  50%    Has lost ground since last ov and now symptoms again difficult to control.  DDX of  difficult airways management almost all start with A and  include Adherence, Ace Inhibitors, Acid Reflux, Active Sinus Disease, Alpha 1 Antitripsin deficiency, Anxiety masquerading as Airways dz,  ABPA,  Allergy(esp in young), Aspiration (esp in elderly), Adverse effects of meds,  Active smokers, A bunch of PE's (a small clot burden can't cause this syndrome unless there is already severe underlying pulm or vascular dz with poor reserve) plus two Bs  = Bronchiectasis and Beta blocker use..and one C= CHF   Adherence is always the initial "prime suspect" and is a multilayered concern that requires a "trust but verify" approach in every patient - starting with knowing how to use medications, especially inhalers, correctly, keeping up with refills and understanding the fundamental difference between maintenance and prns vs those medications only taken for a very short course and then stopped and not refilled.  - see hfa above, if not able to use hfa options are dpi vs nab/ favor latter  -  To keep things simple, I have asked the patient to first separate medicines that are perceived as maintenance, that is to be taken daily "no matter what", from those medicines that are taken on only on an as-needed basis and I have given the patient examples of both, and then return to see our NP to generate a  detailed  medication calendar which should be followed until the next physician sees the patient and updates it.    ? Acid (or non-acid) GERD > always difficult to exclude as up to 75% of pts in some series report no assoc GI/ Heartburn symptoms and he has a large HH>  rec continue max (24h)  acid suppression and diet restrictions/ reviewed     ? Aspiration / asp pna > see last ST eval 06/08/15 > encouraged to eat smaller bites   ? Active sinus dz > Augmentin 875 mg take one pill twice daily  X 10 days     I had an extended discussion with the patient reviewing all relevant studies completed to date and  lasting 15 to 20 minutes of a 25 minute visit    Each maintenance medication was reviewed in detail including most importantly the difference between maintenance and prns and under what circumstances the prns are to be triggered using an action plan format that is not reflected in the computer generated alphabetically organized AVS.    Please see instructions for details which were reviewed in writing and the patient given a copy highlighting the part that I personally wrote and discussed at today's ov.

## 2015-11-03 ENCOUNTER — Other Ambulatory Visit: Payer: Self-pay | Admitting: Internal Medicine

## 2015-11-23 ENCOUNTER — Ambulatory Visit: Payer: Medicare HMO | Admitting: Neurology

## 2015-11-26 ENCOUNTER — Encounter: Payer: Self-pay | Admitting: Neurology

## 2015-11-27 ENCOUNTER — Telehealth: Payer: Self-pay | Admitting: Internal Medicine

## 2015-11-27 ENCOUNTER — Emergency Department (HOSPITAL_COMMUNITY): Payer: Medicare HMO

## 2015-11-27 ENCOUNTER — Observation Stay (HOSPITAL_COMMUNITY)
Admission: EM | Admit: 2015-11-27 | Discharge: 2015-11-28 | Disposition: A | Discharge status: Home / Self Care | Payer: Medicare HMO | Attending: Family Medicine | Admitting: Family Medicine

## 2015-11-27 ENCOUNTER — Encounter (HOSPITAL_COMMUNITY): Payer: Self-pay | Admitting: Nurse Practitioner

## 2015-11-27 ENCOUNTER — Observation Stay (HOSPITAL_COMMUNITY): Payer: Medicare HMO

## 2015-11-27 DIAGNOSIS — E119 Type 2 diabetes mellitus without complications: Secondary | ICD-10-CM

## 2015-11-27 DIAGNOSIS — Z85828 Personal history of other malignant neoplasm of skin: Secondary | ICD-10-CM | POA: Insufficient documentation

## 2015-11-27 DIAGNOSIS — E785 Hyperlipidemia, unspecified: Secondary | ICD-10-CM | POA: Diagnosis present

## 2015-11-27 DIAGNOSIS — F0391 Unspecified dementia with behavioral disturbance: Secondary | ICD-10-CM | POA: Diagnosis not present

## 2015-11-27 DIAGNOSIS — Z7902 Long term (current) use of antithrombotics/antiplatelets: Secondary | ICD-10-CM

## 2015-11-27 DIAGNOSIS — I2510 Atherosclerotic heart disease of native coronary artery without angina pectoris: Secondary | ICD-10-CM | POA: Diagnosis not present

## 2015-11-27 DIAGNOSIS — E7800 Pure hypercholesterolemia, unspecified: Secondary | ICD-10-CM | POA: Diagnosis not present

## 2015-11-27 DIAGNOSIS — F17210 Nicotine dependence, cigarettes, uncomplicated: Secondary | ICD-10-CM

## 2015-11-27 DIAGNOSIS — I6523 Occlusion and stenosis of bilateral carotid arteries: Secondary | ICD-10-CM | POA: Insufficient documentation

## 2015-11-27 DIAGNOSIS — Z79899 Other long term (current) drug therapy: Secondary | ICD-10-CM

## 2015-11-27 DIAGNOSIS — K219 Gastro-esophageal reflux disease without esophagitis: Secondary | ICD-10-CM | POA: Diagnosis present

## 2015-11-27 DIAGNOSIS — Z87891 Personal history of nicotine dependence: Secondary | ICD-10-CM | POA: Insufficient documentation

## 2015-11-27 DIAGNOSIS — Z801 Family history of malignant neoplasm of trachea, bronchus and lung: Secondary | ICD-10-CM

## 2015-11-27 DIAGNOSIS — Z7984 Long term (current) use of oral hypoglycemic drugs: Secondary | ICD-10-CM | POA: Diagnosis not present

## 2015-11-27 DIAGNOSIS — Z800 Family history of malignant neoplasm of digestive organs: Secondary | ICD-10-CM

## 2015-11-27 DIAGNOSIS — Z8249 Family history of ischemic heart disease and other diseases of the circulatory system: Secondary | ICD-10-CM

## 2015-11-27 DIAGNOSIS — F03B18 Unspecified dementia, moderate, with other behavioral disturbance: Secondary | ICD-10-CM | POA: Diagnosis present

## 2015-11-27 DIAGNOSIS — I69354 Hemiplegia and hemiparesis following cerebral infarction affecting left non-dominant side: Secondary | ICD-10-CM | POA: Diagnosis not present

## 2015-11-27 DIAGNOSIS — R531 Weakness: Secondary | ICD-10-CM

## 2015-11-27 DIAGNOSIS — J189 Pneumonia, unspecified organism: Secondary | ICD-10-CM | POA: Diagnosis present

## 2015-11-27 DIAGNOSIS — I10 Essential (primary) hypertension: Secondary | ICD-10-CM | POA: Diagnosis not present

## 2015-11-27 DIAGNOSIS — E118 Type 2 diabetes mellitus with unspecified complications: Secondary | ICD-10-CM | POA: Diagnosis present

## 2015-11-27 DIAGNOSIS — Z882 Allergy status to sulfonamides status: Secondary | ICD-10-CM

## 2015-11-27 DIAGNOSIS — H548 Legal blindness, as defined in USA: Secondary | ICD-10-CM | POA: Insufficient documentation

## 2015-11-27 DIAGNOSIS — H409 Unspecified glaucoma: Secondary | ICD-10-CM | POA: Diagnosis not present

## 2015-11-27 DIAGNOSIS — Z888 Allergy status to other drugs, medicaments and biological substances status: Secondary | ICD-10-CM

## 2015-11-27 DIAGNOSIS — Z8673 Personal history of transient ischemic attack (TIA), and cerebral infarction without residual deficits: Secondary | ICD-10-CM | POA: Diagnosis present

## 2015-11-27 DIAGNOSIS — J440 Chronic obstructive pulmonary disease with acute lower respiratory infection: Principal | ICD-10-CM | POA: Insufficient documentation

## 2015-11-27 DIAGNOSIS — Z881 Allergy status to other antibiotic agents status: Secondary | ICD-10-CM

## 2015-11-27 LAB — CBC
HCT: 40.5 % (ref 39.0–52.0)
HEMOGLOBIN: 13.1 g/dL (ref 13.0–17.0)
MCH: 27.6 pg (ref 26.0–34.0)
MCHC: 32.3 g/dL (ref 30.0–36.0)
MCV: 85.4 fL (ref 78.0–100.0)
Platelets: 204 10*3/uL (ref 150–400)
RBC: 4.74 MIL/uL (ref 4.22–5.81)
RDW: 16.4 % — ABNORMAL HIGH (ref 11.5–15.5)
WBC: 12.7 10*3/uL — ABNORMAL HIGH (ref 4.0–10.5)

## 2015-11-27 LAB — GLUCOSE, CAPILLARY: GLUCOSE-CAPILLARY: 198 mg/dL — AB (ref 65–99)

## 2015-11-27 LAB — COMPREHENSIVE METABOLIC PANEL
ALBUMIN: 3.7 g/dL (ref 3.5–5.0)
ALK PHOS: 67 U/L (ref 38–126)
ALT: 18 U/L (ref 17–63)
AST: 19 U/L (ref 15–41)
Anion gap: 13 (ref 5–15)
BILIRUBIN TOTAL: 0.6 mg/dL (ref 0.3–1.2)
BUN: 20 mg/dL (ref 6–20)
CO2: 21 mmol/L — ABNORMAL LOW (ref 22–32)
Calcium: 10 mg/dL (ref 8.9–10.3)
Chloride: 103 mmol/L (ref 101–111)
Creatinine, Ser: 1.21 mg/dL (ref 0.61–1.24)
GFR calc Af Amer: >60 mL/min (ref >60)
GFR calc non Af Amer: 53 mL/min — ABNORMAL LOW (ref >60)
GLUCOSE: 138 mg/dL — AB (ref 65–99)
POTASSIUM: 4.3 mmol/L (ref 3.5–5.1)
Sodium: 137 mmol/L (ref 135–145)
TOTAL PROTEIN: 6.9 g/dL (ref 6.5–8.1)

## 2015-11-27 LAB — URINALYSIS, ROUTINE W REFLEX MICROSCOPIC
Bilirubin Urine: NEGATIVE
GLUCOSE, UA: NEGATIVE mg/dL
HGB URINE DIPSTICK: NEGATIVE
Ketones, ur: NEGATIVE mg/dL
Leukocytes, UA: NEGATIVE
Nitrite: NEGATIVE
PROTEIN: NEGATIVE mg/dL
SPECIFIC GRAVITY, URINE: 1.02 (ref 1.005–1.030)
pH: 5.5 (ref 5.0–8.0)

## 2015-11-27 LAB — I-STAT CG4 LACTIC ACID, ED
Lactic Acid, Venous: 2.04 mmol/L (ref 0.5–1.9)
Lactic Acid, Venous: 1.57 mmol/L (ref 0.5–1.9)

## 2015-11-27 MED ORDER — INSULIN ASPART 100 UNIT/ML ~~LOC~~ SOLN
0.0000 [IU] | Freq: Three times a day (TID) | SUBCUTANEOUS | Status: DC
  Administered 2015-11-28: 1 [IU] via SUBCUTANEOUS

## 2015-11-27 MED ORDER — IBUPROFEN 400 MG PO TABS
600.0000 mg | ORAL_TABLET | Freq: Once | ORAL | Status: AC
  Administered 2015-11-27: 600 mg via ORAL
  Filled 2015-11-27: qty 1

## 2015-11-27 MED ORDER — SODIUM CHLORIDE 0.9 % IV BOLUS (SEPSIS)
1000.0000 mL | Freq: Once | INTRAVENOUS | Status: AC
  Administered 2015-11-27: 1000 mL via INTRAVENOUS

## 2015-11-27 MED ORDER — BRIMONIDINE TARTRATE 0.15 % OP SOLN
1.0000 [drp] | Freq: Two times a day (BID) | OPHTHALMIC | Status: DC
  Administered 2015-11-27 – 2015-11-28 (×2): 1 [drp] via OPHTHALMIC
  Filled 2015-11-27: qty 5

## 2015-11-27 MED ORDER — DEXTROSE 5 % IV SOLN
500.0000 mg | Freq: Once | INTRAVENOUS | Status: AC
  Administered 2015-11-27: 500 mg via INTRAVENOUS
  Filled 2015-11-27: qty 500

## 2015-11-27 MED ORDER — METFORMIN HCL 500 MG PO TABS: 500.0000 mg | ORAL_TABLET | Freq: Two times a day (BID) | ORAL | Status: DC

## 2015-11-27 MED ORDER — DEXTROSE 5 % IV SOLN
1.0000 g | Freq: Once | INTRAVENOUS | Status: AC
  Administered 2015-11-27: 1 g via INTRAVENOUS
  Filled 2015-11-27: qty 10

## 2015-11-27 MED ORDER — SODIUM CHLORIDE 0.9 % IV SOLN
INTRAVENOUS | Status: AC
  Administered 2015-11-27: 23:00:00 via INTRAVENOUS

## 2015-11-27 MED ORDER — INSULIN ASPART 100 UNIT/ML ~~LOC~~ SOLN: 0.0000 [IU] | Freq: Every day | SUBCUTANEOUS | Status: DC

## 2015-11-27 MED ORDER — MOMETASONE FURO-FORMOTEROL FUM 200-5 MCG/ACT IN AERO
2.0000 | INHALATION_SPRAY | Freq: Two times a day (BID) | RESPIRATORY_TRACT | Status: DC
  Administered 2015-11-28: 2 via RESPIRATORY_TRACT
  Filled 2015-11-27: qty 8.8

## 2015-11-27 MED ORDER — GUAIFENESIN ER 600 MG PO TB12
600.0000 mg | ORAL_TABLET | Freq: Two times a day (BID) | ORAL | Status: DC
  Administered 2015-11-27 – 2015-11-28 (×2): 600 mg via ORAL
  Filled 2015-11-27 (×2): qty 1

## 2015-11-27 MED ORDER — MIRTAZAPINE 15 MG PO TABS
15.0000 mg | ORAL_TABLET | Freq: Every day | ORAL | Status: DC
  Administered 2015-11-27: 15 mg via ORAL
  Filled 2015-11-27: qty 1

## 2015-11-27 MED ORDER — SIMVASTATIN 20 MG PO TABS
20.0000 mg | ORAL_TABLET | Freq: Every day | ORAL | Status: DC
  Administered 2015-11-27: 20 mg via ORAL
  Filled 2015-11-27: qty 1

## 2015-11-27 MED ORDER — ENOXAPARIN SODIUM 40 MG/0.4ML ~~LOC~~ SOLN
40.0000 mg | SUBCUTANEOUS | Status: DC
  Administered 2015-11-27: 40 mg via SUBCUTANEOUS
  Filled 2015-11-27: qty 0.4

## 2015-11-27 MED ORDER — PANTOPRAZOLE SODIUM 40 MG PO TBEC
40.0000 mg | DELAYED_RELEASE_TABLET | Freq: Every day | ORAL | Status: DC
  Administered 2015-11-28: 40 mg via ORAL
  Filled 2015-11-27: qty 1

## 2015-11-27 MED ORDER — LOSARTAN POTASSIUM 50 MG PO TABS: 100.0000 mg | ORAL_TABLET | Freq: Every day | ORAL | Status: DC

## 2015-11-27 MED ORDER — ALBUTEROL SULFATE (2.5 MG/3ML) 0.083% IN NEBU: 2.5000 mg | INHALATION_SOLUTION | RESPIRATORY_TRACT | Status: DC | PRN

## 2015-11-27 MED ORDER — DONEPEZIL HCL 10 MG PO TABS
10.0000 mg | ORAL_TABLET | Freq: Every day | ORAL | Status: DC
  Administered 2015-11-27: 10 mg via ORAL
  Filled 2015-11-27: qty 1

## 2015-11-27 MED ORDER — VITAMIN D 1000 UNITS PO TABS
1000.0000 [IU] | ORAL_TABLET | Freq: Every day | ORAL | Status: DC
  Administered 2015-11-28: 1000 [IU] via ORAL
  Filled 2015-11-27: qty 1

## 2015-11-27 MED ORDER — AZITHROMYCIN 500 MG IV SOLR
500.0000 mg | INTRAVENOUS | Status: DC
  Filled 2015-11-27: qty 500

## 2015-11-27 MED ORDER — CLOPIDOGREL BISULFATE 75 MG PO TABS
75.0000 mg | ORAL_TABLET | Freq: Every day | ORAL | Status: DC
  Administered 2015-11-28: 75 mg via ORAL
  Filled 2015-11-27: qty 1

## 2015-11-27 MED ORDER — AMLODIPINE BESYLATE 10 MG PO TABS: 10.0000 mg | ORAL_TABLET | Freq: Every day | ORAL | Status: DC

## 2015-11-27 MED ORDER — DEXTROSE 5 % IV SOLN
1.0000 g | INTRAVENOUS | Status: DC
  Filled 2015-11-27: qty 10

## 2015-11-27 MED ORDER — LORAZEPAM 0.5 MG PO TABS
0.5000 mg | ORAL_TABLET | Freq: Three times a day (TID) | ORAL | Status: DC | PRN
  Administered 2015-11-27: 0.5 mg via ORAL
  Filled 2015-11-27: qty 1

## 2015-11-27 MED ORDER — FLUTICASONE PROPIONATE 50 MCG/ACT NA SUSP
2.0000 | Freq: Two times a day (BID) | NASAL | Status: DC | PRN
  Filled 2015-11-27: qty 16

## 2015-11-27 NOTE — Telephone Encounter (Signed)
Augmentin 875 mg take one pill twice daily  X 10 days - take at breakfast and supper with large glass of water.  It would help reduce the usual side effects (diarrhea and yeast infections) if you ate cultured yogurt at lunch.  

## 2015-11-27 NOTE — ED Notes (Signed)
Patient at Xray.

## 2015-11-27 NOTE — ED Provider Notes (Signed)
[facility] DEPT Provider Note   CSN: [id number] Arrival date & time: 11/27/15  1612     History   Chief Complaint Chief Complaint  Patient presents with  . Fever    HPI GAGANDEEP SIEGERT is a 82 y.o. male.  Level V Caveat: Dementia  GISCARD NOUN is a 82 y.o. Male who presents to the ED with his wife complaining of fever and cough starting today. Patient has a history of dementia and is not the best historian. Wife reports that patient did have a fever 4 days ago but this resolved. She reports the fever returned today and was 102 at home. She provided him with Tylenol at home around 2:30 PM. She reports this morning he seemed to not be acting himself. He seemed more sluggish and she's had to help him get around more. Patient does report some coughing. Wife also reports increased coughing. No wheezing or increased need for breathing treatments. Wife reports he is mentating at his baseline currently. She reports he has previously been admitted for pneumonia about 5 months ago. No recent admissions to the hospital. Patient denies any pain or complaint other than coughing. He denies feeling short of breath or having chest pain. Wife denies any vomiting, diarrhea, falls, or rashes.    The history is provided by the patient and the spouse. No language interpreter was used.  Fever   Associated symptoms include cough. Pertinent negatives include no chest pain.    Past Medical History:  Diagnosis Date  . Acid reflux disease   . Arthritis    "all over"  . Arthritis   . Barrett's esophagus   . Basal cell carcinoma of nose   . CAROTID ARTERY STENOSIS, BILATERAL 08/27/2008  . Chronic bronchitis ([place])    "most q yr; he's had it several times" (12/20/2013)  . COPD (chronic obstructive pulmonary disease) ([place])    "severe" (12/20/2013)  . COPD (chronic obstructive pulmonary disease) ([place])   . Coronary artery stenosis   . Diabetes mellitus without complication ([place])   . Diaphragmatic  hernia without mention of obstruction or gangrene   . Diverticulitis   . Diverticulosis of colon   . Dyspnea   . Esophageal stenosis   . Fatty liver disease, nonalcoholic   . GERD (gastroesophageal reflux disease)   . Glaucoma   . Glaucoma   . High cholesterol   . History of blood transfusion 2007   "related to OR"  . History of hiatal hernia    "repaired when esophagus removed"  . History of stomach ulcers   . HTN (hypertension)   . Hyperlipidemia   . Hypertension   . Kidney stones    "passed them all" (12/20/2013)  . Memory loss    DEMENTIA  . Personal history of other malignant neoplasm of skin   . Pneumonia 1990's X 4  . RENAL CALCULUS, HX OF 08/28/2008  . Renal disorder   . Sinus headache    "seasonal"  . Stroke ([place])   . TIA (transient ischemic attack) 1998 X "a few"  . Type II diabetes mellitus [facility])     Patient Active Problem List   Diagnosis Date Noted  . CAP (community acquired pneumonia) 11/27/2015  . Pseudobulbar affect 07/02/2015  . Controlled diabetes mellitus type 2 with complications ([place]) [id number]  . GERD without esophagitis 06/12/2015  . Cerebral thrombosis with cerebral infarction 06/05/2015  . HCAP (healthcare-associated pneumonia) 06/02/2015  . Urinary incontinence 04/13/2015  . Moderate dementia with behavioral disturbance 03/09/2015  .  Depression 03/09/2015  . Legal blindness [place] 08/07/2014  . Routine general medical examination at a health care facility 04/24/2014  . Carotid stenosis 02/24/2014  . Barrett's esophagus 10/11/2011  . FATTY LIVER DISEASE 08/27/2008  . HLD (hyperlipidemia) 05/20/2008  . Glaucoma 05/20/2008  . Essential hypertension 05/20/2008  . COPD GOLD II  05/20/2008  . ARTHRITIS 05/20/2008    Past Surgical History:  Procedure Laterality Date  . BASAL CELL CARCINOMA EXCISION    . CAROTID ARTERY ANGIOPLASTY    . CAROTID ENDARTERECTOMY Bilateral 02/1996-03/1996  . CATARACT EXTRACTION W/ INTRAOCULAR LENS IMPLANT Bilateral  04/2007 - 09/2007   "left-right"  . ESOPHAGECTOMY  2007   with stomach pull through  . ESOPHAGOGASTRODUODENOSCOPY (EGD) WITH ESOPHAGEAL DILATION  10/2011  . EYE SURGERY Right 04/2006; 08/2007   "had gas bubbles put in"  . EYE SURGERY Right 01/2007   "air bubble"  . EYE SURGERY Left 12/12/2013   "cleaned his implant w/laser"  . EYE SURGERY    . GLAUCOMA SURGERY Right 11/2008   "implanted drain tube"  . HERNIA REPAIR    . KNEE ARTHROSCOPY Bilateral 04/1989; 08/1996; 06/1999   "right; left; left"  . KNEE SURGERY    . MOHS SURGERY  ?2002   "tip of nose; basal cell"  . NASAL POLYP EXCISION  2005   "benign"  . PARS PLANA VITRECTOMY Bilateral 01/2007-05/2007  . SHOULDER ARTHROSCOPY Right 04/1998  . SHOULDER SURGERY    . VENTRAL HERNIA REPAIR  02/2006       Home Medications    Prior to Admission medications   Medication Sig Start Date End Date Taking? Authorizing Provider  acetaminophen (TYLENOL) 650 MG CR tablet Take 650 mg by mouth every 6 (six) hours as needed for pain.    Historical Provider, MD  albuterol (PROVENTIL HFA;VENTOLIN HFA) 108 (90 Base) MCG/ACT inhaler Inhale 2 puffs into the lungs every 4 (four) hours as needed for wheezing or shortness of breath.    Historical Provider, MD  albuterol (PROVENTIL) (2.5 MG/3ML) 0.083% nebulizer solution Take 2.5 mg by nebulization every 4 (four) hours as needed for wheezing or shortness of breath.    Historical Provider, MD  amLODipine (NORVASC) 10 MG tablet Take 1 tablet (10 mg total) by mouth daily. 08/27/15   Hoyt Koch, MD  brimonidine (ALPHAGAN) 0.15 % ophthalmic solution Place 1 drop into the left eye 2 (two) times daily. 06/09/15   Bonnell Public, MD  budesonide-formoterol [facility]) 160-4.5 MCG/ACT inhaler Inhale 2 puffs into the lungs 2 (two) times daily.    Historical Provider, MD  Cholecalciferol (VITAMIN D) 1000 UNITS capsule Take 1,000 Units by mouth daily.      Historical Provider, MD  cloNIDine (CATAPRES) 0.2 MG tablet  Take 0.2 mg by mouth. Only take 1/2 tablet if systolic is over [id number].    Historical Provider, MD  clopidogrel (PLAVIX) 75 MG tablet TAKE 1 TABLET BY MOUTH EVERY DAY 08/20/15   Rosalin Hawking, MD  Dextromethorphan-Quinidine 20-10 MG CAPS Take 1 capsule by mouth 2 (two) times daily. 08/27/15   Hoyt Koch, MD  donepezil (ARICEPT) 10 MG tablet Take 10 mg by mouth at bedtime.    Historical Provider, MD  fluticasone (FLONASE) 50 MCG/ACT nasal spray Place 2 sprays into both nostrils 2 (two) times daily as needed for allergies.     Historical Provider, MD  LORazepam (ATIVAN) 0.5 MG tablet Take 0.5 mg by mouth every 6 (six) hours as needed for anxiety.    Historical Provider,  MD  losartan (COZAAR) 100 MG tablet TAKE 1 TABLET(100 MG) BY MOUTH DAILY 11/02/15   Hoyt Koch, MD  metFORMIN (GLUCOPHAGE) 500 MG tablet Take 500 mg by mouth 2 (two) times daily with a meal.     Historical Provider, MD  mirtazapine (REMERON) 7.5 MG tablet Take 15 mg by mouth at bedtime.     Historical Provider, MD  omeprazole (PRILOSEC) 20 MG capsule Take 20 mg by mouth 2 (two) times daily before a meal.     Historical Provider, MD  potassium chloride SA (K-DUR,KLOR-CON) 20 MEQ tablet TAKE 1 TABLET(20 MEQ) BY MOUTH DAILY 11/03/15   Hoyt Koch, MD  predniSONE (DELTASONE) 10 MG tablet Take  4 each am x 2 days,   2 each am x 2 days,  1 each am x 2 days and stop 10/30/15   Tanda Rockers, MD  simvastatin (ZOCOR) 20 MG tablet TAKE 1 TABLET BY MOUTH DAILY AT 6 PM 10/30/15   Hoyt Koch, MD  valACYclovir (VALTREX) 500 MG tablet Take 500 mg by mouth 2 (two) times daily as needed. For [place] Provider, MD  vitamin B-12 (CYANOCOBALAMIN) 1000 MCG tablet Take 1,000 mcg by mouth daily.    Historical Provider, MD    Family History Family History  Problem Relation Age of Onset  . Colon cancer Mother   . Lung cancer Mother   . Heart attack Mother   . Stomach cancer Father   . Esophageal cancer Father    . Colon cancer Maternal Grandfather   . Heart attack Brother   . Heart attack Brother     Social History Social History  Substance Use Topics  . Smoking status: Former Smoker    Packs/day: 1.00    Years: 40.00    Types: Cigarettes    Quit date: 01/24/1978  . Smokeless tobacco: Never Used  . Alcohol use No     Allergies   Avelox [moxifloxacin hcl in nacl]; Moxifloxacin; Quinolones; Sulfonamide derivatives; Timolol maleate; Timoptic [timolol maleate]; and Sulfa antibiotics   Review of Systems Review of Systems  Unable to perform ROS: Dementia  Constitutional: Positive for fever.  Respiratory: Positive for cough. Negative for shortness of breath.   Cardiovascular: Negative for chest pain.  Skin: Negative for rash.     Physical Exam Updated Vital Signs BP 141/60   Pulse 100   Temp (!) 103.6 F (39.8 C) (Rectal)   Resp (!) 30   Ht 5\' 9"  (1.753 m)   Wt 74.8 kg   SpO2 93%   BMI 24.37 kg/m   Physical Exam  Constitutional: He appears well-developed and well-nourished. No distress.  Nontoxic appearing.  HENT:  Head: Normocephalic and atraumatic.  Mouth/Throat: Oropharynx is clear and moist.  Eyes: Conjunctivae are normal. Pupils are equal, round, and reactive to light. Right eye exhibits no discharge. Left eye exhibits no discharge.  Neck: Neck supple.  Cardiovascular: Normal rate, regular rhythm, normal heart sounds and intact distal pulses.   Heart rate is 100. Good capillary refill. Good bilateral radial pulses.  Pulmonary/Chest: Effort normal. No stridor. No respiratory distress.  Lung sounds diminished his bilateral bases. No overt wheezing. No increased work of breathing.  Abdominal: Soft. There is no tenderness.  Musculoskeletal: He exhibits no edema or tenderness.  No lower extremity edema or tenderness.  Lymphadenopathy:    He has no cervical adenopathy.  Neurological: He is alert. Coordination normal.  The patient is alert and oriented to person  and  place.  Skin: Skin is warm and dry. Capillary refill takes less than 2 seconds. No rash noted. He is not diaphoretic. No erythema. No pallor.  Psychiatric: He has a normal mood and affect. His behavior is normal.  Nursing note and vitals reviewed.    ED Treatments / Results  Labs (all labs ordered are listed, but only abnormal results are displayed) Labs Reviewed  COMPREHENSIVE METABOLIC PANEL - Abnormal; Notable for the following:       Result Value   CO2 21 (*)    Glucose, Bld 138 (*)    GFR calc non Af Amer 53 (*)    All other components within normal limits  CBC - Abnormal; Notable for the following:    WBC 12.7 (*)    RDW 16.4 (*)    All other components within normal limits  I-STAT CG4 LACTIC ACID, ED - Abnormal; Notable for the following:    Lactic Acid, Venous 2.04 (*)    All other components within normal limits  URINE CULTURE  CULTURE, BLOOD (ROUTINE X 2)  CULTURE, BLOOD (ROUTINE X 2)  URINALYSIS, ROUTINE W REFLEX MICROSCOPIC (NOT AT [facility])  I-STAT CG4 LACTIC ACID, ED    EKG  EKG Interpretation None       Radiology Dg Chest 2 View  Result Date: 11/27/2015 CLINICAL DATA:  Cough and wheezing for few days EXAM: CHEST  2 VIEW COMPARISON:  None. FINDINGS: Cardiac shadow is within normal limits. The lungs are well aerated bilaterally. Increased left retrocardiac density is noted consistent with focal consolidation. The right lung is clear. No acute bony abnormality is noted. IMPRESSION: Left retrocardiac lower lobe infiltrate Electronically Signed   By: Inez Catalina M.D.   On: 11/27/2015 17:21    Procedures Procedures (including critical care time)  Medications Ordered in ED Medications  cefTRIAXone (ROCEPHIN) 1 g in dextrose 5 % 50 mL IVPB (1 g Intravenous New Bag/Given 11/27/15 1823)  azithromycin (ZITHROMAX) 500 mg in dextrose 5 % 250 mL IVPB (500 mg Intravenous New Bag/Given 11/27/15 1823)  ibuprofen (ADVIL,MOTRIN) tablet 600 mg (not administered)  cefTRIAXone  (ROCEPHIN) 1 g in dextrose 5 % 50 mL IVPB (not administered)  azithromycin (ZITHROMAX) 500 mg in dextrose 5 % 250 mL IVPB (not administered)  sodium chloride 0.9 % bolus 1,000 mL (1,000 mLs Intravenous New Bag/Given 11/27/15 1823)     Initial Impression / Assessment and Plan / ED Course  I have reviewed the triage vital signs and the nursing notes.  Pertinent labs & imaging results that were available during my care of the patient were reviewed by me and considered in my medical decision making (see chart for details).  Clinical Course   This is a 82 y.o. Male who presents to the ED with his wife complaining of fever and cough starting today. Patient has a history of dementia and is not the best historian. Wife reports that patient did have a fever 4 days ago but this resolved. She reports the fever returned today and was 102 at home. She provided him with Tylenol at home around 2:30 PM. She reports this morning he seemed to not be acting himself. He seemed more sluggish and she's had to help him get around more. Patient does report some coughing. Wife also reports increased coughing. No wheezing or increased need for breathing treatments. Wife reports he is mentating at his baseline currently. She reports he has previously been admitted for pneumonia about 5 months ago. No recent admissions  to the hospital. On arrival to the emergency department the patient has a rectal temperature of 103.6. On exam he is nontoxic appearing. His oxygen saturation is 99% on room air. He has no increased work of breathing. He has diminished lung sounds bilaterally. He complains of cough. No LE edema.  Code sepsis activated at the time of my evaluation. Chest x-ray reveals left retrocardiac lower lobe infiltrate. No recent hospital admissions. Patient started on community-acquired pneumonia treatment. Flu bolus initiated. CBC reveals a white count of 12,700. Will consult for admission. Patient and family are in agreement  with admission.  I consulted with hospitalist Dr. Gilles Chiquito who accepted the patient for admission. She requested temporary orders for a telemetry bed.   Final Clinical Impressions(s) / ED Diagnoses   Final diagnoses:  Community acquired pneumonia of left lung, unspecified part of lung    New Prescriptions New Prescriptions   No medications on file     Delshaun Montiel, PA-C 11/27/15 [place], MD 11/27/15 [id number]

## 2015-11-27 NOTE — H&P (Addendum)
History and Physical    Chad [name] [id number] DOB: [birth year] DOA: 11/27/2015  PCP: Hoyt Koch, MD Patient coming from: Home  Chief Complaint: Cough and fever  HPI: Chad [name] is a 82 y.o. male with medical history significant of DM2, stroke with left sided weakness, Dementia, HTN, HLD, glaucome, esophageal stenosis/barrett's esophagus, CAD, COPD, GERD, arthritis who presents for 1 day history of fever to 102 at home and cough.  The cough has been dry, but persistent, no sputum, + congestion.  His wife is present and gives most of the history.  She notes that on Monday he developed a fever that responded to tylenol and did not come back, however, this morning he developed a fever that persisted.  She attempted to get him an appointment with his pulmonologist, but they did not have a slot.  She brought him to the ED.  He was a fever here as well.  Further symptoms include wheezing, back pain, weakness, fatigue and night sweats X 1.  He has been eating and drinking okay.  No nausea, vomiting, chest pain, abdominal pain. No diarrhea or constipation.  He has no urinary symptoms.  His wife notes that during his last pneumonia, he also had a stroke and has left sided weakness but walks on his own.  She is concerned because for the last 2 days that he has not been feeling well he has been listing to the left side and seems weaker.  She is not clear if he is having unilateral weakness, but feels that the weakness is generalized.    ED Course: Noted to have a fever in the ED.  WBC 12.7 and CXR showed LLL infiltrate.  Lactic acid was initially elevated, but resolved to 1.57 with fluids.   Review of Systems: As per HPI otherwise 10 point review of systems negative.   Past Medical History:  Diagnosis Date  . Acid reflux disease   . Arthritis    "all over"  . Arthritis   . Barrett's esophagus   . Basal cell carcinoma of nose   . CAROTID ARTERY STENOSIS, BILATERAL 08/27/2008  .  Chronic bronchitis ([place])    "most q yr; he's had it several times" (12/20/2013)  . COPD (chronic obstructive pulmonary disease) ([place])    "severe" (12/20/2013)  . COPD (chronic obstructive pulmonary disease) ([place])   . Coronary artery stenosis   . Diabetes mellitus without complication ([place])   . Diaphragmatic hernia without mention of obstruction or gangrene   . Diverticulitis   . Diverticulosis of colon   . Dyspnea   . Esophageal stenosis   . Fatty liver disease, nonalcoholic   . GERD (gastroesophageal reflux disease)   . Glaucoma   . Glaucoma   . High cholesterol   . History of blood transfusion 2007   "related to OR"  . History of hiatal hernia    "repaired when esophagus removed"  . History of stomach ulcers   . HTN (hypertension)   . Hyperlipidemia   . Hypertension   . Kidney stones    "passed them all" (12/20/2013)  . Memory loss    DEMENTIA  . Personal history of other malignant neoplasm of skin   . Pneumonia 1990's X 4  . RENAL CALCULUS, HX OF 08/28/2008  . Renal disorder   . Sinus headache    "seasonal"  . Stroke ([place])   . TIA (transient ischemic attack) 1998 X "a few"  . Type II diabetes mellitus ([place])  Past Surgical History:  Procedure Laterality Date  . BASAL CELL CARCINOMA EXCISION    . CAROTID ARTERY ANGIOPLASTY    . CAROTID ENDARTERECTOMY Bilateral 02/1996-03/1996  . CATARACT EXTRACTION W/ INTRAOCULAR LENS IMPLANT Bilateral 04/2007 - 09/2007   "left-right"  . ESOPHAGECTOMY  2007   with stomach pull through  . ESOPHAGOGASTRODUODENOSCOPY (EGD) WITH ESOPHAGEAL DILATION  10/2011  . EYE SURGERY Right 04/2006; 08/2007   "had gas bubbles put in"  . EYE SURGERY Right 01/2007   "air bubble"  . EYE SURGERY Left 12/12/2013   "cleaned his implant w/laser"  . EYE SURGERY    . GLAUCOMA SURGERY Right 11/2008   "implanted drain tube"  . HERNIA REPAIR    . KNEE ARTHROSCOPY Bilateral 04/1989; 08/1996; 06/1999   "right; left; left"  . KNEE SURGERY    . MOHS SURGERY   ?2002   "tip of nose; basal cell"  . NASAL POLYP EXCISION  2005   "benign"  . PARS PLANA VITRECTOMY Bilateral 01/2007-05/2007  . SHOULDER ARTHROSCOPY Right 04/1998  . SHOULDER SURGERY    . VENTRAL HERNIA REPAIR  02/2006     reports that he quit smoking about 37 years ago. His smoking use included Cigarettes. He has a 40.00 pack-year smoking history. He has never used smokeless tobacco. He reports that he does not drink alcohol or use drugs.  Allergies  Allergen Reactions  . Avelox [Moxifloxacin Hcl In Nacl] Anaphylaxis  . Moxifloxacin Anaphylaxis    Avelox REACTION: anaphylaxis  . Quinolones Anaphylaxis  . Sulfonamide Derivatives     REACTION: hives  . Timolol Maleate     Causes severe chest congestion  . Timoptic [Timolol Maleate] Other (See Comments)    Congestion  . Sulfa Antibiotics Rash    Family History  Problem Relation Age of Onset  . Colon cancer Mother   . Lung cancer Mother   . Heart attack Mother   . Stomach cancer Father   . Esophageal cancer Father   . Colon cancer Maternal Grandfather   . Heart attack Brother   . Heart attack Brother     Prior to Admission medications   Medication Sig Start Date End Date Taking? Authorizing Provider  acetaminophen (TYLENOL) 650 MG CR tablet Take 650 mg by mouth every 6 (six) hours as needed for pain.    Historical Provider, MD  albuterol (PROVENTIL HFA;VENTOLIN HFA) 108 (90 Base) MCG/ACT inhaler Inhale 2 puffs into the lungs every 4 (four) hours as needed for wheezing or shortness of breath.    Historical Provider, MD  albuterol (PROVENTIL) (2.5 MG/3ML) 0.083% nebulizer solution Take 2.5 mg by nebulization every 4 (four) hours as needed for wheezing or shortness of breath.    Historical Provider, MD  amLODipine (NORVASC) 10 MG tablet Take 1 tablet (10 mg total) by mouth daily. 08/27/15   Hoyt Koch, MD  brimonidine (ALPHAGAN) 0.15 % ophthalmic solution Place 1 drop into the left eye 2 (two) times daily. 06/09/15    Bonnell Public, MD  budesonide-formoterol [facility]) 160-4.5 MCG/ACT inhaler Inhale 2 puffs into the lungs 2 (two) times daily.    Historical Provider, MD  Cholecalciferol (VITAMIN D) 1000 UNITS capsule Take 1,000 Units by mouth daily.      Historical Provider, MD  cloNIDine (CATAPRES) 0.2 MG tablet Take 0.2 mg by mouth. Only take 1/2 tablet if systolic is over [id number].    Historical Provider, MD  clopidogrel (PLAVIX) 75 MG tablet TAKE 1 TABLET BY MOUTH EVERY DAY 08/20/15  Rosalin Hawking, MD  Dextromethorphan-Quinidine 20-10 MG CAPS Take 1 capsule by mouth 2 (two) times daily. 08/27/15   Hoyt Koch, MD  donepezil (ARICEPT) 10 MG tablet Take 10 mg by mouth at bedtime.    Historical Provider, MD  fluticasone (FLONASE) 50 MCG/ACT nasal spray Place 2 sprays into both nostrils 2 (two) times daily as needed for allergies.     Historical Provider, MD  LORazepam (ATIVAN) 0.5 MG tablet Take 0.5 mg by mouth every 6 (six) hours as needed for anxiety.    Historical Provider, MD  losartan (COZAAR) 100 MG tablet TAKE 1 TABLET(100 MG) BY MOUTH DAILY 11/02/15   Hoyt Koch, MD  metFORMIN (GLUCOPHAGE) 500 MG tablet Take 500 mg by mouth 2 (two) times daily with a meal.     Historical Provider, MD  mirtazapine (REMERON) 7.5 MG tablet Take 15 mg by mouth at bedtime.     Historical Provider, MD  omeprazole (PRILOSEC) 20 MG capsule Take 20 mg by mouth 2 (two) times daily before a meal.     Historical Provider, MD  potassium chloride SA (K-DUR,KLOR-CON) 20 MEQ tablet TAKE 1 TABLET(20 MEQ) BY MOUTH DAILY 11/03/15   Hoyt Koch, MD  predniSONE (DELTASONE) 10 MG tablet Take  4 each am x 2 days,   2 each am x 2 days,  1 each am x 2 days and stop 10/30/15   Tanda Rockers, MD  simvastatin (ZOCOR) 20 MG tablet TAKE 1 TABLET BY MOUTH DAILY AT 6 PM 10/30/15   Hoyt Koch, MD  valACYclovir (VALTREX) 500 MG tablet Take 500 mg by mouth 2 (two) times daily as needed. For [place]  Provider, MD  vitamin B-12 (CYANOCOBALAMIN) 1000 MCG tablet Take 1,000 mcg by mouth daily.    Historical Provider, MD    Physical Exam: Vitals:   11/27/15 1745 11/27/15 1800 11/27/15 1930 11/27/15 2013  BP:  141/60 114/95 100/83  Pulse: 100 100 101 87  Resp: (!) 29 (!) 30 (!) 31 20  Temp:    98.7 F (37.1 C)  TempSrc:    Oral  SpO2: 93% 93% 95% 99%  Weight:    162 lb 8 oz (73.7 kg)  Height:    5\' 9"  (1.753 m)      Constitutional: NAD, calm, comfortable, sitting up in bed Vitals:   11/27/15 1745 11/27/15 1800 11/27/15 1930 11/27/15 2013  BP:  141/60 114/95 100/83  Pulse: 100 100 101 87  Resp: (!) 29 (!) 30 (!) 31 20  Temp:    98.7 F (37.1 C)  TempSrc:    Oral  SpO2: 93% 93% 95% 99%  Weight:    162 lb 8 oz (73.7 kg)  Height:    5\' 9"  (1.753 m)   Eyes: PERRL, no conjunctival pallor, no icterus ENMT: MMM, tongue midline Respiratory: Decreased breath sounds in LL base.  + minimal wheezing throughout Cardiovascular: RR, NR no murmur noted.  Pulses intact in radial arteries and DP bilaterally.  Abdomen: Large ventral hernia, most noted with coughing, soft, no tenderness to palpation, + BS throughout  Musculoskeletal: Thin LE, no edema, normal muscle tone Skin: No rashes or lesions noted.  Neurologic: CN 2-12 grossly intact. Strength 4+/5 in left upper and lower extremity.  Otherwise 5/5 Psychiatric: Answers questions appropriately, oriented to person and place.  Normal mood.   Labs on Admission: I have personally reviewed following labs and imaging studies  CBC:  Recent Labs Lab 11/27/15  1648  WBC 12.7*  HGB 13.1  HCT 40.5  MCV 85.4  PLT 0000000   Basic Metabolic Panel:  Recent Labs Lab 11/27/15 1648  NA 137  K 4.3  CL 103  CO2 21*  GLUCOSE 138*  BUN 20  CREATININE 1.21  CALCIUM 10.0   GFR: Estimated Creatinine Clearance: 45.4 mL/min (by C-G formula based on SCr of 1.21 mg/dL). Liver Function Tests:  Recent Labs Lab 11/27/15 1648  AST 19  ALT 18    ALKPHOS 67  BILITOT 0.6  PROT 6.9  ALBUMIN 3.7   Urine analysis:    Component Value Date/Time   COLORURINE YELLOW 11/27/2015 1819   APPEARANCEUR CLEAR 11/27/2015 1819   LABSPEC 1.020 11/27/2015 1819   PHURINE 5.5 11/27/2015 1819   GLUCOSEU NEGATIVE 11/27/2015 1819   HGBUR NEGATIVE 11/27/2015 1819   BILIRUBINUR NEGATIVE 11/27/2015 1819   BILIRUBINUR neg 08/27/2015 1626   KETONESUR NEGATIVE 11/27/2015 1819   PROTEINUR NEGATIVE 11/27/2015 1819   UROBILINOGEN negative 08/27/2015 1626   UROBILINOGEN 1.0 07/25/2014 1440   NITRITE NEGATIVE 11/27/2015 1819   LEUKOCYTESUR NEGATIVE 11/27/2015 1819   Radiological Exams on Admission: Dg Chest 2 View  Result Date: 11/27/2015 CLINICAL DATA:  Cough and wheezing for few days EXAM: CHEST  2 VIEW COMPARISON:  None. FINDINGS: Cardiac shadow is within normal limits. The lungs are well aerated bilaterally. Increased left retrocardiac density is noted consistent with focal consolidation. The right lung is clear. No acute bony abnormality is noted. IMPRESSION: Left retrocardiac lower lobe infiltrate Electronically Signed   By: Inez Catalina M.D.   On: 11/27/2015 17:21    EKG: Independently reviewed. Mildly tachycardic sinus rhythm.   Assessment/Plan CAP (community acquired pneumonia) - He presents with 1 day history of cough and fever, no sputum production, CXR concerning for infiltrate consistent with CAP - He was initiated on rocephin and azithromycin in the ED which will be continued - Albuterol nebs if needed - Mucinex - Oxygen if needed, pulse ox checks - Sputum culture pending, blood cultures pending - Urine for strep pneumo and legionella  - Flu PCR - IVF with NS at 100cc/hr overnight, monitor for volume issues given grade 1 diastolic CHF  History of Stroke - Wife expressed concern about new apparent left side leaning and weakness, he had weakness on exam and his wife is not sure if this is chronic.  He does not appear to have any  cranial nerve issues.  The new symptoms started > 24 hours ago per his wife.  - He is taking his plavix without issues - CT head without contrast showed only acute infarct - Reviewed old notes and left sided weakness noted in PCP notes in June and then abnormal coordination in August.  I think this may be a chronic issue.  - Consider MRI if weakness persistent or worsening.   HLD (hyperlipidemia) - Continue simvastatin    Essential hypertension - BP mildly elevated on admission, but now improved.   - Lactic acid initially high - Hold BP medications overnight given infection and possible new neuro symptoms.     Moderate dementia with behavioral disturbance - Patient takes PRN ativan for anxiety, will continue this at this time given low dose.  He becomes more anxious when wife not present.  Consider sitter if needed - Ativan PRN - Continue donepazil    Controlled diabetes mellitus type 2 with complications - Last [id number] 7.3 - Hold metformin - SSI    GERD without esophagitis - Protonix.  DVT prophylaxis: Lovenox Code Status: Full Family Communication: Wife, Patsy at bedise.  [phone] Disposition Plan: Anticipate d/c home depending on further work up and improvement.   Admission status: Observation telemetry   Gilles Chiquito MD [facility] Pager [phone]  If 7PM-7AM, please contact night-coverage [email or web address] Password [email or web address]  11/27/2015, 9:15 PM

## 2015-11-27 NOTE — ED Notes (Signed)
Attempted report x1. 

## 2015-11-27 NOTE — ED Triage Notes (Addendum)
Pt presents with c/o fever. Pt had a fever Sunday then again today. Wife reports the patient has been coughing and wheezing and seemed altered from his baseline over the past several days. The pt c/o lower back pain. He is alert now, breathing easily, appears restless. He took 2 extra strength tylenol at 1415. He does not want ibuprofen at this time due to medical history

## 2015-11-27 NOTE — Progress Notes (Signed)
Pharmacy Antibiotic Note Chad LITTEKEN is a 82 y.o. male admitted on 11/27/2015 with fever and concern for CAP. Pharmacy has been consulted for ceftriaxone and azithromycin dosing.  Plan: - Ceftriaxone 1 gram IV every 24 hours - Azithromycin 500 mg IV every 24 hours    Height: 5\' 9"  (175.3 cm) Weight: 165 lb (74.8 kg) IBW/kg (Calculated) : 70.7  Temp (24hrs), Avg:102.3 F (39.1 C), Min:101 F (38.3 C), Max:103.6 F (39.8 C)   Recent Labs Lab 11/27/15 1648 11/27/15 1712 11/27/15 1811  WBC 12.7*  --   --   CREATININE 1.21  --   --   LATICACIDVEN  --  2.04* 1.57    Estimated Creatinine Clearance: 45.4 mL/min (by C-G formula based on SCr of 1.21 mg/dL).    Allergies  Allergen Reactions  . Avelox [Moxifloxacin Hcl In Nacl] Anaphylaxis  . Moxifloxacin Anaphylaxis    Avelox REACTION: anaphylaxis  . Quinolones Anaphylaxis  . Sulfonamide Derivatives     REACTION: hives  . Timolol Maleate     Causes severe chest congestion  . Timoptic [Timolol Maleate] Other (See Comments)    Congestion  . Sulfa Antibiotics Rash   Antimicrobials this admission:  11/3 ceftriaxone >>  11/3 azithromycin  >>   Dose adjustments this admission:  N/a  Microbiology results:  11/3 BCx: px 11/3 UCx: px    Thank you for allowing pharmacy to be a part of this patient's care.  Vincenza Hews, PharmD, BCPS 11/27/2015, 6:25 PM Pager: [phone]

## 2015-11-27 NOTE — ED Notes (Signed)
Jeneen Rinks MD made aware of pt rectal temp.  MD to bedside.

## 2015-11-27 NOTE — Telephone Encounter (Signed)
Called and spoke with [facility] and she stated that the pt had a fever on Sunday and then has been fine until yesterday.  He started with cough and some wheezing.  She started him on mucinex dm yesterday.  She stated that today he has a temp of 102.1, BP is 149/102, BS 155 and the cough is non productive.  She is worried that he may get PNA again. NO appts available today.  MW please advise thanks  Allergies  Allergen Reactions  . Avelox [Moxifloxacin Hcl In Nacl] Anaphylaxis  . Moxifloxacin Anaphylaxis    Avelox REACTION: anaphylaxis  . Quinolones Anaphylaxis  . Sulfonamide Derivatives     REACTION: hives  . Timolol Maleate     Causes severe chest congestion  . Timoptic [Timolol Maleate] Other (See Comments)    Congestion  . Sulfa Antibiotics Rash

## 2015-11-27 NOTE — Telephone Encounter (Signed)
Spoke with pt's wife, she has pt at the ED for further evaluation as pt's symptoms have worsened since wife called in.  Nothing further needed.

## 2015-11-28 DIAGNOSIS — J189 Pneumonia, unspecified organism: Secondary | ICD-10-CM | POA: Diagnosis not present

## 2015-11-28 LAB — BASIC METABOLIC PANEL
Anion gap: 7 (ref 5–15)
BUN: 16 mg/dL (ref 6–20)
CALCIUM: 9 mg/dL (ref 8.9–10.3)
CO2: 27 mmol/L (ref 22–32)
Chloride: 107 mmol/L (ref 101–111)
Creatinine, Ser: 1.14 mg/dL (ref 0.61–1.24)
GFR calc Af Amer: >60 mL/min (ref >60)
GFR, EST NON AFRICAN AMERICAN: 57 mL/min — AB (ref >60)
GLUCOSE: 120 mg/dL — AB (ref 65–99)
Potassium: 3.9 mmol/L (ref 3.5–5.1)
Sodium: 141 mmol/L (ref 135–145)

## 2015-11-28 LAB — GLUCOSE, CAPILLARY: GLUCOSE-CAPILLARY: 122 mg/dL — AB (ref 65–99)

## 2015-11-28 LAB — CBC
HEMATOCRIT: 34.3 % — AB (ref 39.0–52.0)
HEMOGLOBIN: 10.9 g/dL — AB (ref 13.0–17.0)
MCH: 27.3 pg (ref 26.0–34.0)
MCHC: 31.8 g/dL (ref 30.0–36.0)
MCV: 85.8 fL (ref 78.0–100.0)
Platelets: 158 10*3/uL (ref 150–400)
RBC: 4 MIL/uL — ABNORMAL LOW (ref 4.22–5.81)
RDW: 16.6 % — AB (ref 11.5–15.5)
WBC: 10.2 10*3/uL (ref 4.0–10.5)

## 2015-11-28 LAB — STREP PNEUMONIAE URINARY ANTIGEN: Strep Pneumo Urinary Antigen: POSITIVE — AB

## 2015-11-28 LAB — HIV ANTIBODY (ROUTINE TESTING W REFLEX): HIV SCREEN 4TH GENERATION: NONREACTIVE

## 2015-11-28 LAB — INFLUENZA PANEL BY PCR (TYPE A & B)
INFLAPCR: NEGATIVE
INFLBPCR: NEGATIVE

## 2015-11-28 MED ORDER — AZITHROMYCIN 250 MG PO TABS: 250.0000 mg | ORAL_TABLET | Freq: Every day | ORAL | 0 refills | Status: AC

## 2015-11-28 MED ORDER — CEFDINIR 300 MG PO CAPS: 300.0000 mg | ORAL_CAPSULE | Freq: Two times a day (BID) | ORAL | 0 refills | Status: AC

## 2015-11-28 NOTE — Discharge Summary (Signed)
Physician Discharge Summary  Chad [name] [id number] DOB: 1929/10/23 DOA: 11/27/2015  PCP: Hoyt Koch, MD  Admit date: 11/27/2015 Discharge date: 11/28/2015  Admitted From: Home Disposition: Home   Recommendations for Outpatient Follow-up:  1. Follow up with pulmonology within the next week 2. Discharged on cefdinir and azithromycin  Home Health: None Equipment/Devices: None Discharge Condition: Stable CODE STATUS: Full Diet recommendation: Heart health  Brief/Interim Summary: Chad [name] is a 82 y.o. male with medical history significant of DM2, stroke with left sided weakness, dementia, HTN, HLD, glaucome, esophageal stenosis/barrett's esophagus, CAD, COPD, GERD, arthritis who presents for 1 day history of fever to 102 at home and cough. CXR showed infiltrate and he was admitted for CAP. Blood cultures were obtained and rocephin/azithromycin were provided. On the next day he appeared in no distress, on room air, afebrile, eating normally, at his baseline functional status. Strep urine antigen was positive. Oral antibiotics were started and were tolerated. He is discharged with follow up with pulmonology early in the next week and return precautions.   Discharge Diagnoses:  Active Problems:   HLD (hyperlipidemia)   Essential hypertension   Moderate dementia with behavioral disturbance   Controlled diabetes mellitus type 2 with complications (HCC)   GERD without esophagitis   CAP (community acquired pneumonia)   Diabetes mellitus without complication ([place])   Stroke [facility])   Hypertension   Hyperlipidemia   Weakness  Discharge Instructions Discharge Instructions    Discharge instructions    Complete by:  As directed    You were admitted with pneumonia due to Streptococcus pneumoniae, a bacterium commonly causing Pneumococcal Pneumonia. This can be treated as an outpatient because you do not require IV therapy or supplemental oxygen. It is VERY important that  you start taking 2 antibiotics today. They have both been sent to your pharmacy. If you wer unable to get or take these, you must call or return to the hospital.  - Take CEFDINIR twice daily for 10 days - Take azithromycin once daily for the next 4 days - We are unable to schedule a follow up appointment for you over the weekend, but please call Dr. Gustavus Bryant office on Monday morning for a follow up early next week.   Increase activity slowly    Complete by:  As directed        Medication List    TAKE these medications   acetaminophen 500 MG tablet Commonly known as:  TYLENOL Take 1,000 mg by mouth every 6 (six) hours as needed for fever.   albuterol 108 (90 Base) MCG/ACT inhaler Commonly known as:  PROVENTIL HFA;VENTOLIN HFA Inhale 2 puffs into the lungs every 4 (four) hours as needed for wheezing or shortness of breath.   albuterol (2.5 MG/3ML) 0.083% nebulizer solution Commonly known as:  PROVENTIL Take 2.5 mg by nebulization every 4 (four) hours as needed for wheezing or shortness of breath.   amLODipine 10 MG tablet Commonly known as:  NORVASC Take 1 tablet (10 mg total) by mouth daily.   azithromycin 250 MG tablet Commonly known as:  ZITHROMAX Take 1 tablet (250 mg total) by mouth daily.   brimonidine 0.15 % ophthalmic solution Commonly known as:  ALPHAGAN Place 1 drop into the left eye 2 (two) times daily.   budesonide-formoterol 160-4.5 MCG/ACT inhaler Commonly known as:  SYMBICORT Inhale 2 puffs into the lungs 2 (two) times daily.   cefdinir 300 MG capsule Commonly known as:  OMNICEF Take 1 capsule (300 mg total)  by mouth 2 (two) times daily.   cloNIDine 0.2 MG tablet Commonly known as:  CATAPRES Take 0.05-0.1 mg by mouth daily as needed (for SBP >140). Only take 1/2 tablet if systolic is over [id number].   clopidogrel 75 MG tablet Commonly known as:  PLAVIX TAKE 1 TABLET BY MOUTH EVERY DAY   Dextromethorphan-Quinidine 20-10 MG Caps Take 1 capsule by mouth 2 (two)  times daily. What changed:  when to take this   donepezil 10 MG tablet Commonly known as:  ARICEPT Take 10 mg by mouth at bedtime.   fluticasone 50 MCG/ACT nasal spray Commonly known as:  FLONASE Place 2 sprays into both nostrils 2 (two) times daily as needed for allergies.   LORazepam 0.5 MG tablet Commonly known as:  ATIVAN Take 0.5 mg by mouth at bedtime.   losartan 100 MG tablet Commonly known as:  COZAAR TAKE 1 TABLET(100 MG) BY MOUTH DAILY   metFORMIN 500 MG tablet Commonly known as:  GLUCOPHAGE Take 500 mg by mouth 2 (two) times daily with a meal.   mirtazapine 7.5 MG tablet Commonly known as:  REMERON Take 15 mg by mouth at bedtime.   omeprazole 20 MG capsule Commonly known as:  PRILOSEC Take 20 mg by mouth 2 (two) times daily before a meal.   potassium chloride SA 20 MEQ tablet Commonly known as:  K-DUR,KLOR-CON TAKE 1 TABLET(20 MEQ) BY MOUTH DAILY   PRESERVISION AREDS 2 Caps Take 1 capsule by mouth 2 (two) times daily.   simvastatin 20 MG tablet Commonly known as:  ZOCOR TAKE 1 TABLET BY MOUTH DAILY AT 6 PM   valACYclovir 500 MG tablet Commonly known as:  VALTREX Take 500 mg by mouth 2 (two) times daily as needed. For Canker Sores   vitamin B-12 1000 MCG tablet Commonly known as:  CYANOCOBALAMIN Take 1,000 mcg by mouth daily.   Vitamin D 1000 units capsule Take 1,000 Units by mouth daily.      Follow-up Information    Christinia Gully, MD. Schedule an appointment as soon as possible for a visit on 11/30/2015.   Specialty:  Pulmonary Disease Contact information: [address] [address] [phone]        Hoyt Koch, MD .   Specialty:  Internal Medicine Contact information: [place] [address] [phone]          Allergies  Allergen Reactions  . Moxifloxacin Anaphylaxis    Avelox REACTION: anaphylaxis  . Quinolones Anaphylaxis  . Sulfonamide Derivatives Hives  . Timolol Maleate Other  (See Comments)    Causes severe chest congestion  . Sulfa Antibiotics Rash    Consultations:  None  Procedures/Studies: Dg Chest 2 View  Result Date: 11/27/2015 CLINICAL DATA:  Cough and wheezing for few days EXAM: CHEST  2 VIEW COMPARISON:  None. FINDINGS: Cardiac shadow is within normal limits. The lungs are well aerated bilaterally. Increased left retrocardiac density is noted consistent with focal consolidation. The right lung is clear. No acute bony abnormality is noted. IMPRESSION: Left retrocardiac lower lobe infiltrate Electronically Signed   By: Inez Catalina M.D.   On: 11/27/2015 17:21   Dg Chest 2 View  Result Date: 10/30/2015 CLINICAL DATA:  Congestion for 7 days EXAM: CHEST  2 VIEW COMPARISON:  July 13, 2015 FINDINGS: There is scarring in the left base region. There is no edema or consolidation. Heart is upper normal in size with pulmonary vascularity within normal limits. There is a sizable hiatal  type hernia. There is atherosclerotic calcification aorta. There is prominent epicardial fat, best appreciated on the lateral view. No adenopathy. No bone lesions. IMPRESSION: No appreciable change from prior study. Scarring left base. No edema or consolidation. Stable cardiac silhouette. Sizable hiatal hernia. Aortic atherosclerosis. Electronically Signed   By: Lowella Grip III M.D.   On: 10/30/2015 11:50   Ct Head Wo Contrast  Result Date: 11/27/2015 CLINICAL DATA:  Fever today. History of stroke in LEFT-sided weakness, dementia. Evaluate weakness. EXAM: CT HEAD WITHOUT CONTRAST TECHNIQUE: Contiguous axial images were obtained from the base of the skull through the vertex without intravenous contrast. COMPARISON:  None. FINDINGS: BRAIN: Moderate to severe ventriculomegaly with disproportionate sulcal effacement at the convexities. Old RIGHT corona radiata/basal ganglia lacunar infarct. Patchy to confluent supratentorial white matter hypodensities. No midline shift, mass effect,  hemorrhage or acute large vascular territory infarcts. No abnormal extra-axial fluid collections. Basal cisterns are patent. VASCULAR: Mild calcific atherosclerosis of the carotid siphons. SKULL: No skull fracture. No significant scalp soft tissue swelling. SINUSES/ORBITS: Lobulated paranasal sinus mucosal thickening. Mastoid air cells are well aerated.RIGHT glaucoma drainage device. Bilateral ocular lens implants. The included ocular globes and orbital contents are non-suspicious. OTHER: None. IMPRESSION: No acute intracranial process. Findings of normal pressure hydrocephalus. Old RIGHT corona radiata/basal ganglia infarct and moderate to severe chronic small vessel ischemic disease. Electronically Signed   By: Elon Alas M.D.   On: 11/27/2015 21:54     Subjective: Pt feels well, coughing but not short of breath. No chest pain. Wife reports he is at his baseline.   Discharge Exam: Vitals:   11/28/15 0429 11/28/15 1001  BP: 132/60 (!) 116/47  Pulse: 79 65  Resp: 18 18  Temp: 97.8 F (36.6 C) 98.2 F (36.8 C)   Vitals:   11/28/15 0031 11/28/15 0429 11/28/15 0854 11/28/15 1001  BP: (!) 108/48 132/60  (!) 116/47  Pulse: 70 79  65  Resp: 18 18  18   Temp: 97.7 F (36.5 C) 97.8 F (36.6 C)  98.2 F (36.8 C)  TempSrc: Oral Oral  Oral  SpO2: 96% 96% 96% 90%  Weight:  74.3 kg (163 lb 12.8 oz)    Height:       General: Pt is alert, awake, not in acute distress Cardiovascular: RRR, S1/S2 +, no rubs, no gallops Respiratory: CTA bilaterally, no wheezing, no rhonchi Abdominal: Soft, NT, ND, bowel sounds + Extremities: no edema, no cyanosis  The results of significant diagnostics from this hospitalization (including imaging, microbiology, ancillary and laboratory) are listed below for reference.    Microbiology: No results found for this or any previous visit (from the past 240 hour(s)).   Labs: BNP (last 3 results)  Recent Labs  06/01/15 1844  BNP 123456*   Basic Metabolic  Panel:  Recent Labs Lab 11/27/15 1648 11/28/15 0508  NA 137 141  K 4.3 3.9  CL 103 107  CO2 21* 27  GLUCOSE 138* 120*  BUN 20 16  CREATININE 1.21 1.14  CALCIUM 10.0 9.0   Liver Function Tests:  Recent Labs Lab 11/27/15 1648  AST 19  ALT 18  ALKPHOS 67  BILITOT 0.6  PROT 6.9  ALBUMIN 3.7   No results for input(s): LIPASE, AMYLASE in the last 168 hours. No results for input(s): AMMONIA in the last 168 hours. CBC:  Recent Labs Lab 11/27/15 1648 11/28/15 0508  WBC 12.7* 10.2  HGB 13.1 10.9*  HCT 40.5 34.3*  MCV 85.4 85.8  PLT 204 158  Cardiac Enzymes: No results for input(s): CKTOTAL, CKMB, CKMBINDEX, TROPONINI in the last 168 hours. BNP: Invalid input(s): POCBNP CBG:  Recent Labs Lab 11/27/15 2125 11/28/15 0640  GLUCAP 198* 122*   D-Dimer No results for input(s): DDIMER in the last 72 hours. Hgb A1c No results for input(s): HGBA1C in the last 72 hours. Lipid Profile No results for input(s): CHOL, HDL, LDLCALC, TRIG, CHOLHDL, LDLDIRECT in the last 72 hours. Thyroid function studies No results for input(s): TSH, T4TOTAL, T3FREE, THYROIDAB in the last 72 hours.  Invalid input(s): FREET3 Anemia work up No results for input(s): VITAMINB12, FOLATE, FERRITIN, TIBC, IRON, RETICCTPCT in the last 72 hours. Urinalysis    Component Value Date/Time   COLORURINE YELLOW 11/27/2015 1819   APPEARANCEUR CLEAR 11/27/2015 1819   LABSPEC 1.020 11/27/2015 1819   PHURINE 5.5 11/27/2015 1819   GLUCOSEU NEGATIVE 11/27/2015 1819   HGBUR NEGATIVE 11/27/2015 1819   BILIRUBINUR NEGATIVE 11/27/2015 1819   BILIRUBINUR neg 08/27/2015 1626   KETONESUR NEGATIVE 11/27/2015 1819   PROTEINUR NEGATIVE 11/27/2015 1819   UROBILINOGEN negative 08/27/2015 1626   UROBILINOGEN 1.0 07/25/2014 1440   NITRITE NEGATIVE 11/27/2015 1819   LEUKOCYTESUR NEGATIVE 11/27/2015 1819   Sepsis Labs Invalid input(s): PROCALCITONIN,  WBC,  LACTICIDVEN Microbiology No results found for this or  any previous visit (from the past 240 hour(s)).  Time coordinating discharge: Over 30 minutes  Vance Gather, MD  [facility] 11/28/2015, 11:08 AM Pager [phone]  If 7PM-7AM, please contact night-coverage [email or web address] Password [email or web address]

## 2015-11-30 ENCOUNTER — Encounter: Payer: Medicare HMO | Admitting: Adult Health

## 2015-11-30 LAB — LEGIONELLA PNEUMOPHILA SEROGP 1 UR AG: L. PNEUMOPHILA SEROGP 1 UR AG: NEGATIVE

## 2015-12-02 ENCOUNTER — Other Ambulatory Visit: Payer: Self-pay | Admitting: Internal Medicine

## 2015-12-02 ENCOUNTER — Other Ambulatory Visit: Payer: Self-pay | Admitting: Neurology

## 2015-12-02 LAB — CULTURE, BLOOD (ROUTINE X 2)
CULTURE: NO GROWTH
CULTURE: NO GROWTH

## 2015-12-02 LAB — URINE CULTURE

## 2015-12-02 NOTE — Telephone Encounter (Signed)
RX refill for Aricept sent to pharmacy. Per last note patient to continue.

## 2015-12-07 ENCOUNTER — Ambulatory Visit (INDEPENDENT_AMBULATORY_CARE_PROVIDER_SITE_OTHER): Payer: Medicare HMO | Admitting: Neurology

## 2015-12-07 ENCOUNTER — Encounter: Payer: Self-pay | Admitting: Neurology

## 2015-12-07 VITALS — BP 122/70 | HR 78 | Ht 69.0 in | Wt 166.8 lb

## 2015-12-07 DIAGNOSIS — E785 Hyperlipidemia, unspecified: Secondary | ICD-10-CM

## 2015-12-07 DIAGNOSIS — E118 Type 2 diabetes mellitus with unspecified complications: Secondary | ICD-10-CM | POA: Diagnosis not present

## 2015-12-07 DIAGNOSIS — I6523 Occlusion and stenosis of bilateral carotid arteries: Secondary | ICD-10-CM | POA: Diagnosis not present

## 2015-12-07 DIAGNOSIS — I6330 Cerebral infarction due to thrombosis of unspecified cerebral artery: Secondary | ICD-10-CM | POA: Diagnosis not present

## 2015-12-07 DIAGNOSIS — I10 Essential (primary) hypertension: Secondary | ICD-10-CM

## 2015-12-07 NOTE — Progress Notes (Signed)
STROKE NEUROLOGY FOLLOW UP NOTE  NAME: Chad [name] DOB: 1929/03/02  REASON FOR VISIT: stroke follow up HISTORY FROM: wife and chart  Today we had the pleasure of seeing Chad [name] in follow-up at our Neurology Clinic. Pt was accompanied by wife.   History Summary Chad [name] is a 82 y.o. male with history of COPD, diabetes mellitus, hypertension, renal disease, hyperlipidemia,  previous stroke admitted on 06/01/15 for pneumonia and respiratory failure. Intubated and admitted to MICU. He self-extubated and did not require reintubation. However found to be leaning over to the left and have left arm and wrist kept in flexed position. MRI brain showed right parietal, right occipital, right posterior temporal and left parietal lobes. CTA head and neck showed right ICA 65% stenosis, and right VA chronic occlusion. EF 65-70%, LDL 95 and A1C 8.3. His stroke more concerning for embolic pattern vs. Watershed with low BP during intubation and respiratory distress. However, recommend 30 day cardiac monitoring as outpt and follow up with VVS as outpt. Continued on ASA 81 and zocor on discharge to CIR.   08/19/15 follow up - the patient has been doing better.  Had VVS consult with Dr. Donnetta Hutching in 06/2015, not able to attribute current strokes to right ICA stenosis as it more embolic pattern vs. Watershed. Dr. Donnetta Hutching offered continue medical management at this time. 30 day cardiac monitoring has not done yet. Left arm weakness much improved. Glucose controlled better and BP 134/67. Wife concerns lethargy and fatigue at home.   Interval History During the interval time, pt has been doing stable. No recurrent stroke like symptoms. BP stable at home 120s to 130s and today 122/70. Glucose at home also stable, recent A1C 7.3. Has not seen Dr. Donnetta Hutching yet, but has appointment at the end of this month. Not use any device for walking at home, without falls.   REVIEW OF SYSTEMS: Full 14 system review of  systems performed and notable only for those listed below and in HPI above, all others are negative:  Constitutional:  Cardiovascular:  Ear/Nose/Throat:  Hearing loss, runny nose Skin:  Eyes:   Respiratory:   Gastroitestinal:  Incontinence of bowels Genitourinary: Hematology/Lymphatic:   Endocrine: cold intolerance Musculoskeletal:  Joint pain Allergy/Immunology:   Neurological:  Memory loss, dizziness Psychiatric:   Sleep: daytime sleepiness, sleep talking  The following represents the patient's updated allergies and side effects list: Allergies  Allergen Reactions  . Moxifloxacin Anaphylaxis    Avelox REACTION: anaphylaxis  . Quinolones Anaphylaxis  . Sulfonamide Derivatives Hives  . Timolol Maleate Other (See Comments)    Causes severe chest congestion  . Sulfa Antibiotics Rash    The neurologically relevant items on the patient's problem list were reviewed on today's visit.  Neurologic Examination  A problem focused neurological exam (12 or more points of the single system neurologic examination, vital signs counts as 1 point, cranial nerves count for 8 points) was performed.  Blood pressure 122/70, pulse 78, height 5\' 9"  (1.753 m), weight 166 lb 12.8 oz (75.7 kg).  General - Well nourished, well developed, in no apparent distress.  Ophthalmologic - SFundi not visualized due to light sensitivity.  Cardiovascular - Regular rate and rhythm with no murmur.  Mental Status -  Level of arousal and orientation to person and place were intact, not orientated to time and situation. Language including expression, naming, repetition, comprehension was assessed and found intact, but paucity of speech.  Cranial Nerves II - XII - II -  Visual field intact OU. III, IV, VI - Extraocular movements intact. V - Facial sensation intact bilaterally. VII - Facial movement intact bilaterally. VIII - Hearing & vestibular intact bilaterally. X - Palate elevates symmetrically. XI -  Chin turning & shoulder shrug intact bilaterally. XII - Tongue protrusion intact.  Motor Strength - The patient's strength was normal in all extremities and pronator drift was absent.  Bulk was normal and fasciculations were absent.   Motor Tone - Muscle tone was assessed at the neck and appendages and was normal.  Reflexes - The patient's reflexes were 1+ in all extremities and he had no pathological reflexes.  Sensory - Light touch, temperature/pinprick were assessed and were normal.    Coordination - The patient had normal movements in the hands, but left FTN with subtle ataxia or dysmetria.  Tremor was absent.  Gait and Station - walk without device, slow with small stride, broad based gait and decreased arm swing, en bloc turns.   Data reviewed: I personally reviewed the images and agree with the radiology interpretations.  Mr Brain Wo Contrast 06/04/2015   Acute infarcts in the right parietal lobe. Small areas of acute infarct in the right occipital lobe, right posterior temporal lobe, and left parietal white matter. Advanced atrophy.    CT Angio Head and Neck  06/05/2015 1. Negative for emergent large vessel occlusion associated with the acute right cerebral infarcts. There is a paucity of posterior right MCA M3 branches, but no M2 branch occlusion identified. 2. Complex atherosclerotic plaque at the right ICA origin and bulb with some ulceration. Distal bulb level stenosis which might be hemodynamically significant (estimated at up to 65%). Correlation with right side carotid Doppler ultrasound may be valuable. 3. Previous left carotid surgery. Calcified plaque but no stenosis. 4. 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 5. Dominant appearing left vertebral artery with calcified plaque and mild stenosis at its origin. Otherwise negative posterior circulation. 6.  Expected evolution of the larger ischemic foci demonstrated yesterday by MRI. No associated hemorrhage or mass effect. 7. Patulous thoracic esophagus containing fluid/debris. Consider achalasia. 8. Advanced cervical spine degeneration. Areas of ankylosis in the upper thoracic spine.  LE venous doppler - - No evidence of deep vein or superficial thrombosis involving the  right lower extremity and left lower extremity. - No evidence of Baker&'s cyst on the right or left.  TTE  Study Conclusions - Left ventricle: The cavity size was normal. Wall thickness was  increased in a pattern of mild LVH. Systolic function was  vigorous. The estimated ejection fraction was in the range of 65%  to 70%. Wall motion was normal; there were no regional wall  motion abnormalities. Doppler parameters are consistent with  abnormal left ventricular relaxation (grade 1 diastolic  dysfunction). - Aortic valve: There was no stenosis. - Mitral valve: There was no significant regurgitation. - Right ventricle: The cavity size was normal. Systolic function  was normal. - Tricuspid valve: Peak RV-RA gradient (S): 41 mm Hg. - Pulmonary arteries: PA peak pressure: 44 mm Hg (S). - Inferior vena cava: The vessel was normal in size. The  respirophasic diameter changes were in the normal range (>= 50%),  consistent with normal central venous pressure. Impressions: - Normal LV size with mild LV hypertrophy. EF 65-70%. Normal RV  size and systolic function. Mild pulmonary hypertension.  30 day cardiac event monitoring - negative for afib  Component     Latest Ref Rng & Units 06/05/2015  Cholesterol     0 - 200 mg/dL 162  Triglycerides     <150 mg/dL 106  HDL Cholesterol     >40 mg/dL 46  Total CHOL/HDL Ratio     RATIO 3.5  VLDL     0 - 40 mg/dL 21  LDL (calc)     0 - 99 mg/dL 95  Hemoglobin A1C     4.8 - 5.6 % 8.3 (H)  Mean Plasma Glucose     mg/dL 192  TSH     0.350 - 4.500 uIU/mL 1.326    Vitamin B12     180 - 914 pg/mL 2,099 (H)    Assessment: As you may recall, he is a 82 y.o. Caucasian male with PMH of COPD, diabetes mellitus, hypertension, renal disease, hyperlipidemia, carotid CEA b/l in 1999, previous stroke admitted on 06/01/15 for pneumonia and respiratory failure. Intubated and admitted to MICU. He self-extubated and did not require reintubation. However found to be leaning over to the left and have left arm and wrist kept in flexed position. MRI brain showed right parietal, right occipital, right posterior temporal and left parietal lobes. CTA head and neck showed right ICA 65% stenosis, and right VA chronic occlusion. EF 65-70%, LDL 95 and A1C 8.3. His stroke more concerning for embolic pattern vs. Watershed with low BP during intubation and respiratory distress.  Continued on ASA 81 and zocor on discharge to CIR. During the interval time, the patient has been doing better. 30 day cardiac monitoring showed no afib. Had [facility]VS consult with Dr. Donnetta Hutching in 06/2015, not able to attribute current strokes to right ICA stenosis as it more embolic pattern vs. Watershed. Dr. Donnetta Hutching offered continue medical management at this time. Left arm weakness much improved. Switched from ASA to plavix.  Plan:  - continue plavix and zocore for stroke prevention - follow up with Dr. Donnetta Hutching for right carotid stenosis - Follow up with your primary care physician for stroke risk factor modification. Recommend maintain blood pressure goal <130/80, diabetes with hemoglobin A1c goal below 7.0% and lipids with LDL cholesterol goal below 70 mg/dL.  - check BP and glucose at home and record. BP goal 120-140 - avoid fall, if needed, use walker or cane - diabetic diet and home exercise - follow up with Dr. Delice Lesch in [facility] neurology  I spent more than 25 minutes of face to face time with the patient. Greater than 50% of time was spent in counseling and coordination of care. We discussed continued BP and glucose  control, and follow up with VVS, and avoid falls at home.    No orders of the defined types were placed in this encounter.   No orders of the defined types were placed in this encounter.   Patient Instructions  - continue plavix and zocore for stroke prevention - follow up with Dr. Donnetta Hutching for right carotid stenosis - Follow up with your primary care physician for stroke risk factor modification. Recommend maintain blood pressure goal <130/80, diabetes with hemoglobin A1c goal below 7.0% and lipids with LDL cholesterol goal below 70 mg/dL.  - check BP and glucose at home and record. BP goal 120-140 - avoid fall, if needed, use walker or cane - diabetic diet and home exercise - follow up with Dr. Delice Lesch in [facility] neurology   Rosalin Hawking, MD PhD [facility] [address], [place], [place] [address] [phone]

## 2015-12-07 NOTE — Patient Instructions (Signed)
-   continue plavix and zocore for stroke prevention - follow up with Dr. Donnetta Hutching for right carotid stenosis - Follow up with your primary care physician for stroke risk factor modification. Recommend maintain blood pressure goal <130/80, diabetes with hemoglobin A1c goal below 7.0% and lipids with LDL cholesterol goal below 70 mg/dL.  - check BP and glucose at home and record. BP goal 120-140 - avoid fall, if needed, use walker or cane - diabetic diet and home exercise - follow up with Dr. Delice Lesch in [facility] neurology

## 2015-12-16 ENCOUNTER — Encounter (HOSPITAL_COMMUNITY): Payer: Self-pay | Admitting: Emergency Medicine

## 2015-12-16 ENCOUNTER — Encounter: Payer: Self-pay | Admitting: Internal Medicine

## 2015-12-16 ENCOUNTER — Telehealth: Payer: Self-pay | Admitting: Internal Medicine

## 2015-12-16 ENCOUNTER — Ambulatory Visit (INDEPENDENT_AMBULATORY_CARE_PROVIDER_SITE_OTHER): Payer: Medicare HMO | Admitting: Internal Medicine

## 2015-12-16 ENCOUNTER — Ambulatory Visit (INDEPENDENT_AMBULATORY_CARE_PROVIDER_SITE_OTHER)
Admission: RE | Admit: 2015-12-16 | Discharge: 2015-12-16 | Disposition: A | Discharge status: Home / Self Care | Payer: Medicare HMO | Source: Ambulatory Visit | Attending: Internal Medicine | Admitting: Internal Medicine

## 2015-12-16 ENCOUNTER — Emergency Department (HOSPITAL_COMMUNITY)
Admission: EM | Admit: 2015-12-16 | Discharge: 2015-12-16 | Disposition: A | Discharge status: Home / Self Care | Payer: Medicare HMO | Attending: Emergency Medicine | Admitting: Emergency Medicine

## 2015-12-16 VITALS — BP 122/60 | HR 78 | Ht 69.0 in | Wt 164.0 lb

## 2015-12-16 DIAGNOSIS — E119 Type 2 diabetes mellitus without complications: Secondary | ICD-10-CM | POA: Diagnosis not present

## 2015-12-16 DIAGNOSIS — Z8673 Personal history of transient ischemic attack (TIA), and cerebral infarction without residual deficits: Secondary | ICD-10-CM | POA: Insufficient documentation

## 2015-12-16 DIAGNOSIS — Z79899 Other long term (current) drug therapy: Secondary | ICD-10-CM | POA: Diagnosis not present

## 2015-12-16 DIAGNOSIS — Z87891 Personal history of nicotine dependence: Secondary | ICD-10-CM | POA: Insufficient documentation

## 2015-12-16 DIAGNOSIS — T887XXA Unspecified adverse effect of drug or medicament, initial encounter: Secondary | ICD-10-CM | POA: Insufficient documentation

## 2015-12-16 DIAGNOSIS — Y829 Unspecified medical devices associated with adverse incidents: Secondary | ICD-10-CM | POA: Insufficient documentation

## 2015-12-16 DIAGNOSIS — J449 Chronic obstructive pulmonary disease, unspecified: Secondary | ICD-10-CM

## 2015-12-16 DIAGNOSIS — T7840XA Allergy, unspecified, initial encounter: Secondary | ICD-10-CM

## 2015-12-16 DIAGNOSIS — I10 Essential (primary) hypertension: Secondary | ICD-10-CM | POA: Insufficient documentation

## 2015-12-16 DIAGNOSIS — T360X5A Adverse effect of penicillins, initial encounter: Secondary | ICD-10-CM | POA: Diagnosis present

## 2015-12-16 DIAGNOSIS — T782XXA Anaphylactic shock, unspecified, initial encounter: Secondary | ICD-10-CM | POA: Diagnosis not present

## 2015-12-16 DIAGNOSIS — J189 Pneumonia, unspecified organism: Secondary | ICD-10-CM

## 2015-12-16 DIAGNOSIS — Z7984 Long term (current) use of oral hypoglycemic drugs: Secondary | ICD-10-CM | POA: Insufficient documentation

## 2015-12-16 LAB — COMPREHENSIVE METABOLIC PANEL
ALT: 17 U/L (ref 17–63)
AST: 22 U/L (ref 15–41)
Albumin: 3.8 g/dL (ref 3.5–5.0)
Alkaline Phosphatase: 65 U/L (ref 38–126)
Anion gap: 13 (ref 5–15)
BUN: 29 mg/dL — AB (ref 6–20)
CHLORIDE: 108 mmol/L (ref 101–111)
CO2: 21 mmol/L — AB (ref 22–32)
CREATININE: 1.58 mg/dL — AB (ref 0.61–1.24)
Calcium: 9.8 mg/dL (ref 8.9–10.3)
GFR calc Af Amer: 45 mL/min — ABNORMAL LOW (ref >60)
GFR calc non Af Amer: 38 mL/min — ABNORMAL LOW (ref >60)
Glucose, Bld: 99 mg/dL (ref 65–99)
Potassium: 4.4 mmol/L (ref 3.5–5.1)
SODIUM: 142 mmol/L (ref 135–145)
Total Bilirubin: 0.5 mg/dL (ref 0.3–1.2)
Total Protein: 6.5 g/dL (ref 6.5–8.1)

## 2015-12-16 LAB — CBC WITH DIFFERENTIAL/PLATELET
Basophils Absolute: 0 10*3/uL (ref 0.0–0.1)
Basophils Relative: 0 %
EOS ABS: 0.1 10*3/uL (ref 0.0–0.7)
EOS PCT: 1 %
HCT: 40.8 % (ref 39.0–52.0)
Hemoglobin: 12.9 g/dL — ABNORMAL LOW (ref 13.0–17.0)
Lymphocytes Relative: 8 %
Lymphs Abs: 1.2 10*3/uL (ref 0.7–4.0)
MCH: 28 pg (ref 26.0–34.0)
MCHC: 31.6 g/dL (ref 30.0–36.0)
MCV: 88.7 fL (ref 78.0–100.0)
MONO ABS: 1.6 10*3/uL — AB (ref 0.1–1.0)
Monocytes Relative: 11 %
NEUTROS PCT: 80 %
Neutro Abs: 11.8 10*3/uL — ABNORMAL HIGH (ref 1.7–7.7)
PLATELETS: 200 10*3/uL (ref 150–400)
RBC: 4.6 MIL/uL (ref 4.22–5.81)
RDW: 17.3 % — ABNORMAL HIGH (ref 11.5–15.5)
WBC: 14.7 10*3/uL — AB (ref 4.0–10.5)

## 2015-12-16 MED ORDER — HYDROCORTISONE NA SUCCINATE PF 100 MG IJ SOLR
100.0000 mg | Freq: Every day | INTRAMUSCULAR | Status: DC
  Administered 2015-12-16: 100 mg via INTRAVENOUS
  Filled 2015-12-16: qty 2

## 2015-12-16 MED ORDER — PREDNISONE 10 MG PO TABS: 40.0000 mg | ORAL_TABLET | Freq: Every day | ORAL | 0 refills | Status: AC

## 2015-12-16 MED ORDER — DIPHENHYDRAMINE HCL 50 MG/ML IJ SOLN
25.0000 mg | Freq: Once | INTRAMUSCULAR | Status: AC
  Administered 2015-12-16: 25 mg via INTRAVENOUS
  Filled 2015-12-16: qty 1

## 2015-12-16 MED ORDER — AMOXICILLIN-POT CLAVULANATE 875-125 MG PO TABS: 1.0000 | ORAL_TABLET | Freq: Two times a day (BID) | ORAL | 0 refills | Status: AC

## 2015-12-16 MED ORDER — AZITHROMYCIN 250 MG PO TABS
500.0000 mg | ORAL_TABLET | Freq: Once | ORAL | Status: AC
  Administered 2015-12-16: 500 mg via ORAL
  Filled 2015-12-16: qty 2

## 2015-12-16 MED ORDER — AZITHROMYCIN 250 MG PO TABS: 250.0000 mg | ORAL_TABLET | Freq: Every day | ORAL | 0 refills | Status: AC

## 2015-12-16 MED ORDER — FAMOTIDINE IN NACL 20-0.9 MG/50ML-% IV SOLN
20.0000 mg | Freq: Once | INTRAVENOUS | Status: AC
  Administered 2015-12-16: 20 mg via INTRAVENOUS
  Filled 2015-12-16: qty 50

## 2015-12-16 NOTE — Assessment & Plan Note (Signed)
See admit 11/27/15 with Pos strep antigen rx omnicef but now sputum turning purulent again s obvious abn on cxr or sinus CT  So rx with augmentn x 10 days and work harder to improve mucociliary clearance ie max mucinex/ better saba and continue flutter   I had an extended discussion with the patient reviewing all relevant studies completed to date and  lasting 15 to 20 minutes of a 25 minute visit    Each maintenance medication was reviewed in detail including most importantly the difference between maintenance and prns and under what circumstances the prns are to be triggered using an action plan format that is not reflected in the computer generated alphabetically organized AVS.    Please see instructions for details which were reviewed in writing and the patient given a copy highlighting the part that I personally wrote and discussed at today's ov.

## 2015-12-16 NOTE — ED Provider Notes (Signed)
[facility] DEPT Provider Note   CSN: [id number] Arrival date & time: 12/16/15  1612     History   Chief Complaint Chief Complaint  Patient presents with  . Allergic Reaction    HPI Chad [name] is a 82 y.o. male.   Allergic Reaction  Presenting symptoms: rash and wheezing   Presenting symptoms: no difficulty breathing and no difficulty swallowing   Severity:  Moderate Prior allergic episodes:  Allergies to medications Context: medications (amoxicillin)   Relieved by:  Bronchodilators Worsened by:  Nothing Ineffective treatments:  None tried   Past Medical History:  Diagnosis Date  . Acid reflux disease   . Arthritis    "all over"  . Arthritis   . Barrett's esophagus   . Basal cell carcinoma of nose   . CAROTID ARTERY STENOSIS, BILATERAL 08/27/2008  . Chronic bronchitis ([place])    "most q yr; he's had it several times" (12/20/2013)  . COPD (chronic obstructive pulmonary disease) ([place])    "severe" (12/20/2013)  . COPD (chronic obstructive pulmonary disease) ([place])   . Coronary artery stenosis   . Diabetes mellitus without complication ([place])   . Diaphragmatic hernia without mention of obstruction or gangrene   . Diverticulitis   . Diverticulosis of colon   . Dyspnea   . Esophageal stenosis   . Fatty liver disease, nonalcoholic   . GERD (gastroesophageal reflux disease)   . Glaucoma   . Glaucoma   . High cholesterol   . History of blood transfusion 2007   "related to OR"  . History of hiatal hernia    "repaired when esophagus removed"  . History of stomach ulcers   . HTN (hypertension)   . Hyperlipidemia   . Hypertension   . Kidney stones    "passed them all" (12/20/2013)  . Memory loss    DEMENTIA  . Personal history of other malignant neoplasm of skin   . Pneumonia 1990's X 4  . RENAL CALCULUS, HX OF 08/28/2008  . Renal disorder   . Sinus headache    "seasonal"  . Stroke ([place])   . TIA (transient ischemic attack) 1998 X "a few"  . Type II  diabetes mellitus [facility])     Patient Active Problem List   Diagnosis Date Noted  . CAP (community acquired pneumonia) 11/27/2015  . Diabetes mellitus without complication ([place])   . Stroke ([place])   . Hypertension   . Hyperlipidemia   . Weakness   . Pseudobulbar affect 07/02/2015  . Controlled diabetes mellitus type 2 with complications ([place]) [id number]  . GERD without esophagitis 06/12/2015  . Cerebral thrombosis with cerebral infarction 06/05/2015  . HCAP (healthcare-associated pneumonia) 06/02/2015  . Urinary incontinence 04/13/2015  . Moderate dementia with behavioral disturbance 03/09/2015  . Depression 03/09/2015  . Legal blindness [place] 08/07/2014  . Routine general medical examination at a health care facility 04/24/2014  . Carotid stenosis 02/24/2014  . Barrett's esophagus 10/11/2011  . FATTY LIVER DISEASE 08/27/2008  . HLD (hyperlipidemia) 05/20/2008  . Glaucoma 05/20/2008  . Essential hypertension 05/20/2008  . COPD GOLD II  05/20/2008  . ARTHRITIS 05/20/2008    Past Surgical History:  Procedure Laterality Date  . BASAL CELL CARCINOMA EXCISION    . CAROTID ARTERY ANGIOPLASTY    . CAROTID ENDARTERECTOMY Bilateral 02/1996-03/1996  . CATARACT EXTRACTION W/ INTRAOCULAR LENS IMPLANT Bilateral 04/2007 - 09/2007   "left-right"  . ESOPHAGECTOMY  2007   with stomach pull through  . ESOPHAGOGASTRODUODENOSCOPY (EGD) WITH ESOPHAGEAL DILATION  10/2011  .  EYE SURGERY Right 04/2006; 08/2007   "had gas bubbles put in"  . EYE SURGERY Right 01/2007   "air bubble"  . EYE SURGERY Left 12/12/2013   "cleaned his implant w/laser"  . EYE SURGERY    . GLAUCOMA SURGERY Right 11/2008   "implanted drain tube"  . HERNIA REPAIR    . KNEE ARTHROSCOPY Bilateral 04/1989; 08/1996; 06/1999   "right; left; left"  . KNEE SURGERY    . MOHS SURGERY  ?2002   "tip of nose; basal cell"  . NASAL POLYP EXCISION  2005   "benign"  . PARS PLANA VITRECTOMY Bilateral 01/2007-05/2007  . SHOULDER ARTHROSCOPY Right  04/1998  . SHOULDER SURGERY    . VENTRAL HERNIA REPAIR  02/2006       Home Medications    Prior to Admission medications   Medication Sig Start Date End Date Taking? Authorizing Provider  acetaminophen (TYLENOL) 500 MG tablet Take 1,000 mg by mouth every 6 (six) hours as needed for fever.   Yes Historical Provider, MD  albuterol (PROVENTIL HFA;VENTOLIN HFA) 108 (90 Base) MCG/ACT inhaler Inhale 2 puffs into the lungs every 4 (four) hours as needed for wheezing or shortness of breath.   Yes Historical Provider, MD  albuterol (PROVENTIL) (2.5 MG/3ML) 0.083% nebulizer solution Take 2.5 mg by nebulization every 4 (four) hours as needed for wheezing or shortness of breath.   Yes Historical Provider, MD  amLODipine (NORVASC) 10 MG tablet Take 1 tablet (10 mg total) by mouth daily. 08/27/15  Yes Hoyt Koch, MD  brimonidine (ALPHAGAN) 0.15 % ophthalmic solution Place 1 drop into the left eye 2 (two) times daily. 06/09/15  Yes Bonnell Public, MD  budesonide-formoterol [facility]) 160-4.5 MCG/ACT inhaler Inhale 2 puffs into the lungs 2 (two) times daily.   Yes Historical Provider, MD  Cholecalciferol (VITAMIN D) 1000 UNITS capsule Take 1,000 Units by mouth daily.     Yes Historical Provider, MD  cloNIDine (CATAPRES) 0.2 MG tablet Take 0.05-0.1 mg by mouth daily as needed (for SBP >140). Only take 1/2 tablet if systolic is over [id number].    Yes Historical Provider, MD  clopidogrel (PLAVIX) 75 MG tablet TAKE 1 TABLET BY MOUTH EVERY DAY 08/20/15  Yes Rosalin Hawking, MD  donepezil (ARICEPT) 10 MG tablet Take 10 mg by mouth at bedtime.   Yes Historical Provider, MD  LORazepam (ATIVAN) 0.5 MG tablet Take 0.5 mg by mouth at bedtime.    Yes Historical Provider, MD  losartan (COZAAR) 100 MG tablet TAKE 1 TABLET(100 MG) BY MOUTH DAILY 11/02/15  Yes Hoyt Koch, MD  metFORMIN (GLUCOPHAGE) 500 MG tablet Take 500 mg by mouth 2 (two) times daily with a meal.    Yes Historical Provider, MD  mirtazapine  (REMERON) 7.5 MG tablet Take 15 mg by mouth at bedtime.    Yes Historical Provider, MD  Multiple Vitamins-Minerals (PRESERVISION AREDS 2) CAPS Take 1 capsule by mouth 2 (two) times daily.   Yes Historical Provider, MD  omeprazole (PRILOSEC) 20 MG capsule Take 20 mg by mouth 2 (two) times daily before a meal.    Yes Historical Provider, MD  potassium chloride SA (K-DUR,KLOR-CON) 20 MEQ tablet TAKE 1 TABLET(20 MEQ) BY MOUTH DAILY 12/02/15  Yes Hoyt Koch, MD  simvastatin (ZOCOR) 20 MG tablet TAKE 1 TABLET BY MOUTH DAILY AT 6 PM 10/30/15  Yes Hoyt Koch, MD  vitamin B-12 (CYANOCOBALAMIN) 1000 MCG tablet Take 1,000 mcg by mouth daily.   Yes Historical Provider, MD  amoxicillin-clavulanate (AUGMENTIN) 875-125 MG tablet Take 1 tablet by mouth 2 (two) times daily. Patient not taking: Reported on 12/16/2015 12/16/15 12/26/15  Tanda Rockers, MD  azithromycin (ZITHROMAX) 250 MG tablet Take 1 tablet (250 mg total) by mouth daily. Take first 2 tablets together, then 1 every day until finished. 12/16/15 12/20/15  Dewaine Conger, MD  fluticasone (FLONASE) 50 MCG/ACT nasal spray Place 2 sprays into both nostrils 2 (two) times daily as needed for allergies.     Historical Provider, MD  predniSONE (DELTASONE) 10 MG tablet Take 4 tablets (40 mg total) by mouth daily. 12/16/15 12/20/15  Dewaine Conger, MD    Family History Family History  Problem Relation Age of Onset  . Colon cancer Mother   . Lung cancer Mother   . Heart attack Mother   . Stomach cancer Father   . Esophageal cancer Father   . Colon cancer Maternal Grandfather   . Heart attack Brother   . Heart attack Brother     Social History Social History  Substance Use Topics  . Smoking status: Former Smoker    Packs/day: 1.00    Years: 40.00    Types: Cigarettes    Quit date: 01/24/1978  . Smokeless tobacco: Never Used  . Alcohol use No     Allergies   Moxifloxacin; Quinolones; Sulfonamide derivatives; Timolol maleate; Amoxicillin;  Augmentin [amoxicillin-pot clavulanate]; and Sulfa antibiotics   Review of Systems Review of Systems  Constitutional: Negative for chills and fever.  HENT: Negative for ear pain, sore throat and trouble swallowing.   Eyes: Negative for pain and visual disturbance.  Respiratory: Positive for wheezing. Negative for cough and shortness of breath.   Cardiovascular: Negative for chest pain and palpitations.  Gastrointestinal: Positive for nausea and vomiting. Negative for abdominal pain.  Genitourinary: Negative for dysuria and hematuria.  Musculoskeletal: Negative for arthralgias and back pain.  Skin: Positive for rash. Negative for color change.  Neurological: Negative for seizures and syncope.  All other systems reviewed and are negative.    Physical Exam Updated Vital Signs BP 120/70   Pulse 90   Temp 97.5 F (36.4 C) (Oral)   Resp 24   SpO2 98%   Physical Exam  Constitutional: He appears well-developed and well-nourished.  HENT:  Head: Normocephalic and atraumatic.  Mouth/Throat: Uvula is midline, oropharynx is clear and moist and mucous membranes are normal. No uvula swelling. No posterior oropharyngeal edema.  Eyes: Conjunctivae are normal.  Neck: Normal range of motion. Neck supple. No tracheal deviation present.  Cardiovascular: Normal rate and regular rhythm.   No murmur heard. Pulmonary/Chest: Effort normal. No stridor. No respiratory distress. He has wheezes (mild end exp).  Abdominal: Soft. There is no tenderness.  Musculoskeletal: He exhibits no edema.  Neurological: He is alert.  Skin: Skin is warm and dry.  Psychiatric: He has a normal mood and affect.  Nursing note and vitals reviewed.    ED Treatments / Results  Labs (all labs ordered are listed, but only abnormal results are displayed) Labs Reviewed  CBC WITH DIFFERENTIAL/PLATELET - Abnormal; Notable for the following:       Result Value   WBC 14.7 (*)    Hemoglobin 12.9 (*)    RDW 17.3 (*)     Neutro Abs 11.8 (*)    Monocytes Absolute 1.6 (*)    All other components within normal limits  COMPREHENSIVE METABOLIC PANEL - Abnormal; Notable for the following:    CO2 21 (*)    BUN  29 (*)    Creatinine, Ser 1.58 (*)    GFR calc non Af Amer 38 (*)    GFR calc Af Amer 45 (*)    All other components within normal limits    EKG  EKG Interpretation None       Radiology Dg Chest 2 View  Result Date: 12/16/2015 CLINICAL DATA:  82 year old male with a history of pneumonia follow-up EXAM: CHEST  2 VIEW COMPARISON:  10/30/2015 FINDINGS: Cardiomediastinal silhouette unchanged. Double density over the lower mediastinum on the anterior and lateral view, unchanged. No confluent airspace disease or pneumothorax. Calcifications of the aortic arch. No displaced fracture. IMPRESSION: No radiographic evidence of acute cardiopulmonary disease. Re- demonstration of large hiatal hernia. Aortic atherosclerosis. Signed, Dulcy Fanny. Earleen Newport, DO [facility] Radiology Electronically Signed   By: Corrie Mckusick D.O.   On: 12/16/2015 12:44    Procedures Procedures (including critical care time)  Medications Ordered in ED Medications  famotidine (PEPCID) IVPB 20 mg premix (0 mg Intravenous Stopped 12/16/15 1730)  diphenhydrAMINE (BENADRYL) injection 25 mg (25 mg Intravenous Given 12/16/15 1700)  azithromycin (ZITHROMAX) tablet 500 mg (500 mg Oral Given 12/16/15 2021)     Initial Impression / Assessment and Plan / ED Course  I have reviewed the triage vital signs and the nursing notes.  Pertinent labs & imaging results that were available during my care of the patient were reviewed by me and considered in my medical decision making (see chart for details).  Clinical Course     82 year old male was taking amoxicillin for treatment of community acquired pneumonia. He's had amoxicillin before. Shortly after his first dose he started developing nausea  vomiting rash and some tightness in his chest. His doctors gave him bronchodilators and tension the emergency room for evaluation of possible anaphylaxis. He is nauseous and vomiting. 3 organ systems involved breathing, rash, nausea vomiting certainly appears to be anaphylaxis however his vital signs are stable with no hypotension and no tachycardia. He has no swelling in his oropharynx. He does not have any tightness in his throat trouble swallowing course was. He does have some end expiratory wheeze. Patient is given steroids, Pepcid, Benadryl. Patient will be observed. No epi given at this time this patient is 43 has a history of peripheral vascular disease and do not need the stress on his heart at this time.  Ages monitored for several hours. Has much resolution of his symptoms. He's now safe for discharge home. He is DC'd from the Augmentin and started on azithromycin. He is also started on prednisone. Patient has COPD and is likely suffering from a mild COPD exacerbation. Vital signs stable time of discharge. Strict return precautions are given.  Final Clinical Impressions(s) / ED Diagnoses   Final diagnoses:  Allergic reaction, initial encounter  Anaphylaxis, initial encounter    New Prescriptions Discharge Medication List as of 12/16/2015  8:08 PM    START taking these medications   Details  azithromycin (ZITHROMAX) 250 MG tablet Take 1 tablet (250 mg total) by mouth daily. Take first 2 tablets together, then 1 every day until finished., Starting Wed 12/16/2015, Until Sun 12/20/2015, Print    predniSONE (DELTASONE) 10 MG tablet Take 4 tablets (40 mg total) by mouth daily., Starting Wed 12/16/2015, Until Sun 12/20/2015, Print         Dewaine Conger, MD 12/17/15 [id number]    Gareth Morgan, MD 12/18/15 [phone]

## 2015-12-16 NOTE — ED Triage Notes (Signed)
Pt took Amoxicillin at 1445 and began having an allergic reaction. Pt red all over, rash, sweating. Pt's wife reports he vomited and had diarrhea and has been sweating. Pt appears red at triage. Pt states he has generalized pain. Pt's wife gave him a benadryl and pt vomited. Pt's wife also gave him a neb treatment at home because he felt like he couldn't breath. Pt doesn't feel SOB at triage.

## 2015-12-16 NOTE — Progress Notes (Signed)
Subjective:    Patient ID: Chad [name], male    DOB: 1929/02/12    MRN: [id number]  Brief patient profile:  85 yowm quit smoking x 1980 with GOLD II copd by pfts 06/10/08 and freq exac which improve transiently on steroids   History of Present Illness  07/02/2014  Acute ov/[name] re: aecopd/ ab plus ? Pseudoasthma  Chief Complaint  Patient presents with  . Acute Visit    Pt c/o wheezing and cough x 5 days. Cough is non prod. He has also started to have some nasal congestion over the past couple of days. He is using albuterol inhaler approx 3 x per day and has not had to use neb at all.   gradually worse cough/ subj wheeze since last prednisone /win a week or two of ov.  Worse day than noct/ comfortable at rest p saba but use way up over baseline rec Omeprazole Take 30- 60 min before your first and last meals of the day GERD diet  Stop powdered inhalers = spiriva / foradil and start symbicort 160 Take 2 puffs first thing in am and then another 2 puffs about 12 hours later.  Work on inhaler technique:   Only use your albuterol as a rescue medication Only use albuterol nebulizer up to every 4 hours if needed but goal is not need it all  Prednisone 10 mg take  4 each am x 2 days,   2 each am x 2 days,  1 each am x 2 days and stop     01/30/2015 acute extended ov/[name] re: recurrent cough in pt with COPD II  Chief Complaint  Patient presents with  . Acute Visit    Coughing up greenish/yellow mucus. Chest congestion, nasal congestion. Has taken 2 boxes of Mucinex and 1 bottle of liquid Mucinex already.   was better p prev ov then Onset mid November 2016 acute  assoc with stuffy nose  Has flutter not using / poor insight into prns  Cough only  better after hyrdocodone/ no better p prednisone hfa poor  rec When sick with flare of cough/ congestion  mucinex dm 1200 mg every 12 hours and use the flutter as much as possible If having bad cough/ congestion in am > use nebulizer albuterol  first thing in am  Augmentin 875 mg take one pill twice daily  X 10 days - take at breakfast and supper with large glass of water.  It would help reduce the usual side effects (diarrhea and yeast infections) if you ate cultured yogurt at lunch.  Prednisone 10 mg take  4 each am x 2 days,   2 each am x 2 days,  1 each am x 2 days and stop  Work on inhaler technique:  relax and gently blow all the way out then take a nice smooth deep breath back in, triggering the inhaler at same time you start breathing in.  Hold for up to 5 seconds if you can. Blow out thru nose. Rinse and gargle with water when done Please remember to go to the  x-ray department downstairs for your tests - we will call you with the results when they are available. Please schedule a follow up office visit in 6 weeks, call sooner if needed      02/26/2015  f/u ov/[name] re:  COPD II/ maint rx symb 160 2bid and prn saba Chief Complaint  Patient presents with  . Acute Visit    No fever as  of this AM. Denies any chills/sweats/nausea/vomiting. yesterday did cough up green phlem but non today. Had wheezing about 1 day ago.  was fine from 1/6 - 1/24 one episode early am sob > 911 > nl vitals, better s rx  Then 02/25/15 coughed up green for the first time    Changed prilosec to alternative rx 2/1 Rare perceived need for saba / not clear he used action plan  rec Augmentin 875 mg take one pill twice daily  X 10 days  Prilosec 20 mg Take 30- 60 min before your first and last meals of the day  For cough > mucinex dm 1200 up to twice daily and use the flutter as you can  Work on Product manager inhaler technique:        Admit date: 03/11/2015 Discharge date: 03/15/2015     Discharge Diagnoses:  Principal Problem:  Sepsis due to pneumonia [facility]) Active Problems:  HLD (hyperlipidemia)  Essential hypertension  COPD GOLD II   Diabetes mellitus without complication (HCC)  Moderate dementia with behavioral disturbance  Pneumonia   Sepsis ([place])  Acute kidney injury (HCC)> resolved    04/23/2015  f/u ov/[name] re: GOLD II/ maint on symbicort 160 2bid  Chief Complaint  Patient presents with  . Follow-up    Breathing is doing well. No new co's today.   Not limited by breathing from desired activities  But very inactive   rec Please see patient coordinator before you leave today  to schedule ENT eval Dr [name] Try bevespi Take 2 puffs first thing in am and then another 2 puffs about 12 hours later.  Only use your albuterol as a rescue medication     05/25/2015  f/u ov/[name] re: COPD  GOLD II/ maint rx bivespi   Chief Complaint  Patient presents with  . Follow-up    Breathing is overall doing well. Had some wheezing and cough 1 day ago-mucinex has helped.   rarely using any saba hfa/ no neb at all  Doe = MMRC1 = can walk nl pace, flat grade, can't hurry or go uphills or steps s sob   rec  Continue bevespi Take 2 puffs first thing in am and then another 2 puffs about 12 hours later if the [place] doesn't cover it then ok to change back to symbiocort     06/01/2015 NP/ Acute OV  Pt presents for an acute office visit.  Complains of wheezing, SOB, prod cough with clear mucus, chest tightness/congestion starting on 05/29/15. Denies any sinus congestion/drainage, fever, nausea or vomiting. Called EMS last pm, was given Neb tx. Felt better and declined hospital transport.  Feels some better this am but coughing up thick mucus, and having wheezing .  CXR 5/1 w/ COPD changes, nad.  rec Augmentin 875mg  Twice daily  , take w/ food.  Prednisone taper over next week.  Mucinex DM Twice daily  As needed  Cough/congestion .    06/08/15 ST rec Diet recommendations: Regular;Thin liquid Liquids provided via: Cup   07/13/2015 extended post hosp f/u ov/transition of care/[name] re:  GOLD  II copd/ symbicort 160 2bid Chief Complaint  Patient presents with  . Follow-up    Breathing has improved.   d/c from [facility] x Jun 17 2015 took  another round of augmentin /pred  Cough/congestion Improved mobility since then using  rollator and doing doing better walking hallways and still saba use daily but only p activity/ not noct and no neb (close to baseline) rec Abe People is  a better inhaler than proventil because it's got a counter to keep up with how much is left If starts needing the nebulizer more than usual we need to see him here right away but if it's up to every 4 hours then should go straight to ER     10/13/2015  f/u ov/Devarion Mcclanahan re:  GOLD II copd/ symibicort 160 2bid Chief Complaint  Patient presents with  . Follow-up    Breathing is overall doing well. No new co's today.    walking s [facility] very happy with progress Now doe = .MMRC3 = can't walk 100 yards even at a slow pace at a flat grade s stopping due to sob / no need for 02  And minimal need for saba in any form. rec Plan A = Automatic = symbicort 160 Take 2 puffs first thing in am and then another 2 puffs about 12 hours later.  Plan B = Backup Only use your albuterol (proventil) s a rescue medication  Plan C = Crisis - only use your albuterol nebulizer if you first try Plan B and it fails to help > ok to use the nebulizer up to every 4 hours but if start needing it regularly call for immediate appointment    10/30/2015 acute extended ov/Zehava Turski re:  Aecpd / GOLD II maint rx symbicort 160 2 bid / ppi bid Chief Complaint  Patient presents with  . Acute Visit    Pt c/o cough with white sputum and nasal d/c for the past few days. He has also been wheezing some.    coughs more p eating and esp if  takes large bites   rec Augmentin 875 mg take one pill twice daily  X 10 days - take at breakfast and supper with large glass of water.  It would help reduce the usual side effects (diarrhea and yeast infections) if you ate cultured yogurt at lunch.  Prednisone 10 mg take  4 each am x 2 days,   2 each am x 2 days,  1 each am x 2 days and stop  For cough >  mucinex dm 1200 up to twice daily and use the flutter as you can  Work on Product manager inhaler technique     12/16/2015  f/u ov/Cheris Tweten re: cap s/p 10 day of cef ? Strep CAP (pos Strep urinary antigen/ cultures all neg)  Chief Complaint  Patient presents with  . Hospitalization Follow-up    Breathing is overall doing better. He is sleeping alot and coughing more with large amounts of yellow sputum for the past few days.   fever is gone off  abx but mucus turned back to yellow x 48 h prior to OV  But no sob / using saba hfa but technique poor    No obvious day to day or daytime variability or assocpurulent sputum or mucus plugs or hemoptysis or cp or chest tightness, subjective wheeze or overt   hb symptoms. No unusual exp hx or h/o childhood pna/ asthma or knowledge of premature birth.  Sleeping ok without nocturnal  or early am exacerbation  of respiratory  c/o's or need for noct saba. Also denies any obvious fluctuation of symptoms with weather or environmental changes or other aggravating or alleviating factors except as outlined above   Current Medications, Allergies, Complete Past Medical History, Past Surgical History, Family History, and Social History were reviewed in [employer] record.  ROS  The following are not active  complaints unless bolded sore throat, dysphagia, dental problems, itching, sneezing,  nasal congestion or excess/ purulent secretions, ear ache,   fever, chills, sweats, unintended wt loss, classically pleuritic or exertional cp,  orthopnea pnd or leg swelling, presyncope, palpitations, abdominal pain, anorexia, nausea, vomiting, diarrhea  or change in bowel or bladder habits, change in stools or urine, dysuria,hematuria,  rash, arthralgias, visual complaints, headache, numbness, weakness or ataxia or problems with walking or coordination,  change in mood/affect or memory.                       Objective:   amb stoic wm  nad  / barely able  to get from chair to exam table s one person assist   02/26/2015  171 > 04/23/2015  169 > 05/25/2015   171> 07/13/2015   165 > 10/13/2015  166 >  10/30/2015 167 > 12/16/2015  164      Vital signs reviewed  -  Note sats 95% on arrival RA   HEENT: nl dentition,  and orophanx. Nl external ear canals without cough reflex - edentulous  mp secretions L > R nostrils / severe turbinate swelling    NECK :  without JVD/Nodes/TM/ nl carotid upstrokes bilaterally   LUNGS: no acc muscle use -  Minimal insp and exp rhonchi bilaterally    CV:  RRR  no s3 or murmur or increase in P2, no edema   ABD:  Protuberant but soft and nontender with nl excursion in the supine position. No bruits or organomegaly, bowel sounds nl  MS:  warm without deformities, calf tenderness, cyanosis or clubbing  SKIN: warm and dry without lesions    NEURO:  alert, nad /       CXR PA and Lateral:   12/16/2015 :    I personally reviewed images and agree with radiology impression as follows:   No radiographic evidence of acute cardiopulmonary disease.  Re- demonstration of large hiatal hernia.    CT head 11/27/15 Lobulated paranasal sinus mucosal thickening.   Assessment & Plan:

## 2015-12-16 NOTE — Assessment & Plan Note (Addendum)
-   PFTs 06/10/08  FEV1 1.73 (61%) ratio 55 with ERV 232 and DLCO 87%  - 07/02/2014 try symbicort 160 2bid and stop all powders  - 04/23/2015  > try bevespi less hoarse,some better doe> [facility] covered symbicort    - 12/16/2015  After extensive coaching HFA effectiveness =  50%    Given poor baseline hfa use rec use neb albuterol to backup the symbicort 160 2bid maint

## 2015-12-16 NOTE — Telephone Encounter (Signed)
Wife called -> some 1h ago gav the augmentnig (has had this before but has had antibiotic allergiies of anaphyliactic shiock). Then some 30 min ago he got rash all over, short of breath, nausea, vomit and diarrhea and bad diaphoresis. Now dyspnea is resolved but now shaking, wants to lie down but rash is all over  Advised to go to ER  Dr. Brand Males, M.D., [facility].C.P Pulmonary and Critical Care Medicine Staff Physician [facility] Pager: [phone], If no answer or between  15:00h - 7:00h: call [phone]  12/16/2015 3:55 PM

## 2015-12-16 NOTE — ED Notes (Signed)
Pt verbalized understanding discharge instructions and denies any further needs or questions at this time. VS stable 

## 2015-12-16 NOTE — Patient Instructions (Addendum)
Use nebulizer to back up the symbicort and ok to use the neb up to every 4 hours as needed   Augmentin 875 mg take one pill twice daily  X 10 days - take at breakfast and supper with large glass of water.  It would help reduce the usual side effects (diarrhea and yeast infections) if you ate cultured yogurt at lunch.   Please remember to go to the  x-ray department downstairs for your tests - we will call you with the results when they are available.  Keep appt to see Tammy NP in January  - call sooner if needed

## 2015-12-18 NOTE — Progress Notes (Signed)
Spoke with pt and notified of results per Dr. Wert. Pt verbalized understanding and denied any questions. 

## 2015-12-30 ENCOUNTER — Other Ambulatory Visit: Payer: Self-pay | Admitting: Internal Medicine

## 2016-01-14 ENCOUNTER — Encounter: Payer: Self-pay | Admitting: Vascular Surgery

## 2016-01-19 ENCOUNTER — Encounter (HOSPITAL_COMMUNITY): Payer: Medicare HMO

## 2016-01-19 ENCOUNTER — Ambulatory Visit: Payer: Medicare HMO | Admitting: Vascular Surgery

## 2016-01-25 ENCOUNTER — Other Ambulatory Visit: Payer: Self-pay | Admitting: Internal Medicine

## 2016-01-26 ENCOUNTER — Encounter: Payer: Self-pay | Admitting: Vascular Surgery

## 2016-01-26 ENCOUNTER — Ambulatory Visit (INDEPENDENT_AMBULATORY_CARE_PROVIDER_SITE_OTHER): Payer: Self-pay | Admitting: Vascular Surgery

## 2016-01-26 ENCOUNTER — Ambulatory Visit (HOSPITAL_COMMUNITY)
Admission: RE | Admit: 2016-01-26 | Discharge: 2016-01-26 | Disposition: A | Discharge status: Home / Self Care | Payer: PPO | Source: Ambulatory Visit | Attending: Vascular Surgery | Admitting: Vascular Surgery

## 2016-01-26 VITALS — BP 130/73 | HR 82 | Temp 97.3°F | Resp 16 | Ht 69.0 in | Wt 163.0 lb

## 2016-01-26 DIAGNOSIS — I6523 Occlusion and stenosis of bilateral carotid arteries: Secondary | ICD-10-CM | POA: Insufficient documentation

## 2016-01-26 DIAGNOSIS — I739 Peripheral vascular disease, unspecified: Principal | ICD-10-CM

## 2016-01-26 DIAGNOSIS — I779 Disorder of arteries and arterioles, unspecified: Secondary | ICD-10-CM

## 2016-01-26 NOTE — Progress Notes (Signed)
Vascular and Vein Specialist of [facility]  Patient name: Chad [name] MRN: [id number] DOB: 1929/07/12 Sex: male  REASON FOR VISIT: Follow-up of carotid artery disease.  HPI: Chad [name] is a 83 y.o. male here today for follow-up of carotid artery disease. He continues in failing health. According to his family present he is having increasingly severe memory loss. He also has had multiple episodes of bronchitis and admissions for pneumonia. I'd seen him in May 2017 where he had rest and some right brain event that time. CT angiogram showed widely patent endarterectomies bilaterally with some irregularity in the right carotid bifurcation. He is maintained on Plavix. He has had no new episodes of focal neurologic deficit. Specifically no amaurosis fugax, transient ischemic attack or stroke.  Past Medical History:  Diagnosis Date  . Acid reflux disease   . Arthritis    "all over"  . Arthritis   . Barrett's esophagus   . Basal cell carcinoma of nose   . CAROTID ARTERY STENOSIS, BILATERAL 08/27/2008  . Chronic bronchitis ([place])    "most q yr; he's had it several times" (12/20/2013)  . COPD (chronic obstructive pulmonary disease) ([place])    "severe" (12/20/2013)  . COPD (chronic obstructive pulmonary disease) ([place])   . Coronary artery stenosis   . Diabetes mellitus without complication ([place])   . Diaphragmatic hernia without mention of obstruction or gangrene   . Diverticulitis   . Diverticulosis of colon   . Dyspnea   . Esophageal stenosis   . Fatty liver disease, nonalcoholic   . GERD (gastroesophageal reflux disease)   . Glaucoma   . Glaucoma   . High cholesterol   . History of blood transfusion 2007   "related to OR"  . History of hiatal hernia    "repaired when esophagus removed"  . History of stomach ulcers   . HTN (hypertension)   . Hyperlipidemia   . Hypertension   . Kidney stones    "passed them all" (12/20/2013)  . Memory loss     DEMENTIA  . Personal history of other malignant neoplasm of skin   . Pneumonia 1990's X 4  . RENAL CALCULUS, HX OF 08/28/2008  . Renal disorder   . Sinus headache    "seasonal"  . Stroke ([place])   . TIA (transient ischemic attack) 1998 X "a few"  . Type II diabetes mellitus (HCC)     Family History  Problem Relation Age of Onset  . Colon cancer Mother   . Lung cancer Mother   . Heart attack Mother   . Stomach cancer Father   . Esophageal cancer Father   . Colon cancer Maternal Grandfather   . Heart attack Brother   . Heart attack Brother     SOCIAL HISTORY: Social History  Substance Use Topics  . Smoking status: Former Smoker    Packs/day: 1.00    Years: 40.00    Types: Cigarettes    Quit date: 01/24/1978  . Smokeless tobacco: Never Used  . Alcohol use No    Allergies  Allergen Reactions  . Moxifloxacin Anaphylaxis    Avelox REACTION: anaphylaxis  . Quinolones Anaphylaxis  . Sulfonamide Derivatives Hives  . Timolol Maleate Other (See Comments)    Causes severe chest congestion  . Amoxicillin     Rash, sweating, sob  . Augmentin [Amoxicillin-Pot Clavulanate]     Anaphylaxis   . Sulfa Antibiotics Rash    Current Outpatient Prescriptions  Medication Sig Dispense Refill  .  acetaminophen (TYLENOL) 500 MG tablet Take 1,000 mg by mouth every 6 (six) hours as needed for fever.    Marland Kitchen albuterol (PROVENTIL HFA;VENTOLIN HFA) 108 (90 Base) MCG/ACT inhaler Inhale 2 puffs into the lungs every 4 (four) hours as needed for wheezing or shortness of breath.    Marland Kitchen albuterol (PROVENTIL) (2.5 MG/3ML) 0.083% nebulizer solution Take 2.5 mg by nebulization every 4 (four) hours as needed for wheezing or shortness of breath.    Marland Kitchen amLODipine (NORVASC) 10 MG tablet Take 1 tablet (10 mg total) by mouth daily. 30 tablet 6  . brimonidine (ALPHAGAN) 0.15 % ophthalmic solution Place 1 drop into the left eye 2 (two) times daily. 5 mL 12  . budesonide-formoterol (SYMBICORT) 160-4.5 MCG/ACT inhaler  Inhale 2 puffs into the lungs 2 (two) times daily.    . Cholecalciferol (VITAMIN D) 1000 UNITS capsule Take 1,000 Units by mouth daily.      . cloNIDine (CATAPRES) 0.2 MG tablet Take 0.05-0.1 mg by mouth daily as needed (for SBP >140). Only take 1/2 tablet if systolic is over [id number].     . clopidogrel (PLAVIX) 75 MG tablet TAKE 1 TABLET BY MOUTH EVERY DAY 90 tablet 3  . donepezil (ARICEPT) 10 MG tablet Take 10 mg by mouth at bedtime.    . fluticasone (FLONASE) 50 MCG/ACT nasal spray Place 2 sprays into both nostrils 2 (two) times daily as needed for allergies.     Marland Kitchen LORazepam (ATIVAN) 0.5 MG tablet Take 0.5 mg by mouth at bedtime.     Marland Kitchen losartan (COZAAR) 100 MG tablet TAKE 1 TABLET(100 MG) BY MOUTH DAILY 90 tablet 0  . metFORMIN (GLUCOPHAGE) 500 MG tablet Take 500 mg by mouth 2 (two) times daily with a meal.     . mirtazapine (REMERON) 7.5 MG tablet Take 15 mg by mouth at bedtime.     . Multiple Vitamins-Minerals (PRESERVISION AREDS 2) CAPS Take 1 capsule by mouth 2 (two) times daily.    Marland Kitchen omeprazole (PRILOSEC) 20 MG capsule Take 20 mg by mouth 2 (two) times daily before a meal.     . potassium chloride SA (K-DUR,KLOR-CON) 20 MEQ tablet TAKE 1 TABLET(20 MEQ) BY MOUTH DAILY 30 tablet 0  . simvastatin (ZOCOR) 20 MG tablet TAKE 1 TABLET BY MOUTH DAILY AT 6 PM 90 tablet 0  . vitamin B-12 (CYANOCOBALAMIN) 1000 MCG tablet Take 1,000 mcg by mouth daily.     No current facility-administered medications for this visit.     REVIEW OF SYSTEMS:  [X]  denotes positive finding, [ ]  denotes negative finding Cardiac  Comments:  Chest pain or chest pressure:    Shortness of breath upon exertion: x   Short of breath when lying flat:    Irregular heart rhythm:        Vascular    Pain in calf, thigh, or hip brought on by ambulation:    Pain in feet at night that wakes you up from your sleep:     Blood clot in your veins:    Leg swelling:           PHYSICAL EXAM: Vitals:   01/26/16 1458 01/26/16 1501    BP: (!) 141/80 130/73  Pulse: 82   Resp: 16   Temp: 97.3 F (36.3 C)   TempSrc: Oral   SpO2: 97%   Weight: 163 lb (73.9 kg)   Height: 5\' 9"  (1.753 m)     GENERAL: The patient is a well-nourished male, in no acute distress. The  vital signs are documented above. CARDIOVASCULAR: Well-healed carotid incisions bilaterally with no bruits bilaterally PULMONARY: There is good air exchange  MUSCULOSKELETAL: There are no major deformities or cyanosis. NEUROLOGIC: No focal weakness or paresthesias are detected. SKIN: There are no ulcers or rashes noted. PSYCHIATRIC: The patient has a normal affect.  DATA:  Carotid duplex today revealed widely patent endarterectomies with no evidence of stenosis  MEDICAL ISSUES: Patient is 20 years status post bilateral staged carotid endarterectomies for symptomatic disease. He has had no new neurologic deficits. He did have an event in May at the time of her breast which was in all likelihood a watershed event and not related to carotids. His a duplex continues to show widely patent endarterectomies with no evidence of restenosis. He continues to be in failing health as well. I would not recommend repeat duplex unless he has new deficits. Discussed this with the patient and his family present to agree with this plan. We will see him again on an as-needed basis    Rosetta Posner, MD [facility] Vascular and Vein Specialists of [facility] Tel [phone] Pager [phone]

## 2016-01-27 ENCOUNTER — Other Ambulatory Visit: Payer: Self-pay | Admitting: Internal Medicine

## 2016-01-28 ENCOUNTER — Telehealth: Payer: Self-pay | Admitting: Internal Medicine

## 2016-02-01 ENCOUNTER — Encounter: Payer: Self-pay | Admitting: Adult Health

## 2016-02-01 ENCOUNTER — Other Ambulatory Visit: Payer: Self-pay | Admitting: Internal Medicine

## 2016-02-01 ENCOUNTER — Ambulatory Visit (INDEPENDENT_AMBULATORY_CARE_PROVIDER_SITE_OTHER): Payer: PPO | Admitting: Adult Health

## 2016-02-01 DIAGNOSIS — J449 Chronic obstructive pulmonary disease, unspecified: Secondary | ICD-10-CM | POA: Diagnosis not present

## 2016-02-01 DIAGNOSIS — J189 Pneumonia, unspecified organism: Secondary | ICD-10-CM | POA: Diagnosis not present

## 2016-02-01 MED ORDER — CLONIDINE HCL 0.2 MG PO TABS: 0.1000 mg | ORAL_TABLET | Freq: Every day | ORAL | 0 refills | Status: DC | PRN

## 2016-02-01 MED ORDER — METFORMIN HCL 500 MG PO TABS: 500.0000 mg | ORAL_TABLET | Freq: Two times a day (BID) | ORAL | 3 refills | Status: AC

## 2016-02-01 MED ORDER — LORAZEPAM 0.5 MG PO TABS: 0.5000 mg | ORAL_TABLET | Freq: Every day | ORAL | 3 refills | Status: DC

## 2016-02-01 NOTE — Patient Instructions (Signed)
Follow med calendar closely and bring to each visit .  Continue on current regimen  Follow up with Dr. Melvyn Novas  In 3 months and As needed

## 2016-02-01 NOTE — Telephone Encounter (Signed)
Sent in refills, please fax lorazepam.

## 2016-02-01 NOTE — Assessment & Plan Note (Signed)
Admitted Nov 2017 , follow up cxr last ov showed resolution

## 2016-02-01 NOTE — Assessment & Plan Note (Addendum)
Flare now resolved  Patient's medications were reviewed today and patient education was given. Computerized medication calendar was adjusted/completed    Plan  Patient Instructions  Follow med calendar closely and bring to each visit .  Continue on current regimen  Follow up with Dr. Melvyn Novas  In 3 months and As needed

## 2016-02-01 NOTE — Progress Notes (Signed)
Chart and office note reviewed in detail  > agree with a/p as outlined    

## 2016-02-01 NOTE — Telephone Encounter (Signed)
Pt is needs refill on   Clonidine  Lorazepam Metformin  Pharmacy [facility]

## 2016-02-01 NOTE — Progress Notes (Signed)
@Patient  ID: Chad [name], male    DOB: July 20, 1929, 83 y.o.   MRN: [id number]  Chief Complaint  Patient presents with  . Follow-up    COPD /Med reveiw     Referring provider: Hoyt Koch, *  HPI: 83 yo male former smoker (1980) followed for COPD GOLD II   TEST  - PFTs 06/10/08  FEV1 1.73 (61%) ratio 55 with ERV 232 and DLCO 87%  - 07/02/2014 try symbicort 160 2bid and stop all powders  - 04/23/2015  > try bevespi less hoarse,some better doe> [facility] covered symbicort   02/01/2016 Follow up : COPD /Med Review  Pt returns for 1 month follow up . Last ov with a slow to resolve COPD flare after CAP . CXR shows no residual PNA. Marland Kitchen He was treated with Augmentin unfortunately has reaction .  Was seen in ER for severe allergic reaction to Augmentin, tx w/ steroids, pepcid and benadryl .  He was started on Zpack. Family member with him today says he is doing much better.  Cough is decreased with clear mucus only .  No hemoptysis or wheezing .   We reviewed his meds and organized them into a med calendar with pt education  Appears to be taking correctly . Family member helps with his meds.   Allergies  Allergen Reactions  . Moxifloxacin Anaphylaxis    Avelox REACTION: anaphylaxis  . Quinolones Anaphylaxis  . Sulfonamide Derivatives Hives  . Timolol Maleate Other (See Comments)    Causes severe chest congestion  . Amoxicillin     Rash, sweating, sob  . Augmentin [Amoxicillin-Pot Clavulanate]     Anaphylaxis   . Sulfa Antibiotics Rash    Immunization History  Administered Date(s) Administered  . Influenza Split 12/25/2010, 09/21/2011, 11/24/2012  . Influenza Whole 11/05/2008, 10/28/2009  . Influenza, High Dose Seasonal PF 11/04/2014, 10/13/2015  . Influenza,inj,Quad PF,36+ Mos 10/11/2013  . PPD Test 06/09/2015  . Pneumococcal Conjugate-13 04/02/2013  . Pneumococcal Polysaccharide-23 11/29/2006, 03/17/2009  . Td 11/18/2002  . Tdap 05/08/2013  . Zoster 03/25/2010     Past Medical History:  Diagnosis Date  . Acid reflux disease   . Arthritis    "all over"  . Arthritis   . Barrett's esophagus   . Basal cell carcinoma of nose   . CAROTID ARTERY STENOSIS, BILATERAL 08/27/2008  . Chronic bronchitis ([place])    "most q yr; he's had it several times" (12/20/2013)  . COPD (chronic obstructive pulmonary disease) ([place])    "severe" (12/20/2013)  . COPD (chronic obstructive pulmonary disease) ([place])   . Coronary artery stenosis   . Diabetes mellitus without complication ([place])   . Diaphragmatic hernia without mention of obstruction or gangrene   . Diverticulitis   . Diverticulosis of colon   . Dyspnea   . Esophageal stenosis   . Fatty liver disease, nonalcoholic   . GERD (gastroesophageal reflux disease)   . Glaucoma   . Glaucoma   . High cholesterol   . History of blood transfusion 2007   "related to OR"  . History of hiatal hernia    "repaired when esophagus removed"  . History of stomach ulcers   . HTN (hypertension)   . Hyperlipidemia   . Hypertension   . Kidney stones    "passed them all" (12/20/2013)  . Memory loss    DEMENTIA  . Personal history of other malignant neoplasm of skin   . Pneumonia 1990's X 4  . RENAL CALCULUS, HX OF  08/28/2008  . Renal disorder   . Sinus headache    "seasonal"  . Stroke ([place])   . TIA (transient ischemic attack) 1998 X "a few"  . Type II diabetes mellitus ([facility])     Tobacco History: History  Smoking Status  . Former Smoker  . Packs/day: 1.00  . Years: 40.00  . Types: Cigarettes  . Quit date: 01/24/1978  Smokeless Tobacco  . Never Used   Counseling given: Not Answered   Outpatient Encounter Prescriptions as of 02/01/2016  Medication Sig  . acetaminophen (TYLENOL) 500 MG tablet Take 1,000 mg by mouth every 6 (six) hours as needed for fever.  Marland Kitchen albuterol (PROVENTIL HFA;VENTOLIN HFA) 108 (90 Base) MCG/ACT inhaler Inhale 2 puffs into the lungs every 4 (four) hours as needed for wheezing or shortness of  breath.  Marland Kitchen albuterol (PROVENTIL) (2.5 MG/3ML) 0.083% nebulizer solution Take 2.5 mg by nebulization every 4 (four) hours as needed for wheezing or shortness of breath.  Marland Kitchen amLODipine (NORVASC) 10 MG tablet Take 1 tablet (10 mg total) by mouth daily.  . brimonidine (ALPHAGAN) 0.15 % ophthalmic solution Place 1 drop into the left eye 2 (two) times daily.  . budesonide-formoterol (SYMBICORT) 160-4.5 MCG/ACT inhaler Inhale 2 puffs into the lungs 2 (two) times daily.  . Cholecalciferol (VITAMIN D) 1000 UNITS capsule Take 1,000 Units by mouth daily.    . cloNIDine (CATAPRES) 0.2 MG tablet Take 0.05-0.1 mg by mouth daily as needed (for SBP >140). Only take 1/2 tablet if systolic is over [id number].   . clopidogrel (PLAVIX) 75 MG tablet TAKE 1 TABLET BY MOUTH EVERY DAY  . donepezil (ARICEPT) 10 MG tablet Take 10 mg by mouth at bedtime.  . fluticasone (FLONASE) 50 MCG/ACT nasal spray Place 2 sprays into both nostrils 2 (two) times daily as needed for allergies.   Marland Kitchen LORazepam (ATIVAN) 0.5 MG tablet Take 0.5 mg by mouth at bedtime.   Marland Kitchen losartan (COZAAR) 100 MG tablet Take 1 tablet (100 mg total) by mouth daily.  . metFORMIN (GLUCOPHAGE) 500 MG tablet Take 500 mg by mouth 2 (two) times daily with a meal.   . mirtazapine (REMERON) 7.5 MG tablet Take 15 mg by mouth at bedtime.   . Multiple Vitamins-Minerals (PRESERVISION AREDS 2) CAPS Take 1 capsule by mouth 2 (two) times daily.  Marland Kitchen omeprazole (PRILOSEC) 20 MG capsule Take 20 mg by mouth 2 (two) times daily before a meal.   . potassium chloride SA (K-DUR,KLOR-CON) 20 MEQ tablet Take 1 tablet (20 mEq total) by mouth daily.  . simvastatin (ZOCOR) 20 MG tablet TAKE 1 TABLET BY MOUTH DAILY AT 6 PM  . vitamin B-12 (CYANOCOBALAMIN) 1000 MCG tablet Take 1,000 mcg by mouth daily.   No facility-administered encounter medications on file as of 02/01/2016.      Review of Systems  Constitutional:   No  weight loss, night sweats,  Fevers, chills,  +fatigue, or   lassitude.  HEENT:   No headaches,  Difficulty swallowing,  Tooth/dental problems, or  Sore throat,                No sneezing, itching, ear ache,  +nasal congestion, post nasal drip,   CV:  No chest pain,  Orthopnea, PND, swelling in lower extremities, anasarca, dizziness, palpitations, syncope.   GI  No heartburn, indigestion, abdominal pain, nausea, vomiting, diarrhea, change in bowel habits, loss of appetite, bloody stools.   Resp:    No chest wall deformity  Skin: no rash or lesions.  GU: no dysuria, change in color of urine, no urgency or frequency.  No flank pain, no hematuria   MS:  No joint pain or swelling.  No decreased range of motion.  No back pain.    Physical Exam  BP 108/60   Pulse 72   Temp 97.7 F (36.5 C) (Oral)   Ht 5\' 9"  (1.753 m)   Wt 163 lb 12.8 oz (74.3 kg)   SpO2 98%   BMI 24.19 kg/m   GEN: A/Ox3; pleasant , NAD, elderly    HEENT:  [place]/AT,  EACs-clear, TMs-wnl, NOSE-clear, THROAT-clear, no lesions, no postnasal drip or exudate noted.   NECK:  Supple w/ fair ROM; no JVD; normal carotid impulses w/o bruits; no thyromegaly or nodules palpated; no lymphadenopathy.    RESP  Clear  P & A; w/o, wheezes/ rales/ or rhonchi. no accessory muscle use, no dullness to percussion  CARD:  RRR, no m/r/g, no peripheral edema, pulses intact, no cyanosis or clubbing.  GI:   Soft & nt; nml bowel sounds; no organomegaly or masses detected.   Musco: Warm bil, no deformities or joint swelling noted.   Neuro: alert, no focal deficits noted.    Skin: Warm, no lesions or rashes  Psych:  No change in mood or affect. No depression or anxiety.  No memory loss.  Lab Results:  CBC    Component Value Date/Time   WBC 14.7 (H) 12/16/2015 1738   RBC 4.60 12/16/2015 1738   HGB 12.9 (L) 12/16/2015 1738   HCT 40.8 12/16/2015 1738   PLT 200 12/16/2015 1738   MCV 88.7 12/16/2015 1738   MCH 28.0 12/16/2015 1738   MCHC 31.6 12/16/2015 1738   RDW 17.3 (H) 12/16/2015 1738    LYMPHSABS 1.2 12/16/2015 1738   MONOABS 1.6 (H) 12/16/2015 1738   EOSABS 0.1 12/16/2015 1738   BASOSABS 0.0 12/16/2015 1738    BMET    Component Value Date/Time   NA 142 12/16/2015 1738   NA 142 06/16/2015   K 4.4 12/16/2015 1738   CL 108 12/16/2015 1738   CO2 21 (L) 12/16/2015 1738   GLUCOSE 99 12/16/2015 1738   BUN 29 (H) 12/16/2015 1738   BUN 20 06/16/2015   CREATININE 1.58 (H) 12/16/2015 1738   CALCIUM 9.8 12/16/2015 1738   GFRNONAA 38 (L) 12/16/2015 1738   GFRAA 45 (L) 12/16/2015 1738    BNP    Component Value Date/Time   BNP 174.9 (H) 06/01/2015 1844    ProBNP No results found for: PROBNP  Imaging: No results found.   Assessment & Plan:   COPD GOLD II  Flare now resolved  Patient's medications were reviewed today and patient education was given. Computerized medication calendar was adjusted/completed    Plan  Patient Instructions  Follow med calendar closely and bring to each visit .  Continue on current regimen  Follow up with Dr. Melvyn Novas  In 3 months and As needed       CAP (community acquired pneumonia) Admitted Nov 2017 , follow up cxr last ov showed resolution      Rexene Edison, NP 02/01/2016

## 2016-02-02 NOTE — Telephone Encounter (Signed)
We sent in #30 (60 day supply) on 02/01/2016. Pharmacy is rq to dispense #45 so that pt may have a 90 day supply.   Please advise if you feel this appropriate.

## 2016-02-05 NOTE — Telephone Encounter (Signed)
No, #30 is a 90 day supply. This is a prn medication which should not be used often.

## 2016-02-05 NOTE — Telephone Encounter (Signed)
Contacted patient and awareness was stated and also said he hardly takes it.

## 2016-02-26 ENCOUNTER — Encounter: Payer: Self-pay | Admitting: Neurology

## 2016-02-26 ENCOUNTER — Telehealth: Payer: Self-pay

## 2016-02-26 ENCOUNTER — Ambulatory Visit (INDEPENDENT_AMBULATORY_CARE_PROVIDER_SITE_OTHER): Payer: PPO | Admitting: Neurology

## 2016-02-26 VITALS — BP 136/82 | HR 82 | Ht 69.0 in | Wt 165.4 lb

## 2016-02-26 DIAGNOSIS — I6330 Cerebral infarction due to thrombosis of unspecified cerebral artery: Secondary | ICD-10-CM

## 2016-02-26 DIAGNOSIS — F03B18 Unspecified dementia, moderate, with other behavioral disturbance: Secondary | ICD-10-CM

## 2016-02-26 DIAGNOSIS — F0391 Unspecified dementia with behavioral disturbance: Secondary | ICD-10-CM | POA: Diagnosis not present

## 2016-02-26 MED ORDER — DONEPEZIL HCL 10 MG PO TABS: 10.0000 mg | ORAL_TABLET | Freq: Every day | ORAL | 3 refills | Status: AC

## 2016-02-26 NOTE — Telephone Encounter (Signed)
Patient wife notified . Verbalized understanding .

## 2016-02-26 NOTE — Telephone Encounter (Signed)
Patients wife LVM she thought you were going to send note to PCP to change depression medication but she noticed on AVS that they are to talk to PCP about depression. Please advise.

## 2016-02-26 NOTE — Patient Instructions (Signed)
1. Continue Aricept 10mg  daily 2. Continue all your medications 3. Discuss depression with Dr. Sharlet Salina 4. Continue looking into getting more help at home 5. Follow-up in 1 year, call for any changes

## 2016-02-26 NOTE — Progress Notes (Signed)
NEUROLOGY FOLLOW UP OFFICE NOTE  KAHLER WEHBE [id number]  HISTORY OF PRESENT ILLNESS: I had the pleasure of seeing Thorsen Spiker in follow-up in the neurology clinic on 02/26/2016.  The patient was last seen a year ago for moderate dementia. He is again accompanied by his wife who helps supplement the history today.  Records and images were personally reviewed where available.  He was started on Namzaric on his last visit, then his wife called to report that he was dizzy despite BP issues being resolved. He went back to taking Aricept 10mg  daily. In the interim, he was admitted for pneumonia and respiratory failure last May 2017. During his hospital stay, he was had left-sided weakness and was found to have right parietal, occipital, posterior temporal, and left parietal infarcts. CTA head and neck showed right ICA 65% stenosis and right vertebral artery chronic occlusion. Stroke more concerning for embolic versus watershed pattern. His 30-day holter did not show any atrial fibrillation. He saw stroke neurologist Dr. Erlinda Hong in follow-up with note of left subtle ataxia, he was switched from aspirin to Plavix. He continues to see Dr. Donnetta Hutching for right carotid stenosis. His wife reports he has fallen 4 times since May, mostly in the bathroom. He sits in his recliner most of the time. He needs help with dressing and bathing, but can feed himself. He occasionally gets agitated and looks like he will try to hit her, but has never been abusive. He started crying in the office, his wife reports he would cry when she leaves the house and cannot find her. They stopped the Nudexta due to cost. She denies any hallucinations but states he talks in his sleep.  HPI 03/09/2015: This is an 83 yo RH man with a history of hypertension, diabetes, CAD, esophageal stenosis s/p surgery, with worsening memory. He reports his memory is "fine," but does agree that he forgets names, conversations. His wife reports that he had  esophageal surgery in 2007 for "precancerous cells," then a month later had hernia repair that led to a colectomy. She started noticing memory changes after the first surgery. In 2012, he had bronchitis and apparently had an allergic reaction to Avelox ("turned purple"), and memory changes became even more evident. He repeats the same questions 4 to 5 times. Sometimes he would call for his wife repeatedly, then does not say why. His wife started making sure he takes his medications since 2014, and now fixes his piillbox. He would not eat if she did not remind him to. She has been helping him dress for the past 2 years. He is able to feed himself, but a few hours later forgets he had eaten and would ask again when they would eat. 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 His wife of 23 years has always been in charge of bill payments. She denies any paranoia, but has noticed he gets irritable when she asks him to do something and he just sits there and she tries to help him. There is no family history of dementia. He denies any history of significant head injury, no alcohol intake. He was started on Aricept 5mg  daily in early 2016 with no side effects.   He became tearful at the end of the visit. His wife reports depression after the initial surgery, but recently has  been having crying spells. He was started on Zoloft and started having episodes his wife was calling "seizures" where his legs would lock up and he would be unable to stand, he would have a blank look in his eyes and would not respond to her for a few seconds. He started having hand tremors. With discontinuation of Zoloft, these episodes had stopped. No prior history of seizures or family history of seizures. He was started on Remeron but is too sleepy on it. He has orthostatic hypotension  with dizziness upon standing. He denies any headaches, vision changes, dysarthria/dysphagia, neck/back pain, bowel/bladder dysfunction, anosmia, tremors (stopped with Zoloft discontinuation). He had TIAs in 1998 where he would be unable to move an arm or leg, and underwent bilateral carotid endarterectomy.   Diagnostic Data: I personally reviewed head CT without contrast done 05/15/14 which did not show any acute changes. There was moderate cerebral atrophy with progression from exam in 2008. There was mild chronic microvascular disease. TSH normal, B12 low at 171, he is taking daily B12 supplements  PAST MEDICAL HISTORY: Past Medical History:  Diagnosis Date  . Acid reflux disease   . Arthritis    "all over"  . Arthritis   . Barrett's esophagus   . Basal cell carcinoma of nose   . CAROTID ARTERY STENOSIS, BILATERAL 08/27/2008  . Chronic bronchitis ([place])    "most q yr; he's had it several times" (12/20/2013)  . COPD (chronic obstructive pulmonary disease) ([place])    "severe" (12/20/2013)  . COPD (chronic obstructive pulmonary disease) ([place])   . Coronary artery stenosis   . Diabetes mellitus without complication ([place])   . Diaphragmatic hernia without mention of obstruction or gangrene   . Diverticulitis   . Diverticulosis of colon   . Dyspnea   . Esophageal stenosis   . Fatty liver disease, nonalcoholic   . GERD (gastroesophageal reflux disease)   . Glaucoma   . Glaucoma   . High cholesterol   . History of blood transfusion 2007   "related to OR"  . History of hiatal hernia    "repaired when esophagus removed"  . History of stomach ulcers   . HTN (hypertension)   . Hyperlipidemia   . Hypertension   . Kidney stones    "passed them all" (12/20/2013)  . Memory loss    DEMENTIA  . Personal history of other malignant neoplasm of skin   . Pneumonia 1990's X 4  . RENAL CALCULUS, HX OF 08/28/2008  . Renal disorder   . Sinus headache    "seasonal"  . Stroke ([place])   . TIA (transient  ischemic attack) 1998 X "a few"  . Type II diabetes mellitus ([facility])     MEDICATIONS: Current Outpatient Prescriptions on File Prior to Visit  Medication Sig Dispense Refill  . amLODipine (NORVASC) 10 MG tablet Take 1 tablet (10 mg total) by mouth daily. 30 tablet 6  . brimonidine (ALPHAGAN) 0.15 % ophthalmic solution Place 1 drop into the left eye 2 (two) times daily. 5 mL 12  . budesonide-formoterol (SYMBICORT) 160-4.5 MCG/ACT inhaler Inhale 2 puffs into the lungs 2 (two) times daily.    . Cholecalciferol (VITAMIN D) 1000 UNITS capsule Take 1,000 Units by mouth daily.      . cloNIDine (CATAPRES) 0.2 MG tablet Take 0.5 tablets (0.1 mg total) by mouth daily as needed (for SBP >140). Only take 1/2 tablet if systolic is over [id number]. 30 tablet 0  . clopidogrel (PLAVIX) 75 MG tablet TAKE 1  TABLET BY MOUTH EVERY DAY 90 tablet 3  . dextromethorphan-guaiFENesin (MUCINEX DM) 30-600 MG 12hr tablet Take 1 tablet by mouth 2 (two) times daily as needed for cough.    . donepezil (ARICEPT) 10 MG tablet Take 10 mg by mouth at bedtime.    . fluticasone (FLONASE) 50 MCG/ACT nasal spray Place 2 sprays into both nostrils 2 (two) times daily as needed for allergies.     Marland Kitchen loratadine (CLARITIN) 10 MG tablet Take 10 mg by mouth daily as needed for allergies.    Marland Kitchen LORazepam (ATIVAN) 0.5 MG tablet Take 1 tablet (0.5 mg total) by mouth at bedtime. 30 tablet 3  . losartan (COZAAR) 100 MG tablet Take 1 tablet (100 mg total) by mouth daily. 90 tablet 1  . metFORMIN (GLUCOPHAGE) 500 MG tablet Take 1 tablet (500 mg total) by mouth 2 (two) times daily with a meal. 180 tablet 3  . mirtazapine (REMERON) 7.5 MG tablet Take 15 mg by mouth at bedtime.     . Multiple Vitamins-Minerals (PRESERVISION AREDS 2) CAPS Take 1 capsule by mouth 2 (two) times daily.    Marland Kitchen omeprazole (PRILOSEC) 20 MG capsule Take 20 mg by mouth 2 (two) times daily before a meal.     . potassium chloride SA (K-DUR,KLOR-CON) 20 MEQ tablet Take 1 tablet (20 mEq  total) by mouth daily. 90 tablet 1  . promethazine (PHENERGAN) 25 MG tablet Take 25 mg by mouth every 8 (eight) hours as needed for nausea or vomiting.    . simvastatin (ZOCOR) 20 MG tablet TAKE 1 TABLET BY MOUTH DAILY AT 6 PM 90 tablet 0  . vitamin B-12 (CYANOCOBALAMIN) 1000 MCG tablet Take 1,000 mcg by mouth daily.    Marland Kitchen acetaminophen (TYLENOL) 500 MG tablet Take 1,000 mg by mouth every 6 (six) hours as needed for fever.    Marland Kitchen albuterol (PROVENTIL HFA;VENTOLIN HFA) 108 (90 Base) MCG/ACT inhaler Inhale 2 puffs into the lungs every 4 (four) hours as needed for wheezing or shortness of breath.    Marland Kitchen albuterol (PROVENTIL) (2.5 MG/3ML) 0.083% nebulizer solution Take 2.5 mg by nebulization every 4 (four) hours as needed for wheezing or shortness of breath.     No current facility-administered medications on file prior to visit.     ALLERGIES: Allergies  Allergen Reactions  . Moxifloxacin Anaphylaxis    Avelox REACTION: anaphylaxis  . Quinolones Anaphylaxis  . Sulfonamide Derivatives Hives  . Timolol Maleate Other (See Comments)    Causes severe chest congestion  . Amoxicillin     Rash, sweating, sob  . Augmentin [Amoxicillin-Pot Clavulanate]     Anaphylaxis   . Sulfa Antibiotics Rash    FAMILY HISTORY: Family History  Problem Relation Age of Onset  . Colon cancer Mother   . Lung cancer Mother   . Heart attack Mother   . Stomach cancer Father   . Esophageal cancer Father   . Colon cancer Maternal Grandfather   . Heart attack Brother   . Heart attack Brother     SOCIAL HISTORY: Social History   Social History  . Marital status: Married    Spouse name: N/A  . Number of children: 2  . Years of education: N/A   Occupational History  . retired Automotive engineer    Social History Main Topics  . Smoking status: Former Smoker    Packs/day: 1.00    Years: 40.00    Types: Cigarettes    Quit date: 01/24/1978  . Smokeless tobacco: Never Used  .  Alcohol use No  . Drug use: No    . Sexual activity: Not Currently   Other Topics Concern  . Not on file   Social History Narrative   ** Merged History Encounter **        REVIEW OF SYSTEMS: Constitutional: No fevers, chills, or sweats, no generalized fatigue, change in appetite Eyes: No visual changes, double vision, eye pain Ear, nose and throat: No hearing loss, ear pain, nasal congestion, sore throat Cardiovascular: No chest pain, palpitations Respiratory:  No shortness of breath at rest or with exertion, wheezes GastrointestinaI: No nausea, vomiting, diarrhea, abdominal pain, fecal incontinence Genitourinary:  No dysuria, urinary retention or frequency Musculoskeletal:  No neck pain, back pain Integumentary: No rash, pruritus, skin lesions Neurological: as above Psychiatric: No depression, insomnia, anxiety Endocrine: No palpitations, fatigue, diaphoresis, mood swings, change in appetite, change in weight, increased thirst Hematologic/Lymphatic:  No anemia, purpura, petechiae. Allergic/Immunologic: no itchy/runny eyes, nasal congestion, recent allergic reactions, rashes  PHYSICAL EXAM: Vitals:   02/26/16 1044  BP: 136/82  Pulse: 82   General: No acute distress, became tearful during the visit. Speaks in whispers and keeps head bent down during the visit Head:  Normocephalic/atraumatic Neck: supple, no paraspinal tenderness, full range of motion Heart:  Regular rate and rhythm Lungs:  Clear to auscultation bilaterally Back: No paraspinal tenderness Skin/Extremities: No rash, no edema Neurological Exam: alert and oriented to person, place. No aphasia or dysarthria. Fund of knowledge is appropriate.  Recent and remote memory are impaired.  Attention and concentration are reduced.    Able to name objects and repeat phrases.  MMSE - Mini Mental State Exam 02/26/2016 03/09/2015  Orientation to time 0 1  Orientation to Place 4 3  Registration 3 3  Attention/ Calculation 0 0  Recall 0 0  Language- name 2  objects 2 2  Language- repeat 1 1  Language- follow 3 step command 3 3  Language- read & follow direction 1 1  Write a sentence 1 0  Copy design 1 0  Total score 16 14   Cranial nerves: Pupils asymmetric with right pupil larger, reactive to light bilaterally.  Extraocular movements intact with no nystagmus. Left-sided neglect. Facial sensation intact. No facial asymmetry. Tongue, uvula, palate midline.  Motor: Bulk and tone normal, no cogwheeling, muscle strength 5/5 but neglects the left side, uses only his right hand to fold paper. No pronator drift.  Sensation to light touch, temperature and vibration intact.  No extinction to double simultaneous stimulation.  Deep tendon reflexes 2+ throughout, toes downgoing.  Finger to nose testing intact.  Gait shuffling, takes several steps to turn. Unable to tandem walk, negative Romberg test.   IMPRESSION: This is an 83 yo RH man with a history of hypertension, diabetes, CAD, with moderate dementia. MMSE today 16/30 (14/30 in February 2017). He had a right hemispheric stroke since his last visit, embolic vs watershed. Embolic workup negative. He is taking Plavix and Zocor, continue maximum medical management. He is on Aricept 10mg  daily without side effects. He had dizziness on Namenda. He is again noted to become tearful in the office today. Nuedexta was cost-prohibitive. We discussed effects of mood on memory, his wife will discuss depression with his PCP. We discussed home safety and looking into getting more help at home. He will follow-up in 1 year or earlier if needed.   Thank you for allowing me to participate in his care.  Please do not hesitate to call for any questions  or concerns.  The duration of this appointment visit was 25 minutes of face-to-face time with the patient.  Greater than 50% of this time was spent in counseling, explanation of diagnosis, planning of further management, and coordination of care.   Ellouise Newer, M.D.   CC: Dr.  Sharlet Salina

## 2016-02-26 NOTE — Telephone Encounter (Signed)
Pls let her know I will send a note, but she also needs to bring it up. Thanks

## 2016-02-27 ENCOUNTER — Encounter (HOSPITAL_COMMUNITY): Payer: Self-pay

## 2016-02-27 ENCOUNTER — Inpatient Hospital Stay (HOSPITAL_COMMUNITY)
Admission: EM | Admit: 2016-02-27 | Discharge: 2016-02-29 | DRG: 190 | Disposition: A | Discharge status: Home / Self Care | Payer: PPO | Attending: Internal Medicine | Admitting: Internal Medicine

## 2016-02-27 ENCOUNTER — Emergency Department (HOSPITAL_COMMUNITY): Payer: PPO

## 2016-02-27 DIAGNOSIS — Z800 Family history of malignant neoplasm of digestive organs: Secondary | ICD-10-CM | POA: Diagnosis not present

## 2016-02-27 DIAGNOSIS — Z9841 Cataract extraction status, right eye: Secondary | ICD-10-CM | POA: Diagnosis not present

## 2016-02-27 DIAGNOSIS — J40 Bronchitis, not specified as acute or chronic: Secondary | ICD-10-CM | POA: Diagnosis present

## 2016-02-27 DIAGNOSIS — I10 Essential (primary) hypertension: Secondary | ICD-10-CM | POA: Diagnosis not present

## 2016-02-27 DIAGNOSIS — Z8249 Family history of ischemic heart disease and other diseases of the circulatory system: Secondary | ICD-10-CM

## 2016-02-27 DIAGNOSIS — Z880 Allergy status to penicillin: Secondary | ICD-10-CM

## 2016-02-27 DIAGNOSIS — I2510 Atherosclerotic heart disease of native coronary artery without angina pectoris: Secondary | ICD-10-CM | POA: Diagnosis present

## 2016-02-27 DIAGNOSIS — Z87442 Personal history of urinary calculi: Secondary | ICD-10-CM

## 2016-02-27 DIAGNOSIS — R531 Weakness: Secondary | ICD-10-CM | POA: Diagnosis not present

## 2016-02-27 DIAGNOSIS — E872 Acidosis, unspecified: Secondary | ICD-10-CM

## 2016-02-27 DIAGNOSIS — R509 Fever, unspecified: Secondary | ICD-10-CM

## 2016-02-27 DIAGNOSIS — J449 Chronic obstructive pulmonary disease, unspecified: Secondary | ICD-10-CM | POA: Diagnosis present

## 2016-02-27 DIAGNOSIS — J440 Chronic obstructive pulmonary disease with acute lower respiratory infection: Secondary | ICD-10-CM | POA: Diagnosis not present

## 2016-02-27 DIAGNOSIS — J189 Pneumonia, unspecified organism: Secondary | ICD-10-CM | POA: Diagnosis not present

## 2016-02-27 DIAGNOSIS — D649 Anemia, unspecified: Secondary | ICD-10-CM | POA: Diagnosis present

## 2016-02-27 DIAGNOSIS — Z888 Allergy status to other drugs, medicaments and biological substances status: Secondary | ICD-10-CM | POA: Diagnosis not present

## 2016-02-27 DIAGNOSIS — Z801 Family history of malignant neoplasm of trachea, bronchus and lung: Secondary | ICD-10-CM | POA: Diagnosis not present

## 2016-02-27 DIAGNOSIS — Z85828 Personal history of other malignant neoplasm of skin: Secondary | ICD-10-CM | POA: Diagnosis not present

## 2016-02-27 DIAGNOSIS — Z9889 Other specified postprocedural states: Secondary | ICD-10-CM | POA: Diagnosis not present

## 2016-02-27 DIAGNOSIS — F0391 Unspecified dementia with behavioral disturbance: Secondary | ICD-10-CM | POA: Diagnosis not present

## 2016-02-27 DIAGNOSIS — Z882 Allergy status to sulfonamides status: Secondary | ICD-10-CM

## 2016-02-27 DIAGNOSIS — Z87891 Personal history of nicotine dependence: Secondary | ICD-10-CM

## 2016-02-27 DIAGNOSIS — K219 Gastro-esophageal reflux disease without esophagitis: Secondary | ICD-10-CM | POA: Diagnosis not present

## 2016-02-27 DIAGNOSIS — E118 Type 2 diabetes mellitus with unspecified complications: Secondary | ICD-10-CM | POA: Diagnosis present

## 2016-02-27 DIAGNOSIS — H409 Unspecified glaucoma: Secondary | ICD-10-CM | POA: Diagnosis present

## 2016-02-27 DIAGNOSIS — Z8701 Personal history of pneumonia (recurrent): Secondary | ICD-10-CM

## 2016-02-27 DIAGNOSIS — R7989 Other specified abnormal findings of blood chemistry: Secondary | ICD-10-CM | POA: Diagnosis present

## 2016-02-27 DIAGNOSIS — Z7984 Long term (current) use of oral hypoglycemic drugs: Secondary | ICD-10-CM

## 2016-02-27 DIAGNOSIS — Z8673 Personal history of transient ischemic attack (TIA), and cerebral infarction without residual deficits: Secondary | ICD-10-CM | POA: Diagnosis not present

## 2016-02-27 DIAGNOSIS — Z8711 Personal history of peptic ulcer disease: Secondary | ICD-10-CM

## 2016-02-27 DIAGNOSIS — M6281 Muscle weakness (generalized): Secondary | ICD-10-CM | POA: Diagnosis not present

## 2016-02-27 DIAGNOSIS — Z9842 Cataract extraction status, left eye: Secondary | ICD-10-CM

## 2016-02-27 DIAGNOSIS — Z79899 Other long term (current) drug therapy: Secondary | ICD-10-CM

## 2016-02-27 DIAGNOSIS — M1990 Unspecified osteoarthritis, unspecified site: Secondary | ICD-10-CM | POA: Diagnosis not present

## 2016-02-27 DIAGNOSIS — Z7902 Long term (current) use of antithrombotics/antiplatelets: Secondary | ICD-10-CM

## 2016-02-27 DIAGNOSIS — Z7951 Long term (current) use of inhaled steroids: Secondary | ICD-10-CM

## 2016-02-27 DIAGNOSIS — Z961 Presence of intraocular lens: Secondary | ICD-10-CM | POA: Diagnosis present

## 2016-02-27 DIAGNOSIS — E785 Hyperlipidemia, unspecified: Secondary | ICD-10-CM | POA: Diagnosis present

## 2016-02-27 DIAGNOSIS — K2270 Barrett's esophagus without dysplasia: Secondary | ICD-10-CM | POA: Diagnosis present

## 2016-02-27 DIAGNOSIS — R0602 Shortness of breath: Secondary | ICD-10-CM | POA: Diagnosis not present

## 2016-02-27 LAB — LIPASE, BLOOD: Lipase: 16 U/L (ref 11–51)

## 2016-02-27 LAB — COMPREHENSIVE METABOLIC PANEL
ALBUMIN: 3.8 g/dL (ref 3.5–5.0)
ALK PHOS: 64 U/L (ref 38–126)
ALT: 15 U/L — AB (ref 17–63)
AST: 31 U/L (ref 15–41)
Anion gap: 10 (ref 5–15)
BILIRUBIN TOTAL: 1.4 mg/dL — AB (ref 0.3–1.2)
BUN: 30 mg/dL — AB (ref 6–20)
CALCIUM: 10.2 mg/dL (ref 8.9–10.3)
CO2: 27 mmol/L (ref 22–32)
Chloride: 99 mmol/L — ABNORMAL LOW (ref 101–111)
Creatinine, Ser: 1.44 mg/dL — ABNORMAL HIGH (ref 0.61–1.24)
GFR calc Af Amer: 50 mL/min — ABNORMAL LOW (ref >60)
GFR calc non Af Amer: 43 mL/min — ABNORMAL LOW (ref >60)
GLUCOSE: 110 mg/dL — AB (ref 65–99)
Potassium: 4.8 mmol/L (ref 3.5–5.1)
SODIUM: 136 mmol/L (ref 135–145)
TOTAL PROTEIN: 6.9 g/dL (ref 6.5–8.1)

## 2016-02-27 LAB — MAGNESIUM: Magnesium: 1.6 mg/dL — ABNORMAL LOW (ref 1.7–2.4)

## 2016-02-27 LAB — CBC WITH DIFFERENTIAL/PLATELET
BASOS PCT: 0 %
Basophils Absolute: 0 10*3/uL (ref 0.0–0.1)
EOS ABS: 0.3 10*3/uL (ref 0.0–0.7)
Eosinophils Relative: 3 %
HEMATOCRIT: 36 % — AB (ref 39.0–52.0)
HEMOGLOBIN: 11.5 g/dL — AB (ref 13.0–17.0)
LYMPHS ABS: 2.3 10*3/uL (ref 0.7–4.0)
Lymphocytes Relative: 22 %
MCH: 27.1 pg (ref 26.0–34.0)
MCHC: 31.9 g/dL (ref 30.0–36.0)
MCV: 84.9 fL (ref 78.0–100.0)
MONO ABS: 1.6 10*3/uL — AB (ref 0.1–1.0)
Monocytes Relative: 15 %
NEUTROS PCT: 60 %
Neutro Abs: 6.4 10*3/uL (ref 1.7–7.7)
Platelets: 177 10*3/uL (ref 150–400)
RBC: 4.24 MIL/uL (ref 4.22–5.81)
RDW: 15.6 % — AB (ref 11.5–15.5)
WBC: 10.6 10*3/uL — ABNORMAL HIGH (ref 4.0–10.5)

## 2016-02-27 LAB — URINALYSIS, ROUTINE W REFLEX MICROSCOPIC
Bilirubin Urine: NEGATIVE
Glucose, UA: NEGATIVE mg/dL
Hgb urine dipstick: NEGATIVE
Ketones, ur: NEGATIVE mg/dL
Leukocytes, UA: NEGATIVE
NITRITE: NEGATIVE
PH: 5 (ref 5.0–8.0)
Protein, ur: NEGATIVE mg/dL
SPECIFIC GRAVITY, URINE: 1.019 (ref 1.005–1.030)

## 2016-02-27 LAB — GLUCOSE, CAPILLARY: Glucose-Capillary: 142 mg/dL — ABNORMAL HIGH (ref 65–99)

## 2016-02-27 LAB — INFLUENZA PANEL BY PCR (TYPE A & B)
INFLBPCR: NEGATIVE
Influenza A By PCR: NEGATIVE

## 2016-02-27 LAB — I-STAT TROPONIN, ED: TROPONIN I, POC: 0.01 ng/mL (ref 0.00–0.08)

## 2016-02-27 LAB — PHOSPHORUS: Phosphorus: 3.8 mg/dL (ref 2.5–4.6)

## 2016-02-27 LAB — I-STAT CG4 LACTIC ACID, ED: Lactic Acid, Venous: 2.24 mmol/L (ref 0.5–1.9)

## 2016-02-27 LAB — LACTIC ACID, PLASMA: Lactic Acid, Venous: 1.5 mmol/L (ref 0.5–1.9)

## 2016-02-27 LAB — RAPID STREP SCREEN (MED CTR MEBANE ONLY): Streptococcus, Group A Screen (Direct): NEGATIVE

## 2016-02-27 MED ORDER — LORAZEPAM 0.5 MG PO TABS: 0.5000 mg | ORAL_TABLET | Freq: Four times a day (QID) | ORAL | Status: DC | PRN

## 2016-02-27 MED ORDER — ALBUTEROL SULFATE (2.5 MG/3ML) 0.083% IN NEBU: 2.5000 mg | INHALATION_SOLUTION | RESPIRATORY_TRACT | Status: DC | PRN

## 2016-02-27 MED ORDER — SODIUM CHLORIDE 0.9 % IV BOLUS (SEPSIS)
500.0000 mL | Freq: Once | INTRAVENOUS | Status: AC
  Administered 2016-02-27: 500 mL via INTRAVENOUS

## 2016-02-27 MED ORDER — DEXTROSE 5 % IV SOLN
1.0000 g | Freq: Every day | INTRAVENOUS | Status: DC
  Administered 2016-02-28 (×2): 1 g via INTRAVENOUS
  Filled 2016-02-27 (×3): qty 10

## 2016-02-27 MED ORDER — MOMETASONE FURO-FORMOTEROL FUM 200-5 MCG/ACT IN AERO
2.0000 | INHALATION_SPRAY | Freq: Two times a day (BID) | RESPIRATORY_TRACT | Status: DC
  Administered 2016-02-28 – 2016-02-29 (×3): 2 via RESPIRATORY_TRACT
  Filled 2016-02-27 (×2): qty 8.8

## 2016-02-27 MED ORDER — AZITHROMYCIN 250 MG PO TABS
500.0000 mg | ORAL_TABLET | Freq: Every day | ORAL | Status: DC
  Administered 2016-02-28 (×2): 500 mg via ORAL
  Filled 2016-02-27 (×2): qty 2

## 2016-02-27 MED ORDER — DM-GUAIFENESIN ER 30-600 MG PO TB12: 1.0000 | ORAL_TABLET | Freq: Two times a day (BID) | ORAL | Status: DC | PRN

## 2016-02-27 MED ORDER — LOSARTAN POTASSIUM 50 MG PO TABS
100.0000 mg | ORAL_TABLET | Freq: Every day | ORAL | Status: DC
  Administered 2016-02-28 – 2016-02-29 (×2): 100 mg via ORAL
  Filled 2016-02-27 (×2): qty 2

## 2016-02-27 MED ORDER — ENOXAPARIN SODIUM 40 MG/0.4ML ~~LOC~~ SOLN
40.0000 mg | SUBCUTANEOUS | Status: DC
  Administered 2016-02-27 – 2016-02-28 (×2): 40 mg via SUBCUTANEOUS
  Filled 2016-02-27 (×2): qty 0.4

## 2016-02-27 MED ORDER — PANTOPRAZOLE SODIUM 40 MG PO TBEC
40.0000 mg | DELAYED_RELEASE_TABLET | Freq: Every day | ORAL | Status: DC
  Administered 2016-02-28 – 2016-02-29 (×2): 40 mg via ORAL
  Filled 2016-02-27 (×2): qty 1

## 2016-02-27 MED ORDER — IPRATROPIUM-ALBUTEROL 0.5-2.5 (3) MG/3ML IN SOLN
3.0000 mL | Freq: Three times a day (TID) | RESPIRATORY_TRACT | Status: DC
  Administered 2016-02-28 – 2016-02-29 (×4): 3 mL via RESPIRATORY_TRACT
  Filled 2016-02-27 (×4): qty 3

## 2016-02-27 MED ORDER — CLOPIDOGREL BISULFATE 75 MG PO TABS
75.0000 mg | ORAL_TABLET | Freq: Every day | ORAL | Status: DC
  Administered 2016-02-28 – 2016-02-29 (×2): 75 mg via ORAL
  Filled 2016-02-27 (×2): qty 1

## 2016-02-27 MED ORDER — SODIUM CHLORIDE 0.9 % IV SOLN
INTRAVENOUS | Status: AC
  Administered 2016-02-27 – 2016-02-28 (×2): via INTRAVENOUS

## 2016-02-27 MED ORDER — LORATADINE 10 MG PO TABS: 10.0000 mg | ORAL_TABLET | Freq: Every day | ORAL | Status: DC | PRN

## 2016-02-27 MED ORDER — LOSARTAN POTASSIUM 50 MG PO TABS: 100.0000 mg | ORAL_TABLET | Freq: Every day | ORAL | Status: DC

## 2016-02-27 MED ORDER — LORAZEPAM 0.5 MG PO TABS: 0.5000 mg | ORAL_TABLET | Freq: Every day | ORAL | Status: DC

## 2016-02-27 MED ORDER — BRIMONIDINE TARTRATE 0.15 % OP SOLN
1.0000 [drp] | Freq: Two times a day (BID) | OPHTHALMIC | Status: DC
  Administered 2016-02-27 – 2016-02-29 (×4): 1 [drp] via OPHTHALMIC
  Filled 2016-02-27: qty 5

## 2016-02-27 MED ORDER — ACETAMINOPHEN 325 MG PO TABS: 650.0000 mg | ORAL_TABLET | Freq: Four times a day (QID) | ORAL | Status: DC | PRN

## 2016-02-27 MED ORDER — SIMVASTATIN 20 MG PO TABS
20.0000 mg | ORAL_TABLET | Freq: Every day | ORAL | Status: DC
  Administered 2016-02-27 – 2016-02-28 (×2): 20 mg via ORAL
  Filled 2016-02-27 (×2): qty 1

## 2016-02-27 MED ORDER — INSULIN ASPART 100 UNIT/ML ~~LOC~~ SOLN
0.0000 [IU] | Freq: Three times a day (TID) | SUBCUTANEOUS | Status: DC
  Administered 2016-02-28 – 2016-02-29 (×4): 1 [IU] via SUBCUTANEOUS

## 2016-02-27 MED ORDER — SODIUM CHLORIDE 0.9 % IV SOLN
Freq: Once | INTRAVENOUS | Status: AC
  Administered 2016-02-27: 100 mL/h via INTRAVENOUS

## 2016-02-27 MED ORDER — POTASSIUM CHLORIDE CRYS ER 20 MEQ PO TBCR
20.0000 meq | EXTENDED_RELEASE_TABLET | Freq: Every day | ORAL | Status: DC
  Administered 2016-02-28 – 2016-02-29 (×2): 20 meq via ORAL
  Filled 2016-02-27 (×2): qty 1

## 2016-02-27 MED ORDER — ONDANSETRON HCL 4 MG/2ML IJ SOLN: 4.0000 mg | Freq: Four times a day (QID) | INTRAMUSCULAR | Status: DC | PRN

## 2016-02-27 MED ORDER — ALBUTEROL SULFATE (2.5 MG/3ML) 0.083% IN NEBU
2.5000 mg | INHALATION_SOLUTION | Freq: Four times a day (QID) | RESPIRATORY_TRACT | Status: DC
  Administered 2016-02-27: 2.5 mg via RESPIRATORY_TRACT
  Filled 2016-02-27: qty 3

## 2016-02-27 MED ORDER — MAGNESIUM SULFATE 2 GM/50ML IV SOLN
2.0000 g | Freq: Once | INTRAVENOUS | Status: AC
  Administered 2016-02-28: 2 g via INTRAVENOUS
  Filled 2016-02-27: qty 50

## 2016-02-27 MED ORDER — DONEPEZIL HCL 5 MG PO TABS
10.0000 mg | ORAL_TABLET | Freq: Every day | ORAL | Status: DC
  Administered 2016-02-27 – 2016-02-28 (×2): 10 mg via ORAL
  Filled 2016-02-27 (×2): qty 2

## 2016-02-27 MED ORDER — AMLODIPINE BESYLATE 10 MG PO TABS
10.0000 mg | ORAL_TABLET | Freq: Every day | ORAL | Status: DC
  Administered 2016-02-28 – 2016-02-29 (×2): 10 mg via ORAL
  Filled 2016-02-27 (×2): qty 1

## 2016-02-27 MED ORDER — CLONIDINE HCL 0.1 MG PO TABS: 0.1000 mg | ORAL_TABLET | Freq: Every day | ORAL | Status: DC | PRN

## 2016-02-27 MED ORDER — MIRTAZAPINE 15 MG PO TABS
15.0000 mg | ORAL_TABLET | Freq: Every day | ORAL | Status: DC
  Administered 2016-02-27 – 2016-02-28 (×2): 15 mg via ORAL
  Filled 2016-02-27 (×2): qty 1

## 2016-02-27 MED ORDER — ONDANSETRON HCL 4 MG PO TABS
4.0000 mg | ORAL_TABLET | Freq: Four times a day (QID) | ORAL | Status: DC | PRN
Start: 2016-02-27 — End: 2016-02-29

## 2016-02-27 MED ORDER — PROMETHAZINE HCL 25 MG PO TABS: 25.0000 mg | ORAL_TABLET | Freq: Three times a day (TID) | ORAL | Status: DC | PRN

## 2016-02-27 MED ORDER — IPRATROPIUM BROMIDE 0.02 % IN SOLN
0.5000 mg | Freq: Four times a day (QID) | RESPIRATORY_TRACT | Status: DC
  Administered 2016-02-27: 0.5 mg via RESPIRATORY_TRACT
  Filled 2016-02-27: qty 2.5

## 2016-02-27 NOTE — ED Notes (Signed)
Bed: [id number] Expected date: 02/27/16 Expected time: 3:22 PM Means of arrival:  Comments: Fever

## 2016-02-27 NOTE — H&P (Addendum)
History and Physical    CHIRON UMSTED [id number] DOB: [birth year] DOA: 02/27/2016  PCP: Hoyt Koch, MD   Patient coming from: Home.  Chief Complaint: Fever  HPI: Chad [name] is a 83 y.o. male with medical history significant of acid reflux disease, Barrett's esophagus, esophageal stenosis, osteoarthritis, basal cell CA of the nose, bilateral carotid artery stenosis, COPD, CAD, type 2 diabetes, diverticulosis, nonalcoholic fatty liver disease, glaucoma, hyperlipidemia, hypertension, urolithiasis, TIA, CVA, dementia, multiple pneumonia episodes (most recent 3 months ago) who is being brought to the emergency department for evaluation of generalized weakness, sore throat, mild cough of productive sputum, however the patient is unable to expectorate and fever since earlier today in the morning. He is unable to provide history due to dementia and details are provided by his wife. His wife also mentions, that he had elevated blood pressure yesterday and had to use when necessary clonidine to treat.  ED Course: Patient received oxygen, albuterol and 500 mL and is bolus in the emergency department. His workup showed EKG sinus rhythm with right axis deviation and APCs. WBC of 10.6, hemoglobin 11.5 and platelets 177. His sodium 136, potassium 4.8, chloride 99, bicarbonate 27, for his lactic acid 2.24 and second lactic acid level 1.5 mmol/L. Troponin and lipase levels were normal. BUN was 30, creatinine 1.44, glucose 110, total bilirubin 1.4, magnesium 1.6 and phosphorus 3.4 mg/dL. His chest radiograph was negative.  Review of Systems: As per HPI otherwise 10 point review of systems negative.    Past Medical History:  Diagnosis Date  . Acid reflux disease   . Arthritis    "all over"  . Arthritis   . Barrett's esophagus   . Basal cell carcinoma of nose   . CAROTID ARTERY STENOSIS, BILATERAL 08/27/2008  . Chronic bronchitis ([place])    "most q yr; he's had it several times" (12/20/2013)   . COPD (chronic obstructive pulmonary disease) ([place])    "severe" (12/20/2013)  . COPD (chronic obstructive pulmonary disease) ([place])   . Coronary artery stenosis   . Diabetes mellitus without complication ([place])   . Diaphragmatic hernia without mention of obstruction or gangrene   . Diverticulitis   . Diverticulosis of colon   . Dyspnea   . Esophageal stenosis   . Fatty liver disease, nonalcoholic   . GERD (gastroesophageal reflux disease)   . Glaucoma   . Glaucoma   . High cholesterol   . History of blood transfusion 2007   "related to OR"  . History of hiatal hernia    "repaired when esophagus removed"  . History of stomach ulcers   . HTN (hypertension)   . Hyperlipidemia   . Hypertension   . Kidney stones    "passed them all" (12/20/2013)  . Memory loss    DEMENTIA  . Personal history of other malignant neoplasm of skin   . Pneumonia 1990's X 4  . RENAL CALCULUS, HX OF 08/28/2008  . Renal disorder   . Sinus headache    "seasonal"  . Stroke ([place])   . TIA (transient ischemic attack) 1998 X "a few"  . Type II diabetes mellitus ([place])     Past Surgical History:  Procedure Laterality Date  . BASAL CELL CARCINOMA EXCISION    . CAROTID ARTERY ANGIOPLASTY    . CAROTID ENDARTERECTOMY Bilateral 02/1996-03/1996  . CATARACT EXTRACTION W/ INTRAOCULAR LENS IMPLANT Bilateral 04/2007 - 09/2007   "left-right"  . ESOPHAGECTOMY  2007   with stomach pull through  . ESOPHAGOGASTRODUODENOSCOPY (  EGD) WITH ESOPHAGEAL DILATION  10/2011  . EYE SURGERY Right 04/2006; 08/2007   "had gas bubbles put in"  . EYE SURGERY Right 01/2007   "air bubble"  . EYE SURGERY Left 12/12/2013   "cleaned his implant w/laser"  . EYE SURGERY    . GLAUCOMA SURGERY Right 11/2008   "implanted drain tube"  . HERNIA REPAIR    . KNEE ARTHROSCOPY Bilateral 04/1989; 08/1996; 06/1999   "right; left; left"  . KNEE SURGERY    . MOHS SURGERY  ?2002   "tip of nose; basal cell"  . NASAL POLYP EXCISION  2005   "benign"  .  PARS PLANA VITRECTOMY Bilateral 01/2007-05/2007  . SHOULDER ARTHROSCOPY Right 04/1998  . SHOULDER SURGERY    . VENTRAL HERNIA REPAIR  02/2006     reports that he quit smoking about 38 years ago. His smoking use included Cigarettes. He has a 40.00 pack-year smoking history. He has never used smokeless tobacco. He reports that he does not drink alcohol or use drugs.  Allergies  Allergen Reactions  . Moxifloxacin Anaphylaxis    Avelox REACTION: anaphylaxis  . Quinolones Anaphylaxis  . Sulfonamide Derivatives Hives  . Timolol Maleate Other (See Comments)    Causes severe chest congestion  . Amoxicillin Other (See Comments)    Rash, sweating, sob Has patient had a PCN reaction causing immediate rash, facial/tongue/throat swelling, SOB or lightheadedness with hypotension: Yes Has patient had a PCN reaction causing severe rash involving mucus membranes or skin necrosis: Yes Has patient had a PCN reaction that required hospitalization Yes Has patient had a PCN reaction occurring within the last 10 years: Yes If all of the above answers are "NO", then may proceed with Cephalosporin use.   . Augmentin [Amoxicillin-Pot Clavulanate] Other (See Comments)    Anaphylaxis  Has patient had a PCN reaction causing immediate rash, facial/tongue/throat swelling, SOB or lightheadedness with hypotension: Yes Has patient had a PCN reaction causing severe rash involving mucus membranes or skin necrosis: Yes Has patient had a PCN reaction that required hospitalization Yes Has patient had a PCN reaction occurring within the last 10 years: Yes If all of the above answers are "NO", then may proceed with Cephalosporin use.   . Sulfa Antibiotics Rash    Family History  Problem Relation Age of Onset  . Colon cancer Mother   . Lung cancer Mother   . Heart attack Mother   . Stomach cancer Father   . Esophageal cancer Father   . Colon cancer Maternal Grandfather   . Heart attack Brother   . Heart attack Brother      Prior to Admission medications   Medication Sig Start Date End Date Taking? Authorizing Provider  acetaminophen (TYLENOL) 500 MG tablet Take 1,000 mg by mouth every 6 (six) hours as needed for fever.   Yes Historical Provider, MD  albuterol (PROVENTIL HFA;VENTOLIN HFA) 108 (90 Base) MCG/ACT inhaler Inhale 2 puffs into the lungs every 4 (four) hours as needed for wheezing or shortness of breath.   Yes Historical Provider, MD  albuterol (PROVENTIL) (2.5 MG/3ML) 0.083% nebulizer solution Take 2.5 mg by nebulization every 4 (four) hours as needed for wheezing or shortness of breath.   Yes Historical Provider, MD  amLODipine (NORVASC) 10 MG tablet Take 1 tablet (10 mg total) by mouth daily. 08/27/15  Yes Hoyt Koch, MD  brimonidine (ALPHAGAN) 0.15 % ophthalmic solution Place 1 drop into the left eye 2 (two) times daily. 06/09/15  Yes Yehuda Savannah I  Marthenia Rolling, MD  budesonide-formoterol (SYMBICORT) 160-4.5 MCG/ACT inhaler Inhale 2 puffs into the lungs 2 (two) times daily.   Yes Historical Provider, MD  Cholecalciferol (VITAMIN D) 1000 UNITS capsule Take 1,000 Units by mouth daily.     Yes Historical Provider, MD  cloNIDine (CATAPRES) 0.2 MG tablet Take 0.5 tablets (0.1 mg total) by mouth daily as needed (for SBP >140). Only take 1/2 tablet if systolic is over [id number]. 0000000  Yes Hoyt Koch, MD  clopidogrel (PLAVIX) 75 MG tablet TAKE 1 TABLET BY MOUTH EVERY DAY 08/20/15  Yes Rosalin Hawking, MD  dextromethorphan-guaiFENesin [facility] DM) 30-600 MG 12hr tablet Take 1 tablet by mouth 2 (two) times daily as needed for cough.   Yes Historical Provider, MD  donepezil (ARICEPT) 10 MG tablet Take 1 tablet (10 mg total) by mouth at bedtime. 02/26/16  Yes Cameron Sprang, MD  fluticasone [facility]) 50 MCG/ACT nasal spray Place 2 sprays into both nostrils 2 (two) times daily as needed for allergies.    Yes Historical Provider, MD  furosemide (LASIX) 40 MG tablet Take 40 mg by mouth daily.   Yes Historical Provider,  MD  loratadine (CLARITIN) 10 MG tablet Take 10 mg by mouth daily as needed for allergies.   Yes Historical Provider, MD  LORazepam (ATIVAN) 0.5 MG tablet Take 1 tablet (0.5 mg total) by mouth at bedtime. 02/01/16  Yes Hoyt Koch, MD  losartan (COZAAR) 100 MG tablet Take 1 tablet (100 mg total) by mouth daily. 01/28/16  Yes Hoyt Koch, MD  metFORMIN (GLUCOPHAGE) 500 MG tablet Take 1 tablet (500 mg total) by mouth 2 (two) times daily with a meal. 02/01/16  Yes Hoyt Koch, MD  mirtazapine (REMERON) 7.5 MG tablet Take 15 mg by mouth at bedtime.    Yes Historical Provider, MD  Multiple Vitamins-Minerals (PRESERVISION AREDS 2) CAPS Take 1 capsule by mouth 2 (two) times daily.   Yes Historical Provider, MD  omeprazole (PRILOSEC) 20 MG capsule Take 20 mg by mouth 2 (two) times daily before a meal.    Yes Historical Provider, MD  potassium chloride SA (K-DUR,KLOR-CON) 20 MEQ tablet Take 1 tablet (20 mEq total) by mouth daily. 01/27/16  Yes Hoyt Koch, MD  promethazine (PHENERGAN) 25 MG tablet Take 25 mg by mouth every 8 (eight) hours as needed for nausea or vomiting.   Yes Historical Provider, MD  simvastatin (ZOCOR) 20 MG tablet TAKE 1 TABLET BY MOUTH DAILY AT 6 PM 01/26/16  Yes Hoyt Koch, MD  vitamin B-12 (CYANOCOBALAMIN) 1000 MCG tablet Take 1,000 mcg by mouth daily.   Yes Historical Provider, MD    Physical Exam:  Constitutional: NAD, calm, comfortable Vitals:   02/27/16 1538 02/27/16 1600 02/27/16 1700 02/27/16 1730  BP:  108/64 119/56 (!) 111/53  Pulse:  66 63 63  Resp:  23 21 20   Temp:      TempSrc:      SpO2:  94% 92% 92%  Weight: 74.8 kg (165 lb)     Height: 5\' 9"  (1.753 m)      Eyes: PERRL, lids and conjunctivae normal ENMT: Mucous membranes are moist. Posterior pharynx shows mild erythema. Neck: normal, supple, no masses, no thyromegaly Respiratory: Right lower lobe decreased breath sounds and rales, no wheezing, no crackles. Normal  respiratory effort. No accessory muscle use.  Cardiovascular: Regular rate and rhythm, no murmurs / rubs / gallops. No extremity edema. 2+ pedal pulses. No carotid bruits.  Abdomen: Soft, no tenderness, no  masses palpated. No hepatosplenomegaly. Bowel sounds positive.  Musculoskeletal: no clubbing / cyanosis. Good passive ROM, no contractures. Normal muscle tone.  Skin: no rashes, lesions, ulcers on limited skin exam. Neurologic: CN 2-12 grossly intact. Sensation intact, DTR normal. Moves all extremities. Psychiatric: Alert and oriented x 1, not completely oriented to place, disoriented to time, date and situation.    Labs on Admission: I have personally reviewed following labs and imaging studies  CBC:  Recent Labs Lab 02/27/16 1643  WBC 10.6*  NEUTROABS 6.4  HGB 11.5*  HCT 36.0*  MCV 84.9  PLT 123XX123   Basic Metabolic Panel:  Recent Labs Lab 02/27/16 1643  NA 136  K 4.8  CL 99*  CO2 27  GLUCOSE 110*  BUN 30*  CREATININE 1.44*  CALCIUM 10.2   GFR: Estimated Creatinine Clearance: 38.2 mL/min (by C-G formula based on SCr of 1.44 mg/dL (H)). Liver Function Tests:  Recent Labs Lab 02/27/16 1643  AST 31  ALT 15*  ALKPHOS 64  BILITOT 1.4*  PROT 6.9  ALBUMIN 3.8    Recent Labs Lab 02/27/16 1643  LIPASE 16   No results for input(s): AMMONIA in the last 168 hours. Coagulation Profile: No results for input(s): INR, PROTIME in the last 168 hours. Cardiac Enzymes: No results for input(s): CKTOTAL, CKMB, CKMBINDEX, TROPONINI in the last 168 hours. BNP (last 3 results) No results for input(s): PROBNP in the last 8760 hours. HbA1C: No results for input(s): HGBA1C in the last 72 hours. CBG: No results for input(s): GLUCAP in the last 168 hours. Lipid Profile: No results for input(s): CHOL, HDL, LDLCALC, TRIG, CHOLHDL, LDLDIRECT in the last 72 hours. Thyroid Function Tests: No results for input(s): TSH, T4TOTAL, FREET4, T3FREE, THYROIDAB in the last 72  hours. Anemia Panel: No results for input(s): VITAMINB12, FOLATE, FERRITIN, TIBC, IRON, RETICCTPCT in the last 72 hours. Urine analysis:    Component Value Date/Time   COLORURINE YELLOW 02/27/2016 [place] 02/27/2016 1643   LABSPEC 1.019 02/27/2016 1643   PHURINE 5.0 02/27/2016 1643   GLUCOSEU NEGATIVE 02/27/2016 1643   HGBUR NEGATIVE 02/27/2016 1643   BILIRUBINUR NEGATIVE 02/27/2016 1643   BILIRUBINUR neg 08/27/2015 1626   KETONESUR NEGATIVE 02/27/2016 1643   PROTEINUR NEGATIVE 02/27/2016 1643   UROBILINOGEN negative 08/27/2015 1626   UROBILINOGEN 1.0 07/25/2014 1440   NITRITE NEGATIVE 02/27/2016 1643   LEUKOCYTESUR NEGATIVE 02/27/2016 1643    Radiological Exams on Admission: Dg Chest 2 View  Result Date: 02/27/2016 CLINICAL DATA:  Pt c/o weakness, fever, sob x 2 days. Hx copd, htn. EXAM: CHEST  2 VIEW COMPARISON:  12/16/2015 FINDINGS: Heart size is normal. There is bibasilar atelectasis. No focal consolidations or pleural effusions are identified. Large hiatal hernia is present. No evidence for acute cardiopulmonary abnormality. IMPRESSION: No active cardiopulmonary disease. Electronically Signed   By: Nolon Nations M.D.   On: 02/27/2016 16:56    EKG: Independently reviewed. Vent. rate 66 BPM PR interval * ms QRS duration 85 ms QT/QTc 376/394 ms P-R-T axes 87 92 72 Sinus rhythm Atrial premature complexes in couplets Right axis deviation  Assessment/Plan Principal Problem:   Fever Admit to telemetry unit/observation. Suspected bronchitis versus early pneumonia versus viral disease. Continue supplemental oxygen. Continue scheduled and as needed bronchodilators. Check rapid strep and respiratory virus panel. Follow-up blood cultures and sensitivity. Repeat chest radiograph in a.m. Start Rocephin and oral Zithromax.  Active Problems:   Weakness Likely secondary to fever and decreased oral intake today. Continue current treatment.  Consider PT  evaluation if no improvement.    Moderate dementia with behavioral disturbance Continue Aricept 10 mg by mouth daily at bedtime. Continue Remeron 50 mg by mouth at bedtime. Lorazepam 0.5 mg by mouth every 6 hours as needed while in the hospital Supportive care. Please call wife or discuss in person treatment updates and plan.    Glaucoma Continue Alphagan drops.    Essential hypertension Continue amlodipine 10 mg by mouth daily. Continue clonidine 0.1 mg as needed. Losartan and furosemide are on hold due to elevated creatinine. Monitor blood pressure, electrolytes, BUN and creatinine.    COPD GOLD II  Continue supplemental oxygen. Continue Symbicort or formulary equivalent. Continue nebulized albuterol/ipratropium every 6 hours. Albuterol nebulizers as needed.    Barrett's esophagus Pantoprazole 40 mg po daily.    Controlled diabetes mellitus type 2 with complications (HCC) Carbohydrate modified diet. Hold metformin due to elevated lactic acid for now. CBG monitoring with regular insulin sliding scale.    Hyperlipidemia Continue simvastatin 20 mg by mouth daily. Monitor LFTs periodically.    History of coronary stenosis, carotid artery stenosis and CVA. Continue clopidogrel 75 mg by mouth daily. Continue simvastatin. Not on beta blocker probably due to history of severe COPD.      Anemia Monitor hematocrit and hemoglobin.    Hypomagnesemia Replaced.    DVT prophylaxis: Lovenox SQ. Code Status: Full code. Family Communication:  Waiters,Patsy Spouse   [phone]  Disposition Plan: Admit for further evaluation and treatment. Consults called:  Admission status: Observation/telemetry.   Reubin Milan MD [facility] Pager [phone].  If 7PM-7AM, please contact night-coverage [email or web address] Password [facility]  02/27/2016, 6:53 PM

## 2016-02-27 NOTE — ED Notes (Signed)
Patient denies pain and is resting comfortably.  

## 2016-02-27 NOTE — ED Notes (Signed)
Dr. Laverta Baltimore made aware of abnormal lactic acid values

## 2016-02-27 NOTE — ED Provider Notes (Signed)
Emergency Department Provider Note   I have reviewed the triage vital signs and the nursing notes.   HISTORY  Chief Complaint Weakness and Fever   HPI Chad [name] is a 83 y.o. male with PMH of GERD, COPD, HLD, DM, HLD, dementia, and TIA presents to the emergency department for evaluation of generalized weakness and fever that started today. Patient had elevated blood pressures yesterday afternoon and evening that responded to his home clonidine. Wife noticed this morning that he was very weak and could not get out of bed. When she tried to help him he slid down on the floor and the fire department had to be called to help him back into bed. He ate breakfast and took his morning medication. No concern for head trauma or neck pain. The patient was complaining of mild headache.   Past Medical History:  Diagnosis Date  . Acid reflux disease   . Arthritis    "all over"  . Arthritis   . Barrett's esophagus   . Basal cell carcinoma of nose   . CAROTID ARTERY STENOSIS, BILATERAL 08/27/2008  . Chronic bronchitis ([place])    "most q yr; he's had it several times" (12/20/2013)  . COPD (chronic obstructive pulmonary disease) ([place])    "severe" (12/20/2013)  . COPD (chronic obstructive pulmonary disease) ([place])   . Coronary artery stenosis   . Diabetes mellitus without complication ([place])   . Diaphragmatic hernia without mention of obstruction or gangrene   . Diverticulitis   . Diverticulosis of colon   . Dyspnea   . Esophageal stenosis   . Fatty liver disease, nonalcoholic   . GERD (gastroesophageal reflux disease)   . Glaucoma   . Glaucoma   . High cholesterol   . History of blood transfusion 2007   "related to OR"  . History of hiatal hernia    "repaired when esophagus removed"  . History of stomach ulcers   . HTN (hypertension)   . Hyperlipidemia   . Hypertension   . Kidney stones    "passed them all" (12/20/2013)  . Memory loss    DEMENTIA  . Personal history of other  malignant neoplasm of skin   . Pneumonia 1990's X 4  . RENAL CALCULUS, HX OF 08/28/2008  . Renal disorder   . Sinus headache    "seasonal"  . Stroke ([place])   . TIA (transient ischemic attack) 1998 X "a few"  . Type II diabetes mellitus [facility])     Patient Active Problem List   Diagnosis Date Noted  . Fever 02/27/2016  . Anemia 02/27/2016  . Hypomagnesemia 02/27/2016  . CAP (community acquired pneumonia) 11/27/2015  . Diabetes mellitus without complication ([place])   . Stroke ([place])   . Hypertension   . Hyperlipidemia   . Weakness   . Pseudobulbar affect 07/02/2015  . Controlled diabetes mellitus type 2 with complications ([place]) [id number]  . GERD without esophagitis 06/12/2015  . Cerebral thrombosis with cerebral infarction 06/05/2015  . HCAP (healthcare-associated pneumonia) 06/02/2015  . Urinary incontinence 04/13/2015  . Moderate dementia with behavioral disturbance 03/09/2015  . Depression 03/09/2015  . Legal blindness [place] 08/07/2014  . Routine general medical examination at a health care facility 04/24/2014  . Carotid stenosis 02/24/2014  . Barrett's esophagus 10/11/2011  . FATTY LIVER DISEASE 08/27/2008  . HLD (hyperlipidemia) 05/20/2008  . Glaucoma 05/20/2008  . Essential hypertension 05/20/2008  . COPD GOLD II  05/20/2008  . ARTHRITIS 05/20/2008    Past Surgical History:  Procedure Laterality Date  . BASAL CELL CARCINOMA EXCISION    . CAROTID ARTERY ANGIOPLASTY    . CAROTID ENDARTERECTOMY Bilateral 02/1996-03/1996  . CATARACT EXTRACTION W/ INTRAOCULAR LENS IMPLANT Bilateral 04/2007 - 09/2007   "left-right"  . ESOPHAGECTOMY  2007   with stomach pull through  . ESOPHAGOGASTRODUODENOSCOPY (EGD) WITH ESOPHAGEAL DILATION  10/2011  . EYE SURGERY Right 04/2006; 08/2007   "had gas bubbles put in"  . EYE SURGERY Right 01/2007   "air bubble"  . EYE SURGERY Left 12/12/2013   "cleaned his implant w/laser"  . EYE SURGERY    . GLAUCOMA SURGERY Right 11/2008   "implanted drain  tube"  . HERNIA REPAIR    . KNEE ARTHROSCOPY Bilateral 04/1989; 08/1996; 06/1999   "right; left; left"  . KNEE SURGERY    . MOHS SURGERY  ?2002   "tip of nose; basal cell"  . NASAL POLYP EXCISION  2005   "benign"  . PARS PLANA VITRECTOMY Bilateral 01/2007-05/2007  . SHOULDER ARTHROSCOPY Right 04/1998  . SHOULDER SURGERY    . VENTRAL HERNIA REPAIR  02/2006      Allergies Moxifloxacin; Quinolones; Sulfonamide derivatives; Timolol maleate; Amoxicillin; Augmentin [amoxicillin-pot clavulanate]; and Sulfa antibiotics  Family History  Problem Relation Age of Onset  . Colon cancer Mother   . Lung cancer Mother   . Heart attack Mother   . Stomach cancer Father   . Esophageal cancer Father   . Colon cancer Maternal Grandfather   . Heart attack Brother   . Heart attack Brother     Social History Social History  Substance Use Topics  . Smoking status: Former Smoker    Packs/day: 1.00    Years: 40.00    Types: Cigarettes    Quit date: 01/24/1978  . Smokeless tobacco: Never Used  . Alcohol use No    Review of Systems  Constitutional: Positive fever at home and generalized weakness.  Eyes: No visual changes. ENT: Mild sore throat. Cardiovascular: Denies chest pain. Respiratory: Denies shortness of breath. Gastrointestinal: No abdominal pain. No nausea, no vomiting. No diarrhea.  No constipation. Genitourinary: Negative for dysuria. Musculoskeletal: Negative for back pain. Skin: Negative for rash. Neurological: Negative for headaches, focal weakness or numbness.  10-point ROS otherwise negative.  ____________________________________________   PHYSICAL EXAM:  VITAL SIGNS: ED Triage Vitals  Enc Vitals Group     BP 02/27/16 1536 129/72     Pulse Rate 02/27/16 1536 76     Resp 02/27/16 1536 18     Temp 02/27/16 1536 98.4 F (36.9 C)     Temp Source 02/27/16 1536 Oral     SpO2 02/27/16 1535 94 %     Weight 02/27/16 1538 165 lb (74.8 kg)     Height 02/27/16 1538 5\' 9"   (1.753 m)   Constitutional: Alert and oriented. Well appearing and in no acute distress. Eyes: Conjunctivae are normal. PERRL. Head: Atraumatic. Nose: No congestion/rhinnorhea. Mouth/Throat: Mucous membranes are dry. Oropharynx non-erythematous. Neck: No stridor.  Cardiovascular: Normal rate, regular rhythm. Good peripheral circulation. Grossly normal heart sounds.   Respiratory: Normal respiratory effort.  No retractions. Lungs CTAB. Gastrointestinal: Soft and nontender. No distention.  Musculoskeletal: No lower extremity tenderness nor edema. No gross deformities of extremities. Neurologic:  Normal speech and language. No gross focal neurologic deficits are appreciated.  Skin:  Skin is warm, dry and intact. No rash noted.   ____________________________________________   LABS (all labs ordered are listed, but only abnormal results are displayed)  Labs Reviewed  COMPREHENSIVE METABOLIC PANEL - Abnormal; Notable for the following:       Result Value   Chloride 99 (*)    Glucose, Bld 110 (*)    BUN 30 (*)    Creatinine, Ser 1.44 (*)    ALT 15 (*)    Total Bilirubin 1.4 (*)    GFR calc non Af Amer 43 (*)    GFR calc Af Amer 50 (*)    All other components within normal limits  CBC WITH DIFFERENTIAL/PLATELET - Abnormal; Notable for the following:    WBC 10.6 (*)    Hemoglobin 11.5 (*)    HCT 36.0 (*)    RDW 15.6 (*)    Monocytes Absolute 1.6 (*)    All other components within normal limits  MAGNESIUM - Abnormal; Notable for the following:    Magnesium 1.6 (*)    All other components within normal limits  GLUCOSE, CAPILLARY - Abnormal; Notable for the following:    Glucose-Capillary 142 (*)    All other components within normal limits  GLUCOSE, CAPILLARY - Abnormal; Notable for the following:    Glucose-Capillary 117 (*)    All other components within normal limits  I-STAT CG4 LACTIC ACID, ED - Abnormal; Notable for the following:    Lactic Acid, Venous 2.24 (*)    All  other components within normal limits  RAPID STREP SCREEN (NOT AT [facility])  CULTURE, BLOOD (ROUTINE X 2)  CULTURE, BLOOD (ROUTINE X 2)  RESPIRATORY PANEL BY PCR  CULTURE, GROUP A STREP ([place])  CULTURE, EXPECTORATED SPUTUM-ASSESSMENT  LIPASE, BLOOD  URINALYSIS, ROUTINE W REFLEX MICROSCOPIC  INFLUENZA PANEL BY PCR (TYPE A & B)  LACTIC ACID, PLASMA  PHOSPHORUS  I-STAT TROPOININ, ED   ____________________________________________  EKG   EKG Interpretation  Date/Time:  Saturday February 27 2016 16:46:44 EST Ventricular Rate:  66 PR Interval:    QRS Duration: 85 QT Interval:  376 QTC Calculation: 394 R Axis:   92 Text Interpretation:  Sinus rhythm Atrial premature complexes in couplets Right axis deviation No STEMI.  Confirmed by Blas Riches MD, Sashia Campas [phone]) on 02/27/2016 6:01:09 PM       ____________________________________________  RADIOLOGY  Dg Chest 2 View  Result Date: 02/27/2016 CLINICAL DATA:  Pt c/o weakness, fever, sob x 2 days. Hx copd, htn. EXAM: CHEST  2 VIEW COMPARISON:  12/16/2015 FINDINGS: Heart size is normal. There is bibasilar atelectasis. No focal consolidations or pleural effusions are identified. Large hiatal hernia is present. No evidence for acute cardiopulmonary abnormality. IMPRESSION: No active cardiopulmonary disease. Electronically Signed   By: Nolon Nations M.D.   On: 02/27/2016 16:56    ____________________________________________   PROCEDURES  Procedure(s) performed:   Procedures  None ____________________________________________   INITIAL IMPRESSION / ASSESSMENT AND PLAN / ED COURSE  Pertinent labs & imaging results that were available during my care of the patient were reviewed by me and considered in my medical decision making (see chart for details).  Patient presents to the ED with generalized weakness, fever, and sore throat. No obvious sign of infection on exam or labs. Patient does have a lactic acidosis and history of sepsis. Patient  not meeting SIRS criteria and with no clear infection source I have not started abx. Plan for admission for IVF and correction or lactic acidosis. Discussed my impression and plan with the family.   Discussed patient's case with hospitalist, Dr. Olevia Bowens.  Recommend admission to tele, obs bed.  I will place holding orders per their request.  Patient and family (if present) updated with plan. Care transferred to hospitlaist service.  I reviewed all nursing notes, vitals, pertinent old records, EKGs, labs, imaging (as available).    ____________________________________________  FINAL CLINICAL IMPRESSION(S) / ED DIAGNOSES  Final diagnoses:  Lactic acidosis  Generalized weakness     MEDICATIONS GIVEN DURING THIS VISIT:  Medications  donepezil (ARICEPT) tablet 10 mg (10 mg Oral Given 02/27/16 2251)  dextromethorphan-guaiFENesin (MUCINEX DM) 30-600 MG per 12 hr tablet 1 tablet (not administered)  loratadine (CLARITIN) tablet 10 mg (not administered)  promethazine (PHENERGAN) tablet 25 mg (not administered)  cloNIDine (CATAPRES) tablet 0.1 mg (not administered)  potassium chloride SA (K-DUR,KLOR-CON) CR tablet 20 mEq (not administered)  simvastatin (ZOCOR) tablet 20 mg (20 mg Oral Given 02/27/16 2251)  acetaminophen (TYLENOL) tablet 650 mg (not administered)  amLODipine (NORVASC) tablet 10 mg (not administered)  clopidogrel (PLAVIX) tablet 75 mg (not administered)  mirtazapine (REMERON) tablet 15 mg (15 mg Oral Given 02/27/16 2251)  albuterol (PROVENTIL) (2.5 MG/3ML) 0.083% nebulizer solution 2.5 mg (not administered)  mometasone-formoterol (DULERA) 200-5 MCG/ACT inhaler 2 puff (2 puffs Inhalation Not Given 02/27/16 2222)  brimonidine (ALPHAGAN) 0.15 % ophthalmic solution 1 drop (1 drop Left Eye Given 02/27/16 2251)  pantoprazole (PROTONIX) EC tablet 40 mg (not administered)  enoxaparin (LOVENOX) injection 40 mg (40 mg Subcutaneous Given 02/27/16 2251)  0.9 %  sodium chloride infusion ( Intravenous New  Bag/Given 02/28/16 0156)  ondansetron (ZOFRAN) tablet 4 mg (not administered)    Or  ondansetron (ZOFRAN) injection 4 mg (not administered)  insulin aspart (novoLOG) injection 0-9 Units (not administered)  LORazepam (ATIVAN) tablet 0.5 mg (not administered)  losartan (COZAAR) tablet 100 mg (not administered)  azithromycin (ZITHROMAX) tablet 500 mg (500 mg Oral Given 02/28/16 0039)  cefTRIAXone (ROCEPHIN) 1 g in dextrose 5 % 50 mL IVPB (1 g Intravenous Given 02/28/16 0039)  ipratropium-albuterol (DUONEB) 0.5-2.5 (3) MG/3ML nebulizer solution 3 mL (not administered)  sodium chloride 0.9 % bolus 500 mL (0 mLs Intravenous Stopped 02/27/16 1815)  0.9 %  sodium chloride infusion ( Intravenous Transfusing/Transfer 02/27/16 1957)  magnesium sulfate IVPB 2 g 50 mL (2 g Intravenous Given 02/28/16 0040)     NEW OUTPATIENT MEDICATIONS STARTED DURING THIS VISIT:  None   Note:  This document was prepared using Dragon voice recognition software and may include unintentional dictation errors.  Nanda Quinton, MD Emergency Medicine   Margette Fast, MD 02/28/16 [phone]

## 2016-02-28 ENCOUNTER — Observation Stay (HOSPITAL_COMMUNITY): Payer: PPO

## 2016-02-28 DIAGNOSIS — Z8711 Personal history of peptic ulcer disease: Secondary | ICD-10-CM | POA: Diagnosis not present

## 2016-02-28 DIAGNOSIS — E118 Type 2 diabetes mellitus with unspecified complications: Secondary | ICD-10-CM | POA: Diagnosis not present

## 2016-02-28 DIAGNOSIS — I10 Essential (primary) hypertension: Secondary | ICD-10-CM

## 2016-02-28 DIAGNOSIS — M1990 Unspecified osteoarthritis, unspecified site: Secondary | ICD-10-CM | POA: Diagnosis not present

## 2016-02-28 DIAGNOSIS — Z880 Allergy status to penicillin: Secondary | ICD-10-CM | POA: Diagnosis not present

## 2016-02-28 DIAGNOSIS — H409 Unspecified glaucoma: Secondary | ICD-10-CM | POA: Diagnosis not present

## 2016-02-28 DIAGNOSIS — Z800 Family history of malignant neoplasm of digestive organs: Secondary | ICD-10-CM | POA: Diagnosis not present

## 2016-02-28 DIAGNOSIS — Z9841 Cataract extraction status, right eye: Secondary | ICD-10-CM | POA: Diagnosis not present

## 2016-02-28 DIAGNOSIS — J189 Pneumonia, unspecified organism: Secondary | ICD-10-CM | POA: Diagnosis not present

## 2016-02-28 DIAGNOSIS — J40 Bronchitis, not specified as acute or chronic: Secondary | ICD-10-CM | POA: Diagnosis not present

## 2016-02-28 DIAGNOSIS — Z85828 Personal history of other malignant neoplasm of skin: Secondary | ICD-10-CM | POA: Diagnosis not present

## 2016-02-28 DIAGNOSIS — R531 Weakness: Secondary | ICD-10-CM

## 2016-02-28 DIAGNOSIS — Z882 Allergy status to sulfonamides status: Secondary | ICD-10-CM | POA: Diagnosis not present

## 2016-02-28 DIAGNOSIS — Z961 Presence of intraocular lens: Secondary | ICD-10-CM | POA: Diagnosis not present

## 2016-02-28 DIAGNOSIS — Z801 Family history of malignant neoplasm of trachea, bronchus and lung: Secondary | ICD-10-CM | POA: Diagnosis not present

## 2016-02-28 DIAGNOSIS — Z888 Allergy status to other drugs, medicaments and biological substances status: Secondary | ICD-10-CM | POA: Diagnosis not present

## 2016-02-28 DIAGNOSIS — Z8673 Personal history of transient ischemic attack (TIA), and cerebral infarction without residual deficits: Secondary | ICD-10-CM | POA: Diagnosis not present

## 2016-02-28 DIAGNOSIS — R05 Cough: Secondary | ICD-10-CM | POA: Diagnosis not present

## 2016-02-28 DIAGNOSIS — Z8249 Family history of ischemic heart disease and other diseases of the circulatory system: Secondary | ICD-10-CM | POA: Diagnosis not present

## 2016-02-28 DIAGNOSIS — K219 Gastro-esophageal reflux disease without esophagitis: Secondary | ICD-10-CM | POA: Diagnosis not present

## 2016-02-28 DIAGNOSIS — Z87442 Personal history of urinary calculi: Secondary | ICD-10-CM | POA: Diagnosis not present

## 2016-02-28 DIAGNOSIS — R509 Fever, unspecified: Secondary | ICD-10-CM | POA: Diagnosis not present

## 2016-02-28 DIAGNOSIS — Z87891 Personal history of nicotine dependence: Secondary | ICD-10-CM | POA: Diagnosis not present

## 2016-02-28 DIAGNOSIS — F0391 Unspecified dementia with behavioral disturbance: Secondary | ICD-10-CM | POA: Diagnosis not present

## 2016-02-28 DIAGNOSIS — J440 Chronic obstructive pulmonary disease with acute lower respiratory infection: Secondary | ICD-10-CM | POA: Diagnosis not present

## 2016-02-28 DIAGNOSIS — Z9842 Cataract extraction status, left eye: Secondary | ICD-10-CM | POA: Diagnosis not present

## 2016-02-28 DIAGNOSIS — Z9889 Other specified postprocedural states: Secondary | ICD-10-CM | POA: Diagnosis not present

## 2016-02-28 LAB — RESPIRATORY PANEL BY PCR
ADENOVIRUS-RVPPCR: NOT DETECTED
Bordetella pertussis: NOT DETECTED
CORONAVIRUS 229E-RVPPCR: NOT DETECTED
CORONAVIRUS HKU1-RVPPCR: NOT DETECTED
CORONAVIRUS NL63-RVPPCR: NOT DETECTED
CORONAVIRUS OC43-RVPPCR: NOT DETECTED
Chlamydophila pneumoniae: NOT DETECTED
INFLUENZA B-RVPPCR: NOT DETECTED
Influenza A: NOT DETECTED
Metapneumovirus: NOT DETECTED
Mycoplasma pneumoniae: NOT DETECTED
PARAINFLUENZA VIRUS 1-RVPPCR: NOT DETECTED
PARAINFLUENZA VIRUS 2-RVPPCR: NOT DETECTED
Parainfluenza Virus 3: NOT DETECTED
Parainfluenza Virus 4: NOT DETECTED
Respiratory Syncytial Virus: NOT DETECTED
Rhinovirus / Enterovirus: NOT DETECTED

## 2016-02-28 LAB — GLUCOSE, CAPILLARY
GLUCOSE-CAPILLARY: 117 mg/dL — AB (ref 65–99)
GLUCOSE-CAPILLARY: 147 mg/dL — AB (ref 65–99)
Glucose-Capillary: 123 mg/dL — ABNORMAL HIGH (ref 65–99)
Glucose-Capillary: 151 mg/dL — ABNORMAL HIGH (ref 65–99)

## 2016-02-28 NOTE — Progress Notes (Signed)
[facility]  PROGRESS NOTE  Chad [name] [id number] DOB: 1929/10/21 DOA: 02/27/2016 PCP: Hoyt Koch, MD   Brief HPI:    83 y.o. male with medical history significant of acid reflux disease, Barrett's esophagus, esophageal stenosis, osteoarthritis, basal cell CA of the nose, bilateral carotid artery stenosis, COPD, CAD, type 2 diabetes, diverticulosis, nonalcoholic fatty liver disease, glaucoma, hyperlipidemia, hypertension, urolithiasis, TIA, CVA, dementia, multiple pneumonia episodes (most recent 3 months ago) who is being brought to the emergency department for evaluation of generalized weakness, sore throat, mild cough of productive sputum, however the patient is unable to expectorate and fever since earlier today in the morning. He is unable to provide history due to dementia and details are provided by his wife. His wife also mentions, that he had elevated blood pressure yesterday and had to use when necessary clonidine to treat.    Subjective   This morning patient denies chest pain or shortness of breath.   Assessment/Plan:     1. Fever- patient presented with fever, which is now resolved, suspect bronchitis with early pneumonia. Chest x-ray was clear. Repeat chest x-ray is ordered and is currently pending. Respiratory panel by PCR is negative. Influenza PCR is negative. Continue with empiric Rocephin and Zithromax. Follow blood cultures. 2. Generalized weakness- secondary to above, and poor by mouth intake. Will order PT OT evaluation 3. Moderate dementia with better disturbance- patient started on lorazepam 0.5 mg every 6 hours when necessary in the hospital, continue Aricept, Remeron 4. Glaucoma-continue Alphagan drops 5. Hypertension-blood pressure is stable, continue amlodipine, clonidine when necessary, losartan and Lasix are on hold due to elevated BUN/creatinine. Follow BMP in a.m. 6. Diabetes mellitus type 2- continue sliding scale insulin with NovoLog,  hold metformin. 7. History of CAD/CVA- continue Plavix 75 mg by mouth daily, statins. 8. Hypomagnesemia- magnesium was 1.6 yesterday which has been replaced.    DVT prophylaxis: Lovenox  Code Status: Full code  Family Communication: No family at bedside   Disposition Plan: Likely home in next 24-48 hours   Consultants:  None  Procedures:  None  Continuous infusions . sodium chloride 100 mL/hr at 02/28/16 0156      Antibiotics:   Anti-infectives    Start     Dose/Rate Route Frequency Ordered Stop   02/27/16 2345  azithromycin (ZITHROMAX) tablet 500 mg     500 mg Oral Daily at bedtime 02/27/16 2331     02/27/16 2345  cefTRIAXone (ROCEPHIN) 1 g in dextrose 5 % 50 mL IVPB     1 g 100 mL/hr over 30 Minutes Intravenous Daily at bedtime 02/27/16 2331         Objective   Vitals:   02/27/16 2220 02/28/16 0450 02/28/16 0804 02/28/16 0857  BP:  (!) 159/67    Pulse:  72    Resp:  20    Temp:  97.6 F (36.4 C)    TempSrc:  Oral    SpO2: 98% 96% 94% 93%  Weight:      Height:        Intake/Output Summary (Last 24 hours) at 02/28/16 1131 Last data filed at 02/28/16 1016  Gross per 24 hour  Intake          1791.67 ml  Output              325 ml  Net          1466.67 ml   Filed Weights   02/27/16 1538 02/27/16 2012  Weight: 74.8  kg (165 lb) 73.1 kg (161 lb 2.5 oz)     Physical Examination:  General exam: Appears calm and comfortable. Respiratory system: Clear to auscultation. Respiratory effort normal. Cardiovascular system:  RRR. No  murmurs, rubs, gallops. No pedal edema. GI system: Abdomen is nondistended, soft and nontender. No organomegaly.  Central nervous system. No focal neurological deficits. 5 x 5 power in all extremities. Skin: No rashes, lesions or ulcers.     Data Reviewed: I have personally reviewed following labs and imaging studies  CBG:  Recent Labs Lab 02/27/16 2332 02/28/16 0754  GLUCAP 142* 117*    CBC:  Recent Labs Lab  02/27/16 1643  WBC 10.6*  NEUTROABS 6.4  HGB 11.5*  HCT 36.0*  MCV 84.9  PLT 123XX123    Basic Metabolic Panel:  Recent Labs Lab 02/27/16 1643 02/27/16 2102  NA 136  --   K 4.8  --   CL 99*  --   CO2 27  --   GLUCOSE 110*  --   BUN 30*  --   CREATININE 1.44*  --   CALCIUM 10.2  --   MG  --  1.6*  PHOS  --  3.8    Recent Results (from the past 240 hour(s))  Respiratory Panel by PCR     Status: None   Collection Time: 02/27/16  7:05 PM  Result Value Ref Range Status   Adenovirus NOT DETECTED NOT DETECTED Final   Coronavirus 229E NOT DETECTED NOT DETECTED Final   Coronavirus HKU1 NOT DETECTED NOT DETECTED Final   Coronavirus NL63 NOT DETECTED NOT DETECTED Final   Coronavirus OC43 NOT DETECTED NOT DETECTED Final   Metapneumovirus NOT DETECTED NOT DETECTED Final   Rhinovirus / Enterovirus NOT DETECTED NOT DETECTED Final   Influenza A NOT DETECTED NOT DETECTED Final   Influenza B NOT DETECTED NOT DETECTED Final   Parainfluenza Virus 1 NOT DETECTED NOT DETECTED Final   Parainfluenza Virus 2 NOT DETECTED NOT DETECTED Final   Parainfluenza Virus 3 NOT DETECTED NOT DETECTED Final   Parainfluenza Virus 4 NOT DETECTED NOT DETECTED Final   Respiratory Syncytial Virus NOT DETECTED NOT DETECTED Final   Bordetella pertussis NOT DETECTED NOT DETECTED Final   Chlamydophila pneumoniae NOT DETECTED NOT DETECTED Final   Mycoplasma pneumoniae NOT DETECTED NOT DETECTED Final    Comment: Performed at [facility], [address], [place],  [address]  Rapid strep screen (not at [facility])     Status: None   Collection Time: 02/27/16  7:05 PM  Result Value Ref Range Status   Streptococcus, Group A Screen (Direct) NEGATIVE NEGATIVE Final    Comment: (NOTE) A Rapid Antigen test may result negative if the antigen level in the sample is below the detection level of this test. The FDA has not cleared this test as a stand-alone test therefore the rapid antigen negative result has  reflexed to a Group A Strep culture.      Liver Function Tests:  Recent Labs Lab 02/27/16 1643  AST 31  ALT 15*  ALKPHOS 64  BILITOT 1.4*  PROT 6.9  ALBUMIN 3.8    Recent Labs Lab 02/27/16 1643  LIPASE 16   No results for input(s): AMMONIA in the last 168 hours.  Cardiac Enzymes: No results for input(s): CKTOTAL, CKMB, CKMBINDEX, TROPONINI in the last 168 hours. BNP (last 3 results)  Recent Labs  06/01/15 1844  BNP 174.9*    ProBNP (last 3 results) No results for input(s): PROBNP  in the last 8760 hours.    Studies: Dg Chest 2 View  Result Date: 02/27/2016 CLINICAL DATA:  Pt c/o weakness, fever, sob x 2 days. Hx copd, htn. EXAM: CHEST  2 VIEW COMPARISON:  12/16/2015 FINDINGS: Heart size is normal. There is bibasilar atelectasis. No focal consolidations or pleural effusions are identified. Large hiatal hernia is present. No evidence for acute cardiopulmonary abnormality. IMPRESSION: No active cardiopulmonary disease. Electronically Signed   By: Nolon Nations M.D.   On: 02/27/2016 16:56    Scheduled Meds: . amLODipine  10 mg Oral Daily  . azithromycin  500 mg Oral QHS  . brimonidine  1 drop Left Eye BID  . cefTRIAXone (ROCEPHIN)  IV  1 g Intravenous QHS  . clopidogrel  75 mg Oral Daily  . donepezil  10 mg Oral QHS  . enoxaparin (LOVENOX) injection  40 mg Subcutaneous Q24H  . insulin aspart  0-9 Units Subcutaneous TID WC  . ipratropium-albuterol  3 mL Nebulization TID  . losartan  100 mg Oral Daily  . mirtazapine  15 mg Oral QHS  . mometasone-formoterol  2 puff Inhalation BID  . pantoprazole  40 mg Oral Daily  . potassium chloride SA  20 mEq Oral Daily  . simvastatin  20 mg Oral q1800      Time spent: 25 min  [facility] Pager [phone]. If 7PM-7AM, please contact night-coverage at [email or web address], Office  [phone]  password [email or web address] 02/28/2016, 11:31 AM  LOS: 0 days

## 2016-02-29 DIAGNOSIS — R509 Fever, unspecified: Secondary | ICD-10-CM

## 2016-02-29 DIAGNOSIS — J40 Bronchitis, not specified as acute or chronic: Secondary | ICD-10-CM

## 2016-02-29 LAB — COMPREHENSIVE METABOLIC PANEL
ALK PHOS: 56 U/L (ref 38–126)
ALT: 16 U/L — ABNORMAL LOW (ref 17–63)
AST: 14 U/L — AB (ref 15–41)
Albumin: 3.5 g/dL (ref 3.5–5.0)
Anion gap: 8 (ref 5–15)
BILIRUBIN TOTAL: 0.7 mg/dL (ref 0.3–1.2)
BUN: 15 mg/dL (ref 6–20)
CALCIUM: 9 mg/dL (ref 8.9–10.3)
CO2: 26 mmol/L (ref 22–32)
Chloride: 103 mmol/L (ref 101–111)
Creatinine, Ser: 1.25 mg/dL — ABNORMAL HIGH (ref 0.61–1.24)
GFR calc Af Amer: 59 mL/min — ABNORMAL LOW (ref >60)
GFR, EST NON AFRICAN AMERICAN: 51 mL/min — AB (ref >60)
Glucose, Bld: 133 mg/dL — ABNORMAL HIGH (ref 65–99)
POTASSIUM: 3.9 mmol/L (ref 3.5–5.1)
Sodium: 137 mmol/L (ref 135–145)
TOTAL PROTEIN: 6.4 g/dL — AB (ref 6.5–8.1)

## 2016-02-29 LAB — CBC
HEMATOCRIT: 34.6 % — AB (ref 39.0–52.0)
HEMOGLOBIN: 11.1 g/dL — AB (ref 13.0–17.0)
MCH: 27.3 pg (ref 26.0–34.0)
MCHC: 32.1 g/dL (ref 30.0–36.0)
MCV: 85.2 fL (ref 78.0–100.0)
Platelets: 160 10*3/uL (ref 150–400)
RBC: 4.06 MIL/uL — ABNORMAL LOW (ref 4.22–5.81)
RDW: 15.6 % — AB (ref 11.5–15.5)
WBC: 9.3 10*3/uL (ref 4.0–10.5)

## 2016-02-29 LAB — GLUCOSE, CAPILLARY
GLUCOSE-CAPILLARY: 123 mg/dL — AB (ref 65–99)
Glucose-Capillary: 130 mg/dL — ABNORMAL HIGH (ref 65–99)

## 2016-02-29 MED ORDER — CEFPODOXIME PROXETIL 200 MG PO TABS: 200.0000 mg | ORAL_TABLET | Freq: Two times a day (BID) | ORAL | 0 refills | Status: DC

## 2016-02-29 NOTE — Discharge Summary (Signed)
Physician Discharge Summary  Chad [name] [id number] DOB: 1929/07/26 DOA: 02/27/2016  PCP: Hoyt Koch, MD  Admit date: 02/27/2016 Discharge date: 02/29/2016  Time spent: 35 minutes  Recommendations for Outpatient Follow-up:  PCP in 1 week  Discharge Diagnoses:  Principal Problem:    Early pneumonia vs Bronchitis Active Problems:   Glaucoma   Essential hypertension   COPD GOLD II    Barrett's esophagus   Controlled diabetes mellitus type 2 with complications ([place])   Hyperlipidemia   Weakness   Anemia   Hypomagnesemia   Discharge Condition: stable  Diet recommendation: DM  Filed Weights   02/27/16 1538 02/27/16 2012  Weight: 74.8 kg (165 lb) 73.1 kg (161 lb 2.5 oz)    History of present illness:  83 y.o.malewith medical history significant of acid reflux disease, Barrett's esophagus, esophageal stenosis, osteoarthritis, basal cell CA of the nose, bilateral carotid artery stenosis, COPD, CAD, type 2 diabetes, diverticulosis, nonalcoholic fatty liver disease, glaucoma, hyperlipidemia, hypertension, urolithiasis, TIA, CVA, dementia, multiple pneumonia episodes (most recent 3 months ago)was brought to the emergency department for evaluation of generalized weakness, sore throat, mild cough of productive sputum, and fever.  Hospital Course:  1. Early pneumonia vs Bronchitis -presented with Fever, cough, congestion, weakness -improved,  Chest x-ray was clear. Respiratory panel by PCR is negative. Influenza PCR is negative, improved on empiric Abx, discharged home on Oral Vantin to complete course  2. Generalized weakness- improved, due to above  3. Moderate dementia with better disturbance- -stable, continue Aricept, Remeron  4. Hypertension-blood pressure is stable, continue amlodipine, -resumed losartan and Lasix at discharge  5. Diabetes mellitus type 2-  -stable, resumed metformin.  6. History of CAD/CVA-  -continue Plavix 75 mg by mouth daily,  statins.  7. Hypomagnesemia- magnesium was 1.6 yesterday which has been replaced  Discharge Exam: Vitals:   02/29/16 0800 02/29/16 1232  BP:  (!) 141/60  Pulse: 74   Resp: 18   Temp:      General: AAOx3 Cardiovascular:S1S2/RRR Respiratory: few scattered ronchi  Discharge Instructions   Discharge Instructions    Diet - low sodium heart healthy    Complete by:  As directed    Diet Carb Modified    Complete by:  As directed    Increase activity slowly    Complete by:  As directed      Current Discharge Medication List    START taking these medications   Details  cefpodoxime (VANTIN) 200 MG tablet Take 1 tablet (200 mg total) by mouth 2 (two) times daily. For 4days Qty: 8 tablet, Refills: 0      CONTINUE these medications which have NOT CHANGED   Details  acetaminophen (TYLENOL) 500 MG tablet Take 1,000 mg by mouth every 6 (six) hours as needed for fever.    albuterol (PROVENTIL HFA;VENTOLIN HFA) 108 (90 Base) MCG/ACT inhaler Inhale 2 puffs into the lungs every 4 (four) hours as needed for wheezing or shortness of breath.    albuterol (PROVENTIL) (2.5 MG/3ML) 0.083% nebulizer solution Take 2.5 mg by nebulization every 4 (four) hours as needed for wheezing or shortness of breath.    amLODipine (NORVASC) 10 MG tablet Take 1 tablet (10 mg total) by mouth daily. Qty: 30 tablet, Refills: 6    brimonidine (ALPHAGAN) 0.15 % ophthalmic solution Place 1 drop into the left eye 2 (two) times daily. Qty: 5 mL, Refills: 12    budesonide-formoterol (SYMBICORT) 160-4.5 MCG/ACT inhaler Inhale 2 puffs into the lungs 2 (two) times  daily.    Cholecalciferol (VITAMIN D) 1000 UNITS capsule Take 1,000 Units by mouth daily.      cloNIDine (CATAPRES) 0.2 MG tablet Take 0.5 tablets (0.1 mg total) by mouth daily as needed (for SBP >140). Only take 1/2 tablet if systolic is over [id number]. Qty: 30 tablet, Refills: 0    clopidogrel (PLAVIX) 75 MG tablet TAKE 1 TABLET BY MOUTH EVERY DAY Qty: 90  tablet, Refills: 3   Associated Diagnoses: Cerebral thrombosis with cerebral infarction (HCC)    dextromethorphan-guaiFENesin (MUCINEX DM) 30-600 MG 12hr tablet Take 1 tablet by mouth 2 (two) times daily as needed for cough.    donepezil (ARICEPT) 10 MG tablet Take 1 tablet (10 mg total) by mouth at bedtime. Qty: 90 tablet, Refills: 3   Associated Diagnoses: Moderate dementia with behavioral disturbance    fluticasone (FLONASE) 50 MCG/ACT nasal spray Place 2 sprays into both nostrils 2 (two) times daily as needed for allergies.     furosemide (LASIX) 40 MG tablet Take 40 mg by mouth daily.    loratadine (CLARITIN) 10 MG tablet Take 10 mg by mouth daily as needed for allergies.    LORazepam (ATIVAN) 0.5 MG tablet Take 1 tablet (0.5 mg total) by mouth at bedtime. Qty: 30 tablet, Refills: 3    losartan (COZAAR) 100 MG tablet Take 1 tablet (100 mg total) by mouth daily. Qty: 90 tablet, Refills: 1    metFORMIN (GLUCOPHAGE) 500 MG tablet Take 1 tablet (500 mg total) by mouth 2 (two) times daily with a meal. Qty: 180 tablet, Refills: 3    mirtazapine (REMERON) 7.5 MG tablet Take 15 mg by mouth at bedtime.     Multiple Vitamins-Minerals (PRESERVISION AREDS 2) CAPS Take 1 capsule by mouth 2 (two) times daily.    omeprazole (PRILOSEC) 20 MG capsule Take 20 mg by mouth 2 (two) times daily before a meal.     potassium chloride SA (K-DUR,KLOR-CON) 20 MEQ tablet Take 1 tablet (20 mEq total) by mouth daily. Qty: 90 tablet, Refills: 1    promethazine (PHENERGAN) 25 MG tablet Take 25 mg by mouth every 8 (eight) hours as needed for nausea or vomiting.    simvastatin (ZOCOR) 20 MG tablet TAKE 1 TABLET BY MOUTH DAILY AT 6 PM Qty: 90 tablet, Refills: 0    vitamin B-12 (CYANOCOBALAMIN) 1000 MCG tablet Take 1,000 mcg by mouth daily.       Allergies  Allergen Reactions  . Moxifloxacin Anaphylaxis    Avelox REACTION: anaphylaxis  . Quinolones Anaphylaxis  . Sulfonamide Derivatives Hives  .  Timolol Maleate Other (See Comments)    Causes severe chest congestion  . Amoxicillin Other (See Comments)    Rash, sweating, sob Has patient had a PCN reaction causing immediate rash, facial/tongue/throat swelling, SOB or lightheadedness with hypotension: Yes Has patient had a PCN reaction causing severe rash involving mucus membranes or skin necrosis: Yes Has patient had a PCN reaction that required hospitalization Yes Has patient had a PCN reaction occurring within the last 10 years: Yes If all of the above answers are "NO", then may proceed with Cephalosporin use.   . Augmentin [Amoxicillin-Pot Clavulanate] Other (See Comments)    Anaphylaxis  Has patient had a PCN reaction causing immediate rash, facial/tongue/throat swelling, SOB or lightheadedness with hypotension: Yes Has patient had a PCN reaction causing severe rash involving mucus membranes or skin necrosis: Yes Has patient had a PCN reaction that required hospitalization Yes Has patient had a PCN reaction occurring within the  last 10 years: Yes If all of the above answers are "NO", then may proceed with Cephalosporin use.   . Sulfa Antibiotics Rash   Follow-up Information    Hoyt Koch, MD. Schedule an appointment as soon as possible for a visit in 1 week(s).   Specialty:  Internal Medicine Contact information: [place] [id number] [phone]            The results of significant diagnostics from this hospitalization (including imaging, microbiology, ancillary and laboratory) are listed below for reference.    Significant Diagnostic Studies: Dg Chest 2 View  Result Date: 02/28/2016 CLINICAL DATA:  Fever, generalized weakness, sore throat, mild cough of productive sputum. EXAM: CHEST  2 VIEW COMPARISON:  Chest x-rays dated 02/27/2016 and 12/16/2015. FINDINGS: Heart size and mediastinal contours are stable. Hiatal hernia is again appreciated, moderate to large in size. Atherosclerotic  changes again noted at the aortic arch. Prominent pulmonary arteries suggests chronic pulmonary artery hypertension. Probable chronic atelectasis/scarring at the left lung base. No new lung findings. No pleural effusion or pneumothorax seen. No acute or suspicious osseous finding. IMPRESSION: No active cardiopulmonary disease. No evidence of pneumonia or pulmonary edema. Aortic atherosclerosis. Hiatal hernia. Probable chronic pulmonary artery hypertension. Electronically Signed   By: Franki Cabot M.D.   On: 02/28/2016 13:18   Dg Chest 2 View  Result Date: 02/27/2016 CLINICAL DATA:  Pt c/o weakness, fever, sob x 2 days. Hx copd, htn. EXAM: CHEST  2 VIEW COMPARISON:  12/16/2015 FINDINGS: Heart size is normal. There is bibasilar atelectasis. No focal consolidations or pleural effusions are identified. Large hiatal hernia is present. No evidence for acute cardiopulmonary abnormality. IMPRESSION: No active cardiopulmonary disease. Electronically Signed   By: Nolon Nations M.D.   On: 02/27/2016 16:56    Microbiology: Recent Results (from the past 240 hour(s))  Culture, blood (routine x 2)     Status: None (Preliminary result)   Collection Time: 02/27/16  4:43 PM  Result Value Ref Range Status   Specimen Description BLOOD RIGHT HAND  Final   Special Requests BOTTLES DRAWN AEROBIC AND ANAEROBIC 5CC  Final   Culture   Final    NO GROWTH < 24 HOURS Performed at [facility], [place] [address] [place], [place] [address]    Report Status PENDING  Incomplete  Culture, blood (routine x 2)     Status: None (Preliminary result)   Collection Time: 02/27/16  4:44 PM  Result Value Ref Range Status   Specimen Description BLOOD LEFT HAND  Final   Special Requests BOTTLES DRAWN AEROBIC AND ANAEROBIC 5CC  Final   Culture   Final    NO GROWTH < 24 HOURS Performed at [facility], [place] [address], [place], [place] [address]    Report Status PENDING  Incomplete  Respiratory Panel by PCR     Status:  None   Collection Time: 02/27/16  7:05 PM  Result Value Ref Range Status   Adenovirus NOT DETECTED NOT DETECTED Final   Coronavirus 229E NOT DETECTED NOT DETECTED Final   Coronavirus HKU1 NOT DETECTED NOT DETECTED Final   Coronavirus NL63 NOT DETECTED NOT DETECTED Final   Coronavirus OC43 NOT DETECTED NOT DETECTED Final   Metapneumovirus NOT DETECTED NOT DETECTED Final   Rhinovirus / Enterovirus NOT DETECTED NOT DETECTED Final   Influenza A NOT DETECTED NOT DETECTED Final   Influenza B NOT DETECTED NOT DETECTED Final   Parainfluenza Virus 1 NOT DETECTED NOT  DETECTED Final   Parainfluenza Virus 2 NOT DETECTED NOT DETECTED Final   Parainfluenza Virus 3 NOT DETECTED NOT DETECTED Final   Parainfluenza Virus 4 NOT DETECTED NOT DETECTED Final   Respiratory Syncytial Virus NOT DETECTED NOT DETECTED Final   Bordetella pertussis NOT DETECTED NOT DETECTED Final   Chlamydophila pneumoniae NOT DETECTED NOT DETECTED Final   Mycoplasma pneumoniae NOT DETECTED NOT DETECTED Final    Comment: Performed at [facility], [place] [address] [place], [place] [address]  Rapid strep screen (not at [facility])     Status: None   Collection Time: 02/27/16  7:05 PM  Result Value Ref Range Status   Streptococcus, Group A Screen (Direct) NEGATIVE NEGATIVE Final    Comment: (NOTE) A Rapid Antigen test may result negative if the antigen level in the sample is below the detection level of this test. The FDA has not cleared this test as a stand-alone test therefore the rapid antigen negative result has reflexed to a Group A Strep culture.   Culture, group A strep     Status: None (Preliminary result)   Collection Time: 02/27/16  7:05 PM  Result Value Ref Range Status   Specimen Description THROAT  Final   Special Requests NONE Reflexed from [id number]  Final   Culture   Final    TOO YOUNG TO READ Performed at [facility], [place] [address], [place], [place] [address]    Report Status PENDING  Incomplete      Labs: Basic Metabolic Panel:  Recent Labs Lab 02/27/16 1643 02/27/16 2102 02/29/16 0503  NA 136  --  137  K 4.8  --  3.9  CL 99*  --  103  CO2 27  --  26  GLUCOSE 110*  --  133*  BUN 30*  --  15  CREATININE 1.44*  --  1.25*  CALCIUM 10.2  --  9.0  MG  --  1.6*  --   PHOS  --  3.8  --    Liver Function Tests:  Recent Labs Lab 02/27/16 1643 02/29/16 0503  AST 31 14*  ALT 15* 16*  ALKPHOS 64 56  BILITOT 1.4* 0.7  PROT 6.9 6.4*  ALBUMIN 3.8 3.5    Recent Labs Lab 02/27/16 1643  LIPASE 16   No results for input(s): AMMONIA in the last 168 hours. CBC:  Recent Labs Lab 02/27/16 1643 02/29/16 0503  WBC 10.6* 9.3  NEUTROABS 6.4  --   HGB 11.5* 11.1*  HCT 36.0* 34.6*  MCV 84.9 85.2  PLT 177 160   Cardiac Enzymes: No results for input(s): CKTOTAL, CKMB, CKMBINDEX, TROPONINI in the last 168 hours. BNP: BNP (last 3 results)  Recent Labs  06/01/15 1844  BNP 174.9*    ProBNP (last 3 results) No results for input(s): PROBNP in the last 8760 hours.  CBG:  Recent Labs Lab 02/28/16 1238 02/28/16 1714 02/28/16 2146 02/29/16 0717 02/29/16 1239  GLUCAP 147* 123* 151* 123* 130*       Signed:  Cressie Betzler MD.  [facility] 02/29/2016, 1:05 PM

## 2016-02-29 NOTE — Progress Notes (Signed)
Spoke with pt's wife concerning HH, she selected [facility].  Referral given to in house rep.

## 2016-02-29 NOTE — Evaluation (Signed)
Physical Therapy Evaluation Patient Details Name: Chad [name] MRN: [id number] DOB: 1929/07/01 Today's Date: 02/29/2016   History of Present Illness  Chad [name] is a 83 y.o. male with medical history significant of acid reflux disease, Barrett's esophagus, esophageal stenosis, osteoarthritis, basal cell CA of the nose, bilateral carotid artery stenosis, COPD, CAD, type 2 diabetes, diverticulosis, nonalcoholic fatty liver disease, glaucoma, hyperlipidemia, hypertension, urolithiasis, TIA, CVA, dementia, multiple pneumonia episodes (most recent 3 months ago) who is being brought to the emergency department for evaluation of generalized weakness, sore throat, mild cough of productive sputum  Clinical Impression  The patient is very pleasant, ambulated with 1 assist and RW x 100'. Wife not present. Recommend HHPT. Needs Rw if does not have.  Pt admitted with above diagnosis. Pt currently with functional limitations due to the deficits listed below (see PT Problem List).  Pt will benefit from skilled PT to increase their independence and safety with mobility to allow discharge to the venue listed below.       Follow Up Recommendations Home health PT    Equipment Recommendations  RW if does not have   Recommendations for Other Services       Precautions / Restrictions Precautions Precautions: Fall      Mobility  Bed Mobility Overal bed mobility: Needs Assistance Bed Mobility: Supine to Sit     Supine to sit: Min assist     General bed mobility comments: extra time to complete  Transfers Overall transfer level: Needs assistance Equipment used: Rolling walker (2 wheeled) Transfers: Sit to/from Stand Sit to Stand: Min assist         General transfer comment: cues to rise, hand placement  Ambulation/Gait Ambulation/Gait assistance: Min assist Ambulation Distance (Feet): 100 Feet Assistive device: Rolling walker (2 wheeled) Gait Pattern/deviations: Step-to  pattern;Step-through pattern;Decreased weight shift to right;Decreased weight shift to left;Shuffle     General Gait Details: frequent assist to adjust RW in straight line., veers to [employer]    Modified Rankin (Stroke Patients Only)       Balance Overall balance assessment: Needs assistance Sitting-balance support: Feet supported;No upper extremity supported Sitting balance-Leahy Scale: Fair     Standing balance support: During functional activity;Bilateral upper extremity supported Standing balance-Leahy Scale: Poor                               Pertinent Vitals/Pain Pain Assessment: No/denies pain    Home Living Family/patient expects to be discharged to:: Private residence Living Arrangements: Spouse/significant other Available Help at Discharge: Family;Available 24 hours/day Type of Home: House Home Access: Stairs to enter   [employer] of Steps: 1     Additional Comments: wife  not in room, patient  not able to provide complete history. infor re: home from previous admission    Prior Function Level of Independence: Needs assistance               Hand Dominance        Extremity/Trunk Assessment   Upper Extremity Assessment Upper Extremity Assessment: Generalized weakness    Lower Extremity Assessment Lower Extremity Assessment: Generalized weakness    Cervical / Trunk Assessment Cervical / Trunk Assessment: Normal  Communication    decreased expression  Cognition Arousal/Alertness: Awake/alert Behavior During Therapy: WFL for tasks assessed/performed Overall Cognitive Status: No family/caregiver present to determine baseline cognitive functioning  General Comments: constantly stating"I',m sorry", oriented to [facility] hospital    General Comments      Exercises     Assessment/Plan    PT Assessment Patient wiill benefit from PT  PT Problem List Decreased  strength;Decreased balance;Decreased mobility          PT Treatment Interventions   gait and balance training   PT Goals (Current goals can be found in the Care Plan section)  Acute Rehab PT Goals Patient Stated Goal: agreed to get up due to bed is wet from urine PT Goal Formulation: patient unable.  Time for goals 03/08/16 Good to achieve   Frequency  3 x week   Barriers to discharge        Co-evaluation               End of Session Equipment Utilized During Treatment: Gait belt Activity Tolerance: Patient tolerated treatment well Patient left: in chair;with call bell/phone within reach;with chair alarm set Nurse Communication: Mobility status         Time: [id number] PT Time Calculation (min) (ACUTE ONLY): 34 min   Charges:   PT Evaluation $PT Eval Low Complexity: 1 Procedure PT Treatments $Gait Training: 8-22 mins   PT G Codes:        Claretha Cooper 02/29/2016, 2:55 PM Tresa Endo PT [phone]

## 2016-03-01 ENCOUNTER — Telehealth: Payer: Self-pay | Admitting: *Deleted

## 2016-03-01 LAB — CULTURE, GROUP A STREP (THRC)

## 2016-03-01 NOTE — Telephone Encounter (Signed)
Tried calling pt to set up TCM hosp f/u appt no answer LMOM RTC.../lmb 

## 2016-03-02 NOTE — Telephone Encounter (Signed)
Transition Care Management Follow-up Telephone Call   Date discharged? 02/29/16   How have you been since you were released from the hospital? Called pt spoke w/wife she states he is doing ok   Do you understand why you were in the hospital? YES   Do you understand the discharge instructions? YES   Where were you discharged to? Home   Items Reviewed:  Medications reviewed: YES  Allergies reviewed: YES  Dietary changes reviewed: YES, diabetic  Referrals reviewed: No referral needed   Functional Questionnaire:   Activities of Daily Living (ADLs):   She states he are independent in the following: ambulation, bathing and hygiene, feeding, continence, grooming, toileting and dressing States he doesn't require assistance does good    Any transportation issues/concerns?: NO   Any patient concerns? NO   Confirmed importance and date/time of follow-up visits scheduled YES, 03/07/16  Provider Appointment booked with Dr. Sharlet Salina  Confirmed with patient if condition begins to worsen call PCP or go to the ER.  Patient was given the office number and encouraged to call back with question or concerns.  : YES

## 2016-03-03 LAB — CULTURE, BLOOD (ROUTINE X 2)
CULTURE: NO GROWTH
Culture: NO GROWTH

## 2016-03-05 ENCOUNTER — Other Ambulatory Visit: Payer: Self-pay | Admitting: Internal Medicine

## 2016-03-07 ENCOUNTER — Encounter: Payer: Self-pay | Admitting: Neurology

## 2016-03-07 ENCOUNTER — Encounter: Payer: Self-pay | Admitting: Internal Medicine

## 2016-03-07 ENCOUNTER — Ambulatory Visit (INDEPENDENT_AMBULATORY_CARE_PROVIDER_SITE_OTHER): Payer: PPO | Admitting: Internal Medicine

## 2016-03-07 DIAGNOSIS — I10 Essential (primary) hypertension: Secondary | ICD-10-CM | POA: Diagnosis not present

## 2016-03-07 DIAGNOSIS — J189 Pneumonia, unspecified organism: Secondary | ICD-10-CM | POA: Diagnosis not present

## 2016-03-07 DIAGNOSIS — E119 Type 2 diabetes mellitus without complications: Secondary | ICD-10-CM

## 2016-03-07 DIAGNOSIS — I639 Cerebral infarction, unspecified: Secondary | ICD-10-CM

## 2016-03-07 DIAGNOSIS — F329 Major depressive disorder, single episode, unspecified: Secondary | ICD-10-CM

## 2016-03-07 DIAGNOSIS — R531 Weakness: Secondary | ICD-10-CM

## 2016-03-07 DIAGNOSIS — E785 Hyperlipidemia, unspecified: Secondary | ICD-10-CM

## 2016-03-07 DIAGNOSIS — I69998 Other sequelae following unspecified cerebrovascular disease: Secondary | ICD-10-CM | POA: Diagnosis not present

## 2016-03-07 DIAGNOSIS — F32A Depression, unspecified: Secondary | ICD-10-CM

## 2016-03-07 DIAGNOSIS — E118 Type 2 diabetes mellitus with unspecified complications: Secondary | ICD-10-CM

## 2016-03-07 MED ORDER — DOXYCYCLINE HYCLATE 100 MG PO TABS: 100.0000 mg | ORAL_TABLET | Freq: Two times a day (BID) | ORAL | 0 refills | Status: DC

## 2016-03-07 MED ORDER — PAROXETINE HCL 20 MG PO TABS
20.0000 mg | ORAL_TABLET | Freq: Every day | ORAL | 6 refills | Status: AC
Start: 2016-03-07 — End: ?

## 2016-03-07 NOTE — Progress Notes (Signed)
Pre visit review using our clinic review tool, if applicable. No additional management support is needed unless otherwise documented below in the visit note. 

## 2016-03-07 NOTE — Progress Notes (Signed)
   Subjective:    Patient ID: Chad [name], male    DOB: 1929-07-09, 83 y.o.   MRN: [id number]  HPI The patient is an 83 YO man coming in for hospital follow up (in for stroke and recovery as well as pneumonia with vantin). He has seen his neurologist since that time and they have suggested treating for depression more to see if this would improve his QOL. He does have moderate to severe dementia with pseudobulbar affect and does burst into tears at times for no reason. He is not very interactive even at home with his wife. She has not seen much difference with the remeron but it is hard for her to say with the changes. He is not having breathing problems since leaving the hospital. He is still coughing a lot though and she is concerned that he is not improving from the pneumonia. No fevers or chills. No chest pains or stomach complaints. He is still having more weakness than prior to the stroke but working with PT to help with this some. They were discharged with insulin and they want to know if they have to continue with this. They are not checking sugars much but giving daily insulin. No new stroke symptoms. They used to take nudexa for the PBA and it helped with some of the symptoms but they cannot afford it.   PMH, [facility], social history reviewed and updated.   Review of Systems  Unable to perform ROS: Dementia  Respiratory: Positive for cough. Negative for chest tightness, shortness of breath and wheezing.   Cardiovascular: Negative.   Gastrointestinal: Negative.   Musculoskeletal: Positive for gait problem.  Skin: Negative.   Neurological: Positive for weakness. Negative for dizziness, light-headedness, numbness and headaches.  Psychiatric/Behavioral:       Withdrawn and with crying spells random       Objective:   Physical Exam  Constitutional: He appears well-developed and well-nourished.  HENT:  Head: Normocephalic and atraumatic.  Right Ear: External ear normal.  Left Ear:  External ear normal.  Eyes: EOM are normal.  Neck: Normal range of motion. No JVD present.  Cardiovascular: Normal rate and regular rhythm.   Pulmonary/Chest: Effort normal.  Rhonchi bilaterally which do not clear with coughing.   Abdominal: Soft. Bowel sounds are normal. He exhibits no distension. There is no tenderness. There is no rebound.  Musculoskeletal: He exhibits no edema.  Lymphadenopathy:    He has no cervical adenopathy.  Neurological: He is alert.  Able to follow 1 step commands  Skin: Skin is warm and dry.   Vitals:   03/07/16 1355  BP: 130/70  Pulse: (!) 42  Temp: 97.8 F (36.6 C)  TempSrc: Oral  SpO2: 97%  Weight: 160 lb (72.6 kg)  Height: 5\' 9"  (1.753 m)      Assessment & Plan:

## 2016-03-07 NOTE — Patient Instructions (Signed)
We will switch the remeron with the paxil to see if this helps more.   We have sent in the doxycycline to take. 1 pill twice a day for 1 week.

## 2016-03-10 ENCOUNTER — Ambulatory Visit: Payer: Medicare HMO | Admitting: Neurology

## 2016-03-10 ENCOUNTER — Encounter: Payer: Self-pay | Admitting: Internal Medicine

## 2016-03-10 NOTE — Assessment & Plan Note (Signed)
LDL at goal on simvastatin. Continue

## 2016-03-10 NOTE — Assessment & Plan Note (Deleted)
Will have them stop taking insulin daily since they are not checking and his sugars were at goal on orals only. Resume his prior meds.

## 2016-03-10 NOTE — Assessment & Plan Note (Addendum)
Will have them stop taking insulin daily since they are not checking and his sugars were at goal on orals only. Resume his prior meds. Complicated by prior stroke.

## 2016-03-10 NOTE — Assessment & Plan Note (Signed)
It is unclear if his dementia is advancing causing the behavioral changes or if he has some untreated depression. They request to try paxil which we will switch the remeron to paxil.

## 2016-03-10 NOTE — Assessment & Plan Note (Signed)
Lungs still sound congested with rhonchi and wheezing. Will change to doxycycline for 1 week. Then CXR in about 1 month for clearance. He could be having some aspiration as well with the stroke and dementia.

## 2016-03-10 NOTE — Assessment & Plan Note (Signed)
BP at goal on amlodipine and clonidine and lasix and losartan.

## 2016-03-10 NOTE — Assessment & Plan Note (Signed)
He is still working with PT/OT and is improving gradually. No new symptoms since leaving the hospital.

## 2016-03-16 ENCOUNTER — Encounter (INDEPENDENT_AMBULATORY_CARE_PROVIDER_SITE_OTHER): Payer: PPO | Admitting: Ophthalmology

## 2016-03-16 DIAGNOSIS — H353211 Exudative age-related macular degeneration, right eye, with active choroidal neovascularization: Secondary | ICD-10-CM

## 2016-03-16 LAB — HM DIABETES EYE EXAM

## 2016-03-16 NOTE — Progress Notes (Unsigned)
Results entered and sent to scan  

## 2016-03-18 ENCOUNTER — Telehealth: Payer: Self-pay | Admitting: Emergency Medicine

## 2016-03-18 NOTE — Telephone Encounter (Signed)
Pts wife states the [place] came out to work with him. He has been having more mini strokes. He was not able to work with him much. [facility] She asked that you give her a call back so she can discuss a few things with you. Thanks.

## 2016-03-18 NOTE — Telephone Encounter (Signed)
I'm okay with hospice if they are wanting that. Another option is palliative care who comes in for symptom management and is more long term than hospice is.

## 2016-03-19 ENCOUNTER — Emergency Department (HOSPITAL_COMMUNITY): Payer: PPO

## 2016-03-19 ENCOUNTER — Emergency Department (HOSPITAL_COMMUNITY)
Admission: EM | Admit: 2016-03-19 | Discharge: 2016-03-19 | Disposition: A | Discharge status: Home / Self Care | Payer: PPO | Attending: Emergency Medicine | Admitting: Emergency Medicine

## 2016-03-19 ENCOUNTER — Encounter (HOSPITAL_COMMUNITY): Payer: Self-pay | Admitting: Emergency Medicine

## 2016-03-19 DIAGNOSIS — Z8673 Personal history of transient ischemic attack (TIA), and cerebral infarction without residual deficits: Secondary | ICD-10-CM | POA: Diagnosis not present

## 2016-03-19 DIAGNOSIS — R1311 Dysphagia, oral phase: Secondary | ICD-10-CM | POA: Diagnosis not present

## 2016-03-19 DIAGNOSIS — F319 Bipolar disorder, unspecified: Secondary | ICD-10-CM | POA: Diagnosis not present

## 2016-03-19 DIAGNOSIS — I10 Essential (primary) hypertension: Secondary | ICD-10-CM | POA: Diagnosis not present

## 2016-03-19 DIAGNOSIS — Z8701 Personal history of pneumonia (recurrent): Secondary | ICD-10-CM | POA: Diagnosis not present

## 2016-03-19 DIAGNOSIS — E1121 Type 2 diabetes mellitus with diabetic nephropathy: Secondary | ICD-10-CM | POA: Diagnosis not present

## 2016-03-19 DIAGNOSIS — K219 Gastro-esophageal reflux disease without esophagitis: Secondary | ICD-10-CM | POA: Diagnosis not present

## 2016-03-19 DIAGNOSIS — K2270 Barrett's esophagus without dysplasia: Secondary | ICD-10-CM | POA: Diagnosis not present

## 2016-03-19 DIAGNOSIS — J449 Chronic obstructive pulmonary disease, unspecified: Secondary | ICD-10-CM | POA: Diagnosis not present

## 2016-03-19 DIAGNOSIS — M6281 Muscle weakness (generalized): Secondary | ICD-10-CM | POA: Diagnosis not present

## 2016-03-19 DIAGNOSIS — E119 Type 2 diabetes mellitus without complications: Secondary | ICD-10-CM | POA: Diagnosis not present

## 2016-03-19 DIAGNOSIS — R262 Difficulty in walking, not elsewhere classified: Secondary | ICD-10-CM | POA: Diagnosis not present

## 2016-03-19 DIAGNOSIS — F339 Major depressive disorder, recurrent, unspecified: Secondary | ICD-10-CM | POA: Diagnosis not present

## 2016-03-19 DIAGNOSIS — H409 Unspecified glaucoma: Secondary | ICD-10-CM | POA: Diagnosis not present

## 2016-03-19 DIAGNOSIS — M1990 Unspecified osteoarthritis, unspecified site: Secondary | ICD-10-CM | POA: Diagnosis not present

## 2016-03-19 DIAGNOSIS — R404 Transient alteration of awareness: Secondary | ICD-10-CM | POA: Diagnosis not present

## 2016-03-19 DIAGNOSIS — Z7984 Long term (current) use of oral hypoglycemic drugs: Secondary | ICD-10-CM | POA: Insufficient documentation

## 2016-03-19 DIAGNOSIS — F0150 Vascular dementia without behavioral disturbance: Secondary | ICD-10-CM | POA: Diagnosis not present

## 2016-03-19 DIAGNOSIS — K760 Fatty (change of) liver, not elsewhere classified: Secondary | ICD-10-CM | POA: Diagnosis not present

## 2016-03-19 DIAGNOSIS — I69319 Unspecified symptoms and signs involving cognitive functions following cerebral infarction: Secondary | ICD-10-CM | POA: Diagnosis not present

## 2016-03-19 DIAGNOSIS — E785 Hyperlipidemia, unspecified: Secondary | ICD-10-CM | POA: Diagnosis not present

## 2016-03-19 DIAGNOSIS — Z87891 Personal history of nicotine dependence: Secondary | ICD-10-CM | POA: Diagnosis not present

## 2016-03-19 DIAGNOSIS — H548 Legal blindness, as defined in USA: Secondary | ICD-10-CM | POA: Diagnosis not present

## 2016-03-19 DIAGNOSIS — Z79899 Other long term (current) drug therapy: Secondary | ICD-10-CM | POA: Diagnosis not present

## 2016-03-19 DIAGNOSIS — Z7902 Long term (current) use of antithrombotics/antiplatelets: Secondary | ICD-10-CM | POA: Insufficient documentation

## 2016-03-19 DIAGNOSIS — F482 Pseudobulbar affect: Secondary | ICD-10-CM | POA: Diagnosis not present

## 2016-03-19 DIAGNOSIS — F419 Anxiety disorder, unspecified: Secondary | ICD-10-CM | POA: Diagnosis not present

## 2016-03-19 DIAGNOSIS — I69393 Ataxia following cerebral infarction: Secondary | ICD-10-CM | POA: Diagnosis not present

## 2016-03-19 DIAGNOSIS — F0391 Unspecified dementia with behavioral disturbance: Secondary | ICD-10-CM | POA: Diagnosis not present

## 2016-03-19 DIAGNOSIS — I6529 Occlusion and stenosis of unspecified carotid artery: Secondary | ICD-10-CM | POA: Diagnosis not present

## 2016-03-19 DIAGNOSIS — R531 Weakness: Secondary | ICD-10-CM | POA: Diagnosis not present

## 2016-03-19 DIAGNOSIS — R488 Other symbolic dysfunctions: Secondary | ICD-10-CM | POA: Diagnosis not present

## 2016-03-19 LAB — COMPREHENSIVE METABOLIC PANEL
ALBUMIN: 4.6 g/dL (ref 3.5–5.0)
ALT: 19 U/L (ref 17–63)
AST: 19 U/L (ref 15–41)
Alkaline Phosphatase: 79 U/L (ref 38–126)
Anion gap: 14 (ref 5–15)
BUN: 45 mg/dL — AB (ref 6–20)
CHLORIDE: 97 mmol/L — AB (ref 101–111)
CO2: 24 mmol/L (ref 22–32)
CREATININE: 1.69 mg/dL — AB (ref 0.61–1.24)
Calcium: 10.1 mg/dL (ref 8.9–10.3)
GFR calc Af Amer: 41 mL/min — ABNORMAL LOW (ref >60)
GFR, EST NON AFRICAN AMERICAN: 35 mL/min — AB (ref >60)
GLUCOSE: 136 mg/dL — AB (ref 65–99)
POTASSIUM: 4.3 mmol/L (ref 3.5–5.1)
Sodium: 135 mmol/L (ref 135–145)
Total Bilirubin: 0.8 mg/dL (ref 0.3–1.2)
Total Protein: 7.8 g/dL (ref 6.5–8.1)

## 2016-03-19 LAB — CBC WITH DIFFERENTIAL/PLATELET
BASOS ABS: 0 10*3/uL (ref 0.0–0.1)
BASOS PCT: 0 %
EOS ABS: 0.3 10*3/uL (ref 0.0–0.7)
Eosinophils Relative: 3 %
HCT: 45.3 % (ref 39.0–52.0)
HEMOGLOBIN: 15.2 g/dL (ref 13.0–17.0)
Lymphocytes Relative: 29 %
Lymphs Abs: 2.4 10*3/uL (ref 0.7–4.0)
MCH: 28.6 pg (ref 26.0–34.0)
MCHC: 33.6 g/dL (ref 30.0–36.0)
MCV: 85.2 fL (ref 78.0–100.0)
MONOS PCT: 11 %
Monocytes Absolute: 0.9 10*3/uL (ref 0.1–1.0)
NEUTROS PCT: 57 %
Neutro Abs: 4.8 10*3/uL (ref 1.7–7.7)
Platelets: 245 10*3/uL (ref 150–400)
RBC: 5.32 MIL/uL (ref 4.22–5.81)
RDW: 15.3 % (ref 11.5–15.5)
WBC: 8.4 10*3/uL (ref 4.0–10.5)

## 2016-03-19 LAB — URINALYSIS, ROUTINE W REFLEX MICROSCOPIC
BILIRUBIN URINE: NEGATIVE
Glucose, UA: NEGATIVE mg/dL
HGB URINE DIPSTICK: NEGATIVE
Ketones, ur: NEGATIVE mg/dL
Leukocytes, UA: NEGATIVE
Nitrite: NEGATIVE
PROTEIN: NEGATIVE mg/dL
Specific Gravity, Urine: 1.013 (ref 1.005–1.030)
pH: 5 (ref 5.0–8.0)

## 2016-03-19 LAB — TROPONIN I

## 2016-03-19 MED ORDER — SODIUM CHLORIDE 0.9 % IV BOLUS (SEPSIS)
1000.0000 mL | Freq: Once | INTRAVENOUS | Status: AC
  Administered 2016-03-19: 1000 mL via INTRAVENOUS

## 2016-03-19 MED ORDER — MOMETASONE FURO-FORMOTEROL FUM 200-5 MCG/ACT IN AERO: 2.0000 | INHALATION_SPRAY | Freq: Two times a day (BID) | RESPIRATORY_TRACT | Status: DC

## 2016-03-19 MED ORDER — METFORMIN HCL 500 MG PO TABS
500.0000 mg | ORAL_TABLET | Freq: Two times a day (BID) | ORAL | Status: DC
  Administered 2016-03-19: 500 mg via ORAL
  Filled 2016-03-19: qty 1

## 2016-03-19 MED ORDER — POTASSIUM CHLORIDE CRYS ER 20 MEQ PO TBCR
20.0000 meq | EXTENDED_RELEASE_TABLET | Freq: Every day | ORAL | Status: DC
  Administered 2016-03-19: 20 meq via ORAL
  Filled 2016-03-19: qty 1

## 2016-03-19 MED ORDER — LOSARTAN POTASSIUM 50 MG PO TABS
100.0000 mg | ORAL_TABLET | Freq: Every day | ORAL | Status: DC
  Administered 2016-03-19: 100 mg via ORAL
  Filled 2016-03-19: qty 2

## 2016-03-19 MED ORDER — AMLODIPINE BESYLATE 5 MG PO TABS
10.0000 mg | ORAL_TABLET | Freq: Every day | ORAL | Status: DC
  Administered 2016-03-19: 10 mg via ORAL
  Filled 2016-03-19: qty 2

## 2016-03-19 MED ORDER — ACETAMINOPHEN 325 MG PO TABS: 650.0000 mg | ORAL_TABLET | Freq: Four times a day (QID) | ORAL | Status: DC | PRN

## 2016-03-19 MED ORDER — PANTOPRAZOLE SODIUM 40 MG PO TBEC
40.0000 mg | DELAYED_RELEASE_TABLET | Freq: Every day | ORAL | Status: DC
  Administered 2016-03-19: 40 mg via ORAL
  Filled 2016-03-19: qty 1

## 2016-03-19 MED ORDER — CLONIDINE HCL 0.1 MG PO TABS: 0.1000 mg | ORAL_TABLET | Freq: Every day | ORAL | Status: DC | PRN

## 2016-03-19 MED ORDER — OCUVITE-LUTEIN PO CAPS
1.0000 | ORAL_CAPSULE | Freq: Two times a day (BID) | ORAL | Status: DC
  Filled 2016-03-19: qty 1

## 2016-03-19 MED ORDER — LORAZEPAM 0.5 MG PO TABS: 0.5000 mg | ORAL_TABLET | Freq: Every day | ORAL | Status: DC

## 2016-03-19 MED ORDER — FUROSEMIDE 40 MG PO TABS
40.0000 mg | ORAL_TABLET | Freq: Every day | ORAL | Status: DC
  Administered 2016-03-19: 40 mg via ORAL
  Filled 2016-03-19: qty 1

## 2016-03-19 MED ORDER — ALBUTEROL SULFATE (2.5 MG/3ML) 0.083% IN NEBU: 2.5000 mg | INHALATION_SOLUTION | RESPIRATORY_TRACT | Status: DC | PRN

## 2016-03-19 MED ORDER — ALBUTEROL SULFATE HFA 108 (90 BASE) MCG/ACT IN AERS: 2.0000 | INHALATION_SPRAY | RESPIRATORY_TRACT | Status: DC | PRN

## 2016-03-19 MED ORDER — CLOPIDOGREL BISULFATE 75 MG PO TABS
75.0000 mg | ORAL_TABLET | Freq: Every day | ORAL | Status: DC
  Administered 2016-03-19: 75 mg via ORAL
  Filled 2016-03-19: qty 1

## 2016-03-19 MED ORDER — BRIMONIDINE TARTRATE 0.15 % OP SOLN: 1.0000 [drp] | Freq: Two times a day (BID) | OPHTHALMIC | Status: DC

## 2016-03-19 MED ORDER — SIMVASTATIN 20 MG PO TABS
20.0000 mg | ORAL_TABLET | Freq: Every day | ORAL | Status: DC
Start: 2016-03-19 — End: 2016-03-19
  Administered 2016-03-19: 20 mg via ORAL
  Filled 2016-03-19: qty 1

## 2016-03-19 MED ORDER — DONEPEZIL HCL 5 MG PO TABS: 10.0000 mg | ORAL_TABLET | Freq: Every day | ORAL | Status: DC

## 2016-03-19 MED ORDER — PAROXETINE HCL 20 MG PO TABS
20.0000 mg | ORAL_TABLET | Freq: Every day | ORAL | Status: DC
  Administered 2016-03-19: 20 mg via ORAL
  Filled 2016-03-19: qty 1

## 2016-03-19 NOTE — ED Provider Notes (Signed)
[facility] DEPT Provider Note   CSN: [id number] Arrival date & time: 03/19/16  1059     History   Chief Complaint Chief Complaint  Patient presents with  . Generalized weakness    HPI Chad [name] is a 83 y.o. male.  Pt presents to the ED with weakness.  The pt's wife called EMS this morning because of the weakness.  She told EMS that his urine had a strong odor.  Pt has no complaints.  Pt's wife said that he has not been wanting to get out of bed.  [facility]  PT had to be done in bed yesterday because he was not able to get up.  The wife said she is unable to get him OOB by herself.       Past Medical History:  Diagnosis Date  . Acid reflux disease   . Arthritis    "all over"  . Arthritis   . Barrett's esophagus   . Basal cell carcinoma of nose   . CAROTID ARTERY STENOSIS, BILATERAL 08/27/2008  . Chronic bronchitis ([place])    "most q yr; he's had it several times" (12/20/2013)  . COPD (chronic obstructive pulmonary disease) ([place])    "severe" (12/20/2013)  . COPD (chronic obstructive pulmonary disease) ([place])   . Coronary artery stenosis   . Diabetes mellitus without complication ([place])   . Diaphragmatic hernia without mention of obstruction or gangrene   . Diverticulitis   . Diverticulosis of colon   . Dyspnea   . Esophageal stenosis   . Fatty liver disease, nonalcoholic   . GERD (gastroesophageal reflux disease)   . Glaucoma   . Glaucoma   . High cholesterol   . History of blood transfusion 2007   "related to OR"  . History of hiatal hernia    "repaired when esophagus removed"  . History of stomach ulcers   . HTN (hypertension)   . Hyperlipidemia   . Hypertension   . Kidney stones    "passed them all" (12/20/2013)  . Memory loss    DEMENTIA  . Personal history of other malignant neoplasm of skin   . Pneumonia 1990's X 4  . RENAL CALCULUS, HX OF 08/28/2008  . Renal disorder   . Sinus headache    "seasonal"  . Stroke ([place])    . TIA (transient ischemic attack) 1998 X "a few"  . Type II diabetes mellitus [facility])     Patient Active Problem List   Diagnosis Date Noted  . Anemia 02/27/2016  . CAP (community acquired pneumonia) 11/27/2015  . Diabetes mellitus without complication ([place])   . Stroke ([place])   . Hypertension   . Hyperlipidemia   . Weakness   . Pseudobulbar affect 07/02/2015  . Controlled diabetes mellitus type 2 with complications ([place]) [id number]  . GERD without esophagitis 06/12/2015  . Cerebral thrombosis with cerebral infarction 06/05/2015  . HCAP (healthcare-associated pneumonia) 06/02/2015  . Urinary incontinence 04/13/2015  . Moderate dementia with behavioral disturbance 03/09/2015  . Depression 03/09/2015  . Legal blindness [place] 08/07/2014  . Routine general medical examination at a health care facility 04/24/2014  . Carotid stenosis 02/24/2014  . Barrett's esophagus 10/11/2011  . FATTY LIVER DISEASE 08/27/2008  . Glaucoma 05/20/2008  . Essential hypertension 05/20/2008  . COPD GOLD II  05/20/2008  . ARTHRITIS 05/20/2008    Past Surgical History:  Procedure Laterality Date  . BASAL CELL CARCINOMA EXCISION    . CAROTID ARTERY ANGIOPLASTY    .  CAROTID ENDARTERECTOMY Bilateral 02/1996-03/1996  . CATARACT EXTRACTION W/ INTRAOCULAR LENS IMPLANT Bilateral 04/2007 - 09/2007   "left-right"  . ESOPHAGECTOMY  2007   with stomach pull through  . ESOPHAGOGASTRODUODENOSCOPY (EGD) WITH ESOPHAGEAL DILATION  10/2011  . EYE SURGERY Right 04/2006; 08/2007   "had gas bubbles put in"  . EYE SURGERY Right 01/2007   "air bubble"  . EYE SURGERY Left 12/12/2013   "cleaned his implant w/laser"  . EYE SURGERY    . GLAUCOMA SURGERY Right 11/2008   "implanted drain tube"  . HERNIA REPAIR    . KNEE ARTHROSCOPY Bilateral 04/1989; 08/1996; 06/1999   "right; left; left"  . KNEE SURGERY    . MOHS SURGERY  ?2002   "tip of nose; basal cell"  . NASAL POLYP EXCISION  2005   "benign"  . PARS PLANA VITRECTOMY  Bilateral 01/2007-05/2007  . SHOULDER ARTHROSCOPY Right 04/1998  . SHOULDER SURGERY    . VENTRAL HERNIA REPAIR  02/2006       Home Medications    Prior to Admission medications   Medication Sig Start Date End Date Taking? Authorizing Provider  ACETAMINOPHEN PO Take 650 mg by mouth every 6 (six) hours as needed for fever.    Yes Historical Provider, MD  albuterol (PROVENTIL HFA;VENTOLIN HFA) 108 (90 Base) MCG/ACT inhaler Inhale 2 puffs into the lungs every 4 (four) hours as needed for wheezing or shortness of breath.   Yes Historical Provider, MD  albuterol (PROVENTIL) (2.5 MG/3ML) 0.083% nebulizer solution Take 2.5 mg by nebulization every 4 (four) hours as needed for wheezing or shortness of breath.   Yes Historical Provider, MD  amLODipine (NORVASC) 10 MG tablet Take 1 tablet (10 mg total) by mouth daily. 08/27/15  Yes Hoyt Koch, MD  brimonidine (ALPHAGAN) 0.15 % ophthalmic solution Place 1 drop into the left eye 2 (two) times daily. 06/09/15  Yes Bonnell Public, MD  budesonide-formoterol [facility]) 160-4.5 MCG/ACT inhaler Inhale 2 puffs into the lungs 2 (two) times daily.   Yes Historical Provider, MD  Cholecalciferol (VITAMIN D) 1000 UNITS capsule Take 1,000 Units by mouth daily.     Yes Historical Provider, MD  cloNIDine (CATAPRES) 0.2 MG tablet Take 0.5 tablets (0.1 mg total) by mouth daily as needed (for SBP >140). Only take 1/2 tablet if systolic is over [id number]. 0000000  Yes Hoyt Koch, MD  clopidogrel (PLAVIX) 75 MG tablet TAKE 1 TABLET BY MOUTH EVERY DAY 08/20/15  Yes Rosalin Hawking, MD  donepezil (ARICEPT) 10 MG tablet Take 1 tablet (10 mg total) by mouth at bedtime. 02/26/16  Yes Cameron Sprang, MD  furosemide (LASIX) 40 MG tablet Take 40 mg by mouth daily.   Yes Historical Provider, MD  LORazepam (ATIVAN) 0.5 MG tablet Take 1 tablet (0.5 mg total) by mouth at bedtime. 02/01/16  Yes Hoyt Koch, MD  losartan (COZAAR) 100 MG tablet Take 1 tablet (100 mg total) by  mouth daily. 01/28/16  Yes Hoyt Koch, MD  metFORMIN (GLUCOPHAGE) 500 MG tablet Take 1 tablet (500 mg total) by mouth 2 (two) times daily with a meal. 02/01/16  Yes Hoyt Koch, MD  Multiple Vitamins-Minerals (PRESERVISION AREDS 2) CAPS Take 1 capsule by mouth 2 (two) times daily.   Yes Historical Provider, MD  omeprazole (PRILOSEC) 20 MG capsule Take 20 mg by mouth 2 (two) times daily before a meal.    Yes Historical Provider, MD  PARoxetine (PAXIL) 20 MG tablet Take 1 tablet (20 mg total)  by mouth daily. 03/07/16  Yes Hoyt Koch, MD  potassium chloride SA (K-DUR,KLOR-CON) 20 MEQ tablet Take 1 tablet (20 mEq total) by mouth daily. 01/27/16  Yes Hoyt Koch, MD  promethazine (PHENERGAN) 25 MG tablet Take 12.5 mg by mouth every 8 (eight) hours as needed for nausea or vomiting.    Yes Historical Provider, MD  simvastatin (ZOCOR) 20 MG tablet TAKE 1 TABLET BY MOUTH DAILY AT 6 PM 01/26/16  Yes Hoyt Koch, MD  vitamin B-12 (CYANOCOBALAMIN) 1000 MCG tablet Take 1,000 mcg by mouth daily.   Yes Historical Provider, MD  doxycycline (VIBRA-TABS) 100 MG tablet Take 1 tablet (100 mg total) by mouth 2 (two) times daily. Patient not taking: Reported on 03/19/2016 03/07/16   Hoyt Koch, MD    Family History Family History  Problem Relation Age of Onset  . Colon cancer Mother   . Lung cancer Mother   . Heart attack Mother   . Stomach cancer Father   . Esophageal cancer Father   . Colon cancer Maternal Grandfather   . Heart attack Brother   . Heart attack Brother     Social History Social History  Substance Use Topics  . Smoking status: Former Smoker    Packs/day: 1.00    Years: 40.00    Types: Cigarettes    Quit date: 01/24/1978  . Smokeless tobacco: Never Used  . Alcohol use No     Allergies   Moxifloxacin; Quinolones; Sulfonamide derivatives; Timolol maleate; Amoxicillin; Augmentin [amoxicillin-pot clavulanate]; and Sulfa antibiotics   Review  of Systems Review of Systems  All other systems reviewed and are negative.    Physical Exam Updated Vital Signs BP 156/71   Pulse 80   Temp 97.8 F (36.6 C) (Oral)   Resp 15   Wt 180 lb (81.6 kg)   SpO2 95%   BMI 26.58 kg/m   Physical Exam  Constitutional: He appears well-developed and well-nourished.  HENT:  Head: Normocephalic and atraumatic.  Right Ear: External ear normal.  Left Ear: External ear normal.  Nose: Nose normal.  Mouth/Throat: Oropharynx is clear and moist.  Eyes: Conjunctivae and EOM are normal. Pupils are equal, round, and reactive to light.  Neck: Normal range of motion. Neck supple.  Cardiovascular: Normal rate, regular rhythm, normal heart sounds and intact distal pulses.   Pulmonary/Chest: Effort normal and breath sounds normal.  Abdominal: Soft. Bowel sounds are normal.  Musculoskeletal: Normal range of motion.  Neurological: He is alert.  Skin: Skin is warm.  Psychiatric: He has a normal mood and affect. His behavior is normal. Judgment and thought content normal.  Nursing note and vitals reviewed.    ED Treatments / Results  Labs (all labs ordered are listed, but only abnormal results are displayed) Labs Reviewed  COMPREHENSIVE METABOLIC PANEL - Abnormal; Notable for the following:       Result Value   Chloride 97 (*)    Glucose, Bld 136 (*)    BUN 45 (*)    Creatinine, Ser 1.69 (*)    GFR calc non Af Amer 35 (*)    GFR calc Af Amer 41 (*)    All other components within normal limits  CBC WITH DIFFERENTIAL/PLATELET  TROPONIN I  URINALYSIS, ROUTINE W REFLEX MICROSCOPIC    EKG  EKG Interpretation  Date/Time:  Saturday March 19 2016 11:31:48 EST Ventricular Rate:  77 PR Interval:    QRS Duration: 88 QT Interval:  364 QTC Calculation: 412 R Axis:  97 Text Interpretation:  Sinus rhythm Consider left atrial enlargement Right axis deviation Confirmed by [facility] MD, Caela Huot ([id number]) on 03/19/2016 11:52:55 AM       Radiology Dg  Chest 2 View  Result Date: 03/19/2016 CLINICAL DATA:  Weakness this morning. EXAM: CHEST  2 VIEW COMPARISON:  PA and lateral chest 02/28/2016. FINDINGS: The lungs are clear. Heart size is normal. Hiatal hernia is again seen. Aortic atherosclerosis is noted. No acute bony abnormality. IMPRESSION: No acute disease. Atherosclerosis. Hiatal hernia. Electronically Signed   By: Inge Rise M.D.   On: 03/19/2016 12:27    Procedures Procedures (including critical care time)  Medications Ordered in ED Medications  sodium chloride 0.9 % bolus 1,000 mL (1,000 mLs Intravenous New Bag/Given 03/19/16 1139)     Initial Impression / Assessment and Plan / ED Course  I have reviewed the triage vital signs and the nursing notes.  Pertinent labs & imaging results that were available during my care of the patient were reviewed by me and considered in my medical decision making (see chart for details).    Pt's wife requests to speak to SW regarding placement.  I will consult SW and PT.  Final Clinical Impressions(s) / ED Diagnoses   Final diagnoses:  Ambulatory dysfunction    New Prescriptions New Prescriptions   No medications on file     Isla Pence, MD 03/20/16 [phone]

## 2016-03-19 NOTE — ED Notes (Signed)
Patient discharged. Wife patsy signed. She will transfer to [facility]

## 2016-03-19 NOTE — ED Triage Notes (Signed)
Per [facility], pt with hx of dementia, wife states he was weaker this morning when she was taking him to the bathroom and his urine was foul smelling. Pt with no complaints.

## 2016-03-19 NOTE — NC FL2 (Signed)
[place] LEVEL OF CARE SCREENING TOOL     IDENTIFICATION  Patient Name: Chad [name] Birthdate: 04-15-1929 Sex: male Admission Date (Current Location): 03/19/2016  [facility] and [place] Number:  Herbalist and Address:  [facility],  [place], [place]      Provider Number: [id number]  Attending Physician Name and Address:  Isla Pence, MD  Relative Name and Phone Number:  kymoni [phone] [phone]    Current Level of Care: Hospital Recommended Level of Care: [place] Prior Approval Number:    Date Approved/Denied:   PASRR Number: [id number]  Discharge Plan: SNF    Current Diagnoses: Patient Active Problem List   Diagnosis Date Noted  . Anemia 02/27/2016  . CAP (community acquired pneumonia) 11/27/2015  . Diabetes mellitus without complication ([place])   . Stroke ([place])   . Hypertension   . Hyperlipidemia   . Weakness   . Pseudobulbar affect 07/02/2015  . Controlled diabetes mellitus type 2 with complications ([place]) XX123456  . GERD without esophagitis 06/12/2015  . Cerebral thrombosis with cerebral infarction 06/05/2015  . HCAP (healthcare-associated pneumonia) 06/02/2015  . Urinary incontinence 04/13/2015  . Moderate dementia with behavioral disturbance 03/09/2015  . Depression 03/09/2015  . Legal blindness [place] 08/07/2014  . Routine general medical examination at a health care facility 04/24/2014  . Carotid stenosis 02/24/2014  . Barrett's esophagus 10/11/2011  . FATTY LIVER DISEASE 08/27/2008  . Glaucoma 05/20/2008  . Essential hypertension 05/20/2008  . COPD GOLD II  05/20/2008  . ARTHRITIS 05/20/2008    Orientation RESPIRATION BLADDER Height & Weight     Self, Place  Normal Incontinent Weight: 180 lb (81.6 kg) Height:     BEHAVIORAL SYMPTOMS/MOOD NEUROLOGICAL BOWEL NUTRITION STATUS      Incontinent Diet (low sodium)  AMBULATORY STATUS COMMUNICATION OF NEEDS Skin   Extensive  Assist Verbally (verbal with difficulty) Normal                       Personal Care Assistance Level of Assistance  Bathing, Feeding, Dressing Bathing Assistance: Maximum assistance Feeding assistance: Maximum assistance Dressing Assistance: Maximum assistance     Functional Limitations Info  Hearing, Speech, Sight Sight Info: Impaired Hearing Info: Impaired Speech Info: Impaired (Verbal with difficulty)    SPECIAL CARE FACTORS FREQUENCY  PT (By licensed PT), OT (By licensed OT), Blood pressure Blood Pressure Frequency: Per wife report: every few days   PT Frequency: 5x OT Frequency: 5x            Contractures Contractures Info: Not present    Additional Factors Info  Code Status, Allergies Code Status Info: Full code  Allergies Info: Moxifloxacin, Quinolones, Sulfonamide Derivatives, Timolol Maleate, Amoxicillin, Augmentin, Sulfa Antibiotics            Current Medications (03/19/2016):  This is the current hospital active medication list Current Facility-Administered Medications  Medication Dose Route Frequency Provider Last Rate Last Dose  . acetaminophen (TYLENOL) tablet 650 mg  650 mg Oral Q6H PRN Isla Pence, MD      . albuterol (PROVENTIL HFA;VENTOLIN HFA) 108 (90 Base) MCG/ACT inhaler 2 puff  2 puff Inhalation Q4H PRN Isla Pence, MD      . albuterol (PROVENTIL) (2.5 MG/3ML) 0.083% nebulizer solution 2.5 mg  2.5 mg Nebulization Q4H PRN Isla Pence, MD      . amLODipine (NORVASC) tablet 10 mg  10 mg Oral Daily Isla Pence, MD      .  brimonidine (ALPHAGAN) 0.15 % ophthalmic solution 1 drop  1 drop Left Eye BID Isla Pence, MD      . cloNIDine (CATAPRES) tablet 0.1 mg  0.1 mg Oral Daily PRN Isla Pence, MD      . clopidogrel (PLAVIX) tablet 75 mg  75 mg Oral Daily Isla Pence, MD      . donepezil (ARICEPT) tablet 10 mg  10 mg Oral QHS Isla Pence, MD      . furosemide (LASIX) tablet 40 mg  40 mg Oral Daily Isla Pence, MD      .  LORazepam (ATIVAN) tablet 0.5 mg  0.5 mg Oral QHS Isla Pence, MD      . losartan (COZAAR) tablet 100 mg  100 mg Oral Daily Isla Pence, MD      . metFORMIN (GLUCOPHAGE) tablet 500 mg  500 mg Oral BID WC Isla Pence, MD      . mometasone-formoterol [facility]) 200-5 MCG/ACT inhaler 2 puff  2 puff Inhalation BID Isla Pence, MD      . pantoprazole (PROTONIX) EC tablet 40 mg  40 mg Oral Daily Isla Pence, MD      . PARoxetine (PAXIL) tablet 20 mg  20 mg Oral Daily Isla Pence, MD      . potassium chloride SA (K-DUR,KLOR-CON) CR tablet 20 mEq  20 mEq Oral Daily Isla Pence, MD      . PRESERVISION AREDS 2 CAPS 1 capsule  1 capsule Oral BID Isla Pence, MD      . simvastatin (ZOCOR) tablet 20 mg  20 mg Oral q1800 Isla Pence, MD       Current Outpatient Prescriptions  Medication Sig Dispense Refill  . ACETAMINOPHEN PO Take 650 mg by mouth every 6 (six) hours as needed for fever.     Marland Kitchen albuterol (PROVENTIL HFA;VENTOLIN HFA) 108 (90 Base) MCG/ACT inhaler Inhale 2 puffs into the lungs every 4 (four) hours as needed for wheezing or shortness of breath.    Marland Kitchen albuterol (PROVENTIL) (2.5 MG/3ML) 0.083% nebulizer solution Take 2.5 mg by nebulization every 4 (four) hours as needed for wheezing or shortness of breath.    Marland Kitchen amLODipine (NORVASC) 10 MG tablet Take 1 tablet (10 mg total) by mouth daily. 30 tablet 6  . brimonidine (ALPHAGAN) 0.15 % ophthalmic solution Place 1 drop into the left eye 2 (two) times daily. 5 mL 12  . budesonide-formoterol (SYMBICORT) 160-4.5 MCG/ACT inhaler Inhale 2 puffs into the lungs 2 (two) times daily.    . Cholecalciferol (VITAMIN D) 1000 UNITS capsule Take 1,000 Units by mouth daily.      . cloNIDine (CATAPRES) 0.2 MG tablet Take 0.5 tablets (0.1 mg total) by mouth daily as needed (for SBP >140). Only take 1/2 tablet if systolic is over [id number]. 30 tablet 0  . clopidogrel (PLAVIX) 75 MG tablet TAKE 1 TABLET BY MOUTH EVERY DAY 90 tablet 3  . donepezil (ARICEPT)  10 MG tablet Take 1 tablet (10 mg total) by mouth at bedtime. 90 tablet 3  . furosemide (LASIX) 40 MG tablet Take 40 mg by mouth daily.    Marland Kitchen LORazepam (ATIVAN) 0.5 MG tablet Take 1 tablet (0.5 mg total) by mouth at bedtime. 30 tablet 3  . losartan (COZAAR) 100 MG tablet Take 1 tablet (100 mg total) by mouth daily. 90 tablet 1  . metFORMIN (GLUCOPHAGE) 500 MG tablet Take 1 tablet (500 mg total) by mouth 2 (two) times daily with a meal. 180 tablet 3  . Multiple Vitamins-Minerals (PRESERVISION  AREDS 2) CAPS Take 1 capsule by mouth 2 (two) times daily.    Marland Kitchen omeprazole (PRILOSEC) 20 MG capsule Take 20 mg by mouth 2 (two) times daily before a meal.     . PARoxetine (PAXIL) 20 MG tablet Take 1 tablet (20 mg total) by mouth daily. 30 tablet 6  . potassium chloride SA (K-DUR,KLOR-CON) 20 MEQ tablet Take 1 tablet (20 mEq total) by mouth daily. 90 tablet 1  . promethazine (PHENERGAN) 25 MG tablet Take 12.5 mg by mouth every 8 (eight) hours as needed for nausea or vomiting.     . simvastatin (ZOCOR) 20 MG tablet TAKE 1 TABLET BY MOUTH DAILY AT 6 PM 90 tablet 0  . vitamin B-12 (CYANOCOBALAMIN) 1000 MCG tablet Take 1,000 mcg by mouth daily.    Marland Kitchen doxycycline (VIBRA-TABS) 100 MG tablet Take 1 tablet (100 mg total) by mouth 2 (two) times daily. (Patient not taking: Reported on 03/19/2016) 14 tablet 0     Discharge Medications: Please see current medication list.  Relevant Imaging Results:  Relevant Lab Results:   Additional Information SSN [id number]  Burnis Medin, LCSW

## 2016-03-19 NOTE — Evaluation (Signed)
Physical Therapy Evaluation Patient Details Name: Chad [name] MRN: [id number] DOB: Dec 26, 1929 Today's Date: 03/19/2016   History of Present Illness  Chad [name] is a 83 y.o. male with medical history significant of acid reflux disease, Barrett's esophagus, esophageal stenosis, osteoarthritis, basal cell CA of the nose, bilateral carotid artery stenosis, COPD, CAD, type 2 diabetes, diverticulosis, nonalcoholic fatty liver disease, glaucoma, hyperlipidemia, hypertension, urolithiasis, TIA, CVA, dementia, multiple pneumonia episodes (most recent 3 months ago) who is being brought to the emergency department for evaluation of generalized weakness, sore throat, mild cough of productive sputum  Clinical Impression  Pt admitted as above and presenting with functional mobility limitations 2* generalized weakness, balance deficits and cognitive deficits.  Pt would benefit from follow up rehab at SNF level to maximize IND and safety prior to return home with assist of wife only.    Follow Up Recommendations SNF    Equipment Recommendations  None recommended by PT    Recommendations for Other Services       Precautions / Restrictions Precautions Precautions: Fall Restrictions Weight Bearing Restrictions: No      Mobility  Bed Mobility Overal bed mobility: Needs Assistance Bed Mobility: Supine to Sit;Sit to Supine     Supine to sit: Min assist;Mod assist Sit to supine: Mod assist   General bed mobility comments: Increased time for processing and follow through.  Significant assist to bring LEs up onto bed  Transfers Overall transfer level: Needs assistance Equipment used: Rolling walker (2 wheeled) Transfers: Sit to/from Stand Sit to Stand: Min assist;From elevated surface         General transfer comment: cues to rise, hand placement  Ambulation/Gait Ambulation/Gait assistance: Min assist;+2 safety/equipment Ambulation Distance (Feet): 65 Feet Assistive device:  Rolling walker (2 wheeled) Gait Pattern/deviations: Step-through pattern;Decreased step length - right;Decreased step length - left;Shuffle;Trunk flexed;Wide base of support Gait velocity: decr Gait velocity interpretation: Below normal speed for age/gender General Gait Details: cues for posture and position from RW.  Physical assist to manage RW and maintain balance  Stairs            Wheelchair Mobility    Modified Rankin (Stroke Patients Only)       Balance Overall balance assessment: Needs assistance Sitting-balance support: Feet supported;No upper extremity supported Sitting balance-Leahy Scale: Fair     Standing balance support: No upper extremity supported Standing balance-Leahy Scale: Poor                               Pertinent Vitals/Pain Pain Assessment: No/denies pain    Home Living Family/patient expects to be discharged to:: Private residence Living Arrangements: Spouse/significant other Available Help at Discharge: Family;Available 24 hours/day Type of Home: House Home Access: Stairs to enter Entrance Stairs-Rails: Psychiatric nurse of Steps: 1 Home Layout: Multi-level Home Equipment: Walker - 2 wheels;Cane - single point;Toilet riser      Prior Function Level of Independence: Needs assistance   Gait / Transfers Assistance Needed: Pt ambulating with RW and increassing level of assist for short distances  ADL's / Homemaking Assistance Needed: Wife helped with dressing due to confusion/difficulty problem solving  Comments: Hx of falls and near falls at home     Hand Dominance   Dominant Hand: Right    Extremity/Trunk Assessment   Upper Extremity Assessment Upper Extremity Assessment: Generalized weakness;Difficult to assess due to impaired cognition    Lower Extremity Assessment Lower Extremity Assessment: Generalized weakness;Difficult  to assess due to impaired cognition       Communication    Communication: HOH  Cognition Arousal/Alertness: Awake/alert Behavior During Therapy: Flat affect Overall Cognitive Status: History of cognitive impairments - at baseline                 General Comments: constantly stating"I',m sorry", oriented to [facility] hospital    General Comments      Exercises     Assessment/Plan    PT Assessment Patient needs continued PT services  PT Problem List Decreased strength;Decreased activity tolerance;Decreased balance;Decreased mobility;Decreased knowledge of use of DME       PT Treatment Interventions DME instruction;Gait training;Functional mobility training;Therapeutic activities;Balance training;Patient/family education    PT Goals (Current goals can be found in the Care Plan section)  Acute Rehab PT Goals Patient Stated Goal: Get stronger to allow return home safely PT Goal Formulation: With patient/family Time For Goal Achievement: 04/02/16 Potential to Achieve Goals: Fair    Frequency Min 3X/week   Barriers to discharge        Co-evaluation               End of Session Equipment Utilized During Treatment: Gait belt Activity Tolerance: Patient tolerated treatment well Patient left: in bed;with call bell/phone within reach;with family/visitor present Nurse Communication: Mobility status PT Visit Diagnosis: Unsteadiness on feet (R26.81);History of falling (Z91.81);Other abnormalities of gait and mobility (R26.89)    Functional Assessment Tool Used: Clinical judgement Functional Limitation: Mobility: Walking and moving around Mobility: Walking and Moving Around Current Status [id number]): At least 40 percent but less than 60 percent impaired, limited or restricted Mobility: Walking and Moving Around Goal Status [phone]): At least 20 percent but less than 40 percent impaired, limited or restricted    Time: 1510-1540 PT Time Calculation (min) (ACUTE ONLY): 30 min   Charges:   PT Evaluation $PT Eval Moderate Complexity: 1  Procedure PT Treatments $Gait Training: 8-22 mins   PT G Codes:   PT G-Codes **NOT FOR INPATIENT CLASS** Functional Assessment Tool Used: Clinical judgement Functional Limitation: Mobility: Walking and moving around Mobility: Walking and Moving Around Current Status [id number]): At least 40 percent but less than 60 percent impaired, limited or restricted Mobility: Walking and Moving Around Goal Status [phone]): At least 20 percent but less than 40 percent impaired, limited or restricted     Chad [name] 03/19/2016, 4:10 PM

## 2016-03-19 NOTE — NC FL2 (Signed)
[place] LEVEL OF CARE SCREENING TOOL     IDENTIFICATION  Patient Name: Chad [name] Birthdate: Oct 11, 1929 Sex: male Admission Date (Current Location): 03/19/2016  [facility] and [place] Number:  Herbalist and Address:  [facility],  [place], [place]      Provider Number: [id number]  Attending Physician Name and Address:  Isla Pence, MD  Relative Name and Phone Number:  jas reinholtz [phone]    Current Level of Care: Hospital Recommended Level of Care: [place] Prior Approval Number:    Date Approved/Denied:   PASRR Number: [id number]  Discharge Plan: SNF    Current Diagnoses: Patient Active Problem List   Diagnosis Date Noted  . Anemia 02/27/2016  . CAP (community acquired pneumonia) 11/27/2015  . Diabetes mellitus without complication ([place])   . Stroke ([place])   . Hypertension   . Hyperlipidemia   . Weakness   . Pseudobulbar affect 07/02/2015  . Controlled diabetes mellitus type 2 with complications ([place]) [id number]  . GERD without esophagitis 06/12/2015  . Cerebral thrombosis with cerebral infarction 06/05/2015  . HCAP (healthcare-associated pneumonia) 06/02/2015  . Urinary incontinence 04/13/2015  . Moderate dementia with behavioral disturbance 03/09/2015  . Depression 03/09/2015  . Legal blindness [place] 08/07/2014  . Routine general medical examination at a health care facility 04/24/2014  . Carotid stenosis 02/24/2014  . Barrett's esophagus 10/11/2011  . FATTY LIVER DISEASE 08/27/2008  . Glaucoma 05/20/2008  . Essential hypertension 05/20/2008  . COPD GOLD II  05/20/2008  . ARTHRITIS 05/20/2008    Orientation RESPIRATION BLADDER Height & Weight     Self, Place  Normal Incontinent Weight: 180 lb (81.6 kg) Height:     BEHAVIORAL SYMPTOMS/MOOD NEUROLOGICAL BOWEL NUTRITION STATUS      Incontinent Diet (low sodium)  AMBULATORY STATUS COMMUNICATION OF NEEDS Skin   Extensive  Assist Verbally (verbal with difficulty) Normal                       Personal Care Assistance Level of Assistance  Bathing, Feeding, Dressing Bathing Assistance: Maximum assistance Feeding assistance: Maximum assistance Dressing Assistance: Maximum assistance     Functional Limitations Info  Hearing, Speech, Sight Sight Info: Impaired Hearing Info: Impaired Speech Info: Impaired (Verbal with difficulty)    SPECIAL CARE FACTORS FREQUENCY  PT (By licensed PT), OT (By licensed OT), Blood pressure Blood Pressure Frequency: Per wife report: every few days   PT Frequency: 5x OT Frequency: 5x            Contractures Contractures Info: Not present    Additional Factors Info  Code Status, Allergies Code Status Info: Full code  Allergies Info: Moxifloxacin, Quinolones, Sulfonamide Derivatives, Timolol Maleate, Amoxicillin, Augmentin, Sulfa Antibiotics            Current Medications (03/19/2016):  This is the current hospital active medication list Current Facility-Administered Medications  Medication Dose Route Frequency Provider Last Rate Last Dose  . acetaminophen (TYLENOL) tablet 650 mg  650 mg Oral Q6H PRN Isla Pence, MD      . albuterol (PROVENTIL HFA;VENTOLIN HFA) 108 (90 Base) MCG/ACT inhaler 2 puff  2 puff Inhalation Q4H PRN Isla Pence, MD      . albuterol (PROVENTIL) (2.5 MG/3ML) 0.083% nebulizer solution 2.5 mg  2.5 mg Nebulization Q4H PRN Isla Pence, MD      . amLODipine (NORVASC) tablet 10 mg  10 mg Oral Daily Isla Pence, MD      .  brimonidine (ALPHAGAN) 0.15 % ophthalmic solution 1 drop  1 drop Left Eye BID Isla Pence, MD      . cloNIDine (CATAPRES) tablet 0.1 mg  0.1 mg Oral Daily PRN Isla Pence, MD      . clopidogrel (PLAVIX) tablet 75 mg  75 mg Oral Daily Isla Pence, MD      . donepezil (ARICEPT) tablet 10 mg  10 mg Oral QHS Isla Pence, MD      . furosemide (LASIX) tablet 40 mg  40 mg Oral Daily Isla Pence, MD      .  LORazepam (ATIVAN) tablet 0.5 mg  0.5 mg Oral QHS Isla Pence, MD      . losartan (COZAAR) tablet 100 mg  100 mg Oral Daily Isla Pence, MD      . metFORMIN (GLUCOPHAGE) tablet 500 mg  500 mg Oral BID WC Isla Pence, MD      . mometasone-formoterol [facility]) 200-5 MCG/ACT inhaler 2 puff  2 puff Inhalation BID Isla Pence, MD      . pantoprazole (PROTONIX) EC tablet 40 mg  40 mg Oral Daily Isla Pence, MD      . PARoxetine (PAXIL) tablet 20 mg  20 mg Oral Daily Isla Pence, MD      . potassium chloride SA (K-DUR,KLOR-CON) CR tablet 20 mEq  20 mEq Oral Daily Isla Pence, MD      . PRESERVISION AREDS 2 CAPS 1 capsule  1 capsule Oral BID Isla Pence, MD      . simvastatin (ZOCOR) tablet 20 mg  20 mg Oral q1800 Isla Pence, MD       Current Outpatient Prescriptions  Medication Sig Dispense Refill  . ACETAMINOPHEN PO Take 650 mg by mouth every 6 (six) hours as needed for fever.     Marland Kitchen albuterol (PROVENTIL HFA;VENTOLIN HFA) 108 (90 Base) MCG/ACT inhaler Inhale 2 puffs into the lungs every 4 (four) hours as needed for wheezing or shortness of breath.    Marland Kitchen albuterol (PROVENTIL) (2.5 MG/3ML) 0.083% nebulizer solution Take 2.5 mg by nebulization every 4 (four) hours as needed for wheezing or shortness of breath.    Marland Kitchen amLODipine (NORVASC) 10 MG tablet Take 1 tablet (10 mg total) by mouth daily. 30 tablet 6  . brimonidine (ALPHAGAN) 0.15 % ophthalmic solution Place 1 drop into the left eye 2 (two) times daily. 5 mL 12  . budesonide-formoterol (SYMBICORT) 160-4.5 MCG/ACT inhaler Inhale 2 puffs into the lungs 2 (two) times daily.    . Cholecalciferol (VITAMIN D) 1000 UNITS capsule Take 1,000 Units by mouth daily.      . cloNIDine (CATAPRES) 0.2 MG tablet Take 0.5 tablets (0.1 mg total) by mouth daily as needed (for SBP >140). Only take 1/2 tablet if systolic is over [id number]. 30 tablet 0  . clopidogrel (PLAVIX) 75 MG tablet TAKE 1 TABLET BY MOUTH EVERY DAY 90 tablet 3  . donepezil (ARICEPT)  10 MG tablet Take 1 tablet (10 mg total) by mouth at bedtime. 90 tablet 3  . furosemide (LASIX) 40 MG tablet Take 40 mg by mouth daily.    Marland Kitchen LORazepam (ATIVAN) 0.5 MG tablet Take 1 tablet (0.5 mg total) by mouth at bedtime. 30 tablet 3  . losartan (COZAAR) 100 MG tablet Take 1 tablet (100 mg total) by mouth daily. 90 tablet 1  . metFORMIN (GLUCOPHAGE) 500 MG tablet Take 1 tablet (500 mg total) by mouth 2 (two) times daily with a meal. 180 tablet 3  . Multiple Vitamins-Minerals (PRESERVISION  AREDS 2) CAPS Take 1 capsule by mouth 2 (two) times daily.    Marland Kitchen omeprazole (PRILOSEC) 20 MG capsule Take 20 mg by mouth 2 (two) times daily before a meal.     . PARoxetine (PAXIL) 20 MG tablet Take 1 tablet (20 mg total) by mouth daily. 30 tablet 6  . potassium chloride SA (K-DUR,KLOR-CON) 20 MEQ tablet Take 1 tablet (20 mEq total) by mouth daily. 90 tablet 1  . promethazine (PHENERGAN) 25 MG tablet Take 12.5 mg by mouth every 8 (eight) hours as needed for nausea or vomiting.     . simvastatin (ZOCOR) 20 MG tablet TAKE 1 TABLET BY MOUTH DAILY AT 6 PM 90 tablet 0  . vitamin B-12 (CYANOCOBALAMIN) 1000 MCG tablet Take 1,000 mcg by mouth daily.    Marland Kitchen doxycycline (VIBRA-TABS) 100 MG tablet Take 1 tablet (100 mg total) by mouth 2 (two) times daily. (Patient not taking: Reported on 03/19/2016) 14 tablet 0     Discharge Medications: Please see current medication list.  Relevant Imaging Results:  Relevant Lab Results:   Additional Information SSN [id number]  Burnis Medin, LCSW

## 2016-03-19 NOTE — Progress Notes (Signed)
CSW contacted by staff member Suanne Marker from [facility], staff reported that she would contact patient's wife via telephone to complete paperwork and informed CSW that no other documents were needed from [facility]. CSW thanked staff. CSW updated patient's wife and inquired if anything additional was needed, patient's wife replied nothing she could think of. CSW notified patient's RN and provided patient's RN with the number to call report [phone] Shirlean Mylar) when patient is ready to be transported. CSW signing off.

## 2016-03-19 NOTE — ED Notes (Signed)
Attempted to call report to [facility]. No answer.

## 2016-03-19 NOTE — Progress Notes (Signed)
CSW contacted by patient's wife Architectural technologist) inquiring about CSW progress with SNF placement. CSW informed patient's wife that CSW spoke with staff from [facility] and that patient could possibly get a bed at that facility. CSW informed patient's wife that CSW was going to reach back out to the patient's insurance in regards to authorization. Patient's wife inquired about authorization for transportation for patient, CSW informed patient's wife that CSW would ask. CSW agreed to keep patient's wife updated.  CSW contacted patient's insurance on-call nurse Izora Gala [phone]). On-call nurse reported that patient was approved for SNF and the authorization number is [id number]. CSW inquired about prior authorization for transportation for patient, on- call nurse reported that if patient is able to be transported in a wheel chair Lucianne Lei no prior authorization is required but if patient requires transportation via stretcher in non-emergency ambulance prior authorization is required. On-call nurse requested that CSW contact her if patient is not able to go to a facility today, CSW agreed.  CSW contacted by Suanne Marker ([facility]) staff. CSW informed staff that patient has authorization from insurance company for SNF and that PT recommends SNF for patient. Staff inquired about patient's wife ability to bring patient's medications from home, CSW agreed to ask patient's wife. Staff requested that CSW text authorization number, CSW agreed. Staff reported that it will be an hour or more before patient is able to arrive to facility.   CSW spoke with patient's wife. CSW informed patient's wife that patient's insurance authorized SNF placement and that patient received bed offer at [facility], patient's wife accepted. CSW inquired about patient's wife ability to bring patient's medications from home to facility, patient's wife reported that she would be able to. CSW informed patient's wife  about authorization in regards transportation, patient's wife reported that she would be willing to transport patient to facility with assistance with getting patient into and out of car. Patient's wife asked patient if he was okay with riding in the car to SNF, patient replied yes. CSW agreed to ask staff from facility about assistance getting patient out of the car and inform patient's RN that patient received placement and that patient required assistance getting into car. CSW informed patient's wife that it may be an hour or more before patient is able to arrive at facility, patient's wife reported that she would go get patient's medications while she was waiting.   CSW texted staff member Suanne Marker from [facility] patient's authorization number and that patient's wife would be able to bring patient's medications. CSW asked staff about patient's wife receiving assistance with getting patient out of the car, staff reported that someone would be available to assist patient's wife.   CSW notified EDP that patient received placement and requested that EDP sign patient's FL2, EDP agreed.

## 2016-03-19 NOTE — Progress Notes (Addendum)
CSW contacted patient's EDP and inquired about consult. Patient's EDP reported that patient recently had PT through home health and is declining. EDP reported that PT consult has been made, CSW paged weekend PT.  CSW contacted patient's insurance ([employer]) and spoke with the on-call nurse regarding authorization for SNF. CSW provided on-call nurse with requested information and contact information to reach CSW.  CSW spoke with patient's wife Leviticus Gupte), patient present non-verbal when CSW asked him questions. Patient's wife reported that she needs more assistance with patient and has been having a hard time caring for patient at home. Patient's wife reported that patient's health has been declining and that she's interested in SNF for patient. Patient's wife reported that patient utilizes [facility] and that she has a hard time getting patient into bed. Patient's wife reports that she assists patient with bathing, feeding and dressing. CSW provided patient's wife with local SNF resource and CSW contact information.   CSW contacted by patient's wife Nivin Salz [phone]) Patient's wife reported that she preferably would like the patient to go to: [facility], [facility] or [facility]. CSW agreed to send patient's information to those facilities for consideration.   CSW contacted by PT Baxter Flattery). PT reported that they would see patient in approx. 30-45 minutes.  CSW contacted [facility] to inquire about bed availability. CSW spoke with staff named Suanne Marker, staff reported that it was a possibility that patient may be able to get a bed at their facility. Staff requested that CSW send patient's information via the [place], [place] agreed.

## 2016-03-19 NOTE — NC FL2 (Signed)
[place] LEVEL OF CARE SCREENING TOOL     IDENTIFICATION  Patient Name: Chad [name] Birthdate: 1930-01-13 Sex: male Admission Date (Current Location): 03/19/2016  [facility] and [place] Number:  Herbalist and Address:  [facility],  [place], [place]      Provider Number: [id number]  Attending Physician Name and Address:  Chad Pence, MD  Relative Name and Phone Number:  Chad [name] [phone]    Current Level of Care: Hospital Recommended Level of Care: [place] Prior Approval Number:    Date Approved/Denied:   PASRR Number: [id number]  Discharge Plan: SNF    Current Diagnoses: Patient Active Problem List   Diagnosis Date Noted  . Anemia 02/27/2016  . CAP (community acquired pneumonia) 11/27/2015  . Diabetes mellitus without complication ([place])   . Stroke ([place])   . Hypertension   . Hyperlipidemia   . Weakness   . Pseudobulbar affect 07/02/2015  . Controlled diabetes mellitus type 2 with complications ([place]) [id number]  . GERD without esophagitis 06/12/2015  . Cerebral thrombosis with cerebral infarction 06/05/2015  . HCAP (healthcare-associated pneumonia) 06/02/2015  . Urinary incontinence 04/13/2015  . Moderate dementia with behavioral disturbance 03/09/2015  . Depression 03/09/2015  . Legal blindness [place] 08/07/2014  . Routine general medical examination at a health care facility 04/24/2014  . Carotid stenosis 02/24/2014  . Barrett's esophagus 10/11/2011  . FATTY LIVER DISEASE 08/27/2008  . Glaucoma 05/20/2008  . Essential hypertension 05/20/2008  . COPD GOLD II  05/20/2008  . ARTHRITIS 05/20/2008    Orientation RESPIRATION BLADDER Height & Weight     Self, Place  Normal Incontinent (wears depends) Weight: 180 lb (81.6 kg) Height:     BEHAVIORAL SYMPTOMS/MOOD NEUROLOGICAL BOWEL NUTRITION STATUS      Incontinent  (wears depends) Diet (low sodium)  AMBULATORY STATUS  COMMUNICATION OF NEEDS Skin   Extensive Assist Verbally (verbal with difficulty) Normal                       Personal Care Assistance Level of Assistance  Bathing, Feeding, Dressing Bathing Assistance: Maximum assistance Feeding assistance: Maximum assistance Dressing Assistance: Maximum assistance     Functional Limitations Info  Hearing, Speech, Sight Sight Info: Impaired Hearing Info: Impaired Speech Info: Impaired (Verbal with difficulty)    SPECIAL CARE FACTORS FREQUENCY  PT (By licensed PT), OT (By licensed OT), Blood pressure Blood Pressure Frequency: Per wife report: every few days   PT Frequency: 5x OT Frequency: 5x            Contractures Contractures Info: Not present    Additional Factors Info  Code Status, Allergies Code Status Info: Full code  Allergies Info: Moxifloxacin, Quinolones, Sulfonamide Derivatives, Timolol Maleate, Amoxicillin, Augmentin, Sulfa Antibiotics            Current Medications (03/19/2016):  This is the current hospital active medication list Current Facility-Administered Medications  Medication Dose Route Frequency Provider Last Rate Last Dose  . acetaminophen (TYLENOL) tablet 650 mg  650 mg Oral Q6H PRN Chad Pence, MD      . albuterol (PROVENTIL HFA;VENTOLIN HFA) 108 (90 Base) MCG/ACT inhaler 2 puff  2 puff Inhalation Q4H PRN Chad Pence, MD      . albuterol (PROVENTIL) (2.5 MG/3ML) 0.083% nebulizer solution 2.5 mg  2.5 mg Nebulization Q4H PRN Chad Pence, MD      . amLODipine (NORVASC) tablet 10 mg  10 mg Oral Daily Chad Pence,  MD      . brimonidine (ALPHAGAN) 0.15 % ophthalmic solution 1 drop  1 drop Left Eye BID Chad Pence, MD      . cloNIDine (CATAPRES) tablet 0.1 mg  0.1 mg Oral Daily PRN Chad Pence, MD      . clopidogrel (PLAVIX) tablet 75 mg  75 mg Oral Daily Chad Pence, MD      . donepezil (ARICEPT) tablet 10 mg  10 mg Oral QHS Chad Pence, MD      . furosemide (LASIX) tablet 40 mg  40 mg  Oral Daily Chad Pence, MD      . LORazepam (ATIVAN) tablet 0.5 mg  0.5 mg Oral QHS Chad Pence, MD      . losartan (COZAAR) tablet 100 mg  100 mg Oral Daily Chad Pence, MD      . metFORMIN (GLUCOPHAGE) tablet 500 mg  500 mg Oral BID WC Chad Pence, MD      . mometasone-formoterol [facility]) 200-5 MCG/ACT inhaler 2 puff  2 puff Inhalation BID Chad Pence, MD      . pantoprazole (PROTONIX) EC tablet 40 mg  40 mg Oral Daily Chad Pence, MD      . PARoxetine (PAXIL) tablet 20 mg  20 mg Oral Daily Chad Pence, MD      . potassium chloride SA (K-DUR,KLOR-CON) CR tablet 20 mEq  20 mEq Oral Daily Chad Pence, MD      . PRESERVISION AREDS 2 CAPS 1 capsule  1 capsule Oral BID Chad Pence, MD      . simvastatin (ZOCOR) tablet 20 mg  20 mg Oral q1800 Chad Pence, MD       Current Outpatient Prescriptions  Medication Sig Dispense Refill  . ACETAMINOPHEN PO Take 650 mg by mouth every 6 (six) hours as needed for fever.     Marland Kitchen albuterol (PROVENTIL HFA;VENTOLIN HFA) 108 (90 Base) MCG/ACT inhaler Inhale 2 puffs into the lungs every 4 (four) hours as needed for wheezing or shortness of breath.    Marland Kitchen albuterol (PROVENTIL) (2.5 MG/3ML) 0.083% nebulizer solution Take 2.5 mg by nebulization every 4 (four) hours as needed for wheezing or shortness of breath.    Marland Kitchen amLODipine (NORVASC) 10 MG tablet Take 1 tablet (10 mg total) by mouth daily. 30 tablet 6  . brimonidine (ALPHAGAN) 0.15 % ophthalmic solution Place 1 drop into the left eye 2 (two) times daily. 5 mL 12  . budesonide-formoterol (SYMBICORT) 160-4.5 MCG/ACT inhaler Inhale 2 puffs into the lungs 2 (two) times daily.    . Cholecalciferol (VITAMIN D) 1000 UNITS capsule Take 1,000 Units by mouth daily.      . cloNIDine (CATAPRES) 0.2 MG tablet Take 0.5 tablets (0.1 mg total) by mouth daily as needed (for SBP >140). Only take 1/2 tablet if systolic is over [id number]. 30 tablet 0  . clopidogrel (PLAVIX) 75 MG tablet TAKE 1 TABLET BY MOUTH EVERY  DAY 90 tablet 3  . donepezil (ARICEPT) 10 MG tablet Take 1 tablet (10 mg total) by mouth at bedtime. 90 tablet 3  . furosemide (LASIX) 40 MG tablet Take 40 mg by mouth daily.    Marland Kitchen LORazepam (ATIVAN) 0.5 MG tablet Take 1 tablet (0.5 mg total) by mouth at bedtime. 30 tablet 3  . losartan (COZAAR) 100 MG tablet Take 1 tablet (100 mg total) by mouth daily. 90 tablet 1  . metFORMIN (GLUCOPHAGE) 500 MG tablet Take 1 tablet (500 mg total) by mouth 2 (two) times daily with a meal. 180  tablet 3  . Multiple Vitamins-Minerals (PRESERVISION AREDS 2) CAPS Take 1 capsule by mouth 2 (two) times daily.    Marland Kitchen omeprazole (PRILOSEC) 20 MG capsule Take 20 mg by mouth 2 (two) times daily before a meal.     . PARoxetine (PAXIL) 20 MG tablet Take 1 tablet (20 mg total) by mouth daily. 30 tablet 6  . potassium chloride SA (K-DUR,KLOR-CON) 20 MEQ tablet Take 1 tablet (20 mEq total) by mouth daily. 90 tablet 1  . promethazine (PHENERGAN) 25 MG tablet Take 12.5 mg by mouth every 8 (eight) hours as needed for nausea or vomiting.     . simvastatin (ZOCOR) 20 MG tablet TAKE 1 TABLET BY MOUTH DAILY AT 6 PM 90 tablet 0  . vitamin B-12 (CYANOCOBALAMIN) 1000 MCG tablet Take 1,000 mcg by mouth daily.    Marland Kitchen doxycycline (VIBRA-TABS) 100 MG tablet Take 1 tablet (100 mg total) by mouth 2 (two) times daily. (Patient not taking: Reported on 03/19/2016) 14 tablet 0     Discharge Medications: Please see current medication list.  Relevant Imaging Results:  Relevant Lab Results:   Additional Information SSN [id number]  Burnis Medin, LCSW

## 2016-03-19 NOTE — ED Notes (Signed)
Bed: WA06 Expected date:  Expected time:  Means of arrival: Ambulance Comments: EMS 83 yo UTI?

## 2016-03-19 NOTE — ED Notes (Signed)
Pt at bedside

## 2016-03-20 NOTE — Care Management Note (Signed)
Case Management Note  Patient Details  Name: Chad [name] MRN: [id number] Date of Birth: Dec 11, 1929  Subjective/Objective:   Generalized weakness                 Action/Plan: Discharge Planning: AVS reviewed:   03/19/2016 1600 NCM spoke to pt and wife at bedside. Wife had questions about Home Hospice once pt has completed rehab. States she prefers [facility]. Reports he has RW with seat, RW, bedside commode and lift chair at home. Explained SNF CSW will assist arranging Home Hospice. He goes to the [facility] and spoken to [place] about Respite services. Explained to pt she can also discuss having aide come in that may sometimes be covered under his VA benefits. Wife [facility]. States she has made attempts to reach out to the SW to assist but has not received a phone call. Faxed H&P and progress notes to [place].  [facility] PCP Dr Trellis Moment   PCP Pricilla Holm A   Expected Discharge Date:  03/19/2016              Expected Discharge Plan:  [place]  In-House Referral:  Clinical Social Work  Discharge planning Services  CM Consult  Post Acute Care Choice:  NA Choice offered to:  NA  DME Arranged:  N/A DME Agency:  NA  HH Arranged:  NA HH Agency:  NA  Status of Service:  Completed, signed off  If discussed at [employer] of Stay Meetings, dates discussed:    Additional Comments:  Erenest Rasher, RN 03/20/2016, 9:52 AM

## 2016-03-21 NOTE — Telephone Encounter (Signed)
Contacted patients wife, she stated that patient went to er on Saturday and was dehydrated, is not in [place] care approved for 7 days, and I explained to her about palliative care, wife will call back if she has any questions

## 2016-03-23 ENCOUNTER — Non-Acute Institutional Stay (SKILLED_NURSING_FACILITY): Payer: PPO | Admitting: Internal Medicine

## 2016-03-23 ENCOUNTER — Other Ambulatory Visit: Payer: Self-pay | Admitting: Internal Medicine

## 2016-03-23 DIAGNOSIS — I69319 Unspecified symptoms and signs involving cognitive functions following cerebral infarction: Secondary | ICD-10-CM | POA: Diagnosis not present

## 2016-03-23 DIAGNOSIS — I69393 Ataxia following cerebral infarction: Secondary | ICD-10-CM

## 2016-03-23 DIAGNOSIS — E1121 Type 2 diabetes mellitus with diabetic nephropathy: Secondary | ICD-10-CM

## 2016-03-23 NOTE — Progress Notes (Signed)
03/23/16  [facility]  Chief complaint; admitted from home for declining mobility status.  History; this is very disabled man who has a history of a stroke a year ago. History of carotid artery stenosis and bilateral carotid endarterectomy several years ago. He was hospitalized from 2/3 through 02/29/16 with bronchitis or early pneumonia. He was discharged home. He had physical therapy through the [place] and apparently was doing fairly well up until last week when he was found to be reasonably immobilized. His wife states that she thought he was having a stroke or mini stroke.he was brought to the emergency room. Believe anything really was found. In looking at his lab work is be any creatinine were a little higher than baseline. Placement here was arranged for rehabilitation. Per his wife he ate well today he's been up walking with a walker with physical therapy. His mobility seems back to baseline.  I spent some time talking to his wife about this man's course. Apparently he has had dementia for 5years or so. I see a Folstein Mini-Mental status of 14 out of 30 by neurology from sometime in 2017.e is fairly dependent on his wife. The only neurologic diagnosis that he is ever had his strokes and apparently he has seen neurology both in the hospitaland as an outpatient.  BMP Latest Ref Rng & Units 03/19/2016 02/29/2016 02/27/2016  Glucose 65 - 99 mg/dL 136(H) 133(H) 110(H)  BUN 6 - 20 mg/dL 45(H) 15 30(H)  Creatinine 0.61 - 1.24 mg/dL 1.69(H) 1.25(H) 1.44(H)  Sodium 135 - 145 mmol/L 135 137 136  Potassium 3.5 - 5.1 mmol/L 4.3 3.9 4.8  Chloride 101 - 111 mmol/L 97(L) 103 99(L)  CO2 22 - 32 mmol/L 24 26 27   Calcium 8.9 - 10.3 mg/dL 10.1 9.0 10.2   CBC Latest Ref Rng & Units 03/19/2016 02/29/2016 02/27/2016  WBC 4.0 - 10.5 K/uL 8.4 9.3 10.6(H)  Hemoglobin 13.0 - 17.0 g/dL 15.2 11.1(L) 11.5(L)  Hematocrit 39.0 - 52.0 % 45.3 34.6(L) 36.0(L)  Platelets 150 - 400 K/uL 245 160 177    Past Medical  History:  Diagnosis Date  . Acid reflux disease   . Arthritis    "all over"  . Arthritis   . Barrett's esophagus   . Basal cell carcinoma of nose   . CAROTID ARTERY STENOSIS, BILATERAL 08/27/2008  . Chronic bronchitis ([place])    "most q yr; he's had it several times" (12/20/2013)  . COPD (chronic obstructive pulmonary disease) ([place])    "severe" (12/20/2013)  . COPD (chronic obstructive pulmonary disease) ([place])   . Coronary artery stenosis   . Diabetes mellitus without complication ([place])   . Diaphragmatic hernia without mention of obstruction or gangrene   . Diverticulitis   . Diverticulosis of colon   . Dyspnea   . Esophageal stenosis   . Fatty liver disease, nonalcoholic   . GERD (gastroesophageal reflux disease)   . Glaucoma   . Glaucoma   . High cholesterol   . History of blood transfusion 2007   "related to OR"  . History of hiatal hernia    "repaired when esophagus removed"  . History of stomach ulcers   . HTN (hypertension)   . Hyperlipidemia   . Hypertension   . Kidney stones    "passed them all" (12/20/2013)  . Memory loss    DEMENTIA  . Personal history of other malignant neoplasm of skin   . Pneumonia 1990's X 4  . RENAL CALCULUS, HX  OF 08/28/2008  . Renal disorder   . Sinus headache    "seasonal"  . Stroke ([place])   . TIA (transient ischemic attack) 1998 X "a few"  . Type II diabetes mellitus ([place])     Past Surgical History:  Procedure Laterality Date  . BASAL CELL CARCINOMA EXCISION    . CAROTID ARTERY ANGIOPLASTY    . CAROTID ENDARTERECTOMY Bilateral 02/1996-03/1996  . CATARACT EXTRACTION W/ INTRAOCULAR LENS IMPLANT Bilateral 04/2007 - 09/2007   "left-right"  . ESOPHAGECTOMY  2007   with stomach pull through  . ESOPHAGOGASTRODUODENOSCOPY (EGD) WITH ESOPHAGEAL DILATION  10/2011  . EYE SURGERY Right 04/2006; 08/2007   "had gas bubbles put in"  . EYE SURGERY Right 01/2007   "air bubble"  . EYE SURGERY Left 12/12/2013   "cleaned his implant w/laser"  . EYE  SURGERY    . GLAUCOMA SURGERY Right 11/2008   "implanted drain tube"  . HERNIA REPAIR    . KNEE ARTHROSCOPY Bilateral 04/1989; 08/1996; 06/1999   "right; left; left"  . KNEE SURGERY    . MOHS SURGERY  ?2002   "tip of nose; basal cell"  . NASAL POLYP EXCISION  2005   "benign"  . PARS PLANA VITRECTOMY Bilateral 01/2007-05/2007  . SHOULDER ARTHROSCOPY Right 04/1998  . SHOULDER SURGERY    . VENTRAL HERNIA REPAIR  02/2006    Current Outpatient Prescriptions on File Prior to Visit  Medication Sig Dispense Refill  . ACETAMINOPHEN PO Take 650 mg by mouth every 6 (six) hours as needed for fever.     Marland Kitchen albuterol (PROVENTIL HFA;VENTOLIN HFA) 108 (90 Base) MCG/ACT inhaler Inhale 2 puffs into the lungs every 4 (four) hours as needed for wheezing or shortness of breath.    Marland Kitchen albuterol (PROVENTIL) (2.5 MG/3ML) 0.083% nebulizer solution Take 2.5 mg by nebulization every 4 (four) hours as needed for wheezing or shortness of breath.    Marland Kitchen amLODipine (NORVASC) 10 MG tablet TAKE 1 TABLET(10 MG) BY MOUTH DAILY 30 tablet 2  . brimonidine (ALPHAGAN) 0.15 % ophthalmic solution Place 1 drop into the left eye 2 (two) times daily. 5 mL 12  . budesonide-formoterol (SYMBICORT) 160-4.5 MCG/ACT inhaler Inhale 2 puffs into the lungs 2 (two) times daily.    . Cholecalciferol (VITAMIN D) 1000 UNITS capsule Take 1,000 Units by mouth daily.      . cloNIDine (CATAPRES) 0.2 MG tablet Take 0.5 tablets (0.1 mg total) by mouth daily as needed (for SBP >140). Only take 1/2 tablet if systolic is over [id number]. 30 tablet 0  . clopidogrel (PLAVIX) 75 MG tablet TAKE 1 TABLET BY MOUTH EVERY DAY 90 tablet 3  . donepezil (ARICEPT) 10 MG tablet Take 1 tablet (10 mg total) by mouth at bedtime. 90 tablet 3  . doxycycline (VIBRA-TABS) 100 MG tablet Take 1 tablet (100 mg total) by mouth 2 (two) times daily. (Patient not taking: Reported on 03/19/2016) 14 tablet 0  . furosemide (LASIX) 40 MG tablet Take 40 mg by mouth daily.    Marland Kitchen LORazepam (ATIVAN) 0.5  MG tablet Take 1 tablet (0.5 mg total) by mouth at bedtime. 30 tablet 3  . losartan (COZAAR) 100 MG tablet Take 1 tablet (100 mg total) by mouth daily. 90 tablet 1  . metFORMIN (GLUCOPHAGE) 500 MG tablet Take 1 tablet (500 mg total) by mouth 2 (two) times daily with a meal. 180 tablet 3  . Multiple Vitamins-Minerals (PRESERVISION AREDS 2) CAPS Take 1 capsule by mouth 2 (two) times daily.    Marland Kitchen  omeprazole (PRILOSEC) 20 MG capsule Take 20 mg by mouth 2 (two) times daily before a meal.     . PARoxetine (PAXIL) 20 MG tablet Take 1 tablet (20 mg total) by mouth daily. 30 tablet 6  . potassium chloride SA (K-DUR,KLOR-CON) 20 MEQ tablet Take 1 tablet (20 mEq total) by mouth daily. 90 tablet 1  . promethazine (PHENERGAN) 25 MG tablet Take 12.5 mg by mouth every 8 (eight) hours as needed for nausea or vomiting.     . simvastatin (ZOCOR) 20 MG tablet TAKE 1 TABLET BY MOUTH DAILY AT 6 PM 90 tablet 0  . vitamin B-12 (CYANOCOBALAMIN) 1000 MCG tablet Take 1,000 mcg by mouth daily.      Social;  reports that he quit smoking about 38 years ago. His smoking use included Cigarettes. He has a 40.00 pack-year smoking history. He has never used smokeless tobacco. He reports that he does not drink alcohol or use drugs. Functionally he has a walker at home in the past has used a Museum/gallery conservator. He has totally dependent. Incontinent of urine  family history includes Colon cancer in his maternal grandfather and mother; Esophageal cancer in his father; Heart attack in his brother, brother, and mother; Lung cancer in his mother; Stomach cancer in his father.  Review of systems; not really possible from the patient. In discussion with his wife; HEENT; he does not complain of double vision. Constantly wiping his nose due l discharge although it almost looks like he wipes his mouth as well Respiratory COPD followed by Dr. Shyrl Numbers Cardiac no real complaints of chest pain.he's been on Lasix for lower extremity edema GI currently  feels constipated GU incontinent and wears depends  Physical examination Gen. Patient is not in any distress.she seems to have trouble holding his head up seems to fatigue HEENT mucous membranes are moist. Respiratory shallow air entry bilaterally no crackles or wheezes Cardiac heart sounds are normal no murmurs Abdomen large incisional herniahowever no tenderness no masses GU bladder is not overtly enlarged Neurologic;  Cranial nerves; no obvious ptosis. He does not seem to have conjugate left gaze or upgaze. Downgaze is preserved slight right gaze                      He seems to have almost a snarl all appearance. Although his fifth cranial nerve seems to be intact. He did not protrude his tongue                      Motor; could not get him to lift his left arm up at the shoulder although his grip strength was excellent. He has no overt increase in tone                      Diffusely hyporeflexic no Hoffman's reflex Speech; very soft formation although I didn'tsence time cortical language loss    Impression/plan #1 the major source of this man's disability seems to be neurologic. He has moderately advanced dementiabased on a Folstein score of 14 sometime last year. The exact diagnosis here is unclear. This would be very atypical for Alzheimer's disease. Wonder about multi-infarct dementia,PSNP???? #2 the fatigue in his neck extension lead me to wonder about myasthenia although he does not have diplopia and ptosis.  In fact I cannot even get a left or superior gaze. I think he probably should go back to see neurology and I wonder about relevant blood testing. #3He  had some increase in his BUN and creatinine when he is in the emergency room. It wouldn't be unreasonable to reduce his Lasix #4 probably should have a total CK check, he is on Zocor opiate albeit at a low dose #5 COPD; this is significant but not unstable #6 apparently recurrent pneumonia. He has had an esophagectomy but  apparently swallows fairly well but has reflux #7type 2 diabetes on metformin.  He has a very devoted wife appears firm at home. I don't think there will be a discharge issue here although I think she is having increasingly difficult.he has marked neurologic disability. I wonder whether the MRI shown below gives [place] the full explanation for     Study Result   CLINICAL DATA:  Stroke.  Hypertension diabetes COPD  EXAM: MRI HEAD WITHOUT CONTRAST  TECHNIQUE: Multiplanar, multiecho pulse sequences of the brain and surrounding structures were obtained without intravenous contrast.  COMPARISON:  None.  FINDINGS: Acute infarct in the right corona radiata white matter. Acute infarct extends into the right parietal cortex and right posterior temporal lobe. Acute infarct in the right occipital lobe . Small area of acute infarct in the left parietal periventricular white matter.  Moderate to advanced atrophy.  Mild chronic microvascular ischemic change in the white matter. Brainstem and cerebellum intact.  Negative for intracranial hemorrhage.  Negative for mass or edema.  No shift of the midline structures.  Paranasal sinuses clear. Normal orbit. Pituitary normal in size. Skullbase intact.  IMPRESSION: Acute infarcts in the right parietal lobe. Small areas of acute infarct in the right occipital lobe, right posterior temporal lobe, and left parietal white matter.  Advanced atrophy.   Electronically Signed   By: Franchot Gallo M.D.   On: 06/04/2015 19:52

## 2016-03-28 LAB — BASIC METABOLIC PANEL
BUN: 21 mg/dL (ref 4–21)
Creatinine: 1.3 mg/dL (ref 0.6–1.3)
Glucose: 124 mg/dL
POTASSIUM: 5.1 mmol/L (ref 3.4–5.3)
Sodium: 139 mmol/L (ref 137–147)

## 2016-03-29 DIAGNOSIS — R531 Weakness: Secondary | ICD-10-CM | POA: Diagnosis not present

## 2016-03-30 ENCOUNTER — Other Ambulatory Visit: Payer: Self-pay | Admitting: Internal Medicine

## 2016-04-06 ENCOUNTER — Encounter: Payer: Self-pay | Admitting: Nurse Practitioner

## 2016-04-06 ENCOUNTER — Non-Acute Institutional Stay (SKILLED_NURSING_FACILITY): Payer: PPO | Admitting: Nurse Practitioner

## 2016-04-06 DIAGNOSIS — I10 Essential (primary) hypertension: Secondary | ICD-10-CM

## 2016-04-06 DIAGNOSIS — K219 Gastro-esophageal reflux disease without esophagitis: Secondary | ICD-10-CM

## 2016-04-06 DIAGNOSIS — I69319 Unspecified symptoms and signs involving cognitive functions following cerebral infarction: Secondary | ICD-10-CM | POA: Diagnosis not present

## 2016-04-06 DIAGNOSIS — E1122 Type 2 diabetes mellitus with diabetic chronic kidney disease: Secondary | ICD-10-CM

## 2016-04-06 DIAGNOSIS — N182 Chronic kidney disease, stage 2 (mild): Secondary | ICD-10-CM | POA: Diagnosis not present

## 2016-04-06 DIAGNOSIS — R5381 Other malaise: Secondary | ICD-10-CM | POA: Diagnosis not present

## 2016-04-06 DIAGNOSIS — F03B18 Unspecified dementia, moderate, with other behavioral disturbance: Secondary | ICD-10-CM

## 2016-04-06 DIAGNOSIS — F0391 Unspecified dementia with behavioral disturbance: Secondary | ICD-10-CM

## 2016-04-06 NOTE — Progress Notes (Signed)
Nursing Home Location:  [facility]  Place of Service: SNF (31)  PCP: Hoyt Koch, MD  Allergies  Allergen Reactions  . Moxifloxacin Anaphylaxis    Avelox REACTION: anaphylaxis  . Quinolones Anaphylaxis  . Sulfonamide Derivatives Hives  . Timolol Maleate Other (See Comments)    Causes severe chest congestion  . Amoxicillin Other (See Comments)    Rash, sweating, sob Has patient had a PCN reaction causing immediate rash, facial/tongue/throat swelling, SOB or lightheadedness with hypotension: Yes Has patient had a PCN reaction causing severe rash involving mucus membranes or skin necrosis: Yes Has patient had a PCN reaction that required hospitalization Yes Has patient had a PCN reaction occurring within the last 10 years: Yes If all of the above answers are "NO", then may proceed with Cephalosporin use.   . Augmentin [Amoxicillin-Pot Clavulanate] Other (See Comments)    Anaphylaxis  Has patient had a PCN reaction causing immediate rash, facial/tongue/throat swelling, SOB or lightheadedness with hypotension: Yes Has patient had a PCN reaction causing severe rash involving mucus membranes or skin necrosis: Yes Has patient had a PCN reaction that required hospitalization Yes Has patient had a PCN reaction occurring within the last 10 years: Yes If all of the above answers are "NO", then may proceed with Cephalosporin use.   . Sulfa Antibiotics Rash    Chief Complaint  Patient presents with  . Discharge Note    Resident is being discharged from SNF.     HPI:  Patient is a 83 y.o. male seen today at [facility] for discharge home. Pt was admitted after a decline in his mobility status. He had a history of a stroke a year ago and hx of dementia. History of carotid artery stenosis and bilateral carotid endarterectomy several years ago. He was hospitalized from 2/3 through 02/29/16 with bronchitis or early pneumonia and then was discharged home. He  had physical therapy through the [place] and apparently was doing fairly well up until he was found to be reasonably immobilized. He was rought to the emergency room because his wife thought he may be having a stroke however there was no significant findings and he was admitted to [facility] for rehab. Patient currently doing well with therapy, now stable to discharge home with his wife who is his primary care giver and home health.   Review of Systems:  Review of Systems  Unable to perform ROS: Dementia    Past Medical History:  Diagnosis Date  . Acid reflux disease   . Arthritis    "all over"  . Arthritis   . Barrett's esophagus   . Basal cell carcinoma of nose   . CAROTID ARTERY STENOSIS, BILATERAL 08/27/2008  . Chronic bronchitis ([place])    "most q yr; he's had it several times" (12/20/2013)  . COPD (chronic obstructive pulmonary disease) ([place])    "severe" (12/20/2013)  . COPD (chronic obstructive pulmonary disease) ([place])   . Coronary artery stenosis   . Diabetes mellitus without complication ([place])   . Diaphragmatic hernia without mention of obstruction or gangrene   . Diverticulitis   . Diverticulosis of colon   . Dyspnea   . Esophageal stenosis   . Fatty liver disease, nonalcoholic   . GERD (gastroesophageal reflux disease)   . Glaucoma   . Glaucoma   . High cholesterol   . History of blood transfusion 2007   "related to OR"  . History of hiatal hernia    "repaired when esophagus removed"  .  History of stomach ulcers   . HTN (hypertension)   . Hyperlipidemia   . Hypertension   . Kidney stones    "passed them all" (12/20/2013)  . Memory loss    DEMENTIA  . Personal history of other malignant neoplasm of skin   . Pneumonia 1990's X 4  . RENAL CALCULUS, HX OF 08/28/2008  . Renal disorder   . Sinus headache    "seasonal"  . Stroke ([place])   . TIA (transient ischemic attack) 1998 X "a few"  . Type II diabetes mellitus ([place])    Past Surgical History:  Procedure Laterality  Date  . BASAL CELL CARCINOMA EXCISION    . CAROTID ARTERY ANGIOPLASTY    . CAROTID ENDARTERECTOMY Bilateral 02/1996-03/1996  . CATARACT EXTRACTION W/ INTRAOCULAR LENS IMPLANT Bilateral 04/2007 - 09/2007   "left-right"  . ESOPHAGECTOMY  2007   with stomach pull through  . ESOPHAGOGASTRODUODENOSCOPY (EGD) WITH ESOPHAGEAL DILATION  10/2011  . EYE SURGERY Right 04/2006; 08/2007   "had gas bubbles put in"  . EYE SURGERY Right 01/2007   "air bubble"  . EYE SURGERY Left 12/12/2013   "cleaned his implant w/laser"  . EYE SURGERY    . GLAUCOMA SURGERY Right 11/2008   "implanted drain tube"  . HERNIA REPAIR    . KNEE ARTHROSCOPY Bilateral 04/1989; 08/1996; 06/1999   "right; left; left"  . KNEE SURGERY    . MOHS SURGERY  ?2002   "tip of nose; basal cell"  . NASAL POLYP EXCISION  2005   "benign"  . PARS PLANA VITRECTOMY Bilateral 01/2007-05/2007  . SHOULDER ARTHROSCOPY Right 04/1998  . SHOULDER SURGERY    . VENTRAL HERNIA REPAIR  02/2006   Social History:   reports that he quit smoking about 38 years ago. His smoking use included Cigarettes. He has a 40.00 pack-year smoking history. He has never used smokeless tobacco. He reports that he does not drink alcohol or use drugs.  Family History  Problem Relation Age of Onset  . Colon cancer Mother   . Lung cancer Mother   . Heart attack Mother   . Stomach cancer Father   . Esophageal cancer Father   . Colon cancer Maternal Grandfather   . Heart attack Brother   . Heart attack Brother     Medications: Patient's Medications  New Prescriptions   No medications on file  Previous Medications   ACETAMINOPHEN PO    Take 650 mg by mouth every 6 (six) hours as needed for fever.    ALBUTEROL (PROVENTIL HFA;VENTOLIN HFA) 108 (90 BASE) MCG/ACT INHALER    Inhale 2 puffs into the lungs every 4 (four) hours as needed for wheezing or shortness of breath.   ALBUTEROL (PROVENTIL) (2.5 MG/3ML) 0.083% NEBULIZER SOLUTION    Take 2.5 mg by nebulization every 4 (four)  hours as needed for wheezing or shortness of breath.   AMLODIPINE (NORVASC) 10 MG TABLET    TAKE 1 TABLET(10 MG) BY MOUTH DAILY   BUDESONIDE-FORMOTEROL (SYMBICORT) 160-4.5 MCG/ACT INHALER    Inhale 2 puffs into the lungs 2 (two) times daily.   CHOLECALCIFEROL (VITAMIN D) 1000 UNITS CAPSULE    Take 1,000 Units by mouth daily.     CLONIDINE (CATAPRES) 0.2 MG TABLET    TAKE ONE-HALF TABLET BY MOUTH DAILY AS NEEDED ONLY IF SYSTOLIC IS OVER 979   CLOPIDOGREL (PLAVIX) 75 MG TABLET    TAKE 1 TABLET BY MOUTH EVERY DAY   DONEPEZIL (ARICEPT) 10 MG TABLET  Take 1 tablet (10 mg total) by mouth at bedtime.   FUROSEMIDE (LASIX) 20 MG TABLET    Take 20 mg by mouth daily.   LATANOPROST (XALATAN) 0.005 % OPHTHALMIC SOLUTION    Place 1 drop into the left eye 2 (two) times daily.   LORAZEPAM (ATIVAN) 0.5 MG TABLET    Take 1 tablet (0.5 mg total) by mouth at bedtime.   LOSARTAN (COZAAR) 100 MG TABLET    Take 1 tablet (100 mg total) by mouth daily.   METFORMIN (GLUCOPHAGE) 500 MG TABLET    Take 1 tablet (500 mg total) by mouth 2 (two) times daily with a meal.   MULTIPLE VITAMINS-MINERALS (PRESERVISION AREDS 2) CAPS    Take 1 capsule by mouth 2 (two) times daily.   OMEPRAZOLE (PRILOSEC) 20 MG CAPSULE    Take 20 mg by mouth 2 (two) times daily before a meal.    PANTOPRAZOLE (PROTONIX) 40 MG TABLET    Take 40 mg by mouth daily.   PAROXETINE (PAXIL) 20 MG TABLET    Take 1 tablet (20 mg total) by mouth daily.   POTASSIUM CHLORIDE SA (K-DUR,KLOR-CON) 20 MEQ TABLET    Take 1 tablet (20 mEq total) by mouth daily.   PROMETHAZINE (PHENERGAN) 25 MG TABLET    Take 12.5 mg by mouth every 8 (eight) hours as needed for nausea or vomiting.    SIMVASTATIN (ZOCOR) 20 MG TABLET    TAKE 1 TABLET BY MOUTH DAILY AT 6 PM   VITAMIN B-12 (CYANOCOBALAMIN) 1000 MCG TABLET    Take 1,000 mcg by mouth daily.  Modified Medications   No medications on file  Discontinued Medications   BRIMONIDINE (ALPHAGAN) 0.15 % OPHTHALMIC SOLUTION    Place 1  drop into the left eye 2 (two) times daily.   DOXYCYCLINE (VIBRA-TABS) 100 MG TABLET    Take 1 tablet (100 mg total) by mouth 2 (two) times daily.   FUROSEMIDE (LASIX) 40 MG TABLET    Take 40 mg by mouth daily.     Physical Exam: Vitals:   04/06/16 1128  BP: 132/60  Pulse: 70  Resp: (!) 22  Temp: 97.6 F (36.4 C)  SpO2: 95%  Weight: 160 lb (72.6 kg)  Height: 5\' 9"  (1.753 m)    Physical Exam  Constitutional: He appears well-developed and well-nourished. No distress.  HENT:  Head: Normocephalic.  Mouth/Throat: Oropharynx is clear and moist. No oropharyngeal exudate.  Eyes: Conjunctivae and EOM are normal. Pupils are equal, round, and reactive to light.  Neck: Normal range of motion. No JVD present. No thyromegaly present.  Cardiovascular: Normal rate, regular rhythm, normal heart sounds and intact distal pulses.  Exam reveals no gallop and no friction rub.   No murmur heard. Pulmonary/Chest: Effort normal and breath sounds normal. No respiratory distress. He has no wheezes. He has no rales.  Abdominal: Soft. Bowel sounds are normal. There is no tenderness. There is no rebound and no guarding.  Large Abdominal Hernia. Abdominal Binder in place.   Musculoskeletal: He exhibits no edema or tenderness.  Normal ROM. Left side weakness.   Lymphadenopathy:    He has no cervical adenopathy.  Neurological: He is alert.  Skin: Skin is warm and dry.  Psychiatric: He has a normal mood and affect.    Labs reviewed: Basic Metabolic Panel:  Recent Labs  06/01/15 1844  06/02/15 0200  06/05/15 0441  02/27/16 1643 02/27/16 2102 02/29/16 0503 03/19/16 1132 03/28/16  NA 135  < > 136  < >  142  < > 136  --  137 135 139  K 5.0  < > 4.3  < > 3.7  < > 4.8  --  3.9 4.3 5.1  CL 101  < > 102  < > 105  < > 99*  --  103 97*  --   CO2 17*  --  16*  < > 25  < > 27  --  26 24  --   GLUCOSE 306*  < > 346*  < > 133*  < > 110*  --  133* 136*  --   BUN 22*  < > 25*  < > 22*  < > 30*  --  15 45* 21    CREATININE 1.70*  < > 1.49*  < > 1.48*  < > 1.44*  --  1.25* 1.69* 1.3  CALCIUM 10.1  --  8.5*  < > 9.1  < > 10.2  --  9.0 10.1  --   MG 3.7*  --  2.0  --   --   --   --  1.6*  --   --   --   PHOS 9.0*  --  1.6*  --  4.0  --   --  3.8  --   --   --   < > = values in this interval not displayed. Liver Function Tests:  Recent Labs  02/27/16 1643 02/29/16 0503 03/19/16 1132  AST 31 14* 19  ALT 15* 16* 19  ALKPHOS 64 56 79  BILITOT 1.4* 0.7 0.8  PROT 6.9 6.4* 7.8  ALBUMIN 3.8 3.5 4.6    Recent Labs  06/01/15 2344 02/27/16 1643  LIPASE 26 16   No results for input(s): AMMONIA in the last 8760 hours. CBC:  Recent Labs  12/16/15 1738 02/27/16 1643 02/29/16 0503 03/19/16 1132  WBC 14.7* 10.6* 9.3 8.4  NEUTROABS 11.8* 6.4  --  4.8  HGB 12.9* 11.5* 11.1* 15.2  HCT 40.8 36.0* 34.6* 45.3  MCV 88.7 84.9 85.2 85.2  PLT 200 177 160 245   TSH:  Recent Labs  06/05/15 0441  TSH 1.326   A1C: Lab Results  Component Value Date   HGBA1C 7.3 (H) 08/27/2015   Lipid Panel:  Recent Labs  06/05/15 0441 08/27/15 1608  CHOL 162 125  HDL 46 49.60  LDLCALC 95 37  TRIG 106 194.0*  CHOLHDL 3.5 3    Radiological Exams: Dg Chest 2 View  Result Date: 03/19/2016 CLINICAL DATA:  Weakness this morning. EXAM: CHEST  2 VIEW COMPARISON:  PA and lateral chest 02/28/2016. FINDINGS: The lungs are clear. Heart size is normal. Hiatal hernia is again seen. Aortic atherosclerosis is noted. No acute bony abnormality. IMPRESSION: No acute disease. Atherosclerosis. Hiatal hernia. Electronically Signed   By: Inge Rise M.D.   On: 03/19/2016 12:27    Assessment/Plan 1. Hx OF CVA Stable, gait back to baseline.   2. Moderate dementia with behavioral disturbance Advanced dementia, conts on aricept. Wife is caregiver at home. conts to [facility] at [facility].   3. Essential hypertension Blood pressure stable will cont current regimen.   4. GERD without esophagitis conts on omeprazole 20 mg  BID, will dc protonix as this is duplicate therapy and appears he was on omeprazole at home.   5. Type 2 diabetes mellitus with stage 2 chronic kidney disease, unspecified long term insulin use status (HCC) conts on metformin 500 mg twice daily.   6. Physical deconditioning Has improved and  now back to baseline. Will be discharged home with home health services.    pt is stable for discharge-will need PT/OT per home health. DME needed includes semi electric hospital bed, WC. Does not need RX per wife as she has already received through the [place]  will need to follow up with PCP within 2 weeks.     Carlos American. Harle Battiest  [facility] [phone] 8 am - 5 pm) [phone] (after hours)

## 2016-04-13 ENCOUNTER — Ambulatory Visit: Payer: PPO | Admitting: Internal Medicine

## 2016-04-18 ENCOUNTER — Ambulatory Visit (INDEPENDENT_AMBULATORY_CARE_PROVIDER_SITE_OTHER): Payer: PPO | Admitting: Internal Medicine

## 2016-04-18 ENCOUNTER — Encounter: Payer: Self-pay | Admitting: Internal Medicine

## 2016-04-18 DIAGNOSIS — R531 Weakness: Secondary | ICD-10-CM

## 2016-04-18 DIAGNOSIS — F03B18 Unspecified dementia, moderate, with other behavioral disturbance: Secondary | ICD-10-CM

## 2016-04-18 DIAGNOSIS — I10 Essential (primary) hypertension: Secondary | ICD-10-CM

## 2016-04-18 DIAGNOSIS — F0391 Unspecified dementia with behavioral disturbance: Secondary | ICD-10-CM

## 2016-04-18 DIAGNOSIS — E118 Type 2 diabetes mellitus with unspecified complications: Secondary | ICD-10-CM

## 2016-04-18 NOTE — Patient Instructions (Signed)
We are not making any changes today.    

## 2016-04-18 NOTE — Progress Notes (Signed)
   Subjective:    Patient ID: Chad [name], male    DOB: 09-06-1929, 83 y.o.   MRN: [id number]  HPI The patient is an 83 YO man with severe dementia coming in for rehab follow up (in for diffuse weakness with PT/OT/ST and they are currently coming to the house). He is accompanied by his wife who provides most of the history. He is doing well since being home. No falls. No chest pains or complaints. Eating and drinking well. Hearing fine as well. Sugars are running well. No new concerns. Taking his meds without problems.   PMH, [facility], social history reviewed and updated.   Review of Systems  Unable to perform ROS: Dementia      Objective:   Physical Exam  Constitutional: He appears well-developed and well-nourished.  HENT:  Head: Normocephalic and atraumatic.  Eyes: EOM are normal.  Neck: Normal range of motion.  Cardiovascular: Normal rate and regular rhythm.   Pulmonary/Chest: Effort normal and breath sounds normal. No respiratory distress. He has no wheezes.  Abdominal: Soft. Bowel sounds are normal. He exhibits no distension. There is no tenderness. There is no rebound and no guarding.  Musculoskeletal: He exhibits no edema.  Neurological: He is alert. Coordination abnormal.  Wheelchair for long distances, walker at home.   Skin: Skin is warm and dry.   Vitals:   04/18/16 1442  BP: 128/60  Pulse: 73  Resp: 14  Temp: 98.4 F (36.9 C)  TempSrc: Oral  SpO2: 98%  Weight: 160 lb 5 oz (72.7 kg)  Height: 5\' 9"  (1.753 m)      Assessment & Plan:

## 2016-04-18 NOTE — Progress Notes (Signed)
Pre visit review using our clinic review tool, if applicable. No additional management support is needed unless otherwise documented below in the visit note. 

## 2016-04-19 ENCOUNTER — Encounter: Payer: Self-pay | Admitting: Internal Medicine

## 2016-04-19 ENCOUNTER — Telehealth: Payer: Self-pay | Admitting: Internal Medicine

## 2016-04-19 NOTE — Telephone Encounter (Signed)
Contacted Jenny Reichmann , stated awareness

## 2016-04-19 NOTE — Assessment & Plan Note (Signed)
No low sugars and last HgA1c at goal on his metformin.

## 2016-04-19 NOTE — Assessment & Plan Note (Signed)
Stable and on aricpet and will continue. He is having some declines over the last 6 months and will continue to monitor. His wife has gotten some respite care from the [place] which will be helpful.

## 2016-04-19 NOTE — Assessment & Plan Note (Signed)
Improving gradually with PT/OT/ST. Continue for now.

## 2016-04-19 NOTE — Telephone Encounter (Signed)
Cindy from [facility] called to get the okay for the resumption of PT post hospitalization. She said that they already have the [place] authorization. Please advise.

## 2016-04-19 NOTE — Assessment & Plan Note (Signed)
BP at goal on his current amlodipine, clonidine (prn only), lasix, losartan. No changes needed today and labs reviewed from recent [place] encounter.

## 2016-04-19 NOTE — Telephone Encounter (Signed)
Okay to give. 

## 2016-04-23 ENCOUNTER — Other Ambulatory Visit: Payer: Self-pay | Admitting: Internal Medicine

## 2016-05-02 ENCOUNTER — Telehealth: Payer: Self-pay

## 2016-05-02 ENCOUNTER — Ambulatory Visit: Payer: PPO | Admitting: Internal Medicine

## 2016-05-02 MED ORDER — SIMVASTATIN 20 MG PO TABS: ORAL_TABLET | ORAL | 1 refills | Status: AC

## 2016-05-02 NOTE — Telephone Encounter (Signed)
Med refill

## 2016-05-20 ENCOUNTER — Inpatient Hospital Stay (HOSPITAL_COMMUNITY)
Admission: EM | Admit: 2016-05-20 | Discharge: 2016-05-27 | DRG: 640 | Disposition: A | Discharge status: Home Health | Payer: PPO | Attending: Internal Medicine | Admitting: Internal Medicine

## 2016-05-20 ENCOUNTER — Emergency Department (HOSPITAL_COMMUNITY): Payer: PPO

## 2016-05-20 ENCOUNTER — Encounter (HOSPITAL_COMMUNITY): Payer: Self-pay | Admitting: *Deleted

## 2016-05-20 DIAGNOSIS — I110 Hypertensive heart disease with heart failure: Secondary | ICD-10-CM | POA: Diagnosis present

## 2016-05-20 DIAGNOSIS — I5032 Chronic diastolic (congestive) heart failure: Secondary | ICD-10-CM

## 2016-05-20 DIAGNOSIS — Z8673 Personal history of transient ischemic attack (TIA), and cerebral infarction without residual deficits: Secondary | ICD-10-CM

## 2016-05-20 DIAGNOSIS — I69319 Unspecified symptoms and signs involving cognitive functions following cerebral infarction: Secondary | ICD-10-CM

## 2016-05-20 DIAGNOSIS — W19XXXA Unspecified fall, initial encounter: Secondary | ICD-10-CM | POA: Diagnosis not present

## 2016-05-20 DIAGNOSIS — K2270 Barrett's esophagus without dysplasia: Secondary | ICD-10-CM | POA: Diagnosis present

## 2016-05-20 DIAGNOSIS — Z87891 Personal history of nicotine dependence: Secondary | ICD-10-CM

## 2016-05-20 DIAGNOSIS — Z7984 Long term (current) use of oral hypoglycemic drugs: Secondary | ICD-10-CM

## 2016-05-20 DIAGNOSIS — I69354 Hemiplegia and hemiparesis following cerebral infarction affecting left non-dominant side: Secondary | ICD-10-CM | POA: Diagnosis not present

## 2016-05-20 DIAGNOSIS — E871 Hypo-osmolality and hyponatremia: Secondary | ICD-10-CM | POA: Diagnosis not present

## 2016-05-20 DIAGNOSIS — R531 Weakness: Secondary | ICD-10-CM | POA: Diagnosis not present

## 2016-05-20 DIAGNOSIS — T465X5A Adverse effect of other antihypertensive drugs, initial encounter: Secondary | ICD-10-CM | POA: Diagnosis present

## 2016-05-20 DIAGNOSIS — S32000A Wedge compression fracture of unspecified lumbar vertebra, initial encounter for closed fracture: Secondary | ICD-10-CM

## 2016-05-20 DIAGNOSIS — T501X5A Adverse effect of loop [high-ceiling] diuretics, initial encounter: Secondary | ICD-10-CM | POA: Diagnosis present

## 2016-05-20 DIAGNOSIS — Y92009 Unspecified place in unspecified non-institutional (private) residence as the place of occurrence of the external cause: Secondary | ICD-10-CM

## 2016-05-20 DIAGNOSIS — K219 Gastro-esophageal reflux disease without esophagitis: Secondary | ICD-10-CM | POA: Diagnosis not present

## 2016-05-20 DIAGNOSIS — R0600 Dyspnea, unspecified: Secondary | ICD-10-CM | POA: Diagnosis not present

## 2016-05-20 DIAGNOSIS — Z66 Do not resuscitate: Secondary | ICD-10-CM | POA: Diagnosis present

## 2016-05-20 DIAGNOSIS — S32010A Wedge compression fracture of first lumbar vertebra, initial encounter for closed fracture: Secondary | ICD-10-CM

## 2016-05-20 DIAGNOSIS — I10 Essential (primary) hypertension: Secondary | ICD-10-CM

## 2016-05-20 DIAGNOSIS — Z888 Allergy status to other drugs, medicaments and biological substances status: Secondary | ICD-10-CM

## 2016-05-20 DIAGNOSIS — F32A Depression, unspecified: Secondary | ICD-10-CM | POA: Diagnosis present

## 2016-05-20 DIAGNOSIS — T43225A Adverse effect of selective serotonin reuptake inhibitors, initial encounter: Secondary | ICD-10-CM | POA: Diagnosis not present

## 2016-05-20 DIAGNOSIS — R4182 Altered mental status, unspecified: Secondary | ICD-10-CM

## 2016-05-20 DIAGNOSIS — F0390 Unspecified dementia without behavioral disturbance: Secondary | ICD-10-CM | POA: Diagnosis not present

## 2016-05-20 DIAGNOSIS — Z85828 Personal history of other malignant neoplasm of skin: Secondary | ICD-10-CM

## 2016-05-20 DIAGNOSIS — J449 Chronic obstructive pulmonary disease, unspecified: Secondary | ICD-10-CM

## 2016-05-20 DIAGNOSIS — E222 Syndrome of inappropriate secretion of antidiuretic hormone: Secondary | ICD-10-CM

## 2016-05-20 DIAGNOSIS — I2510 Atherosclerotic heart disease of native coronary artery without angina pectoris: Secondary | ICD-10-CM | POA: Diagnosis not present

## 2016-05-20 DIAGNOSIS — H409 Unspecified glaucoma: Secondary | ICD-10-CM | POA: Diagnosis not present

## 2016-05-20 DIAGNOSIS — E785 Hyperlipidemia, unspecified: Secondary | ICD-10-CM | POA: Diagnosis present

## 2016-05-20 DIAGNOSIS — R0602 Shortness of breath: Secondary | ICD-10-CM | POA: Diagnosis not present

## 2016-05-20 DIAGNOSIS — E860 Dehydration: Secondary | ICD-10-CM | POA: Diagnosis present

## 2016-05-20 DIAGNOSIS — Z882 Allergy status to sulfonamides status: Secondary | ICD-10-CM

## 2016-05-20 DIAGNOSIS — S32010G Wedge compression fracture of first lumbar vertebra, subsequent encounter for fracture with delayed healing: Secondary | ICD-10-CM | POA: Diagnosis not present

## 2016-05-20 DIAGNOSIS — Z881 Allergy status to other antibiotic agents status: Secondary | ICD-10-CM

## 2016-05-20 DIAGNOSIS — S299XXA Unspecified injury of thorax, initial encounter: Secondary | ICD-10-CM | POA: Diagnosis not present

## 2016-05-20 DIAGNOSIS — E118 Type 2 diabetes mellitus with unspecified complications: Secondary | ICD-10-CM | POA: Diagnosis not present

## 2016-05-20 DIAGNOSIS — K5730 Diverticulosis of large intestine without perforation or abscess without bleeding: Secondary | ICD-10-CM | POA: Diagnosis not present

## 2016-05-20 DIAGNOSIS — T148XXA Other injury of unspecified body region, initial encounter: Secondary | ICD-10-CM | POA: Diagnosis not present

## 2016-05-20 DIAGNOSIS — R079 Chest pain, unspecified: Secondary | ICD-10-CM | POA: Diagnosis not present

## 2016-05-20 DIAGNOSIS — E861 Hypovolemia: Secondary | ICD-10-CM | POA: Diagnosis present

## 2016-05-20 DIAGNOSIS — M25551 Pain in right hip: Secondary | ICD-10-CM | POA: Diagnosis not present

## 2016-05-20 DIAGNOSIS — S32019A Unspecified fracture of first lumbar vertebra, initial encounter for closed fracture: Secondary | ICD-10-CM | POA: Diagnosis present

## 2016-05-20 DIAGNOSIS — K22719 Barrett's esophagus with dysplasia, unspecified: Secondary | ICD-10-CM

## 2016-05-20 DIAGNOSIS — L8990 Pressure ulcer of unspecified site, unspecified stage: Secondary | ICD-10-CM | POA: Diagnosis not present

## 2016-05-20 DIAGNOSIS — R918 Other nonspecific abnormal finding of lung field: Secondary | ICD-10-CM | POA: Diagnosis not present

## 2016-05-20 DIAGNOSIS — Z79899 Other long term (current) drug therapy: Secondary | ICD-10-CM

## 2016-05-20 DIAGNOSIS — D649 Anemia, unspecified: Secondary | ICD-10-CM | POA: Diagnosis not present

## 2016-05-20 DIAGNOSIS — S22010A Wedge compression fracture of first thoracic vertebra, initial encounter for closed fracture: Secondary | ICD-10-CM

## 2016-05-20 DIAGNOSIS — M6281 Muscle weakness (generalized): Secondary | ICD-10-CM | POA: Diagnosis not present

## 2016-05-20 DIAGNOSIS — J189 Pneumonia, unspecified organism: Secondary | ICD-10-CM | POA: Diagnosis present

## 2016-05-20 DIAGNOSIS — F329 Major depressive disorder, single episode, unspecified: Secondary | ICD-10-CM | POA: Diagnosis present

## 2016-05-20 DIAGNOSIS — S3992XA Unspecified injury of lower back, initial encounter: Secondary | ICD-10-CM | POA: Diagnosis not present

## 2016-05-20 DIAGNOSIS — R935 Abnormal findings on diagnostic imaging of other abdominal regions, including retroperitoneum: Secondary | ICD-10-CM | POA: Diagnosis not present

## 2016-05-20 DIAGNOSIS — I700 Atherosclerosis of aorta: Secondary | ICD-10-CM | POA: Diagnosis not present

## 2016-05-20 LAB — COMPREHENSIVE METABOLIC PANEL
ALT: 22 U/L (ref 17–63)
AST: 24 U/L (ref 15–41)
Albumin: 3.8 g/dL (ref 3.5–5.0)
Alkaline Phosphatase: 76 U/L (ref 38–126)
Anion gap: 11 (ref 5–15)
BUN: 25 mg/dL — ABNORMAL HIGH (ref 6–20)
CO2: 25 mmol/L (ref 22–32)
Calcium: 10 mg/dL (ref 8.9–10.3)
Chloride: 91 mmol/L — ABNORMAL LOW (ref 101–111)
Creatinine, Ser: 1.47 mg/dL — ABNORMAL HIGH (ref 0.61–1.24)
GFR calc Af Amer: 49 mL/min — ABNORMAL LOW (ref >60)
GFR calc non Af Amer: 42 mL/min — ABNORMAL LOW (ref >60)
Glucose, Bld: 157 mg/dL — ABNORMAL HIGH (ref 65–99)
Potassium: 4.7 mmol/L (ref 3.5–5.1)
Sodium: 127 mmol/L — ABNORMAL LOW (ref 135–145)
Total Bilirubin: 0.5 mg/dL (ref 0.3–1.2)
Total Protein: 6.5 g/dL (ref 6.5–8.1)

## 2016-05-20 LAB — CBC WITH DIFFERENTIAL/PLATELET
Basophils Absolute: 0 10*3/uL (ref 0.0–0.1)
Basophils Relative: 0 %
Eosinophils Absolute: 0.2 10*3/uL (ref 0.0–0.7)
Eosinophils Relative: 2 %
HCT: 37.3 % — ABNORMAL LOW (ref 39.0–52.0)
Hemoglobin: 12.3 g/dL — ABNORMAL LOW (ref 13.0–17.0)
Lymphocytes Relative: 10 %
Lymphs Abs: 1.1 10*3/uL (ref 0.7–4.0)
MCH: 27.9 pg (ref 26.0–34.0)
MCHC: 33 g/dL (ref 30.0–36.0)
MCV: 84.6 fL (ref 78.0–100.0)
Monocytes Absolute: 1.2 10*3/uL — ABNORMAL HIGH (ref 0.1–1.0)
Monocytes Relative: 12 %
Neutro Abs: 8.2 10*3/uL — ABNORMAL HIGH (ref 1.7–7.7)
Neutrophils Relative %: 76 %
Platelets: 210 10*3/uL (ref 150–400)
RBC: 4.41 MIL/uL (ref 4.22–5.81)
RDW: 15.5 % (ref 11.5–15.5)
WBC: 10.8 10*3/uL — ABNORMAL HIGH (ref 4.0–10.5)

## 2016-05-20 LAB — URINALYSIS, ROUTINE W REFLEX MICROSCOPIC
Bilirubin Urine: NEGATIVE
Glucose, UA: NEGATIVE mg/dL
Hgb urine dipstick: NEGATIVE
Ketones, ur: NEGATIVE mg/dL
Leukocytes, UA: NEGATIVE
Nitrite: NEGATIVE
Protein, ur: NEGATIVE mg/dL
Specific Gravity, Urine: 1.011 (ref 1.005–1.030)
pH: 5 (ref 5.0–8.0)

## 2016-05-20 LAB — GLUCOSE, CAPILLARY: GLUCOSE-CAPILLARY: 130 mg/dL — AB (ref 65–99)

## 2016-05-20 MED ORDER — ENOXAPARIN SODIUM 40 MG/0.4ML ~~LOC~~ SOLN
40.0000 mg | SUBCUTANEOUS | Status: DC
  Administered 2016-05-20 – 2016-05-21 (×2): 40 mg via SUBCUTANEOUS
  Filled 2016-05-20 (×2): qty 0.4

## 2016-05-20 MED ORDER — MOMETASONE FURO-FORMOTEROL FUM 200-5 MCG/ACT IN AERO
2.0000 | INHALATION_SPRAY | Freq: Two times a day (BID) | RESPIRATORY_TRACT | Status: DC
  Administered 2016-05-21 – 2016-05-27 (×11): 2 via RESPIRATORY_TRACT
  Filled 2016-05-20: qty 8.8

## 2016-05-20 MED ORDER — VITAMIN B-12 1000 MCG PO TABS
1000.0000 ug | ORAL_TABLET | Freq: Every day | ORAL | Status: DC
  Administered 2016-05-21 – 2016-05-27 (×7): 1000 ug via ORAL
  Filled 2016-05-20 (×7): qty 1

## 2016-05-20 MED ORDER — POLYETHYLENE GLYCOL 3350 17 G PO PACK: 17.0000 g | PACK | Freq: Every day | ORAL | Status: DC | PRN

## 2016-05-20 MED ORDER — HYDROCODONE-ACETAMINOPHEN 5-325 MG PO TABS
1.0000 | ORAL_TABLET | Freq: Once | ORAL | Status: AC
  Administered 2016-05-20: 1 via ORAL
  Filled 2016-05-20: qty 1

## 2016-05-20 MED ORDER — SODIUM CHLORIDE 0.9 % IV BOLUS (SEPSIS)
1000.0000 mL | Freq: Once | INTRAVENOUS | Status: AC
  Administered 2016-05-20: 1000 mL via INTRAVENOUS

## 2016-05-20 MED ORDER — BISACODYL 5 MG PO TBEC: 5.0000 mg | DELAYED_RELEASE_TABLET | Freq: Every day | ORAL | Status: DC | PRN

## 2016-05-20 MED ORDER — SODIUM CHLORIDE 0.9% FLUSH
3.0000 mL | Freq: Two times a day (BID) | INTRAVENOUS | Status: DC
  Administered 2016-05-20 – 2016-05-26 (×11): 3 mL via INTRAVENOUS

## 2016-05-20 MED ORDER — HYDROCODONE-ACETAMINOPHEN 5-325 MG PO TABS: 1.0000 | ORAL_TABLET | ORAL | Status: DC | PRN

## 2016-05-20 MED ORDER — DONEPEZIL HCL 10 MG PO TABS
10.0000 mg | ORAL_TABLET | Freq: Every day | ORAL | Status: DC
  Administered 2016-05-20 – 2016-05-26 (×7): 10 mg via ORAL
  Filled 2016-05-20 (×7): qty 1

## 2016-05-20 MED ORDER — INSULIN ASPART 100 UNIT/ML ~~LOC~~ SOLN
0.0000 [IU] | Freq: Three times a day (TID) | SUBCUTANEOUS | Status: DC
  Administered 2016-05-21 – 2016-05-22 (×4): 1 [IU] via SUBCUTANEOUS
  Administered 2016-05-22 – 2016-05-24 (×2): 2 [IU] via SUBCUTANEOUS
  Administered 2016-05-24 (×2): 1 [IU] via SUBCUTANEOUS
  Administered 2016-05-25: 2 [IU] via SUBCUTANEOUS
  Administered 2016-05-25 – 2016-05-26 (×2): 1 [IU] via SUBCUTANEOUS
  Administered 2016-05-26: 3 [IU] via SUBCUTANEOUS
  Administered 2016-05-27: 1 [IU] via SUBCUTANEOUS

## 2016-05-20 MED ORDER — INSULIN ASPART 100 UNIT/ML ~~LOC~~ SOLN: 0.0000 [IU] | Freq: Every day | SUBCUTANEOUS | Status: DC

## 2016-05-20 MED ORDER — ALBUTEROL SULFATE (2.5 MG/3ML) 0.083% IN NEBU: 2.5000 mg | INHALATION_SOLUTION | RESPIRATORY_TRACT | Status: DC | PRN

## 2016-05-20 MED ORDER — CLONIDINE HCL 0.1 MG PO TABS
0.1000 mg | ORAL_TABLET | Freq: Every day | ORAL | Status: DC
  Administered 2016-05-21 – 2016-05-27 (×7): 0.1 mg via ORAL
  Filled 2016-05-20 (×7): qty 1

## 2016-05-20 MED ORDER — PANTOPRAZOLE SODIUM 40 MG PO TBEC
40.0000 mg | DELAYED_RELEASE_TABLET | Freq: Two times a day (BID) | ORAL | Status: DC
  Administered 2016-05-21 – 2016-05-27 (×13): 40 mg via ORAL
  Filled 2016-05-20 (×13): qty 1

## 2016-05-20 MED ORDER — ONDANSETRON HCL 4 MG/2ML IJ SOLN
4.0000 mg | Freq: Four times a day (QID) | INTRAMUSCULAR | Status: DC | PRN
  Filled 2016-05-20: qty 2

## 2016-05-20 MED ORDER — CLOPIDOGREL BISULFATE 75 MG PO TABS
75.0000 mg | ORAL_TABLET | Freq: Every day | ORAL | Status: DC
  Administered 2016-05-21 – 2016-05-23 (×3): 75 mg via ORAL
  Filled 2016-05-20 (×3): qty 1

## 2016-05-20 MED ORDER — AMLODIPINE BESYLATE 10 MG PO TABS
10.0000 mg | ORAL_TABLET | Freq: Every day | ORAL | Status: DC
  Administered 2016-05-21 – 2016-05-27 (×7): 10 mg via ORAL
  Filled 2016-05-20 (×7): qty 1

## 2016-05-20 MED ORDER — ACETAMINOPHEN 325 MG PO TABS
650.0000 mg | ORAL_TABLET | Freq: Four times a day (QID) | ORAL | Status: DC | PRN
  Administered 2016-05-24 – 2016-05-26 (×3): 650 mg via ORAL
  Filled 2016-05-20 (×4): qty 2

## 2016-05-20 MED ORDER — LOSARTAN POTASSIUM 50 MG PO TABS
100.0000 mg | ORAL_TABLET | Freq: Every day | ORAL | Status: DC
  Filled 2016-05-20: qty 2

## 2016-05-20 MED ORDER — LATANOPROST 0.005 % OP SOLN
1.0000 [drp] | Freq: Two times a day (BID) | OPHTHALMIC | Status: DC
  Administered 2016-05-21 – 2016-05-27 (×13): 1 [drp] via OPHTHALMIC
  Filled 2016-05-20: qty 2.5

## 2016-05-20 MED ORDER — SIMVASTATIN 20 MG PO TABS
20.0000 mg | ORAL_TABLET | Freq: Every day | ORAL | Status: DC
  Administered 2016-05-20 – 2016-05-26 (×7): 20 mg via ORAL
  Filled 2016-05-20 (×7): qty 1

## 2016-05-20 MED ORDER — VITAMIN D 1000 UNITS PO TABS
1000.0000 [IU] | ORAL_TABLET | Freq: Every day | ORAL | Status: DC
  Administered 2016-05-21 – 2016-05-27 (×7): 1000 [IU] via ORAL
  Filled 2016-05-20 (×7): qty 1

## 2016-05-20 MED ORDER — LORAZEPAM 1 MG PO TABS
0.5000 mg | ORAL_TABLET | Freq: Every day | ORAL | Status: DC
  Administered 2016-05-23 – 2016-05-24 (×2): 0.5 mg via ORAL
  Filled 2016-05-20 (×4): qty 1

## 2016-05-20 MED ORDER — SODIUM CHLORIDE 0.9 % IV SOLN
INTRAVENOUS | Status: AC
  Administered 2016-05-20 – 2016-05-21 (×2): via INTRAVENOUS

## 2016-05-20 MED ORDER — ACETAMINOPHEN 650 MG RE SUPP: 650.0000 mg | Freq: Four times a day (QID) | RECTAL | Status: DC | PRN

## 2016-05-20 MED ORDER — ONDANSETRON HCL 4 MG PO TABS: 4.0000 mg | ORAL_TABLET | Freq: Four times a day (QID) | ORAL | Status: DC | PRN

## 2016-05-20 MED ORDER — PRESERVISION AREDS 2 PO CAPS: 1.0000 | ORAL_CAPSULE | Freq: Two times a day (BID) | ORAL | Status: DC

## 2016-05-20 NOTE — ED Notes (Signed)
Pt placed on transport monitor awaiting to be transported up stairs.

## 2016-05-20 NOTE — H&P (Signed)
History and Physical    NICKOLIS DIEL ZMO:[id number] DOB: 1929-05-01 DOA: 05/20/2016  PCP: Hoyt Koch, MD   Patient coming from: Home  Chief Complaint: Generalized weakness, fall at home with low back pain   HPI: Chad [name] is a 83 y.o. male with medical history significant for type 2 diabetes mellitus, COPD, hypertension, Barrett esophagus, chronic diastolic CHF, history of CVA with mild left-sided weakness, and dementia who presents to the ED with generalized weakness and a fall at home. Patient is accompanied by his wife who assists with the history. He had reportedly been in his usual state until noting the insidious development of generalized weakness over the past 1-2 weeks. Over the past couple days, his wife has had increasing difficulty helping him to get up from bed, where he had recently been able to do this on his own. She is now having to use all of her strength to try to pull the patient up to a standing position. Just prior to arrival, the patient was standing with his walker, attempting to transfer to a chair, when he fell backwards onto his bottom, with no loss of consciousness or head strike. Patient reported pain at the low back following this. He has complained of mild nausea the past couple days and has chronic loose stools, but there has not been any vomiting or diarrhea. His appetite is largely unchanged. Patient denies headaches, change in vision or hearing, or any new focal numbness or weakness. He denies recent fevers or chills, denies significant cough or dyspnea, and denies abdominal pain or dysuria.  ED Course: Upon arrival to the ED, patient is found to be afebrile, saturating well on room air, and with vital signs stable. EKG features a sinus rhythm with PAC and chest x-ray is negative for acute cardiopulmonary disease, but notable for an age-indeterminate compression fracture of the L1 vertebral body. Chemistry panels notable for a sodium of 127,  previously normal, and serum creatinine 1.47, slightly up from an apparent baseline of 1.25. CBC features a very mild leukocytosis to 10,800 and a normocytic anemia with hemoglobin 12.3. Urinalysis is unremarkable. Patient was treated with 5 mg Norco in the ED and given a 1 L normal saline bolus. He has remained hemodynamically stable and not in any apparent respiratory distress, and he will be admitted to the telemetry unit for ongoing evaluation and management of generalized weakness with fall, suspected secondary to hyponatremia.  Review of Systems:  All other systems reviewed and apart from HPI, are negative.  Past Medical History:  Diagnosis Date  . Acid reflux disease   . Arthritis    "all over"  . Arthritis   . Barrett's esophagus   . Basal cell carcinoma of nose   . CAROTID ARTERY STENOSIS, BILATERAL 08/27/2008  . Chronic bronchitis ([place])    "most q yr; he's had it several times" (12/20/2013)  . COPD (chronic obstructive pulmonary disease) ([address])    "severe" (12/20/2013)  . COPD (chronic obstructive pulmonary disease) ([place])   . Coronary artery stenosis   . Diabetes mellitus without complication ([place])   . Diaphragmatic hernia without mention of obstruction or gangrene   . Diverticulitis   . Diverticulosis of colon   . Dyspnea   . Esophageal stenosis   . Fatty liver disease, nonalcoholic   . GERD (gastroesophageal reflux disease)   . Glaucoma   . Glaucoma   . High cholesterol   . History of blood transfusion 2007   "related to OR"  .  History of hiatal hernia    "repaired when esophagus removed"  . History of stomach ulcers   . HTN (hypertension)   . Hyperlipidemia   . Hypertension   . Kidney stones    "passed them all" (12/20/2013)  . Memory loss    DEMENTIA  . Personal history of other malignant neoplasm of skin   . Pneumonia 1990's X 4  . RENAL CALCULUS, HX OF 08/28/2008  . Renal disorder   . Sinus headache    "seasonal"  . Stroke ([place])   . TIA (transient ischemic  attack) 1998 X "a few"  . Type II diabetes mellitus ([place])     Past Surgical History:  Procedure Laterality Date  . BASAL CELL CARCINOMA EXCISION    . CAROTID ARTERY ANGIOPLASTY    . CAROTID ENDARTERECTOMY Bilateral 02/1996-03/1996  . CATARACT EXTRACTION W/ INTRAOCULAR LENS IMPLANT Bilateral 04/2007 - 09/2007   "left-right"  . ESOPHAGECTOMY  2007   with stomach pull through  . ESOPHAGOGASTRODUODENOSCOPY (EGD) WITH ESOPHAGEAL DILATION  10/2011  . EYE SURGERY Right 04/2006; 08/2007   "had gas bubbles put in"  . EYE SURGERY Right 01/2007   "air bubble"  . EYE SURGERY Left 12/12/2013   "cleaned his implant w/laser"  . EYE SURGERY    . GLAUCOMA SURGERY Right 11/2008   "implanted drain tube"  . HERNIA REPAIR    . KNEE ARTHROSCOPY Bilateral 04/1989; 08/1996; 06/1999   "right; left; left"  . KNEE SURGERY    . MOHS SURGERY  ?2002   "tip of nose; basal cell"  . NASAL POLYP EXCISION  2005   "benign"  . PARS PLANA VITRECTOMY Bilateral 01/2007-05/2007  . SHOULDER ARTHROSCOPY Right 04/1998  . SHOULDER SURGERY    . VENTRAL HERNIA REPAIR  02/2006     reports that he quit smoking about 38 years ago. His smoking use included Cigarettes. He has a 40.00 pack-year smoking history. He has never used smokeless tobacco. He reports that he does not drink alcohol or use drugs.  Allergies  Allergen Reactions  . Moxifloxacin Anaphylaxis    Avelox REACTION: anaphylaxis  . Quinolones Anaphylaxis  . Sulfonamide Derivatives Hives  . Timolol Maleate Other (See Comments)    Causes severe chest congestion  . Amoxicillin Other (See Comments)    Rash, sweating, sob Has patient had a PCN reaction causing immediate rash, facial/tongue/throat swelling, SOB or lightheadedness with hypotension: Yes Has patient had a PCN reaction causing severe rash involving mucus membranes or skin necrosis: Yes Has patient had a PCN reaction that required hospitalization Yes Has patient had a PCN reaction occurring within the last 10  years: Yes If all of the above answers are "NO", then may proceed with Cephalosporin use.   . Augmentin [Amoxicillin-Pot Clavulanate] Other (See Comments)    Anaphylaxis  Has patient had a PCN reaction causing immediate rash, facial/tongue/throat swelling, SOB or lightheadedness with hypotension: Yes Has patient had a PCN reaction causing severe rash involving mucus membranes or skin necrosis: Yes Has patient had a PCN reaction that required hospitalization Yes Has patient had a PCN reaction occurring within the last 10 years: Yes If all of the above answers are "NO", then may proceed with Cephalosporin use.   . Sulfa Antibiotics Rash    Family History  Problem Relation Age of Onset  . Colon cancer Mother   . Lung cancer Mother   . Heart attack Mother   . Stomach cancer Father   . Esophageal cancer Father   .  Colon cancer Maternal Grandfather   . Heart attack Brother   . Heart attack Brother      Prior to Admission medications   Medication Sig Start Date End Date Taking? Authorizing Provider  ACETAMINOPHEN PO Take 650 mg by mouth every 6 (six) hours as needed for fever.    Yes Historical Provider, MD  albuterol (PROVENTIL HFA;VENTOLIN HFA) 108 (90 Base) MCG/ACT inhaler Inhale 2 puffs into the lungs every 4 (four) hours as needed for wheezing or shortness of breath.   Yes Historical Provider, MD  albuterol (PROVENTIL) (2.5 MG/3ML) 0.083% nebulizer solution Take 2.5 mg by nebulization every 4 (four) hours as needed for wheezing or shortness of breath.   Yes Historical Provider, MD  amLODipine (NORVASC) 10 MG tablet TAKE 1 TABLET(10 MG) BY MOUTH DAILY 03/23/16  Yes Hoyt Koch, MD  budesonide-formoterol [facility]) 160-4.5 MCG/ACT inhaler Inhale 2 puffs into the lungs 2 (two) times daily.   Yes Historical Provider, MD  Cholecalciferol (VITAMIN D) 1000 UNITS capsule Take 1,000 Units by mouth daily.     Yes Historical Provider, MD  cloNIDine (CATAPRES) 0.2 MG tablet TAKE  ONE-HALF TABLET BY MOUTH DAILY AS NEEDED ONLY IF SYSTOLIC IS OVER 673 04/25/91  Yes Hoyt Koch, MD  clopidogrel (PLAVIX) 75 MG tablet TAKE 1 TABLET BY MOUTH EVERY DAY 08/20/15  Yes Rosalin Hawking, MD  donepezil (ARICEPT) 10 MG tablet Take 1 tablet (10 mg total) by mouth at bedtime. 02/26/16  Yes Cameron Sprang, MD  furosemide (LASIX) 20 MG tablet Take 20 mg by mouth daily.   Yes Historical Provider, MD  latanoprost (XALATAN) 0.005 % ophthalmic solution Place 1 drop into the left eye 2 (two) times daily.   Yes Historical Provider, MD  losartan (COZAAR) 100 MG tablet Take 1 tablet (100 mg total) by mouth daily. 01/28/16  Yes Hoyt Koch, MD  metFORMIN (GLUCOPHAGE) 500 MG tablet Take 1 tablet (500 mg total) by mouth 2 (two) times daily with a meal. 02/01/16  Yes Hoyt Koch, MD  Multiple Vitamins-Minerals (PRESERVISION AREDS 2) CAPS Take 1 capsule by mouth 2 (two) times daily.   Yes Historical Provider, MD  omeprazole (PRILOSEC) 20 MG capsule Take 20 mg by mouth 2 (two) times daily before a meal.    Yes Historical Provider, MD  omeprazole (PRILOSEC) 20 MG capsule Take 20 mg by mouth 2 (two) times daily before a meal.   Yes Historical Provider, MD  PARoxetine (PAXIL) 20 MG tablet Take 1 tablet (20 mg total) by mouth daily. 03/07/16  Yes Hoyt Koch, MD  potassium chloride SA (K-DUR,KLOR-CON) 20 MEQ tablet Take 1 tablet (20 mEq total) by mouth daily. 01/27/16  Yes Hoyt Koch, MD  promethazine (PHENERGAN) 25 MG tablet Take 12.5 mg by mouth every 8 (eight) hours as needed for nausea or vomiting.    Yes Historical Provider, MD  simvastatin (ZOCOR) 20 MG tablet TAKE 1 TABLET BY MOUTH DAILY AT 6 PM 05/02/16  Yes Hoyt Koch, MD  vitamin B-12 (CYANOCOBALAMIN) 1000 MCG tablet Take 1,000 mcg by mouth daily.   Yes Historical Provider, MD  LORazepam (ATIVAN) 0.5 MG tablet Take 1 tablet (0.5 mg total) by mouth at bedtime. 02/01/16   Hoyt Koch, MD    Physical  Exam: Vitals:   05/20/16 2030 05/20/16 2045 05/20/16 2100 05/20/16 2115  BP: (!) 149/63 (!) 144/67 (!) 157/73 (!) 152/70  Pulse: 73 77 79 81  Resp: 20 20 20 19   Temp:  TempSrc:      SpO2: 94% 93% 92% 94%      Constitutional: No respiratory distress, appears uncomfortable.  Eyes: PERTLA, lids and conjunctivae normal ENMT: Mucous membranes are moist. Posterior pharynx clear of any exudate or lesions.   Neck: normal, supple, no masses, no thyromegaly Respiratory: clear to auscultation bilaterally, no wheezing, no crackles. Normal respiratory effort.   Cardiovascular: S1 & S2 heard, regular rate and rhythm. No extremity edema. No significant JVD. Abdomen: No distension, no tenderness, no masses palpated. Bowel sounds normal.  Musculoskeletal: no clubbing / cyanosis. No joint deformity upper and lower extremities. Midline tenderness at lumbar region.    Skin: no significant rashes, lesions, ulcers. Warm, dry, well-perfused. Neurologic: No gross facial asymmetry. PERRL. No dysarthria or aphasia. Hearing grossly intact. Sensation intact. Strength 5/5 throughout.   Psychiatric: Alert and oriented to person, place, and situation. Slow to respond, blunted affect.     Labs on Admission: I have personally reviewed following labs and imaging studies  CBC:  Recent Labs Lab 05/20/16 1846  WBC 10.8*  NEUTROABS 8.2*  HGB 12.3*  HCT 37.3*  MCV 84.6  PLT 947   Basic Metabolic Panel:  Recent Labs Lab 05/20/16 1846  NA 127*  K 4.7  CL 91*  CO2 25  GLUCOSE 157*  BUN 25*  CREATININE 1.47*  CALCIUM 10.0   GFR: CrCl cannot be calculated (Unknown ideal weight.). Liver Function Tests:  Recent Labs Lab 05/20/16 1846  AST 24  ALT 22  ALKPHOS 76  BILITOT 0.5  PROT 6.5  ALBUMIN 3.8   No results for input(s): LIPASE, AMYLASE in the last 168 hours. No results for input(s): AMMONIA in the last 168 hours. Coagulation Profile: No results for input(s): INR, PROTIME in the last  168 hours. Cardiac Enzymes: No results for input(s): CKTOTAL, CKMB, CKMBINDEX, TROPONINI in the last 168 hours. BNP (last 3 results) No results for input(s): PROBNP in the last 8760 hours. HbA1C: No results for input(s): HGBA1C in the last 72 hours. CBG: No results for input(s): GLUCAP in the last 168 hours. Lipid Profile: No results for input(s): CHOL, HDL, LDLCALC, TRIG, CHOLHDL, LDLDIRECT in the last 72 hours. Thyroid Function Tests: No results for input(s): TSH, T4TOTAL, FREET4, T3FREE, THYROIDAB in the last 72 hours. Anemia Panel: No results for input(s): VITAMINB12, FOLATE, FERRITIN, TIBC, IRON, RETICCTPCT in the last 72 hours. Urine analysis:    Component Value Date/Time   COLORURINE YELLOW 05/20/2016 2001   APPEARANCEUR CLEAR 05/20/2016 2001   LABSPEC 1.011 05/20/2016 2001   PHURINE 5.0 05/20/2016 2001   GLUCOSEU NEGATIVE 05/20/2016 2001   HGBUR NEGATIVE 05/20/2016 2001   [place] NEGATIVE 05/20/2016 2001   BILIRUBINUR neg 08/27/2015 [place] 05/20/2016 2001   PROTEINUR NEGATIVE 05/20/2016 2001   UROBILINOGEN negative 08/27/2015 1626   UROBILINOGEN 1.0 07/25/2014 1440   NITRITE NEGATIVE 05/20/2016 2001   LEUKOCYTESUR NEGATIVE 05/20/2016 2001   Sepsis Labs: @LABRCNTIP (procalcitonin:4,lacticidven:4) )No results found for this or any previous visit (from the past 240 hour(s)).   Radiological Exams on Admission: Dg Chest 2 View  Result Date: 05/20/2016 CLINICAL DATA:  83 y/o M; status post fall onto back striking the right mid thorax. EXAM: CHEST  2 VIEW THORACIC SPINE TWO OR MORE VIEWS COMPARISON:  11/27/2015 chest radiograph FINDINGS: Chest radiograph: Normal cardiac silhouette. Aortic atherosclerosis with calcification. Clear lungs. No pleural effusion or pneumothorax. No acute rib fracture identified. Mild interval anterior loss of height of L1 vertebral body. Thoracic spine radiograph:  Mild interval age-indeterminate loss of the L1 vertebral body.  Mild lower thoracic dextrocurvature. The advanced degenerative changes of the cervicothoracic junction. IMPRESSION: 1. Mild interval anterior loss of height of the L1 vertebral body, age indeterminate compression fracture. 2. No acute pulmonary process. Electronically Signed   By: Kristine Garbe M.D.   On: 05/20/2016 19:22   Dg Thoracic Spine 2 View  Result Date: 05/20/2016 CLINICAL DATA:  83 y/o M; status post fall onto back striking the right mid thorax. EXAM: CHEST  2 VIEW THORACIC SPINE TWO OR MORE VIEWS COMPARISON:  11/27/2015 chest radiograph FINDINGS: Chest radiograph: Normal cardiac silhouette. Aortic atherosclerosis with calcification. Clear lungs. No pleural effusion or pneumothorax. No acute rib fracture identified. Mild interval anterior loss of height of L1 vertebral body. Thoracic spine radiograph: Mild interval age-indeterminate loss of the L1 vertebral body. Mild lower thoracic dextrocurvature. The advanced degenerative changes of the cervicothoracic junction. IMPRESSION: 1. Mild interval anterior loss of height of the L1 vertebral body, age indeterminate compression fracture. 2. No acute pulmonary process. Electronically Signed   By: Kristine Garbe M.D.   On: 05/20/2016 19:22    EKG: Independently reviewed. Sinus rhythm, PAC.   Assessment/Plan  1. Hyponatremia  - Pt presents with generalized weakness and lethargy, no new focal neurologic deficits identified  - ED workup reveals hyponatremia to 127, likely responsible for presenting complaints - Pt may be hypovolemic, but not grossly so, will check urine osm and urine sodium  - Lasix could be contributing, and Paxil could be causing a SIADH  - He was given 1 liter NS in ED and continued on gentle NS infusion  - Hold Lasix and Paxil for now, follow-up urine studies, follow serial chem panels   2. Generalized weakness with fall  - Suspected secondary to #1; no new focal neurologic deficits identified  -  Anticipate improvement with correction of sodium, following serial chem panels as above   3. Lumbar compression fracture - Suspected to be acute and secondary to the fall just PTA  - No new LE weakness or numbness, denies saddle anesthesia  - Conservative mgmt with analgesia and PT   4. History of CVA  - Pt has residual cognitive deficits and mild left-sided weakness  - No new focal findings  - Continue Plavix, statin   5. Type II DM  - A1c 7.3% in August 2017  - Managed with metformin only at home, held in hospital  - Check CBG with meals and qHS  - Start a low-intensity sliding-scale insulin   6. Hypertension  - BP is at goal  - Continue clonidine and Norvasc  - Hold losartan initially given apparent dehydration and bump in SCr    7. GERD - Barrett's esophagus noted on remote EGD  - Managed at home with BID PPI, will continue    8. Chronic diastolic CHF  - Appears hypovolemic on admission and is receiving IV hydration  - Follow I/O's and daily wts  - Hold Lasix for now, resume as needed    9. COPD  - No respiratory distress or wheezing; no supplemental O2 required  - Continue inhalers, prn nebs    DVT prophylaxis: sq Lovenox  Code Status: Full  Family Communication: Wife updated at bedside Disposition Plan: Admit to telemetry Consults called: None Admission status: Inpatient    Vianne Bulls, MD [facility] Pager [phone]  If 7PM-7AM, please contact night-coverage [email or web address] Password [facility]  05/20/2016, 9:39 PM

## 2016-05-20 NOTE — Progress Notes (Signed)
Received report from ED RN, [place].

## 2016-05-20 NOTE — ED Triage Notes (Signed)
Per EMS- pt was at home and was trying to stand with his walker. Pt was reported to fall backwards. Did not hit head. denies LOC. Denies head, neck, back pain. Pt is on plavix. Pt reports lower back/hip pain. Pt was also found to have wheezing and was given a breathing treatment. Pt was given of 7.5mg  albuterol. 93% on room air prior to treatment. bp 160/72. Wife reports that pt has been weaker than normal.

## 2016-05-20 NOTE — ED Provider Notes (Signed)
[facility] DEPT Provider Note   CSN: [id number] Arrival date & time: 05/20/16  1745     History   Chief Complaint Chief Complaint  Patient presents with  . Fall    HPI Chad [name] is a 83 y.o. male.  The history is provided by the patient and the spouse.  Fall  This is a new problem. The current episode started 1 to 2 hours ago. The problem occurs constantly. The problem has not changed since onset.Pertinent negatives include no chest pain, no abdominal pain, no headaches and no shortness of breath. The symptoms are aggravated by twisting. Nothing relieves the symptoms. He has tried nothing for the symptoms. The treatment provided no relief.    Past Medical History:  Diagnosis Date  . Acid reflux disease   . Arthritis    "all over"  . Arthritis   . Barrett's esophagus   . Basal cell carcinoma of nose   . CAROTID ARTERY STENOSIS, BILATERAL 08/27/2008  . Chronic bronchitis ([place])    "most q yr; he's had it several times" (12/20/2013)  . COPD (chronic obstructive pulmonary disease) ([place])    "severe" (12/20/2013)  . COPD (chronic obstructive pulmonary disease) ([place])   . Coronary artery stenosis   . Diabetes mellitus without complication ([place])   . Diaphragmatic hernia without mention of obstruction or gangrene   . Diverticulitis   . Diverticulosis of colon   . Dyspnea   . Esophageal stenosis   . Fatty liver disease, nonalcoholic   . GERD (gastroesophageal reflux disease)   . Glaucoma   . Glaucoma   . High cholesterol   . History of blood transfusion 2007   "related to OR"  . History of hiatal hernia    "repaired when esophagus removed"  . History of stomach ulcers   . HTN (hypertension)   . Hyperlipidemia   . Hypertension   . Kidney stones    "passed them all" (12/20/2013)  . Memory loss    DEMENTIA  . Personal history of other malignant neoplasm of skin   . Pneumonia 1990's X 4  . RENAL CALCULUS, HX OF 08/28/2008  . Renal disorder   . Sinus headache      "seasonal"  . Stroke ([place])   . TIA (transient ischemic attack) 1998 X "a few"  . Type II diabetes mellitus [facility])     Patient Active Problem List   Diagnosis Date Noted  . Hyponatremia 05/20/2016  . Fall at home, initial encounter 05/20/2016  . Lumbar compression fracture, closed, initial encounter ([place]) 05/20/2016  . Chronic diastolic CHF (congestive heart failure) ([place]) 05/20/2016  . Anemia 02/27/2016  . History of stroke   . Hyperlipidemia   . General weakness   . Pseudobulbar affect 07/02/2015  . Controlled diabetes mellitus type 2 with complications ([place]) 25/42/7062  . GERD without esophagitis 06/12/2015  . Cerebral thrombosis with cerebral infarction 06/05/2015  . Urinary incontinence 04/13/2015  . Moderate dementia with behavioral disturbance 03/09/2015  . Depression 03/09/2015  . Legal blindness [place] 08/07/2014  . Routine general medical examination at a health care facility 04/24/2014  . Carotid stenosis 02/24/2014  . Barrett's esophagus 10/11/2011  . FATTY LIVER DISEASE 08/27/2008  . Glaucoma 05/20/2008  . Essential hypertension 05/20/2008  . COPD GOLD II  05/20/2008  . ARTHRITIS 05/20/2008    Past Surgical History:  Procedure Laterality Date  . BASAL CELL CARCINOMA EXCISION    . CAROTID ARTERY ANGIOPLASTY    . CAROTID ENDARTERECTOMY Bilateral 02/1996-03/1996  .  CATARACT EXTRACTION W/ INTRAOCULAR LENS IMPLANT Bilateral 04/2007 - 09/2007   "left-right"  . ESOPHAGECTOMY  2007   with stomach pull through  . ESOPHAGOGASTRODUODENOSCOPY (EGD) WITH ESOPHAGEAL DILATION  10/2011  . EYE SURGERY Right 04/2006; 08/2007   "had gas bubbles put in"  . EYE SURGERY Right 01/2007   "air bubble"  . EYE SURGERY Left 12/12/2013   "cleaned his implant w/laser"  . EYE SURGERY    . GLAUCOMA SURGERY Right 11/2008   "implanted drain tube"  . HERNIA REPAIR    . KNEE ARTHROSCOPY Bilateral 04/1989; 08/1996; 06/1999   "right; left; left"  . KNEE SURGERY    . MOHS SURGERY  ?2002   "tip of  nose; basal cell"  . NASAL POLYP EXCISION  2005   "benign"  . PARS PLANA VITRECTOMY Bilateral 01/2007-05/2007  . SHOULDER ARTHROSCOPY Right 04/1998  . SHOULDER SURGERY    . VENTRAL HERNIA REPAIR  02/2006       Home Medications    Prior to Admission medications   Medication Sig Start Date End Date Taking? Authorizing Provider  ACETAMINOPHEN PO Take 650 mg by mouth every 6 (six) hours as needed for fever.    Yes Historical Provider, MD  albuterol (PROVENTIL HFA;VENTOLIN HFA) 108 (90 Base) MCG/ACT inhaler Inhale 2 puffs into the lungs every 4 (four) hours as needed for wheezing or shortness of breath.   Yes Historical Provider, MD  albuterol (PROVENTIL) (2.5 MG/3ML) 0.083% nebulizer solution Take 2.5 mg by nebulization every 4 (four) hours as needed for wheezing or shortness of breath.   Yes Historical Provider, MD  amLODipine (NORVASC) 10 MG tablet TAKE 1 TABLET(10 MG) BY MOUTH DAILY 03/23/16  Yes Hoyt Koch, MD  budesonide-formoterol [facility]) 160-4.5 MCG/ACT inhaler Inhale 2 puffs into the lungs 2 (two) times daily.   Yes Historical Provider, MD  Cholecalciferol (VITAMIN D) 1000 UNITS capsule Take 1,000 Units by mouth daily.     Yes Historical Provider, MD  cloNIDine (CATAPRES) 0.2 MG tablet TAKE ONE-HALF TABLET BY MOUTH DAILY AS NEEDED ONLY IF SYSTOLIC IS OVER 962 08/26/64  Yes Hoyt Koch, MD  clopidogrel (PLAVIX) 75 MG tablet TAKE 1 TABLET BY MOUTH EVERY DAY 08/20/15  Yes Rosalin Hawking, MD  donepezil (ARICEPT) 10 MG tablet Take 1 tablet (10 mg total) by mouth at bedtime. 02/26/16  Yes Cameron Sprang, MD  furosemide (LASIX) 20 MG tablet Take 20 mg by mouth daily.   Yes Historical Provider, MD  latanoprost (XALATAN) 0.005 % ophthalmic solution Place 1 drop into the left eye 2 (two) times daily.   Yes Historical Provider, MD  losartan (COZAAR) 100 MG tablet Take 1 tablet (100 mg total) by mouth daily. 01/28/16  Yes Hoyt Koch, MD  metFORMIN (GLUCOPHAGE) 500 MG tablet Take  1 tablet (500 mg total) by mouth 2 (two) times daily with a meal. 02/01/16  Yes Hoyt Koch, MD  Multiple Vitamins-Minerals (PRESERVISION AREDS 2) CAPS Take 1 capsule by mouth 2 (two) times daily.   Yes Historical Provider, MD  omeprazole (PRILOSEC) 20 MG capsule Take 20 mg by mouth 2 (two) times daily before a meal.    Yes Historical Provider, MD  omeprazole (PRILOSEC) 20 MG capsule Take 20 mg by mouth 2 (two) times daily before a meal.   Yes Historical Provider, MD  PARoxetine (PAXIL) 20 MG tablet Take 1 tablet (20 mg total) by mouth daily. 03/07/16  Yes Hoyt Koch, MD  potassium chloride SA (K-DUR,KLOR-CON) 20 MEQ  tablet Take 1 tablet (20 mEq total) by mouth daily. 01/27/16  Yes Hoyt Koch, MD  promethazine (PHENERGAN) 25 MG tablet Take 12.5 mg by mouth every 8 (eight) hours as needed for nausea or vomiting.    Yes Historical Provider, MD  simvastatin (ZOCOR) 20 MG tablet TAKE 1 TABLET BY MOUTH DAILY AT 6 PM 05/02/16  Yes Hoyt Koch, MD  vitamin B-12 (CYANOCOBALAMIN) 1000 MCG tablet Take 1,000 mcg by mouth daily.   Yes Historical Provider, MD  LORazepam (ATIVAN) 0.5 MG tablet Take 1 tablet (0.5 mg total) by mouth at bedtime. 02/01/16   Hoyt Koch, MD    Family History Family History  Problem Relation Age of Onset  . Colon cancer Mother   . Lung cancer Mother   . Heart attack Mother   . Stomach cancer Father   . Esophageal cancer Father   . Colon cancer Maternal Grandfather   . Heart attack Brother   . Heart attack Brother     Social History Social History  Substance Use Topics  . Smoking status: Former Smoker    Packs/day: 1.00    Years: 40.00    Types: Cigarettes    Quit date: 01/24/1978  . Smokeless tobacco: Never Used  . Alcohol use No     Allergies   Moxifloxacin; Quinolones; Sulfonamide derivatives; Timolol maleate; Amoxicillin; Augmentin [amoxicillin-pot clavulanate]; and Sulfa antibiotics   Review of Systems Review of Systems    Constitutional: Positive for fatigue. Negative for chills and fever.  HENT: Negative for ear pain and sore throat.   Eyes: Negative for pain and visual disturbance.  Respiratory: Positive for cough. Negative for shortness of breath.   Cardiovascular: Negative for chest pain and palpitations.  Gastrointestinal: Negative for abdominal pain and vomiting.  Genitourinary: Negative for dysuria and hematuria.  Musculoskeletal: Positive for back pain and myalgias. Negative for arthralgias and neck pain.  Skin: Negative for color change and rash.  Neurological: Positive for weakness. Negative for seizures, syncope and headaches.  All other systems reviewed and are negative.    Physical Exam Updated Vital Signs BP (!) 153/58 (BP Location: Right Arm)   Pulse 73   Temp 98.5 F (36.9 C) (Oral)   Resp 18   Ht 5\' 9"  (1.753 m)   Wt 70.3 kg   SpO2 94%   BMI 22.89 kg/m   Physical Exam  Constitutional: He appears well-developed and well-nourished. He has a sickly appearance.  HENT:  Head: Normocephalic and atraumatic.  Eyes: Conjunctivae and EOM are normal.  Neck: Neck supple. No spinous process tenderness and no muscular tenderness present. Normal range of motion present.  Cardiovascular: Normal rate and regular rhythm.   No murmur heard. Pulmonary/Chest: Effort normal and breath sounds normal. No respiratory distress. He has no wheezes. He has no rhonchi.  Abdominal: Soft. There is no tenderness.  Musculoskeletal: He exhibits no edema.       Thoracic back: He exhibits tenderness and pain. He exhibits no bony tenderness and no swelling.       Back:  Neurological: He is alert. No cranial nerve deficit or sensory deficit. GCS eye subscore is 4. GCS verbal subscore is 5. GCS motor subscore is 6.  Pt with slowed speech and mild weakness in LUE and LLE when compared to right which family reports is chronic  Skin: Skin is warm and dry. No rash noted.  Psychiatric: He has a normal mood and  affect. His speech is delayed.  Nursing note and  vitals reviewed.    ED Treatments / Results  Labs (all labs ordered are listed, but only abnormal results are displayed) Labs Reviewed  CBC WITH DIFFERENTIAL/PLATELET - Abnormal; Notable for the following:       Result Value   WBC 10.8 (*)    Hemoglobin 12.3 (*)    HCT 37.3 (*)    Neutro Abs 8.2 (*)    Monocytes Absolute 1.2 (*)    All other components within normal limits  COMPREHENSIVE METABOLIC PANEL - Abnormal; Notable for the following:    Sodium 127 (*)    Chloride 91 (*)    Glucose, Bld 157 (*)    BUN 25 (*)    Creatinine, Ser 1.47 (*)    GFR calc non Af Amer 42 (*)    GFR calc Af Amer 49 (*)    All other components within normal limits  URINALYSIS, ROUTINE W REFLEX MICROSCOPIC  SODIUM, URINE, RANDOM  OSMOLALITY, URINE  HEMOGLOBIN A1C  CBC  BASIC METABOLIC PANEL  BASIC METABOLIC PANEL    EKG  EKG Interpretation None       Radiology Dg Chest 2 View  Result Date: 05/20/2016 CLINICAL DATA:  83 y/o M; status post fall onto back striking the right mid thorax. EXAM: CHEST  2 VIEW THORACIC SPINE TWO OR MORE VIEWS COMPARISON:  11/27/2015 chest radiograph FINDINGS: Chest radiograph: Normal cardiac silhouette. Aortic atherosclerosis with calcification. Clear lungs. No pleural effusion or pneumothorax. No acute rib fracture identified. Mild interval anterior loss of height of L1 vertebral body. Thoracic spine radiograph: Mild interval age-indeterminate loss of the L1 vertebral body. Mild lower thoracic dextrocurvature. The advanced degenerative changes of the cervicothoracic junction. IMPRESSION: 1. Mild interval anterior loss of height of the L1 vertebral body, age indeterminate compression fracture. 2. No acute pulmonary process. Electronically Signed   By: Kristine Garbe M.D.   On: 05/20/2016 19:22   Dg Thoracic Spine 2 View  Result Date: 05/20/2016 CLINICAL DATA:  83 y/o M; status post fall onto back striking  the right mid thorax. EXAM: CHEST  2 VIEW THORACIC SPINE TWO OR MORE VIEWS COMPARISON:  11/27/2015 chest radiograph FINDINGS: Chest radiograph: Normal cardiac silhouette. Aortic atherosclerosis with calcification. Clear lungs. No pleural effusion or pneumothorax. No acute rib fracture identified. Mild interval anterior loss of height of L1 vertebral body. Thoracic spine radiograph: Mild interval age-indeterminate loss of the L1 vertebral body. Mild lower thoracic dextrocurvature. The advanced degenerative changes of the cervicothoracic junction. IMPRESSION: 1. Mild interval anterior loss of height of the L1 vertebral body, age indeterminate compression fracture. 2. No acute pulmonary process. Electronically Signed   By: Kristine Garbe M.D.   On: 05/20/2016 19:22    Procedures Procedures (including critical care time)  Medications Ordered in ED Medications  pantoprazole (PROTONIX) EC tablet 40 mg (not administered)  simvastatin (ZOCOR) tablet 20 mg (not administered)  latanoprost (XALATAN) 0.005 % ophthalmic solution 1 drop (not administered)  cloNIDine (CATAPRES) tablet 0.1 mg (not administered)  amLODipine (NORVASC) tablet 10 mg (not administered)  donepezil (ARICEPT) tablet 10 mg (not administered)  LORazepam (ATIVAN) tablet 0.5 mg (not administered)  losartan (COZAAR) tablet 100 mg (not administered)  clopidogrel (PLAVIX) tablet 75 mg (not administered)  albuterol (PROVENTIL) (2.5 MG/3ML) 0.083% nebulizer solution 2.5 mg (not administered)  mometasone-formoterol (DULERA) 200-5 MCG/ACT inhaler 2 puff (not administered)  vitamin B-12 (CYANOCOBALAMIN) tablet 1,000 mcg (not administered)  cholecalciferol (VITAMIN D) tablet 1,000 Units (not administered)  enoxaparin (LOVENOX) injection 40 mg (not administered)  sodium chloride flush (NS) 0.9 % injection 3 mL (not administered)  0.9 %  sodium chloride infusion (not administered)  acetaminophen (TYLENOL) tablet 650 mg (not administered)     Or  acetaminophen (TYLENOL) suppository 650 mg (not administered)  HYDROcodone-acetaminophen (NORCO/VICODIN) 5-325 MG per tablet 1-2 tablet (not administered)  polyethylene glycol (MIRALAX / GLYCOLAX) packet 17 g (not administered)  bisacodyl (DULCOLAX) EC tablet 5 mg (not administered)  ondansetron (ZOFRAN) tablet 4 mg (not administered)    Or  ondansetron (ZOFRAN) injection 4 mg (not administered)  insulin aspart (novoLOG) injection 0-9 Units (not administered)  insulin aspart (novoLOG) injection 0-5 Units (not administered)  HYDROcodone-acetaminophen (NORCO/VICODIN) 5-325 MG per tablet 1 tablet (1 tablet Oral Given 05/20/16 1839)  sodium chloride 0.9 % bolus 1,000 mL (1,000 mLs Intravenous New Bag/Given 05/20/16 2119)     Initial Impression / Assessment and Plan / ED Course  I have reviewed the triage vital signs and the nursing notes.  Pertinent labs & imaging results that were available during my care of the patient were reviewed by me and considered in my medical decision making (see chart for details).     83 year old male with history of diabetes, previous CVA with left-sided weakness, dementia who presents in the setting of generalized weakness. Patient had fall today after several days of worsening overall weakness. Patient was transferring from walker to lift chair when he fell backwards on to mid back. Patient with immediate pain. Patient with no numbness or tingling but brought to the emergency department for further valuation.  On arrival patient hemodynamically stable and afebrile. Patient neurovascularly intact on examination. Patient with mild weakness on left side compared to right which family reports is at baseline. Patient with tenderness to palpation in thoracic spine and upper L-spine. No saddle anesthesia or other signs of cord compression. Imaging revealed acute fracture likely at L1. Additionally basic labs revealed hyponatremia. This could be result of mild  dehydration or be cause of patient's weakness. Patient given IV fluids and will be admitted to the hospital for further management as condition. Patient was given pain medications early in course and on reassessment pain was improving. Stable at time of transfer of care.  Final Clinical Impressions(s) / ED Diagnoses   Final diagnoses:  Fall, initial encounter  Weakness  Closed compression fracture of first lumbar vertebra, initial encounter [facility])  Hyponatremia    New Prescriptions Current Discharge Medication List       Esaw Grandchild, MD 05/20/16 2228    Virgel Manifold, MD 05/25/16 [phone]

## 2016-05-20 NOTE — Progress Notes (Signed)
NURSING PROGRESS NOTE  [name] [id number] Admission Data: 05/20/2016 11:51 PM Attending Provider: Vianne Bulls, MD [id number] [name], MD Code Status: Full  Allergies:  Moxifloxacin; Quinolones; Sulfonamide derivatives; Timolol maleate; Amoxicillin; Augmentin [amoxicillin-pot clavulanate]; and Sulfa antibiotics Past Medical History:   has a past medical history of Acid reflux disease; Arthritis; Arthritis; Barrett's esophagus; Basal cell carcinoma of nose; CAROTID ARTERY STENOSIS, BILATERAL (08/27/2008); Chronic bronchitis (HCC); COPD (chronic obstructive pulmonary disease) ([place]); COPD (chronic obstructive pulmonary disease) ([place]); Coronary artery stenosis; Diabetes mellitus without complication ([place]); Diaphragmatic hernia without mention of obstruction or gangrene; Diverticulitis; Diverticulosis of colon; Dyspnea; Esophageal stenosis; Fatty liver disease, nonalcoholic; GERD (gastroesophageal reflux disease); Glaucoma; Glaucoma; High cholesterol; History of blood transfusion (2007); History of hiatal hernia; History of stomach ulcers; HTN (hypertension); Hyperlipidemia; Hypertension; Kidney stones; Memory loss; Personal history of other malignant neoplasm of skin; Pneumonia (1990's X 4); RENAL CALCULUS, HX OF (08/28/2008); Renal disorder; Sinus headache; Stroke [facility]); TIA (transient ischemic attack) (1998 X "a few"); and Type II diabetes mellitus ([place]). Past Surgical History:   has a past surgical history that includes Esophagectomy (2007); Pars plana vitrectomy (Bilateral, 01/2007-05/2007); Ventral hernia repair (02/2006); Knee arthroscopy (Bilateral, [phone]; 08/1996; 06/1999); Carotid endarterectomy (Bilateral, 02/1996-03/1996); Shoulder arthroscopy (Right, 04/1998); Mohs surgery (?2002); Nasal polyp excision (2005); Hernia repair; Cataract extraction w/ intraocular lens implant (Bilateral, 04/2007 - 09/2007); Glaucoma surgery (Right, 11/2008); Esophagogastroduodenoscopy (egd) with esophageal dilation  (10/2011); Eye surgery (Right, 04/2006; 08/2007); Eye surgery (Right, 01/2007); Eye surgery (Left, 12/12/2013); Knee surgery; Carotid angioplasty; Shoulder surgery; Excision basal cell carcinoma; and Eye surgery. Social History:   reports that he quit smoking about 38 years ago. His smoking use included Cigarettes. He has a 40.00 pack-year smoking history. He has never used smokeless tobacco. He reports that he does not drink alcohol or use drugs.  Chad [name] is a 83 y.o. male patient admitted from ED:   Last Documented Vital Signs: Blood pressure (!) 153/58, pulse 73, temperature 98.5 F (36.9 C), temperature source Oral, resp. rate 18, height 5\' 9"  (1.753 m), weight 70.3 kg (154 lb 15.7 oz), SpO2 94 %.  Cardiac Monitoring: Box # 24 in place. Cardiac monitor yields:sinus tachycardia.  IV Fluids:  IV in place, occlusive dsg intact without redness, IV cath antecubital right, condition patent and no redness normal saline.   Skin: Appropriate for ethnicity with bruising on arms and stage 1 pressure ulcer on left buttcheek.   Patient/Family orientated to room. Information packet given to patient/family. Admission inpatient armband information verified with patient/family to include name and date of birth and placed on patient arm. Side rails up x 2, fall assessment and education completed with patient/family. Patient/family able to verbalize understanding of risk associated with falls and verbalized understanding to call for assistance before getting out of bed. Call light within reach. Patient/family able to voice and demonstrate understanding of unit orientation instructions.    Will continue to evaluate and treat per MD orders.  Clydell Hakim RN, BSN

## 2016-05-21 ENCOUNTER — Inpatient Hospital Stay (HOSPITAL_COMMUNITY): Payer: PPO

## 2016-05-21 DIAGNOSIS — L8990 Pressure ulcer of unspecified site, unspecified stage: Secondary | ICD-10-CM | POA: Insufficient documentation

## 2016-05-21 LAB — BASIC METABOLIC PANEL
ANION GAP: 10 (ref 5–15)
ANION GAP: 10 (ref 5–15)
ANION GAP: 11 (ref 5–15)
BUN: 15 mg/dL (ref 6–20)
BUN: 18 mg/dL (ref 6–20)
BUN: 21 mg/dL — ABNORMAL HIGH (ref 6–20)
CHLORIDE: 95 mmol/L — AB (ref 101–111)
CO2: 24 mmol/L (ref 22–32)
CO2: 25 mmol/L (ref 22–32)
CO2: 25 mmol/L (ref 22–32)
Calcium: 9.5 mg/dL (ref 8.9–10.3)
Calcium: 9.5 mg/dL (ref 8.9–10.3)
Calcium: 9.6 mg/dL (ref 8.9–10.3)
Chloride: 92 mmol/L — ABNORMAL LOW (ref 101–111)
Chloride: 93 mmol/L — ABNORMAL LOW (ref 101–111)
Creatinine, Ser: 1.3 mg/dL — ABNORMAL HIGH (ref 0.61–1.24)
Creatinine, Ser: 1.17 mg/dL (ref 0.61–1.24)
Creatinine, Ser: 1.12 mg/dL (ref 0.61–1.24)
GFR calc Af Amer: 56 mL/min — ABNORMAL LOW (ref >60)
GFR calc Af Amer: >60 mL/min (ref >60)
GFR calc non Af Amer: 49 mL/min — ABNORMAL LOW (ref >60)
GFR calc non Af Amer: 55 mL/min — ABNORMAL LOW (ref >60)
GFR, EST NON AFRICAN AMERICAN: 58 mL/min — AB (ref >60)
GLUCOSE: 121 mg/dL — AB (ref 65–99)
GLUCOSE: 126 mg/dL — AB (ref 65–99)
Glucose, Bld: 124 mg/dL — ABNORMAL HIGH (ref 65–99)
POTASSIUM: 4.6 mmol/L (ref 3.5–5.1)
Potassium: 4.1 mmol/L (ref 3.5–5.1)
Potassium: 4.4 mmol/L (ref 3.5–5.1)
SODIUM: 128 mmol/L — AB (ref 135–145)
Sodium: 127 mmol/L — ABNORMAL LOW (ref 135–145)
Sodium: 130 mmol/L — ABNORMAL LOW (ref 135–145)

## 2016-05-21 LAB — CBC
HEMATOCRIT: 35.9 % — AB (ref 39.0–52.0)
Hemoglobin: 11.9 g/dL — ABNORMAL LOW (ref 13.0–17.0)
MCH: 28.1 pg (ref 26.0–34.0)
MCHC: 33.1 g/dL (ref 30.0–36.0)
MCV: 84.9 fL (ref 78.0–100.0)
PLATELETS: 171 10*3/uL (ref 150–400)
RBC: 4.23 MIL/uL (ref 4.22–5.81)
RDW: 15.7 % — AB (ref 11.5–15.5)
WBC: 7.5 10*3/uL (ref 4.0–10.5)

## 2016-05-21 LAB — GLUCOSE, CAPILLARY
GLUCOSE-CAPILLARY: 128 mg/dL — AB (ref 65–99)
GLUCOSE-CAPILLARY: 190 mg/dL — AB (ref 65–99)
Glucose-Capillary: 149 mg/dL — ABNORMAL HIGH (ref 65–99)

## 2016-05-21 LAB — SODIUM, URINE, RANDOM: SODIUM UR: 53 mmol/L

## 2016-05-21 LAB — OSMOLALITY, URINE: Osmolality, Ur: 398 mOsm/kg (ref 300–900)

## 2016-05-21 MED ORDER — HYDROMORPHONE HCL 1 MG/ML IJ SOLN
1.0000 mg | INTRAMUSCULAR | Status: DC | PRN
Start: 2016-05-21 — End: 2016-05-25
  Administered 2016-05-21: 1 mg via INTRAVENOUS
  Filled 2016-05-21: qty 1

## 2016-05-21 NOTE — Progress Notes (Signed)
PT Cancellation Note  Patient Details Name: Chad [name] MRN: [id number] DOB: November 29, 1929   Cancelled Treatment:    Reason Eval/Treat Not Completed: Fatigue/lethargy limiting ability to participate;Patient's level of consciousness. No family present and pt has diagnosis of dementia. Pt had to be given dilaudid per RN. Will check back tomorrow as time allows when pt is more alert and wife is present.    Scheryl Marten PT, DPT  [phone]  05/21/2016, 2:18 PM

## 2016-05-21 NOTE — Progress Notes (Signed)
Patient ID: Chad [name], male   DOB: 07/02/1929, 83 y.o.   MRN: [id number]                                                                PROGRESS NOTE                                                                                                                                                                                                             Patient Demographics:    Chad [name], is a 83 y.o. male, DOB - December 25, 1929, LPF:[id number]  Admit date - 05/20/2016   Admitting Physician Vianne Bulls, MD  Outpatient Primary MD for the patient is Hoyt Koch, MD  LOS - 1  Outpatient Specialists:     Chief Complaint  Patient presents with  . Fall       Brief Narrative  83 y.o. male with medical history significant for type 2 diabetes mellitus, COPD, hypertension, Barrett esophagus, chronic diastolic CHF, history of CVA with mild left-sided weakness, and dementia who presents to the ED with generalized weakness and a fall at home. Patient is accompanied by his wife who assists with the history. He had reportedly been in his usual state until noting the insidious development of generalized weakness over the past 1-2 weeks. Over the past couple days, his wife has had increasing difficulty helping him to get up from bed, where he had recently been able to do this on his own. She is now having to use all of her strength to try to pull the patient up to a standing position. Just prior to arrival, the patient was standing with his walker, attempting to transfer to a chair, when he fell backwards onto his bottom, with no loss of consciousness or head strike. Patient reported pain at the low back following this. He has complained of mild nausea the past couple days and has chronic loose stools, but there has not been any vomiting or diarrhea. His appetite is largely unchanged. Patient denies headaches, change in vision or hearing, or any new focal numbness or weakness. He denies recent  fevers or chills, denies significant cough or dyspnea, and denies abdominal pain or dysuria.  ED Course: Upon arrival to the ED, patient is found to be afebrile, saturating well on room  air, and with vital signs stable. EKG features a sinus rhythm with PAC and chest x-ray is negative for acute cardiopulmonary disease, but notable for an age-indeterminate compression fracture of the L1 vertebral body. Chemistry panels notable for a sodium of 127, previously normal, and serum creatinine 1.47, slightly up from an apparent baseline of 1.25. CBC features a very mild leukocytosis to 10,800 and a normocytic anemia with hemoglobin 12.3. Urinalysis is unremarkable. Patient was treated with 5 mg Norco in the ED and given a 1 L normal saline bolus. He has remained hemodynamically stable and not in any apparent respiratory distress, and he will be admitted to the telemetry unit for ongoing evaluation and management of generalized weakness with fall, suspected secondary to hyponatremia.   Subjective:    Chad [name] today still feels weak, seems somewhat confused.  axox1 (not year, not place), pt has baseline dementia No headache, No chest pain, No abdominal pain - No Nausea, No new weakness tingling or numbness, No Cough - SOB.    Assessment  & Plan :    Principal Problem:   Hyponatremia Active Problems:   Essential hypertension   COPD GOLD II    Barrett's esophagus   Depression   Controlled diabetes mellitus type 2 with complications (HCC)   History of stroke   General weakness   Fall at home, initial encounter   Closed compression fracture of L1 lumbar vertebra (HCC)   Chronic diastolic CHF (congestive heart failure) (HCC)   Pressure injury of skin   1. Hyponatremia  ? Secondary to Lasix Urine sodium, Urine osm pending.  Holding lasix Check cmp in am  2. Generalized weakness with fall  - Suspected secondary to #1; no new focal neurologic deficits identified  - Anticipate improvement with  correction of sodium, following serial chem panels as above   3. Lumbar compression fracture - Suspected to be acute and secondary to the fall just PTA  - No new LE weakness or numbness, denies saddle anesthesia  - Conservative mgmt with analgesia MRI L spine today, if acute may benefit from vertebroplasty/kyphoplasty Will need outpatient Bone density if none done w/in past 2 years  4. History of CVA /Baseline dementia - Pt has residual cognitive deficits and mild left-sided weakness  - No new focal findings  - Continue Plavix, statin   5. Type II DM  - A1c 7.3% in August 2017  - Managed with metformin only at home, held in hospital  - Check CBG with meals and qHS  - Cont low-intensity sliding-scale insulin   6. Hypertension  - BP is at goal  - Continue clonidine and Norvasc  - Hold losartan initially given apparent dehydration and bump in SCr    7. GERD - Barrett's esophagus noted on remote EGD  - Managed at home with BID PPI, will continue    8. Chronic diastolic CHF  - Appears hypovolemic on admission and is receiving IV hydration  - Follow I/O's and daily wts  - Hold Lasix for now due to hyponatremia Wife says does not know about this diagnosis   9. COPD  - No respiratory distress or wheezing; no supplemental O2 required  - Continue inhalers, prn nebs      Lab Results  Component Value Date   PLT 171 05/21/2016      Anti-infectives    None        Objective:   Vitals:   05/20/16 2214 05/21/16 0603 05/21/16 0609 05/21/16 0728  BP: (!) 153/58 Marland Kitchen)  158/67 (!) 146/77   Pulse: 73 79 74   Resp: 18 18 18    Temp: 98.5 F (36.9 C) 98.8 F (37.1 C) 99.3 F (37.4 C)   TempSrc: Oral Oral Oral   SpO2: 94% 92% 94% 92%  Weight: 70.3 kg (154 lb 15.7 oz)  70.3 kg (154 lb 15.7 oz)   Height: 5\' 9"  (1.753 m)       Wt Readings from Last 3 Encounters:  05/21/16 70.3 kg (154 lb 15.7 oz)  04/18/16 72.7 kg (160 lb 5 oz)  04/06/16 72.6 kg (160 lb)      Intake/Output Summary (Last 24 hours) at 05/21/16 0747 Last data filed at 05/21/16 0532  Gross per 24 hour  Intake            597.5 ml  Output                0 ml  Net            597.5 ml     Physical Exam  Awake Alert, Oriented X 1, No new F.N deficits, Normal affect [place].AT,PERRAL Supple Neck,No JVD, No cervical lymphadenopathy appriciated.  Symmetrical Chest wall movement, Good air movement bilaterally, CTAB RRR,No Gallops,Rubs or new Murmurs, No Parasternal Heave +ve B.Sounds, Abd Soft, No tenderness, No organomegaly appriciated, No rebound - guarding or rigidity. No Cyanosis, Clubbing or edema, No new Rash or bruise      Data Review:    CBC  Recent Labs Lab 05/20/16 1846 05/21/16 0538  WBC 10.8* 7.5  HGB 12.3* 11.9*  HCT 37.3* 35.9*  PLT 210 171  MCV 84.6 84.9  MCH 27.9 28.1  MCHC 33.0 33.1  RDW 15.5 15.7*  LYMPHSABS 1.1  --   MONOABS 1.2*  --   EOSABS 0.2  --   BASOSABS 0.0  --     Chemistries   Recent Labs Lab 05/20/16 1846 05/21/16 0056 05/21/16 0538  NA 127* 127* 130*  K 4.7 4.1 4.4  CL 91* 92* 95*  CO2 25 25 25   GLUCOSE 157* 126* 121*  BUN 25* 21* 18  CREATININE 1.47* 1.30* 1.17  CALCIUM 10.0 9.5 9.5  AST 24  --   --   ALT 22  --   --   ALKPHOS 76  --   --   BILITOT 0.5  --   --    ------------------------------------------------------------------------------------------------------------------ No results for input(s): CHOL, HDL, LDLCALC, TRIG, CHOLHDL, LDLDIRECT in the last 72 hours.  Lab Results  Component Value Date   HGBA1C 7.3 (H) 08/27/2015   ------------------------------------------------------------------------------------------------------------------ No results for input(s): TSH, T4TOTAL, T3FREE, THYROIDAB in the last 72 hours.  Invalid input(s): FREET3 ------------------------------------------------------------------------------------------------------------------ No results for input(s): VITAMINB12, FOLATE,  FERRITIN, TIBC, IRON, RETICCTPCT in the last 72 hours.  Coagulation profile No results for input(s): INR, PROTIME in the last 168 hours.  No results for input(s): DDIMER in the last 72 hours.  Cardiac Enzymes No results for input(s): CKMB, TROPONINI, MYOGLOBIN in the last 168 hours.  Invalid input(s): CK ------------------------------------------------------------------------------------------------------------------    Component Value Date/Time   BNP 174.9 (H) 06/01/2015 1844    Inpatient Medications  Scheduled Meds: . amLODipine  10 mg Oral Daily  . cholecalciferol  1,000 Units Oral Daily  . cloNIDine  0.1 mg Oral Daily  . clopidogrel  75 mg Oral Daily  . donepezil  10 mg Oral QHS  . enoxaparin (LOVENOX) injection  40 mg Subcutaneous Q24H  . insulin aspart  0-5 Units Subcutaneous QHS  .  insulin aspart  0-9 Units Subcutaneous TID WC  . latanoprost  1 drop Left Eye BID  . LORazepam  0.5 mg Oral QHS  . mometasone-formoterol  2 puff Inhalation BID  . pantoprazole  40 mg Oral BID  . simvastatin  20 mg Oral q1800  . sodium chloride flush  3 mL Intravenous Q12H  . vitamin B-12  1,000 mcg Oral Daily   Continuous Infusions: . sodium chloride 75 mL/hr at 05/21/16 0645   PRN Meds:.acetaminophen **OR** acetaminophen, albuterol, bisacodyl, HYDROcodone-acetaminophen, ondansetron **OR** ondansetron (ZOFRAN) IV, polyethylene glycol  Micro Results No results found for this or any previous visit (from the past 240 hour(s)).  Radiology Reports Dg Chest 2 View  Result Date: 05/20/2016 CLINICAL DATA:  83 y/o M; status post fall onto back striking the right mid thorax. EXAM: CHEST  2 VIEW THORACIC SPINE TWO OR MORE VIEWS COMPARISON:  11/27/2015 chest radiograph FINDINGS: Chest radiograph: Normal cardiac silhouette. Aortic atherosclerosis with calcification. Clear lungs. No pleural effusion or pneumothorax. No acute rib fracture identified. Mild interval anterior loss of height of L1  vertebral body. Thoracic spine radiograph: Mild interval age-indeterminate loss of the L1 vertebral body. Mild lower thoracic dextrocurvature. The advanced degenerative changes of the cervicothoracic junction. IMPRESSION: 1. Mild interval anterior loss of height of the L1 vertebral body, age indeterminate compression fracture. 2. No acute pulmonary process. Electronically Signed   By: Kristine Garbe M.D.   On: 05/20/2016 19:22   Dg Thoracic Spine 2 View  Result Date: 05/20/2016 CLINICAL DATA:  83 y/o M; status post fall onto back striking the right mid thorax. EXAM: CHEST  2 VIEW THORACIC SPINE TWO OR MORE VIEWS COMPARISON:  11/27/2015 chest radiograph FINDINGS: Chest radiograph: Normal cardiac silhouette. Aortic atherosclerosis with calcification. Clear lungs. No pleural effusion or pneumothorax. No acute rib fracture identified. Mild interval anterior loss of height of L1 vertebral body. Thoracic spine radiograph: Mild interval age-indeterminate loss of the L1 vertebral body. Mild lower thoracic dextrocurvature. The advanced degenerative changes of the cervicothoracic junction. IMPRESSION: 1. Mild interval anterior loss of height of the L1 vertebral body, age indeterminate compression fracture. 2. No acute pulmonary process. Electronically Signed   By: Kristine Garbe M.D.   On: 05/20/2016 19:22    Time Spent in minutes  30   Jani Gravel M.D on 05/21/2016 at 7:47 AM  Between 7am to 7pm - Pager - [phone]  After 7pm go to [email or web address] - password [facility]  [facility] -  Office  [phone]

## 2016-05-21 NOTE — Progress Notes (Signed)
PT Cancellation Note  Patient Details Name: Chad [name] MRN: [id number] DOB: [birth year]   Cancelled Treatment:    Reason Eval/Treat Not Completed: Patient at procedure or test/unavailable. Pt in MRI. Will check back as time allows.    Scheryl Marten PT, DPT  [phone]  05/21/2016, 11:18 AM

## 2016-05-22 ENCOUNTER — Inpatient Hospital Stay (HOSPITAL_COMMUNITY): Payer: PPO

## 2016-05-22 ENCOUNTER — Encounter (HOSPITAL_COMMUNITY): Payer: Self-pay | Admitting: Radiology

## 2016-05-22 ENCOUNTER — Other Ambulatory Visit: Payer: Self-pay | Admitting: Neurology

## 2016-05-22 LAB — COMPREHENSIVE METABOLIC PANEL
ALT: 16 U/L — ABNORMAL LOW (ref 17–63)
AST: 15 U/L (ref 15–41)
Albumin: 3.3 g/dL — ABNORMAL LOW (ref 3.5–5.0)
Alkaline Phosphatase: 63 U/L (ref 38–126)
Anion gap: 11 (ref 5–15)
BUN: 16 mg/dL (ref 6–20)
CHLORIDE: 94 mmol/L — AB (ref 101–111)
CO2: 23 mmol/L (ref 22–32)
Calcium: 9.5 mg/dL (ref 8.9–10.3)
Creatinine, Ser: 1.17 mg/dL (ref 0.61–1.24)
GFR, EST NON AFRICAN AMERICAN: 55 mL/min — AB (ref >60)
Glucose, Bld: 183 mg/dL — ABNORMAL HIGH (ref 65–99)
POTASSIUM: 4.2 mmol/L (ref 3.5–5.1)
Sodium: 128 mmol/L — ABNORMAL LOW (ref 135–145)
Total Bilirubin: 0.9 mg/dL (ref 0.3–1.2)
Total Protein: 6.2 g/dL — ABNORMAL LOW (ref 6.5–8.1)

## 2016-05-22 LAB — CBC
HEMATOCRIT: 37.9 % — AB (ref 39.0–52.0)
Hemoglobin: 12.4 g/dL — ABNORMAL LOW (ref 13.0–17.0)
MCH: 28 pg (ref 26.0–34.0)
MCHC: 32.7 g/dL (ref 30.0–36.0)
MCV: 85.6 fL (ref 78.0–100.0)
PLATELETS: 176 10*3/uL (ref 150–400)
RBC: 4.43 MIL/uL (ref 4.22–5.81)
RDW: 15.7 % — ABNORMAL HIGH (ref 11.5–15.5)
WBC: 13.9 10*3/uL — AB (ref 4.0–10.5)

## 2016-05-22 LAB — OSMOLALITY: Osmolality: 275 mOsm/kg (ref 275–295)

## 2016-05-22 LAB — HEMOGLOBIN A1C
HEMOGLOBIN A1C: 6.3 % — AB (ref 4.8–5.6)
MEAN PLASMA GLUCOSE: 134 mg/dL

## 2016-05-22 LAB — GLUCOSE, CAPILLARY
GLUCOSE-CAPILLARY: 165 mg/dL — AB (ref 65–99)
GLUCOSE-CAPILLARY: 108 mg/dL — AB (ref 65–99)
GLUCOSE-CAPILLARY: 141 mg/dL — AB (ref 65–99)
GLUCOSE-CAPILLARY: 128 mg/dL — AB (ref 65–99)

## 2016-05-22 MED ORDER — SODIUM CHLORIDE 0.9 % IV SOLN
INTRAVENOUS | Status: AC
  Administered 2016-05-22 – 2016-05-23 (×2): via INTRAVENOUS

## 2016-05-22 MED ORDER — IOPAMIDOL (ISOVUE-300) INJECTION 61%
INTRAVENOUS | Status: AC
  Administered 2016-05-22: 75 mL
  Filled 2016-05-22: qty 30

## 2016-05-22 MED ORDER — IOPAMIDOL (ISOVUE-300) INJECTION 61%
INTRAVENOUS | Status: AC
  Filled 2016-05-22: qty 100

## 2016-05-22 MED ORDER — SACCHAROMYCES BOULARDII 250 MG PO CAPS
250.0000 mg | ORAL_CAPSULE | Freq: Two times a day (BID) | ORAL | Status: DC
  Administered 2016-05-22 – 2016-05-27 (×10): 250 mg via ORAL
  Filled 2016-05-22 (×10): qty 1

## 2016-05-22 MED ORDER — IOPAMIDOL (ISOVUE-300) INJECTION 61%
30.0000 mL | Freq: Once | INTRAVENOUS | Status: DC | PRN
Start: 2016-05-22 — End: 2016-05-27

## 2016-05-22 MED ORDER — IOPAMIDOL (ISOVUE-300) INJECTION 61%
INTRAVENOUS | Status: AC
  Administered 2016-05-22: 30 mL
  Filled 2016-05-22: qty 30

## 2016-05-22 MED ORDER — AZITHROMYCIN 500 MG IV SOLR
500.0000 mg | INTRAVENOUS | Status: DC
Start: 2016-05-22 — End: 2016-05-25
  Administered 2016-05-22 – 2016-05-24 (×3): 500 mg via INTRAVENOUS
  Filled 2016-05-22 (×3): qty 500

## 2016-05-22 MED ORDER — DEXTROSE 5 % IV SOLN
1.0000 g | INTRAVENOUS | Status: DC
  Administered 2016-05-22 – 2016-05-25 (×4): 1 g via INTRAVENOUS
  Filled 2016-05-22 (×4): qty 10

## 2016-05-22 NOTE — Progress Notes (Signed)
Patient ID: Chad [name], male   DOB: 1929-03-15, 83 y.o.   MRN: [id number] IR aware of consult for the L1 compression fracture.  Patient not in room at this time.  Plan for formal consult on 4/30.

## 2016-05-22 NOTE — Care Management Note (Signed)
Case Management Note  Patient Details  Name: Chad [name] MRN: [id number] Date of Birth: [birth year]  Subjective/Objective:           Admitted s/p a fall,  suffered  L1 compression fx, hx of DM, COPD, HTN, CHF, CVA with Lt weakness, dementia, depression.   Last admit/[facility] 2/3-25/2018 for  Early pneumonia vs Bronchitis      Riel Hirschman (Spouse)     [phone]       Action/Plan: Discharge planning in process. Per PT's recommendation: SNF....Marland Kitchenreferral made with CSW, CM continue to follow for disposition needs.  Expected Discharge Date:                  Expected Discharge Plan:  Skilled Nursing Facility  In-House Referral:  Clinical Social Work  Discharge planning Services  CM Consult  Post Acute Care Choice:    Choice offered to:     DME Arranged:    DME Agency:     HH Arranged:    [place] Agency:     Status of Service:  In process, will continue to follow  If discussed at Long Length of Stay Meetings, dates discussed:    Additional Comments:  Sharin Mons, RN 05/22/2016, 6:03 PM

## 2016-05-22 NOTE — Progress Notes (Signed)
Patient ID: Chad [name], male   DOB: [birth year], 83 y.o.   MRN: [id number]                                                                PROGRESS NOTE                                                                                                                                                                                                             Patient Demographics:    Chad [name], is a 83 y.o. male, DOB - 11/27/1929, IZT:[id number]  Admit date - 05/20/2016   Admitting Physician Vianne Bulls, MD  Outpatient Primary MD for the patient is Hoyt Koch, MD  LOS - 2  Outpatient Specialists:    Chief Complaint  Patient presents with  . Fall       Brief Narrative    83 y.o.malewith medical history significant for type 2 diabetes mellitus, COPD, hypertension, Barrett esophagus, chronic diastolic CHF, history of CVA with mild left-sided weakness, and dementia who presents to the ED with generalized weakness and a fall at home. Patient is accompanied by his wife who assists with the history. He had reportedly been in his usual state until noting the insidious development of generalized weakness over the past 1-2 weeks. Over the past couple days, his wife has had increasing difficulty helping him to get up from bed, where he had recently been able to do this on his own. She is now having to use all of her strength to try to pull the patient up to a standing position. Just prior to arrival, the patient was standing with his walker, attempting to transfer to a chair, when he fell backwards onto his bottom, with no loss of consciousness or head strike. Patient reported pain at the low back following this. He has complained of mild nausea the past couple days and has chronic loose stools, but there has not been any vomiting or diarrhea. His appetite is largely unchanged. Patient denies headaches, change in vision or hearing, or any new focal numbness or weakness. He denies recent  fevers or chills, denies significant cough or dyspnea, and denies abdominal pain or dysuria.  ED Course:Upon arrival to the ED, patient is found to be afebrile, saturating well on room air, and  with vital signs stable. EKG features a sinus rhythm with PAC and chest x-ray is negative for acute cardiopulmonary disease, but notable for an age-indeterminate compression fracture of the L1 vertebral body. Chemistry panels notable for a sodium of 127, previously normal, and serum creatinine 1.47, slightly up from an apparent baseline of 1.25. CBC features a very mild leukocytosis to 10,800 and a normocytic anemia with hemoglobin 12.3. Urinalysis is unremarkable. Patient was treated with 5 mg Norco in the ED and given a 1 L normal saline bolus. He has remained hemodynamically stable and not in any apparent respiratory distress, and he will be admitted to the telemetry unit for ongoing evaluation and management of generalized weakness with fall, suspected secondary to hyponatremia.  4/28  Pt was hydrated gently w ns iv,  MRI L spine =>L1 compression fracture.       Subjective:    Chad [name] today seems more somnolent.  His Na=128,  Pt has baseline dementia and unable to provide more history this am. No obvious complaints, such as  No headache, No chest pain, No abdominal pain - No Nausea, No new weakness tingling or numbness, No Cough - SOB. Pt is on o2 [place] this am.      Assessment  & Plan :    Principal Problem:   Hyponatremia Active Problems:   Essential hypertension   COPD GOLD II    Barrett's esophagus   Depression   Controlled diabetes mellitus type 2 with complications (HCC)   History of stroke   General weakness   Fall at home, initial encounter   Closed compression fracture of L1 lumbar vertebra (HCC)   Chronic diastolic CHF (congestive heart failure) (HCC)   Pressure injury of skin    Leukocytosis ? pain CXR PA and Lateral  Lumbar compression fracture - Suspected to be acute  and secondary to the fall just PTA  - No new LE weakness or numbness, denies saddle anesthesia  - Conservative mgmt with analgesia MRI L spine today, if acute may benefit from vertebroplasty/kyphoplasty Will need outpatient Bone density if none done w/in past 2 years IR consulted regarding possible need for vertebroplasty/kypoplasty for pain control for L1 compression fracture NPO for now If not having procedure then will change back to cardiac/diabetic diet  Hematoma (paravertebral) Holding dvt prophylaxis lovenox today SCD  Hyponatremia  ? Secondary to Lasix Holding lasix Serum osm added to AM labs, if this is low since Urine Na, and Urine Osm high, will consider order CT chest/ abd/pelvis Hydrate gently with ns iv  Check cmp in am  Generalized weakness with fall  - Suspected secondary to #1; no new focal neurologic deficits identified  - Anticipate improvement with correction of sodium, following serial chem panels as above PT to evaluate and tx, consider SNF placement for rehab  History of CVA /Baseline dementia - Pt has residual cognitive deficits and mild left-sided weakness  - No new focal findings  - Continue Plavix, statin   Type II DM  - A1c 7.3% in August 2017  - Managed with metformin only at home, held in hospital  - Check CBG with meals and qHS  - Cont low-intensity sliding-scale insulin   Hypertension  - BP is at goal  - Continue clonidine and Norvasc  - Hold losartan initially given apparent dehydration and bump in SCr   GERD - Barrett's esophagus noted on remote EGD  - Managed at home with BID PPI, will continue   Chronic diastolic CHF  -  Appears hypovolemic on admission and is receiving IV hydration  - Follow I/O's and daily wts  - Hold Lasix for now due to hyponatremia Wife says does not know about this diagnosis  COPD  - No respiratory distress or wheezing; no supplemental O2 required  - Continue inhalers, prn nebs    Dementia Cont Aricept   Code Status : FULL CODE  Family Communication  : w wife and patient  Disposition Plan  :  Home vs SNF  Barriers For Discharge :   Consults  :  IR, PT/OT  Procedures  :  MRI L spine 4/28  DVT Prophylaxis  :   SCDs   Lab Results  Component Value Date   PLT 176 05/22/2016    Antibiotics  :    Anti-infectives    None        Objective:   Vitals:   05/21/16 2219 05/21/16 2240 05/22/16 0508 05/22/16 0510  BP: (!) 126/58  (!) 151/64   Pulse: 88  92   Resp: 18  18   Temp: 99.1 F (37.3 C)  98.1 F (36.7 C)   TempSrc: Oral  Oral   SpO2: (!) 85% 97% 96%   Weight:    68.6 kg (151 lb 3.2 oz)  Height:        Wt Readings from Last 3 Encounters:  05/22/16 68.6 kg (151 lb 3.2 oz)  04/18/16 72.7 kg (160 lb 5 oz)  04/06/16 72.6 kg (160 lb)     Intake/Output Summary (Last 24 hours) at 05/22/16 1034 Last data filed at 05/22/16 0905  Gross per 24 hour  Intake              180 ml  Output                0 ml  Net              180 ml     Physical Exam  Awake Alert, Oriented X 1, No new F.N deficits, Normal affect Heent: anicteric, pupils 1.55mm symmetric, direct consensual near intact Neck: no jvd Heart: rrr s1, s2, no m/g/r Lung: slight crackle left base, no wheezing Ext: no c/c/e   Data Review:    CBC  Recent Labs Lab 05/20/16 1846 05/21/16 0538 05/22/16 0915  WBC 10.8* 7.5 13.9*  HGB 12.3* 11.9* 12.4*  HCT 37.3* 35.9* 37.9*  PLT 210 171 176  MCV 84.6 84.9 85.6  MCH 27.9 28.1 28.0  MCHC 33.0 33.1 32.7  RDW 15.5 15.7* 15.7*  LYMPHSABS 1.1  --   --   MONOABS 1.2*  --   --   EOSABS 0.2  --   --   BASOSABS 0.0  --   --     Chemistries   Recent Labs Lab 05/20/16 1846 05/21/16 0056 05/21/16 0538 05/21/16 1400 05/22/16 0915  NA 127* 127* 130* 128* 128*  K 4.7 4.1 4.4 4.6 4.2  CL 91* 92* 95* 93* 94*  CO2 25 25 25 24 23   GLUCOSE 157* 126* 121* 124* 183*  BUN 25* 21* 18 15 16   CREATININE 1.47* 1.30* 1.17 1.12  1.17  CALCIUM 10.0 9.5 9.5 9.6 9.5  AST 24  --   --   --  15  ALT 22  --   --   --  16*  ALKPHOS 76  --   --   --  63  BILITOT 0.5  --   --   --  0.9   ------------------------------------------------------------------------------------------------------------------  No results for input(s): CHOL, HDL, LDLCALC, TRIG, CHOLHDL, LDLDIRECT in the last 72 hours.  Lab Results  Component Value Date   HGBA1C 7.3 (H) 08/27/2015   ------------------------------------------------------------------------------------------------------------------ No results for input(s): TSH, T4TOTAL, T3FREE, THYROIDAB in the last 72 hours.  Invalid input(s): FREET3 ------------------------------------------------------------------------------------------------------------------ No results for input(s): VITAMINB12, FOLATE, FERRITIN, TIBC, IRON, RETICCTPCT in the last 72 hours.  Coagulation profile No results for input(s): INR, PROTIME in the last 168 hours.  No results for input(s): DDIMER in the last 72 hours.  Cardiac Enzymes No results for input(s): CKMB, TROPONINI, MYOGLOBIN in the last 168 hours.  Invalid input(s): CK ------------------------------------------------------------------------------------------------------------------    Component Value Date/Time   BNP 174.9 (H) 06/01/2015 1844    Inpatient Medications  Scheduled Meds: . amLODipine  10 mg Oral Daily  . cholecalciferol  1,000 Units Oral Daily  . cloNIDine  0.1 mg Oral Daily  . clopidogrel  75 mg Oral Daily  . donepezil  10 mg Oral QHS  . insulin aspart  0-5 Units Subcutaneous QHS  . insulin aspart  0-9 Units Subcutaneous TID WC  . latanoprost  1 drop Left Eye BID  . LORazepam  0.5 mg Oral QHS  . mometasone-formoterol  2 puff Inhalation BID  . pantoprazole  40 mg Oral BID  . simvastatin  20 mg Oral q1800  . sodium chloride flush  3 mL Intravenous Q12H  . vitamin B-12  1,000 mcg Oral Daily   Continuous Infusions: . sodium  chloride     PRN Meds:.acetaminophen **OR** acetaminophen, albuterol, bisacodyl, HYDROmorphone (DILAUDID) injection, ondansetron **OR** ondansetron (ZOFRAN) IV, polyethylene glycol  Micro Results No results found for this or any previous visit (from the past 240 hour(s)).  Radiology Reports Dg Chest 2 View  Result Date: 05/20/2016 CLINICAL DATA:  83 y/o M; status post fall onto back striking the right mid thorax. EXAM: CHEST  2 VIEW THORACIC SPINE TWO OR MORE VIEWS COMPARISON:  11/27/2015 chest radiograph FINDINGS: Chest radiograph: Normal cardiac silhouette. Aortic atherosclerosis with calcification. Clear lungs. No pleural effusion or pneumothorax. No acute rib fracture identified. Mild interval anterior loss of height of L1 vertebral body. Thoracic spine radiograph: Mild interval age-indeterminate loss of the L1 vertebral body. Mild lower thoracic dextrocurvature. The advanced degenerative changes of the cervicothoracic junction. IMPRESSION: 1. Mild interval anterior loss of height of the L1 vertebral body, age indeterminate compression fracture. 2. No acute pulmonary process. Electronically Signed   By: Kristine Garbe M.D.   On: 05/20/2016 19:22   Dg Thoracic Spine 2 View  Result Date: 05/20/2016 CLINICAL DATA:  83 y/o M; status post fall onto back striking the right mid thorax. EXAM: CHEST  2 VIEW THORACIC SPINE TWO OR MORE VIEWS COMPARISON:  11/27/2015 chest radiograph FINDINGS: Chest radiograph: Normal cardiac silhouette. Aortic atherosclerosis with calcification. Clear lungs. No pleural effusion or pneumothorax. No acute rib fracture identified. Mild interval anterior loss of height of L1 vertebral body. Thoracic spine radiograph: Mild interval age-indeterminate loss of the L1 vertebral body. Mild lower thoracic dextrocurvature. The advanced degenerative changes of the cervicothoracic junction. IMPRESSION: 1. Mild interval anterior loss of height of the L1 vertebral body, age  indeterminate compression fracture. 2. No acute pulmonary process. Electronically Signed   By: Kristine Garbe M.D.   On: 05/20/2016 19:22   Mr Lumbar Spine Wo Contrast  Result Date: 05/21/2016 CLINICAL DATA:  Patient with pain. Dementia. Recent fall onto back, striking the mid thorax. EXAM: MRI LUMBAR SPINE WITHOUT CONTRAST TECHNIQUE: Multiplanar, multisequence MR  imaging of the lumbar spine was performed. No intravenous contrast was administered. COMPARISON:  Thoracic radiographs 05/20/2016. FINDINGS: The patient was unable to remain motionless for the exam. Small or subtle lesions could be overlooked. Segmentation:  Standard. Alignment:  3 mm of anterolisthesis L4-5, facet mediated. Vertebrae: Acute compression fracture of L1, bone marrow edema along a transverse plane through the superior aspect of the vertebral body. No involvement of the pedicles or posterior elements. Superior endplate depression, without significant anterior loss of vertebral body height. No retropulsion. LEFT greater than RIGHT paravertebral hematoma extends cephalad along the lateral margin of T12. No epidural hematoma. No other vertebral body compression fracture is observed. Conus medullaris: Extends to the L1 level and appears normal. No evidence for conus contusion. Paraspinal and other soft tissues: Unremarkable. Disc levels: L1-L2:  Unremarkable. L2-L3:  Unremarkable. L3-L4:  Unremarkable. L4-L5: 3 mm anterolisthesis is facet mediated. There is a superimposed broad-based disc protrusion contributing to mild stenosis. Either L5 nerve root could be affected. No significant foraminal narrowing. L5-S1: Disc space narrowing. Central protrusion. Posterior element hypertrophy. No impingement. IMPRESSION: Findings consistent with a benign, osteopenic, posttraumatic L1 fracture, primarily superior endplate depression. The appearance is stable compared with yesterday's plain films. The patient may be a candidate for vertebral  augmentation. Please consult Interventional Radiology if clinically indicated. Degenerative anterolisthesis of 3 mm at L4-5, compounded by a broad-based disc protrusion contributing to mild stenosis. Either L5 nerve root could be affected. Electronically Signed   By: Staci Righter M.D.   On: 05/21/2016 12:37    Time Spent in minutes  30   Jani Gravel M.D on 05/22/2016 at 10:34 AM  Between 7am to 7pm - Pager - [phone]  After 7pm go to [email or web address] - password [facility]  [facility] -  Office  [phone]

## 2016-05-22 NOTE — Evaluation (Signed)
Physical Therapy Evaluation Patient Details Name: Chad [name] MRN: [id number] DOB: 09/24/1929 Today's Date: 05/22/2016   History of Present Illness  Patient is an 83 yo male admitted 05/20/16 after a fall.  MRI showed L1 compression fx.    PMH:  DM, COPD, HTN, CHF, CVA with Lt weakness, dementia, depression  Clinical Impression  Patient presents with problems listed below.  Will benefit from acute PT to maximize functional mobility prior to discharge.  Patient requiring max-total assist for bed mobility and sitting.  Recommend SNF at d/c for continued therapy.    Follow Up Recommendations SNF;Supervision/Assistance - 24 hour    Equipment Recommendations  Wheelchair (measurements PT);Wheelchair cushion (measurements PT)    Recommendations for Other Services       Precautions / Restrictions Precautions Precautions: Fall Precaution Comments: History of falls at home Restrictions Weight Bearing Restrictions: No      Mobility  Bed Mobility Overal bed mobility: Needs Assistance Bed Mobility: Rolling;Sidelying to Sit;Sit to Supine Rolling: Max assist Sidelying to sit: Total assist;HOB elevated   Sit to supine: Total assist   General bed mobility comments: Verbal and tactile cues to roll.  Patient unable to initiate movement.  Assisted UE to reach for rail.  Patient did not hold onto rail.  Max assist to move to side.  Total assist to move to sitting.  Once in sitting, required mod-max assist to maintain static sitting balance.  Did not attempt to use UE's to assist with balance.  Returned to supine with total assist.  Transfers                 General transfer comment: Unable  Ambulation/Gait                Stairs            Wheelchair Mobility    Modified Rankin (Stroke Patients Only)       Balance Overall balance assessment: Needs assistance;History of Falls Sitting-balance support: No upper extremity supported;Feet supported Sitting  balance-Leahy Scale: Zero                                       Pertinent Vitals/Pain Pain Assessment: Faces Faces Pain Scale: Hurts little more Pain Location: back - in sitting Pain Descriptors / Indicators: Grimacing Pain Intervention(s): Limited activity within patient's tolerance;Repositioned    Home Living Family/patient expects to be discharged to:: Private residence Living Arrangements: Spouse/significant other Available Help at Discharge: Family;Available 24 hours/day Type of Home: House Home Access: Stairs to enter Entrance Stairs-Rails: Psychiatric nurse of Steps: 1 Home Layout: Multi-level Home Equipment: Walker - 2 wheels;Cane - single point;Toilet riser Additional Comments: Patient unable to provide home living information.  Obtained from medical record from prior admission.    Prior Function Level of Independence: Needs assistance   Gait / Transfers Assistance Needed: Pt ambulating with RW and increassing level of assist for short distances  ADL's / Homemaking Assistance Needed: Wife helped with dressing due to confusion/difficulty problem solving  Comments: Patient unable to provide PLOF information.  Obtained from medical record from prior admission     Hand Dominance   Dominant Hand: Right    Extremity/Trunk Assessment   Upper Extremity Assessment Upper Extremity Assessment: Generalized weakness;LUE deficits/detail;Difficult to assess due to impaired cognition;RUE deficits/detail RUE Deficits / Details: Did not move RUE to command.  Did note spontaneous movement against gravity. LUE Deficits /  Details: Minimal active movement noted LUE, less than RUE.    Lower Extremity Assessment Lower Extremity Assessment: RLE deficits/detail;LLE deficits/detail;Difficult to assess due to impaired cognition RLE Deficits / Details: Did not move to command.  Did move spontaneously, bending LE up to hook-lying position. LLE Deficits /  Details: Did not move to command.  Did move spontaneously, bending LE up to hook-lying position.    Cervical / Trunk Assessment Cervical / Trunk Assessment: Other exceptions Cervical / Trunk Exceptions: Minimal cervical movement in sitting.  Communication   Communication: Expressive difficulties;HOH (Patient mumbling responses)  Cognition Arousal/Alertness: Lethargic Behavior During Therapy: Flat affect (Minimal responses) Overall Cognitive Status: No family/caregiver present to determine baseline cognitive functioning                                        General Comments      Exercises     Assessment/Plan    PT Assessment Patient needs continued PT services  PT Problem List Decreased strength;Decreased range of motion;Decreased activity tolerance;Decreased balance;Decreased mobility;Decreased cognition;Decreased knowledge of use of DME;Decreased safety awareness;Pain       PT Treatment Interventions Functional mobility training;Therapeutic activities;Balance training;Neuromuscular re-education;Cognitive remediation;Patient/family education    PT Goals (Current goals can be found in the Care Plan section)  Acute Rehab PT Goals Patient Stated Goal: Patient unable to state PT Goal Formulation: Patient unable to participate in goal setting Time For Goal Achievement: 05/29/16 Potential to Achieve Goals: Fair    Frequency Min 3X/week   Barriers to discharge Decreased caregiver support Do not feel wife can provide assistance that patient needs at this time.    Co-evaluation               End of Session   Activity Tolerance: Patient limited by lethargy;Patient limited by pain Patient left: in bed;with call bell/phone within reach;with bed alarm set   PT Visit Diagnosis: Other abnormalities of gait and mobility (R26.89);History of falling (Z91.81);Muscle weakness (generalized) (M62.81);Pain Pain - part of body:  (back)    Time: 1557-1610 PT Time  Calculation (min) (ACUTE ONLY): 13 min   Charges:   PT Evaluation $PT Eval Moderate Complexity: 1 Procedure     PT G Codes:        Carita Pian. Sanjuana Kava, [facility] Acute Rehab Services Pager [place] 05/22/2016, 4:45 PM

## 2016-05-23 ENCOUNTER — Ambulatory Visit: Payer: PPO | Admitting: Internal Medicine

## 2016-05-23 LAB — CBC
HCT: 33.7 % — ABNORMAL LOW (ref 39.0–52.0)
Hemoglobin: 10.9 g/dL — ABNORMAL LOW (ref 13.0–17.0)
MCH: 27.7 pg (ref 26.0–34.0)
MCHC: 32.3 g/dL (ref 30.0–36.0)
MCV: 85.8 fL (ref 78.0–100.0)
PLATELETS: 161 10*3/uL (ref 150–400)
RBC: 3.93 MIL/uL — AB (ref 4.22–5.81)
RDW: 15.8 % — AB (ref 11.5–15.5)
WBC: 7.5 10*3/uL (ref 4.0–10.5)

## 2016-05-23 LAB — COMPREHENSIVE METABOLIC PANEL
ALBUMIN: 2.8 g/dL — AB (ref 3.5–5.0)
ALT: 15 U/L — AB (ref 17–63)
AST: 14 U/L — AB (ref 15–41)
Alkaline Phosphatase: 50 U/L (ref 38–126)
Anion gap: 7 (ref 5–15)
BILIRUBIN TOTAL: 0.7 mg/dL (ref 0.3–1.2)
BUN: 15 mg/dL (ref 6–20)
CHLORIDE: 96 mmol/L — AB (ref 101–111)
CO2: 27 mmol/L (ref 22–32)
CREATININE: 1.08 mg/dL (ref 0.61–1.24)
Calcium: 9.1 mg/dL (ref 8.9–10.3)
GFR calc Af Amer: >60 mL/min (ref >60)
GLUCOSE: 113 mg/dL — AB (ref 65–99)
POTASSIUM: 4.2 mmol/L (ref 3.5–5.1)
Sodium: 130 mmol/L — ABNORMAL LOW (ref 135–145)
Total Protein: 5.4 g/dL — ABNORMAL LOW (ref 6.5–8.1)

## 2016-05-23 LAB — GLUCOSE, CAPILLARY
GLUCOSE-CAPILLARY: 118 mg/dL — AB (ref 65–99)
GLUCOSE-CAPILLARY: 116 mg/dL — AB (ref 65–99)
GLUCOSE-CAPILLARY: 119 mg/dL — AB (ref 65–99)
GLUCOSE-CAPILLARY: 142 mg/dL — AB (ref 65–99)
Glucose-Capillary: 119 mg/dL — ABNORMAL HIGH (ref 65–99)

## 2016-05-23 LAB — PROTIME-INR
INR: 1.06
Prothrombin Time: 13.9 seconds (ref 11.4–15.2)

## 2016-05-23 LAB — APTT: aPTT: 32 seconds (ref 24–36)

## 2016-05-23 NOTE — Clinical Social Work Note (Signed)
Clinical Social Work Assessment  Patient Details  Name: Chad [name] MRN: [id number] Date of Birth: 23-Apr-1929  Date of referral:  05/23/16               Reason for consult:  Facility Placement, Discharge Planning                Permission sought to share information with:  Facility Sport and exercise psychologist, Family Supports Permission granted to share information::  Yes, Verbal Permission Granted  Name::     Sport and exercise psychologist::  SNF  Relationship::  Wife  Contact Information:     Housing/Transportation Living arrangements for the past 2 months:  Single Family Home Source of Information:  Spouse Patient Interpreter Needed:  None Criminal Activity/Legal Involvement Pertinent to Current Situation/Hospitalization:  No - Comment as needed Significant Relationships:  Spouse Lives with:  Self, Spouse Do you feel safe going back to the place where you live?  No Need for family participation in patient care:  Yes (Comment) (pt has dementia)  Care giving concerns:  Prior to current tx episode, pt was living at home with his wife and having home health provide PT, OT, and speech therapy. Pt has increased mobility concerns after most recent fall. Pt's wife is not able to provide increased level of care needed.    Social Worker assessment / plan:  CSW explained SNF placement recommendation to pt's wife. Pt's wife acknowledged previous SNF placement in February at [facility], and requested a return to that facility if possible. Pt's wife explained how pt's fully covered Medicare days have been used up from previous placement, and they do not have the money to pay for any out-of-pocket costs. Pt's wife reported that pt goes to the [facility] in [place], and provided contact information for the pt's doctor and social worker to request information about any possible VA benefits for SNF placement.  Employment status:  Retired Nurse, adult PT Recommendations:  [facility] / Referral to community resources:  [place]  Patient/Family's Response to care:  Pt's wife acknowledged that previous SNF placement was helpful in returning pt to previous level of functioning. Pt's wife is hopeful that short term rehab stay will help the pt be able to successfully return home.  Patient/Family's Understanding of and Emotional Response to Diagnosis, Current Treatment, and Prognosis:  Pt's wife is aware of increased needs and understanding of her limitations in being able to provide care. Pt's wife was concerned about pt's continued setbacks, and seemed discouraged about the increased level of care required for the pt to function in simple daily tasks.  Emotional Assessment Appearance:  Appears stated age Attitude/Demeanor/Rapport:    Affect (typically observed):    Orientation:  Oriented to Self Alcohol / Substance use:  Not Applicable Psych involvement (Current and /or in the community):  No (Comment)  Discharge Needs  Concerns to be addressed:  Discharge Planning Concerns, Care Coordination Readmission within the last 30 days:  No Current discharge risk:  Physical Impairment Barriers to Discharge:  Continued Medical Work up   [employer], [place] 05/23/2016, 11:05 AM

## 2016-05-23 NOTE — Progress Notes (Signed)
Low bed ordered for the patient. 

## 2016-05-23 NOTE — Progress Notes (Addendum)
Patient ID: Chad [name], male   DOB: August 17, 1929, 83 y.o.   MRN: [id number]   Request for Lumbar 1 VP/KP in IR  Dr Estanislado Pandy has reviewed imaging and approves procedure.  Must be opff Plavix 5 days before procedure I will discuss with MD.  Insurance is in process of pre certification now.  Discussed with Dr Starla Link Pt has had Plavix today Would be May 7 before we could plan for procedure LD Plavix today----5 days off would land on weekend---(could schedule for Mon at earliest)  Can be done as OP Dr Starla Link discussing with Case Mngt

## 2016-05-23 NOTE — Consult Note (Signed)
   [facility] CM Inpatient Consult   05/23/2016  Chad [name] [birth year] [id number]   Patient assess for 3 admission and 2 ED visits in the past 6 months. Patient is a beneficiary of the [place].  Chart review reveals the patient is an 83 yo male admitted 05/20/16 after a fall.  MRI showed L1 compression fx.    PMH:  DM, COPD, HTN, CHF, CVA with Lt weakness, dementia, and depression.   Currently, PT is recommending a skilled facility stay for short term rehab. Patient's primary care provider is Pricilla Holm and some visit at the [facility] in [place].  Went to the patient's room, patient able to say full name and DOB.  Patient's wife was not in the room at this time.  A brochure, magnet and [facility] Care Management information with contact. Chart review reveals the patient is currently for skilled nursing facility placement. Inpatient RNCM aware of [facility].  For questions, please contact:  Natividad Brood, RN BSN [facility] Liaison  [phone] business mobile phone Toll free office [phone]

## 2016-05-23 NOTE — Progress Notes (Signed)
Physical Therapy Treatment Patient Details Name: Chad [name] MRN: [id number] DOB: [birth year] Today's Date: 05/23/2016    History of Present Illness Patient is an 83 yo male admitted 05/20/16 after a fall.  MRI showed L1 compression fx.    PMH:  DM, COPD, HTN, CHF, CVA with Lt weakness, dementia, depression    PT Comments    Pt able to sit edge of bed x 7 minutes this session with supervision to total A due to posterior LOB.  Pt limited by weakness and fatigue, continuing to require max-total A for all mobility.  PT strongly recommending SNF due to burden of care.   Follow Up Recommendations  SNF;Supervision/Assistance - 24 hour     Equipment Recommendations  Wheelchair (measurements PT);Wheelchair cushion (measurements PT)    Recommendations for Other Services       Precautions / Restrictions Precautions Precautions: Fall Restrictions Weight Bearing Restrictions: No    Mobility  Bed Mobility Overal bed mobility: Needs Assistance   Rolling: Max assist Sidelying to sit: Max assist   Sit to supine: Total assist   General bed mobility comments: pt able to flex LEs to roll, requires hand over hand assist for UE to grab bedrail and max A at trunk and plevis to roll. max A at trunk to sit upright and to assist LEs off of bed. total A to return to supine  Transfers                 General transfer comment: Unable  Ambulation/Gait                 Stairs            Wheelchair Mobility    Modified Rankin (Stroke Patients Only)       Balance   Sitting-balance support: Bilateral upper extremity supported;Feet supported Sitting balance-Leahy Scale: Poor                                      Cognition Arousal/Alertness: Awake/alert Behavior During Therapy: Flat affect Overall Cognitive Status: History of cognitive impairments - at baseline                                        Exercises General Exercises -  Lower Extremity Long Arc Quad: 10 reps;Both;Seated (with mod/max A for sitting balance)    General Comments General comments (skin integrity, edema, etc.): sat edge of bed x 7 minutes with fluctuating level of assist from superivsion to total A. pt tends to fall backwards and requires total A to correct      Pertinent Vitals/Pain Pain Assessment: Faces Faces Pain Scale: Hurts little more Pain Location: back - in sitting Pain Descriptors / Indicators: Grimacing Pain Intervention(s): Limited activity within patient's tolerance;Monitored during session;Repositioned    Home Living                      Prior Function            PT Goals (current goals can now be found in the care plan section) Acute Rehab PT Goals Patient Stated Goal: Patient unable to state PT Goal Formulation: Patient unable to participate in goal setting Time For Goal Achievement: 05/29/16 Potential to Achieve Goals: Fair Progress towards PT goals: Progressing toward goals    Frequency  Min 3X/week      PT Plan Current plan remains appropriate    Co-evaluation              AM-PAC PT "6 Clicks" Daily Activity  Outcome Measure  Difficulty turning over in bed (including adjusting bedclothes, sheets and blankets)?: Total Difficulty moving from lying on back to sitting on the side of the bed? : Total Difficulty sitting down on and standing up from a chair with arms (e.g., wheelchair, bedside commode, etc,.)?: Total Help needed moving to and from a bed to chair (including a wheelchair)?: Total Help needed walking in hospital room?: Total Help needed climbing 3-5 steps with a railing? : Total 6 Click Score: 6    End of Session   Activity Tolerance: Patient limited by lethargy;Patient limited by fatigue Patient left: in bed;with call bell/phone within reach;with family/visitor present;with bed alarm set Nurse Communication: Mobility status PT Visit Diagnosis: Other abnormalities of gait  and mobility (R26.89);History of falling (Z91.81);Muscle weakness (generalized) (M62.81);Pain     Time: 1410-1425 PT Time Calculation (min) (ACUTE ONLY): 15 min  Charges:  $Therapeutic Activity: 8-22 mins                    G Codes:       Isabelle Course, PT, DPT   DONAWERTH,KAREN 05/23/2016, 2:31 PM

## 2016-05-23 NOTE — Consult Note (Signed)
Chief Complaint: Patient was seen in consultation today for Lumbar 1 vertebroplasty/kyphoplasty Chief Complaint  Patient presents with  . Fall   at the request of Dr Cyndi Bender  Supervising Physician: Luanne Bras  Patient Status: [facility] - In-pt  History of Present Illness: Chad [name] is a 83 y.o. male   Admitted Fri 4/27 after fall in home. L1 acute fx per MRI Pain is severe especially if rolls to right Request form [facility] for possible VP/KP Dr Estanislado Pandy has reviewed imaging and approves procedure Pt is on Plavix actively; Last dose today 4/30 Will need off Plavix 5 days prior to procedure to safely perform Earliest can do procedure would be May 7- Monday. Insurance is in pre certification now.  Can be performed as OP if MD feels appropriate  Hx: Dementia; CVA; DM; COPD; esophagectomy and large abdominal hernia Admission PNA; hyponatremia    Past Medical History:  Diagnosis Date  . Acid reflux disease   . Arthritis    "all over"  . Arthritis   . Barrett's esophagus   . Basal cell carcinoma of nose   . CAROTID ARTERY STENOSIS, BILATERAL 08/27/2008  . Chronic bronchitis ([place])    "most q yr; he's had it several times" (12/20/2013)  . COPD (chronic obstructive pulmonary disease) ([place])    "severe" (12/20/2013)  . COPD (chronic obstructive pulmonary disease) ([place])   . Coronary artery stenosis   . Diabetes mellitus without complication ([place])   . Diaphragmatic hernia without mention of obstruction or gangrene   . Diverticulitis   . Diverticulosis of colon   . Dyspnea   . Esophageal stenosis   . Fatty liver disease, nonalcoholic   . GERD (gastroesophageal reflux disease)   . Glaucoma   . Glaucoma   . High cholesterol   . History of blood transfusion 2007   "related to OR"  . History of hiatal hernia    "repaired when esophagus removed"  . History of stomach ulcers   . HTN (hypertension)   . Hyperlipidemia   . Hypertension   . Kidney stones    "passed  them all" (12/20/2013)  . Memory loss    DEMENTIA  . Personal history of other malignant neoplasm of skin   . Pneumonia 1990's X 4  . RENAL CALCULUS, HX OF 08/28/2008  . Renal disorder   . Sinus headache    "seasonal"  . Stroke ([place])   . TIA (transient ischemic attack) 1998 X "a few"  . Type II diabetes mellitus ([place])     Past Surgical History:  Procedure Laterality Date  . BASAL CELL CARCINOMA EXCISION    . CAROTID ARTERY ANGIOPLASTY    . CAROTID ENDARTERECTOMY Bilateral 02/1996-03/1996  . CATARACT EXTRACTION W/ INTRAOCULAR LENS IMPLANT Bilateral 04/2007 - 09/2007   "left-right"  . ESOPHAGECTOMY  2007   with stomach pull through  . ESOPHAGOGASTRODUODENOSCOPY (EGD) WITH ESOPHAGEAL DILATION  10/2011  . EYE SURGERY Right 04/2006; 08/2007   "had gas bubbles put in"  . EYE SURGERY Right 01/2007   "air bubble"  . EYE SURGERY Left 12/12/2013   "cleaned his implant w/laser"  . EYE SURGERY    . GLAUCOMA SURGERY Right 11/2008   "implanted drain tube"  . HERNIA REPAIR    . KNEE ARTHROSCOPY Bilateral 04/1989; 08/1996; 06/1999   "right; left; left"  . KNEE SURGERY    . MOHS SURGERY  ?2002   "tip of nose; basal cell"  . NASAL POLYP EXCISION  2005   "  benign"  . PARS PLANA VITRECTOMY Bilateral 01/2007-05/2007  . SHOULDER ARTHROSCOPY Right 04/1998  . SHOULDER SURGERY    . VENTRAL HERNIA REPAIR  02/2006    Allergies: Moxifloxacin; Quinolones; Sulfonamide derivatives; Timolol maleate; Amoxicillin; Augmentin [amoxicillin-pot clavulanate]; and Sulfa antibiotics  Medications: Prior to Admission medications   Medication Sig Start Date End Date Taking? Authorizing Provider  ACETAMINOPHEN PO Take 650 mg by mouth every 6 (six) hours as needed for fever.    Yes Historical Provider, MD  albuterol (PROVENTIL HFA;VENTOLIN HFA) 108 (90 Base) MCG/ACT inhaler Inhale 2 puffs into the lungs every 4 (four) hours as needed for wheezing or shortness of breath.   Yes Historical Provider, MD  albuterol (PROVENTIL)  (2.5 MG/3ML) 0.083% nebulizer solution Take 2.5 mg by nebulization every 4 (four) hours as needed for wheezing or shortness of breath.   Yes Historical Provider, MD  amLODipine (NORVASC) 10 MG tablet TAKE 1 TABLET(10 MG) BY MOUTH DAILY 03/23/16  Yes Hoyt Koch, MD  budesonide-formoterol [facility]) 160-4.5 MCG/ACT inhaler Inhale 2 puffs into the lungs 2 (two) times daily.   Yes Historical Provider, MD  Cholecalciferol (VITAMIN D) 1000 UNITS capsule Take 1,000 Units by mouth daily.     Yes Historical Provider, MD  cloNIDine (CATAPRES) 0.2 MG tablet TAKE ONE-HALF TABLET BY MOUTH DAILY AS NEEDED ONLY IF SYSTOLIC IS OVER 562 01/26/06  Yes Hoyt Koch, MD  clopidogrel (PLAVIX) 75 MG tablet TAKE 1 TABLET BY MOUTH EVERY DAY 08/20/15  Yes Rosalin Hawking, MD  donepezil (ARICEPT) 10 MG tablet Take 1 tablet (10 mg total) by mouth at bedtime. 02/26/16  Yes Cameron Sprang, MD  furosemide (LASIX) 20 MG tablet Take 20 mg by mouth daily.   Yes Historical Provider, MD  latanoprost (XALATAN) 0.005 % ophthalmic solution Place 1 drop into the left eye 2 (two) times daily.   Yes Historical Provider, MD  losartan (COZAAR) 100 MG tablet Take 1 tablet (100 mg total) by mouth daily. 01/28/16  Yes Hoyt Koch, MD  metFORMIN (GLUCOPHAGE) 500 MG tablet Take 1 tablet (500 mg total) by mouth 2 (two) times daily with a meal. 02/01/16  Yes Hoyt Koch, MD  Multiple Vitamins-Minerals (PRESERVISION AREDS 2) CAPS Take 1 capsule by mouth 2 (two) times daily.   Yes Historical Provider, MD  omeprazole (PRILOSEC) 20 MG capsule Take 20 mg by mouth 2 (two) times daily before a meal.    Yes Historical Provider, MD  omeprazole (PRILOSEC) 20 MG capsule Take 20 mg by mouth 2 (two) times daily before a meal.   Yes Historical Provider, MD  PARoxetine (PAXIL) 20 MG tablet Take 1 tablet (20 mg total) by mouth daily. 03/07/16  Yes Hoyt Koch, MD  potassium chloride SA (K-DUR,KLOR-CON) 20 MEQ tablet Take 1 tablet (20  mEq total) by mouth daily. 01/27/16  Yes Hoyt Koch, MD  promethazine (PHENERGAN) 25 MG tablet Take 12.5 mg by mouth every 8 (eight) hours as needed for nausea or vomiting.    Yes Historical Provider, MD  simvastatin (ZOCOR) 20 MG tablet TAKE 1 TABLET BY MOUTH DAILY AT 6 PM 05/02/16  Yes Hoyt Koch, MD  vitamin B-12 (CYANOCOBALAMIN) 1000 MCG tablet Take 1,000 mcg by mouth daily.   Yes Historical Provider, MD  donepezil (ARICEPT) 10 MG tablet TAKE 1 TABLET(10 MG) BY MOUTH AT BEDTIME 05/23/16   Cameron Sprang, MD  LORazepam (ATIVAN) 0.5 MG tablet Take 1 tablet (0.5 mg total) by mouth at bedtime. 02/01/16  Hoyt Koch, MD     Family History  Problem Relation Age of Onset  . Colon cancer Mother   . Lung cancer Mother   . Heart attack Mother   . Stomach cancer Father   . Esophageal cancer Father   . Colon cancer Maternal Grandfather   . Heart attack Brother   . Heart attack Brother     Social History   Social History  . Marital status: Married    Spouse name: N/A  . Number of children: 2  . Years of education: N/A   Occupational History  . retired Automotive engineer    Social History Main Topics  . Smoking status: Former Smoker    Packs/day: 1.00    Years: 40.00    Types: Cigarettes    Quit date: 01/24/1978  . Smokeless tobacco: Never Used  . Alcohol use No  . Drug use: No  . Sexual activity: Not Currently   Other Topics Concern  . None   Social History Narrative   ** Merged History Encounter **        Review of Systems: A 12 point ROS discussed and pertinent positives are indicated in the HPI above.  All other systems are negative.  Review of Systems  Constitutional: Positive for activity change. Negative for fatigue.  Respiratory: Positive for shortness of breath. Negative for cough.   Gastrointestinal: Negative for abdominal pain.  Musculoskeletal: Positive for back pain and gait problem.  Neurological: Positive for weakness.    Psychiatric/Behavioral: Positive for confusion.    Vital Signs: BP (!) 132/59 (BP Location: Left Arm)   Pulse 62   Temp 98.3 F (36.8 C) (Oral)   Resp 18   Ht 5\' 9"  (1.753 m)   Wt 154 lb 9.6 oz (70.1 kg)   SpO2 93%   BMI 22.83 kg/m   Physical Exam  Cardiovascular: Normal rate and regular rhythm.   Pulmonary/Chest: Effort normal and breath sounds normal.  Abdominal: Soft. Bowel sounds are normal. He exhibits distension. There is no tenderness. A hernia is present.  Large abdominal hernia Can palpate deeply without pain  Musculoskeletal:  Able to move all 4s Barley moveable Pain in back is mostly with rolling to reposition pt.  Neurological:  Is eating with help from wife  Skin: Skin is warm and dry.  Psychiatric:  Wife at bedside She provides all communication  Nursing note and vitals reviewed.   Mallampati Score:  MD Evaluation Airway: WNL Heart: WNL Abdomen: WNL Chest/ Lungs: WNL ASA  Classification: 3 Mallampati/Airway Score: Two  Imaging: Dg Chest 2 View  Result Date: 05/22/2016 CLINICAL DATA:  Previous esophagectomy with gastric pull-through procedure. Chest pain EXAM: CHEST  2 VIEW COMPARISON:  May 20, 2016 FINDINGS: The mean L1 vertebral body fracture appear stable compared to recent prior study. There is soft tissue prominence posterior to the left heart, likely of postoperative etiology. There is no new opacity. No frank edema or consolidation. Heart size and pulmonary vascular normal. No adenopathy. There is aortic atherosclerosis. IMPRESSION: Opacity posterior to the left heart is likely due to previous gastric pull-through procedure. No frank edema or consolidation. Aortic atherosclerosis. Stable appearing anterior wedge fracture of the L1 vertebral body. Electronically Signed   By: Lowella Grip III M.D.   On: 05/22/2016 13:07   Dg Chest 2 View  Result Date: 05/20/2016 CLINICAL DATA:  83 y/o M; status post fall onto back striking the right mid  thorax. EXAM: CHEST  2 VIEW THORACIC SPINE  TWO OR MORE VIEWS COMPARISON:  11/27/2015 chest radiograph FINDINGS: Chest radiograph: Normal cardiac silhouette. Aortic atherosclerosis with calcification. Clear lungs. No pleural effusion or pneumothorax. No acute rib fracture identified. Mild interval anterior loss of height of L1 vertebral body. Thoracic spine radiograph: Mild interval age-indeterminate loss of the L1 vertebral body. Mild lower thoracic dextrocurvature. The advanced degenerative changes of the cervicothoracic junction. IMPRESSION: 1. Mild interval anterior loss of height of the L1 vertebral body, age indeterminate compression fracture. 2. No acute pulmonary process. Electronically Signed   By: Kristine Garbe M.D.   On: 05/20/2016 19:22   Dg Thoracic Spine 2 View  Result Date: 05/20/2016 CLINICAL DATA:  83 y/o M; status post fall onto back striking the right mid thorax. EXAM: CHEST  2 VIEW THORACIC SPINE TWO OR MORE VIEWS COMPARISON:  11/27/2015 chest radiograph FINDINGS: Chest radiograph: Normal cardiac silhouette. Aortic atherosclerosis with calcification. Clear lungs. No pleural effusion or pneumothorax. No acute rib fracture identified. Mild interval anterior loss of height of L1 vertebral body. Thoracic spine radiograph: Mild interval age-indeterminate loss of the L1 vertebral body. Mild lower thoracic dextrocurvature. The advanced degenerative changes of the cervicothoracic junction. IMPRESSION: 1. Mild interval anterior loss of height of the L1 vertebral body, age indeterminate compression fracture. 2. No acute pulmonary process. Electronically Signed   By: Kristine Garbe M.D.   On: 05/20/2016 19:22   Ct Chest W Contrast  Result Date: 05/22/2016 CLINICAL DATA:  SIADH EXAM: CT CHEST, ABDOMEN, AND PELVIS WITH CONTRAST TECHNIQUE: Multidetector CT imaging of the chest, abdomen and pelvis was performed following the standard protocol during bolus administration of  intravenous contrast. CONTRAST:  26mL ISOVUE-300 IOPAMIDOL (ISOVUE-300) INJECTION 61% COMPARISON:  05/21/2016. FINDINGS: CT CHEST FINDINGS Cardiovascular: Thoracic aorta demonstrates atherosclerotic calcifications without aneurysmal dilatation or dissection. The pulmonary artery as visualized is within normal limits. Coronary calcifications are seen. No significant cardiac enlargement is noted. Mediastinum/Nodes: No significant hilar or mediastinal adenopathy is noted. The thoracic inlet is within normal limits. Changes consistent with gastric pull-through. The residual esophagus is somewhat patulous. Lungs/Pleura: Biapical scarring is noted. Patchy ground-glass changes are noted in the right upper lobe posteriorly best seen on image number 56. Patchy infiltrative changes are noted within the right lower lobe as well. Some scarring is noted in the anterior aspect of the left upper lobe. Mild left basilar atelectasis is seen. Musculoskeletal: Degenerative changes of the thoracic spine are noted. No acute rib fracture is seen. CT ABDOMEN PELVIS FINDINGS Hepatobiliary: No focal liver abnormality is seen. No gallstones, gallbladder wall thickening, or biliary dilatation. Pancreas: The pancreas is predominately fatty infiltrated with the exception of the uncinate process. It is also somewhat superiorly displaced related to the gastric pull-through. Spleen: Normal in size without focal abnormality. Adrenals/Urinary Tract: The adrenal glands are within normal limits. Nonobstructing renal calculi are noted on the left. The largest of these is noted in the mid to upper pole measuring approximately 6 mm in greatest dimension. No obstructive changes are seen. The bladder is well distended. Few small right renal calculi are noted. Scattered renal cysts are seen. Stomach/Bowel: Stomach is within normal limits. Appendix appears normal. Scattered diverticular changes noted without diverticulitis. No evidence of bowel wall  thickening, distention, or inflammatory changes. Vascular/Lymphatic: Aortic atherosclerosis. No enlarged abdominal or pelvic lymph nodes. Reproductive: Prostate is unremarkable. Other: Changes consistent with prior hernia repair are noted. There remains some laxity of the abdominal wall in the midline. Musculoskeletal: Degenerative changes of lumbar spine are seen. Additionally at  L1 compression deformity is noted similar to that seen on recent MRI. IMPRESSION: Patchy parenchymal changes within the right lung particularly within the lower lobe. The changes in the right lower lobe a likely related to acute inflammatory change. Short-term followup following appropriate therapy is recommended. This could be accomplished with a noncontrast CT. Changes consistent with prior esophagectomy and gastric pull through. L1 compression deformity similar to that seen on recent MRI. No focal neoplasm is noted. Diverticulosis without diverticulitis. Bilateral nonobstructing renal stones worse on the left than the right. Electronically Signed   By: Inez Catalina M.D.   On: 05/22/2016 18:21   Mr Lumbar Spine Wo Contrast  Result Date: 05/21/2016 CLINICAL DATA:  Patient with pain. Dementia. Recent fall onto back, striking the mid thorax. EXAM: MRI LUMBAR SPINE WITHOUT CONTRAST TECHNIQUE: Multiplanar, multisequence MR imaging of the lumbar spine was performed. No intravenous contrast was administered. COMPARISON:  Thoracic radiographs 05/20/2016. FINDINGS: The patient was unable to remain motionless for the exam. Small or subtle lesions could be overlooked. Segmentation:  Standard. Alignment:  3 mm of anterolisthesis L4-5, facet mediated. Vertebrae: Acute compression fracture of L1, bone marrow edema along a transverse plane through the superior aspect of the vertebral body. No involvement of the pedicles or posterior elements. Superior endplate depression, without significant anterior loss of vertebral body height. No retropulsion.  LEFT greater than RIGHT paravertebral hematoma extends cephalad along the lateral margin of T12. No epidural hematoma. No other vertebral body compression fracture is observed. Conus medullaris: Extends to the L1 level and appears normal. No evidence for conus contusion. Paraspinal and other soft tissues: Unremarkable. Disc levels: L1-L2:  Unremarkable. L2-L3:  Unremarkable. L3-L4:  Unremarkable. L4-L5: 3 mm anterolisthesis is facet mediated. There is a superimposed broad-based disc protrusion contributing to mild stenosis. Either L5 nerve root could be affected. No significant foraminal narrowing. L5-S1: Disc space narrowing. Central protrusion. Posterior element hypertrophy. No impingement. IMPRESSION: Findings consistent with a benign, osteopenic, posttraumatic L1 fracture, primarily superior endplate depression. The appearance is stable compared with yesterday's plain films. The patient may be a candidate for vertebral augmentation. Please consult Interventional Radiology if clinically indicated. Degenerative anterolisthesis of 3 mm at L4-5, compounded by a broad-based disc protrusion contributing to mild stenosis. Either L5 nerve root could be affected. Electronically Signed   By: Staci Righter M.D.   On: 05/21/2016 12:37   Ct Abdomen Pelvis W Contrast  Result Date: 05/22/2016 CLINICAL DATA:  SIADH EXAM: CT CHEST, ABDOMEN, AND PELVIS WITH CONTRAST TECHNIQUE: Multidetector CT imaging of the chest, abdomen and pelvis was performed following the standard protocol during bolus administration of intravenous contrast. CONTRAST:  81mL ISOVUE-300 IOPAMIDOL (ISOVUE-300) INJECTION 61% COMPARISON:  05/21/2016. FINDINGS: CT CHEST FINDINGS Cardiovascular: Thoracic aorta demonstrates atherosclerotic calcifications without aneurysmal dilatation or dissection. The pulmonary artery as visualized is within normal limits. Coronary calcifications are seen. No significant cardiac enlargement is noted. Mediastinum/Nodes: No  significant hilar or mediastinal adenopathy is noted. The thoracic inlet is within normal limits. Changes consistent with gastric pull-through. The residual esophagus is somewhat patulous. Lungs/Pleura: Biapical scarring is noted. Patchy ground-glass changes are noted in the right upper lobe posteriorly best seen on image number 56. Patchy infiltrative changes are noted within the right lower lobe as well. Some scarring is noted in the anterior aspect of the left upper lobe. Mild left basilar atelectasis is seen. Musculoskeletal: Degenerative changes of the thoracic spine are noted. No acute rib fracture is seen. CT ABDOMEN PELVIS FINDINGS Hepatobiliary: No focal liver  abnormality is seen. No gallstones, gallbladder wall thickening, or biliary dilatation. Pancreas: The pancreas is predominately fatty infiltrated with the exception of the uncinate process. It is also somewhat superiorly displaced related to the gastric pull-through. Spleen: Normal in size without focal abnormality. Adrenals/Urinary Tract: The adrenal glands are within normal limits. Nonobstructing renal calculi are noted on the left. The largest of these is noted in the mid to upper pole measuring approximately 6 mm in greatest dimension. No obstructive changes are seen. The bladder is well distended. Few small right renal calculi are noted. Scattered renal cysts are seen. Stomach/Bowel: Stomach is within normal limits. Appendix appears normal. Scattered diverticular changes noted without diverticulitis. No evidence of bowel wall thickening, distention, or inflammatory changes. Vascular/Lymphatic: Aortic atherosclerosis. No enlarged abdominal or pelvic lymph nodes. Reproductive: Prostate is unremarkable. Other: Changes consistent with prior hernia repair are noted. There remains some laxity of the abdominal wall in the midline. Musculoskeletal: Degenerative changes of lumbar spine are seen. Additionally at L1 compression deformity is noted similar to  that seen on recent MRI. IMPRESSION: Patchy parenchymal changes within the right lung particularly within the lower lobe. The changes in the right lower lobe a likely related to acute inflammatory change. Short-term followup following appropriate therapy is recommended. This could be accomplished with a noncontrast CT. Changes consistent with prior esophagectomy and gastric pull through. L1 compression deformity similar to that seen on recent MRI. No focal neoplasm is noted. Diverticulosis without diverticulitis. Bilateral nonobstructing renal stones worse on the left than the right. Electronically Signed   By: Inez Catalina M.D.   On: 05/22/2016 18:21    Labs:  CBC:  Recent Labs  05/20/16 1846 05/21/16 0538 05/22/16 0915 05/23/16 0408  WBC 10.8* 7.5 13.9* 7.5  HGB 12.3* 11.9* 12.4* 10.9*  HCT 37.3* 35.9* 37.9* 33.7*  PLT 210 171 176 161    COAGS:  Recent Labs  06/01/15 1844 05/23/16 0913  INR 1.21 1.06  APTT  --  32    BMP:  Recent Labs  05/21/16 0538 05/21/16 1400 05/22/16 0915 05/23/16 0408  NA 130* 128* 128* 130*  K 4.4 4.6 4.2 4.2  CL 95* 93* 94* 96*  CO2 25 24 23 27   GLUCOSE 121* 124* 183* 113*  BUN 18 15 16 15   CALCIUM 9.5 9.6 9.5 9.1  CREATININE 1.17 1.12 1.17 1.08  GFRNONAA 55* 58* 55* >60  GFRAA >60 >60 >60 >60    LIVER FUNCTION TESTS:  Recent Labs  03/19/16 1132 05/20/16 1846 05/22/16 0915 05/23/16 0408  BILITOT 0.8 0.5 0.9 0.7  AST 19 24 15  14*  ALT 19 22 16* 15*  ALKPHOS 79 76 63 50  PROT 7.8 6.5 6.2* 5.4*  ALBUMIN 4.6 3.8 3.3* 2.8*    TUMOR MARKERS: No results for input(s): AFPTM, CEA, CA199, CHROMGRNA in the last 8760 hours.  Assessment and Plan:  Severely painful Lumbar 1 compression fracture Amenable to vertebroplasty/kyphoplasty On Plavix now--Last dose just today Will need off 5 days MD aware. Can plan for possible Mon 5/7; can be performed as OP  Please let IR know if inpt or outpt status. - to be able to scheduled  appropriately. Risks and Benefits discussed with the patient's wife including, but not limited to education regarding the natural healing process of compression fractures without intervention, bleeding, infection, cement migration which may cause spinal cord damage, paralysis, pulmonary embolism or even death. All of her  questions were answered, she is agreeable to proceed.  Thank you  for this interesting consult.  I greatly enjoyed meeting Chad [name] and look forward to participating in their care.  A copy of this report was sent to the requesting provider on this date.  Electronically Signed: Monia Sabal A 05/23/2016, 1:57 PM   I spent a total of 40 Minutes    in face to face in clinical consultation, greater than 50% of which was counseling/coordinating care for Lumbar 1 VP/KP

## 2016-05-23 NOTE — NC FL2 (Signed)
[place] LEVEL OF CARE SCREENING TOOL     IDENTIFICATION  Patient Name: Chad [name] Birthdate: July 08, 1929 Sex: male Admission Date (Current Location): 05/20/2016  [facility] [place] Number:  Herbalist and Address:  [facility], [place] [address] [place], [place] [address]      Provider Number: [id number]  Attending Physician Name and Address:  Aline August, MD  Relative Name and Phone Number:       Current Level of Care: Hospital Recommended Level of Care: [place] Prior Approval Number:    Date Approved/Denied:   PASRR Number: [id number]  Discharge Plan: SNF    Current Diagnoses: Patient Active Problem List   Diagnosis Date Noted  . Pressure injury of skin 05/21/2016  . Hyponatremia 05/20/2016  . Fall at home, initial encounter 05/20/2016  . Closed compression fracture of L1 lumbar vertebra ([place]) 05/20/2016  . Chronic diastolic CHF (congestive heart failure) ([place]) 05/20/2016  . Anemia 02/27/2016  . History of stroke   . Hyperlipidemia   . General weakness   . Pseudobulbar affect 07/02/2015  . Controlled diabetes mellitus type 2 with complications ([place]) 32/44/0102  . GERD without esophagitis 06/12/2015  . Cerebral thrombosis with cerebral infarction 06/05/2015  . Urinary incontinence 04/13/2015  . Moderate dementia with behavioral disturbance 03/09/2015  . Depression 03/09/2015  . Legal blindness [place] 08/07/2014  . Routine general medical examination at a health care facility 04/24/2014  . Carotid stenosis 02/24/2014  . Barrett's esophagus 10/11/2011  . FATTY LIVER DISEASE 08/27/2008  . Glaucoma 05/20/2008  . Essential hypertension 05/20/2008  . COPD GOLD II  05/20/2008  . ARTHRITIS 05/20/2008    Orientation RESPIRATION BLADDER Height & Weight     Self  O2 ([place], 1L) External catheter Weight: 154 lb 9.6 oz (70.1 kg) Height:  5\' 9"  (175.3 cm)  BEHAVIORAL SYMPTOMS/MOOD NEUROLOGICAL  BOWEL NUTRITION STATUS      Incontinent    AMBULATORY STATUS COMMUNICATION OF NEEDS Skin   Extensive Assist Verbally PU Stage and Appropriate Care PU Stage 1 Dressing: No Dressing                     Personal Care Assistance Level of Assistance  Bathing, Dressing Bathing Assistance: Maximum assistance   Dressing Assistance: Maximum assistance     Functional Limitations Info             SPECIAL CARE FACTORS FREQUENCY  PT (By licensed PT), OT (By licensed OT)     PT Frequency: 5x/wk OT Frequency: 5x/wk            Contractures      Additional Factors Info  Code Status, Allergies, Psychotropic, Insulin Sliding Scale Code Status Info: DNR Allergies Info: Moxifloxacin, Quinolones, Sulfonamide Derivatives, Timolol Maleate, Amoxicillin, Augmentin Amoxicillin-pot Clavulanate, Sulfa Antibiotics Psychotropic Info: Lorazepam 0.5mg , Aricept 10mg  Insulin Sliding Scale Info: 3x/day       Current Medications (05/23/2016):  This is the current hospital active medication list Current Facility-Administered Medications  Medication Dose Route Frequency Provider Last Rate Last Dose  . acetaminophen (TYLENOL) tablet 650 mg  650 mg Oral Q6H PRN Vianne Bulls, MD       Or  . acetaminophen (TYLENOL) suppository 650 mg  650 mg Rectal Q6H PRN Ilene Qua Opyd, MD      . albuterol (PROVENTIL) (2.5 MG/3ML) 0.083% nebulizer solution 2.5 mg  2.5 mg Nebulization Q4H PRN Vianne Bulls, MD      .  amLODipine (NORVASC) tablet 10 mg  10 mg Oral Daily Vianne Bulls, MD   10 mg at 05/23/16 0925  . azithromycin (ZITHROMAX) 500 mg in dextrose 5 % 250 mL IVPB  500 mg Intravenous Q24H Jani Gravel, MD   Stopped at 05/22/16 2234  . bisacodyl (DULCOLAX) EC tablet 5 mg  5 mg Oral Daily PRN Vianne Bulls, MD      . cefTRIAXone (ROCEPHIN) 1 g in dextrose 5 % 50 mL IVPB  1 g Intravenous Q24H Jani Gravel, MD   Stopped at 05/22/16 2134  . cholecalciferol (VITAMIN D) tablet 1,000 Units  1,000 Units Oral Daily  Vianne Bulls, MD   1,000 Units at 05/23/16 0925  . cloNIDine (CATAPRES) tablet 0.1 mg  0.1 mg Oral Daily Vianne Bulls, MD   0.1 mg at 05/23/16 0925  . clopidogrel (PLAVIX) tablet 75 mg  75 mg Oral Daily Vianne Bulls, MD   75 mg at 05/23/16 0925  . donepezil (ARICEPT) tablet 10 mg  10 mg Oral QHS Vianne Bulls, MD   10 mg at 05/22/16 2139  . HYDROmorphone (DILAUDID) injection 1 mg  1 mg Intravenous Q4H PRN Jani Gravel, MD   1 mg at 05/21/16 1122  . insulin aspart (novoLOG) injection 0-5 Units  0-5 Units Subcutaneous QHS Timothy S Opyd, MD      . insulin aspart (novoLOG) injection 0-9 Units  0-9 Units Subcutaneous TID WC Vianne Bulls, MD   1 Units at 05/22/16 1816  . iopamidol (ISOVUE-300) 61 % injection 30 mL  30 mL Intravenous Once PRN Jani Gravel, MD      . latanoprost (XALATAN) 0.005 % ophthalmic solution 1 drop  1 drop Left Eye BID Vianne Bulls, MD   1 drop at 05/23/16 0924  . LORazepam (ATIVAN) tablet 0.5 mg  0.5 mg Oral QHS Vianne Bulls, MD      . mometasone-formoterol [facility]) 200-5 MCG/ACT inhaler 2 puff  2 puff Inhalation BID Ricka Burdock, RPH   2 puff at 05/23/16 6962  . ondansetron (ZOFRAN) tablet 4 mg  4 mg Oral Q6H PRN Vianne Bulls, MD       Or  . ondansetron (ZOFRAN) injection 4 mg  4 mg Intravenous Q6H PRN Vianne Bulls, MD      . pantoprazole (PROTONIX) EC tablet 40 mg  40 mg Oral BID Vianne Bulls, MD   40 mg at 05/23/16 0925  . polyethylene glycol (MIRALAX / GLYCOLAX) packet 17 g  17 g Oral Daily PRN Vianne Bulls, MD      . saccharomyces boulardii (FLORASTOR) capsule 250 mg  250 mg Oral BID Jani Gravel, MD   250 mg at 05/23/16 0925  . simvastatin (ZOCOR) tablet 20 mg  20 mg Oral q1800 Vianne Bulls, MD   20 mg at 05/22/16 1816  . sodium chloride flush (NS) 0.9 % injection 3 mL  3 mL Intravenous Q12H Vianne Bulls, MD   3 mL at 05/22/16 0916  . vitamin B-12 (CYANOCOBALAMIN) tablet 1,000 mcg  1,000 mcg Oral Daily Vianne Bulls, MD   1,000 mcg at 05/23/16 9528      Discharge Medications: Please see discharge summary for a list of discharge medications.  Relevant Imaging Results:  Relevant Lab Results:   Additional Information SS#: [id number]  Geralynn Ochs, LCSW

## 2016-05-23 NOTE — Evaluation (Signed)
Clinical/Bedside Swallow Evaluation Patient Details  Name: Chad [name] MRN: [id number] Date of Birth: 25-Mar-1929  Today's Date: 05/23/2016 Time: SLP Start Time (ACUTE ONLY): 1149 SLP Stop Time (ACUTE ONLY): 1209 SLP Time Calculation (min) (ACUTE ONLY): 20 min  Past Medical History:  Past Medical History:  Diagnosis Date  . Acid reflux disease   . Arthritis    "all over"  . Arthritis   . Barrett's esophagus   . Basal cell carcinoma of nose   . CAROTID ARTERY STENOSIS, BILATERAL 08/27/2008  . Chronic bronchitis ([place])    "most q yr; he's had it several times" (12/20/2013)  . COPD (chronic obstructive pulmonary disease) ([place])    "severe" (12/20/2013)  . COPD (chronic obstructive pulmonary disease) ([place])   . Coronary artery stenosis   . Diabetes mellitus without complication ([place])   . Diaphragmatic hernia without mention of obstruction or gangrene   . Diverticulitis   . Diverticulosis of colon   . Dyspnea   . Esophageal stenosis   . Fatty liver disease, nonalcoholic   . GERD (gastroesophageal reflux disease)   . Glaucoma   . Glaucoma   . High cholesterol   . History of blood transfusion 2007   "related to OR"  . History of hiatal hernia    "repaired when esophagus removed"  . History of stomach ulcers   . HTN (hypertension)   . Hyperlipidemia   . Hypertension   . Kidney stones    "passed them all" (12/20/2013)  . Memory loss    DEMENT[place]  . Personal history of other malignant neoplasm of skin   . Pneumonia 1990's X 4  . RENAL CALCULUS, HX OF 08/28/2008  . Renal disorder   . Sinus headache    "seasonal"  . Stroke ([place])   . TIA (transient ischemic attack) 1998 X "a few"  . Type II diabetes mellitus ([place])    Past Surgical History:  Past Surgical History:  Procedure Laterality Date  . BASAL CELL CARCINOMA EXCISION    . CAROTID ARTERY ANGIOPLASTY    . CAROTID ENDARTERECTOMY Bilateral 02/1996-03/1996  . CATARACT EXTRACTION W/ INTRAOCULAR LENS IMPLANT Bilateral 04/2007  - 09/2007   "left-right"  . ESOPHAGECTOMY  2007   with stomach pull through  . ESOPHAGOGASTRODUODENOSCOPY (EGD) WITH ESOPHAGEAL DILATION  10/2011  . EYE SURGERY Right 04/2006; 08/2007   "had gas bubbles put in"  . EYE SURGERY Right 01/2007   "air bubble"  . EYE SURGERY Left 12/12/2013   "cleaned his implant w/laser"  . EYE SURGERY    . GLAUCOMA SURGERY Right 11/2008   "implanted drain tube"  . HERNIA REPAIR    . KNEE ARTHROSCOPY Bilateral 04/1989; 08/1996; 06/1999   "right; left; left"  . KNEE SURGERY    . MOHS SURGERY  ?2002   "tip of nose; basal cell"  . NASAL POLYP EXCISION  2005   "benign"  . PARS PLANA VITRECTOMY Bilateral 01/2007-05/2007  . SHOULDER ARTHROSCOPY Right 04/1998  . SHOULDER SURGERY    . VENTRAL HERNIA REPAIR  02/2006   HPI:  Patient is an 83 yo male admitted 05/20/16 after a fall. MRI showed L1 compression fx. PMH: DM, COPD, esophagectomy (2007), Barrett esophagus, pna, HTN, CHF, CVA with Lt weakness, dementia, depression. CXR patchy parenchymal changes within the right lung particularly within the lower lobe. The changes in the right lower lobe a likely related to acute inflammatory change. Recent CT revealed Old RIGHT corona radiata/basal ganglia infarct and moderate to severe chronic small vessel  ischemic disease. BSE 05/2015 recommended regular/thin, pt impulsive.   Assessment / Plan / Recommendation Clinical Impression  No overt s/s aspiration at bedside across textures. Pt's wife reports coughing post prandial since CVA last year which has not improved. She stated he "got choked" the other night with chicken. Wife complains pt is impulsive when eating since CVA. SLP suspects primary esophageal dysphagia with pt's hx of esophagectomy and coughing at end of meal, however given multiple pna's (per wife) and ongoing difficulties recommend an MBS (has not had objective assessment). Pt may be having surgery for the compression fx. SLP will follow and schedule MBS when pt able.    SLP Visit Diagnosis: Dysphagia, pharyngoesophageal phase (R13.14)    Aspiration Risk  Moderate aspiration risk    Diet Recommendation Dysphagia 3 (Mech soft);Thin liquid   Liquid Administration via: Cup;No straw Medication Administration: Whole meds with liquid Supervision: Patient able to self feed;Full supervision/cueing for compensatory strategies Compensations: Slow rate;Small sips/bites    Other  Recommendations Oral Care Recommendations: Oral care BID   Follow up Recommendations  (TBD)      Frequency and Duration min 2x/week  2 weeks       Prognosis Prognosis for Safe Diet Advancement:  (fair-good)      Swallow Study   General HPI: Patient is an 83 yo male admitted 05/20/16 after a fall. MRI showed L1 compression fx. PMH: DM, COPD, esophagectomy (2007), Barrett esophagus, pna, HTN, CHF, CVA with Lt weakness, dementia, depression. CXR patchy parenchymal changes within the right lung particularly within the lower lobe. The changes in the right lower lobe a likely related to acute inflammatory change. Recent CT revealed Old RIGHT corona radiata/basal ganglia infarct and moderate to severe chronic small vessel ischemic disease. BSE 05/2015 recommended regular/thin, pt impulsive. Type of Study: Bedside Swallow Evaluation Previous Swallow Assessment:  (see HPI) Diet Prior to this Study: Dysphagia 3 (soft);Thin liquids Temperature Spikes Noted: No Respiratory Status: Nasal cannula History of Recent Intubation: No Behavior/Cognition: Alert;Cooperative;Pleasant mood;Requires cueing Oral Cavity Assessment: Within Functional Limits Oral Care Completed by SLP: No Oral Cavity - Dentition: Missing dentition;Poor condition (has upper partial, doesn't wear when eating) Vision: Functional for self-feeding Self-Feeding Abilities: Able to feed self;Needs assist;Needs set up Patient Positioning: Upright in bed Baseline Vocal Quality: Normal Volitional Cough: Strong     Oral/Motor/Sensory Function Overall Oral Motor/Sensory Function: Mild impairment Facial ROM: Reduced left Facial Symmetry: Abnormal symmetry left Facial Strength: Reduced left   Ice Chips Ice chips: Not tested   Thin Liquid Thin Liquid: Within functional limits Presentation: Cup    Nectar Thick Nectar Thick Liquid: Not tested   Honey Thick Honey Thick Liquid: Not tested   Puree Puree: Within functional limits   Solid   GO   Solid: Within functional limits        Houston Siren 05/23/2016,12:25 PM   Orbie Pyo Colvin Caroli.[employer] [phone]

## 2016-05-23 NOTE — Progress Notes (Signed)
Patient ID: Chad [name], male   DOB: 02-17-1929, 83 y.o.   MRN: [id number]  PROGRESS NOTE    KIDUS DELMAN  JKD:[id number] DOB: [birth year] DOA: 05/20/2016 PCP: Hoyt Koch, MD   Brief Narrative:  83 y.o.malewith medical history significant for type 2 diabetes mellitus, COPD, hypertension, Barrett esophagus, chronic diastolic CHF, history of CVA with mild left-sided weakness, and dementia who presented to the ED with generalized weakness and a fall at home. Patient was found to have L1 compression fracture; Radiology consultation has been requested. He was also found to have worsening hyponatremia; Lasix was held. He was started on antibiotics after CT chest showed probable patchy infiltrates on 05/22/16. Speech evaluation is pending. He is very deconditioned.  Assessment & Plan:   Principal Problem:   Hyponatremia Active Problems:   Essential hypertension   COPD GOLD II    Barrett's esophagus   Depression   Controlled diabetes mellitus type 2 with complications (HCC)   History of stroke   General weakness   Fall at home, initial encounter   Closed compression fracture of L1 lumbar vertebra (HCC)   Chronic diastolic CHF (congestive heart failure) (HCC)   Pressure injury of skin   1. L1 compression fracture - Suspected to be acute and secondary to the fall just PTA  - No new LE weakness or numbness - IR consulted regarding possible need for vertebroplasty/kypoplasty for pain control for L1 compression fracture; follow recommendations - PT/OT eval; SNF placement - fall precautions  2. Probable community acquired pneumonia: also concerned about aspiration;evolving on admission - continue antibiotics; follow culture; Speech eval; Aspiration precautions.  3. Leukocytosis: improved; continue Abx.; Repeat CBC in AM  4. Hyponatremia  ? Secondary to Lasix Holding lasix; improving; repeat BMP in AM   5. Generalized weakness with fall  - Suspected secondary to #1;  no new focal neurologic deficits identified   6. History of CVA /Baseline dementia - Pt has residual cognitive deficits and mild left-sided weakness  - No new focal findings  - Continue Plavix, statin   Type II DM  - A1c 7.3% in August 2017  - Managed with metformin only at home, held in hospital  - Check CBG with meals and qHS  - Contlow-intensity sliding-scale insulin   Hypertension  - BP is at goal  - Continue clonidine and Norvasc  - Hold losartan initially given apparent dehydration and bump in SCr   GERD - Barrett's esophagus noted on remote EGD  - Managed at home with BID PPI, will continue   Chronic diastolic CHF  - Appears hypovolemic on admission and is receiving IV hydration  - Follow I/O's and daily wts  - Hold Lasix for now due to hyponatremia Wife says does not know about this diagnosis. No signs of volume overload.  COPD  - No respiratory distress or wheezing; no supplemental O2 required  - Continue inhalers, prn nebs   Dementia Cont Aricept     DVT prophylaxis: SCDs; Hold Lovenox for need of vertebroplasty Code Status: DNR Family Communication: Discussed with wife present at bedside Disposition Plan: probably SNF in 1-3 days  Consultants:  Intervention Radiology  Procedures: None Antimicrobials:  Rocephin & Zithromax: 4/29>>  Subjective: Patient seen and examined at bedside. He is awake but doesn't answer much questions. No overnight fever or vomiting reported.  Objective: Vitals:   05/22/16 1949 05/22/16 2135 05/23/16 0451 05/23/16 0452  BP:  129/63 (!) 132/59   Pulse:  64 62   Resp:  18 18   Temp:  98.2 F (36.8 C) 98.3 F (36.8 C)   TempSrc:  Oral Oral   SpO2: 100% 99% 93%   Weight:    70.1 kg (154 lb 9.6 oz)  Height:        Intake/Output Summary (Last 24 hours) at 05/23/16 1021 Last data filed at 05/23/16 5400  Gross per 24 hour  Intake          1963.75 ml  Output              875 ml  Net          1088.75 ml    Filed Weights   05/21/16 0609 05/22/16 0510 05/23/16 0452  Weight: 70.3 kg (154 lb 15.7 oz) 68.6 kg (151 lb 3.2 oz) 70.1 kg (154 lb 9.6 oz)    Examination:  General exam: Appears calm and comfortable  Respiratory system: Bilateral decreased breath sounds at bases with some scattered crackles. Cardiovascular system: S1 & S2 heard, RRR.  Gastrointestinal system: Abdomen is nondistended, soft and nontender. No organomegaly or masses felt. Normal bowel sounds heard. Central nervous system: Awake but confused; moving extremities.  Extremities: no cyanosis , clubbing or edema   Data Reviewed: I have personally reviewed following labs and imaging studies  CBC:  Recent Labs Lab 05/20/16 1846 05/21/16 0538 05/22/16 0915 05/23/16 0408  WBC 10.8* 7.5 13.9* 7.5  NEUTROABS 8.2*  --   --   --   HGB 12.3* 11.9* 12.4* 10.9*  HCT 37.3* 35.9* 37.9* 33.7*  MCV 84.6 84.9 85.6 85.8  PLT 210 171 176 867   Basic Metabolic Panel:  Recent Labs Lab 05/21/16 0056 05/21/16 0538 05/21/16 1400 05/22/16 0915 05/23/16 0408  NA 127* 130* 128* 128* 130*  K 4.1 4.4 4.6 4.2 4.2  CL 92* 95* 93* 94* 96*  CO2 25 25 24 23 27   GLUCOSE 126* 121* 124* 183* 113*  BUN 21* 18 15 16 15   CREATININE 1.30* 1.17 1.12 1.17 1.08  CALCIUM 9.5 9.5 9.6 9.5 9.1   GFR: Estimated Creatinine Clearance: 50.5 mL/min (by C-G formula based on SCr of 1.08 mg/dL). Liver Function Tests:  Recent Labs Lab 05/20/16 1846 05/22/16 0915 05/23/16 0408  AST 24 15 14*  ALT 22 16* 15*  ALKPHOS 76 63 50  BILITOT 0.5 0.9 0.7  PROT 6.5 6.2* 5.4*  ALBUMIN 3.8 3.3* 2.8*   No results for input(s): LIPASE, AMYLASE in the last 168 hours. No results for input(s): AMMONIA in the last 168 hours. Coagulation Profile:  Recent Labs Lab 05/23/16 0913  INR 1.06   Cardiac Enzymes: No results for input(s): CKTOTAL, CKMB, CKMBINDEX, TROPONINI in the last 168 hours. BNP (last 3 results) No results for input(s): PROBNP in the last  8760 hours. HbA1C:  Recent Labs  05/21/16 0056  HGBA1C 6.3*   CBG:  Recent Labs Lab 05/22/16 0746 05/22/16 1216 05/22/16 1706 05/22/16 2142 05/23/16 0749  GLUCAP 165* 108* 141* 128* 118*   Lipid Profile: No results for input(s): CHOL, HDL, LDLCALC, TRIG, CHOLHDL, LDLDIRECT in the last 72 hours. Thyroid Function Tests: No results for input(s): TSH, T4TOTAL, FREET4, T3FREE, THYROIDAB in the last 72 hours. Anemia Panel: No results for input(s): VITAMINB12, FOLATE, FERRITIN, TIBC, IRON, RETICCTPCT in the last 72 hours. Sepsis Labs: No results for input(s): PROCALCITON, LATICACIDVEN in the last 168 hours.  No results found for this or any previous visit (from the past 240 hour(s)).       Radiology Studies:  Dg Chest 2 View  Result Date: 05/22/2016 CLINICAL DATA:  Previous esophagectomy with gastric pull-through procedure. Chest pain EXAM: CHEST  2 VIEW COMPARISON:  May 20, 2016 FINDINGS: The mean L1 vertebral body fracture appear stable compared to recent prior study. There is soft tissue prominence posterior to the left heart, likely of postoperative etiology. There is no new opacity. No frank edema or consolidation. Heart size and pulmonary vascular normal. No adenopathy. There is aortic atherosclerosis. IMPRESSION: Opacity posterior to the left heart is likely due to previous gastric pull-through procedure. No frank edema or consolidation. Aortic atherosclerosis. Stable appearing anterior wedge fracture of the L1 vertebral body. Electronically Signed   By: Lowella Grip III M.D.   On: 05/22/2016 13:07   Ct Chest W Contrast  Result Date: 05/22/2016 CLINICAL DATA:  SIADH EXAM: CT CHEST, ABDOMEN, AND PELVIS WITH CONTRAST TECHNIQUE: Multidetector CT imaging of the chest, abdomen and pelvis was performed following the standard protocol during bolus administration of intravenous contrast. CONTRAST:  10mL ISOVUE-300 IOPAMIDOL (ISOVUE-300) INJECTION 61% COMPARISON:  05/21/2016.  FINDINGS: CT CHEST FINDINGS Cardiovascular: Thoracic aorta demonstrates atherosclerotic calcifications without aneurysmal dilatation or dissection. The pulmonary artery as visualized is within normal limits. Coronary calcifications are seen. No significant cardiac enlargement is noted. Mediastinum/Nodes: No significant hilar or mediastinal adenopathy is noted. The thoracic inlet is within normal limits. Changes consistent with gastric pull-through. The residual esophagus is somewhat patulous. Lungs/Pleura: Biapical scarring is noted. Patchy ground-glass changes are noted in the right upper lobe posteriorly best seen on image number 56. Patchy infiltrative changes are noted within the right lower lobe as well. Some scarring is noted in the anterior aspect of the left upper lobe. Mild left basilar atelectasis is seen. Musculoskeletal: Degenerative changes of the thoracic spine are noted. No acute rib fracture is seen. CT ABDOMEN PELVIS FINDINGS Hepatobiliary: No focal liver abnormality is seen. No gallstones, gallbladder wall thickening, or biliary dilatation. Pancreas: The pancreas is predominately fatty infiltrated with the exception of the uncinate process. It is also somewhat superiorly displaced related to the gastric pull-through. Spleen: Normal in size without focal abnormality. Adrenals/Urinary Tract: The adrenal glands are within normal limits. Nonobstructing renal calculi are noted on the left. The largest of these is noted in the mid to upper pole measuring approximately 6 mm in greatest dimension. No obstructive changes are seen. The bladder is well distended. Few small right renal calculi are noted. Scattered renal cysts are seen. Stomach/Bowel: Stomach is within normal limits. Appendix appears normal. Scattered diverticular changes noted without diverticulitis. No evidence of bowel wall thickening, distention, or inflammatory changes. Vascular/Lymphatic: Aortic atherosclerosis. No enlarged abdominal or  pelvic lymph nodes. Reproductive: Prostate is unremarkable. Other: Changes consistent with prior hernia repair are noted. There remains some laxity of the abdominal wall in the midline. Musculoskeletal: Degenerative changes of lumbar spine are seen. Additionally at L1 compression deformity is noted similar to that seen on recent MRI. IMPRESSION: Patchy parenchymal changes within the right lung particularly within the lower lobe. The changes in the right lower lobe a likely related to acute inflammatory change. Short-term followup following appropriate therapy is recommended. This could be accomplished with a noncontrast CT. Changes consistent with prior esophagectomy and gastric pull through. L1 compression deformity similar to that seen on recent MRI. No focal neoplasm is noted. Diverticulosis without diverticulitis. Bilateral nonobstructing renal stones worse on the left than the right. Electronically Signed   By: Inez Catalina M.D.   On: 05/22/2016 18:21   Mr Lumbar  Spine Wo Contrast  Result Date: 05/21/2016 CLINICAL DATA:  Patient with pain. Dementia. Recent fall onto back, striking the mid thorax. EXAM: MRI LUMBAR SPINE WITHOUT CONTRAST TECHNIQUE: Multiplanar, multisequence MR imaging of the lumbar spine was performed. No intravenous contrast was administered. COMPARISON:  Thoracic radiographs 05/20/2016. FINDINGS: The patient was unable to remain motionless for the exam. Small or subtle lesions could be overlooked. Segmentation:  Standard. Alignment:  3 mm of anterolisthesis L4-5, facet mediated. Vertebrae: Acute compression fracture of L1, bone marrow edema along a transverse plane through the superior aspect of the vertebral body. No involvement of the pedicles or posterior elements. Superior endplate depression, without significant anterior loss of vertebral body height. No retropulsion. LEFT greater than RIGHT paravertebral hematoma extends cephalad along the lateral margin of T12. No epidural hematoma.  No other vertebral body compression fracture is observed. Conus medullaris: Extends to the L1 level and appears normal. No evidence for conus contusion. Paraspinal and other soft tissues: Unremarkable. Disc levels: L1-L2:  Unremarkable. L2-L3:  Unremarkable. L3-L4:  Unremarkable. L4-L5: 3 mm anterolisthesis is facet mediated. There is a superimposed broad-based disc protrusion contributing to mild stenosis. Either L5 nerve root could be affected. No significant foraminal narrowing. L5-S1: Disc space narrowing. Central protrusion. Posterior element hypertrophy. No impingement. IMPRESSION: Findings consistent with a benign, osteopenic, posttraumatic L1 fracture, primarily superior endplate depression. The appearance is stable compared with yesterday's plain films. The patient may be a candidate for vertebral augmentation. Please consult Interventional Radiology if clinically indicated. Degenerative anterolisthesis of 3 mm at L4-5, compounded by a broad-based disc protrusion contributing to mild stenosis. Either L5 nerve root could be affected. Electronically Signed   By: Staci Righter M.D.   On: 05/21/2016 12:37   Ct Abdomen Pelvis W Contrast  Result Date: 05/22/2016 CLINICAL DATA:  SIADH EXAM: CT CHEST, ABDOMEN, AND PELVIS WITH CONTRAST TECHNIQUE: Multidetector CT imaging of the chest, abdomen and pelvis was performed following the standard protocol during bolus administration of intravenous contrast. CONTRAST:  52mL ISOVUE-300 IOPAMIDOL (ISOVUE-300) INJECTION 61% COMPARISON:  05/21/2016. FINDINGS: CT CHEST FINDINGS Cardiovascular: Thoracic aorta demonstrates atherosclerotic calcifications without aneurysmal dilatation or dissection. The pulmonary artery as visualized is within normal limits. Coronary calcifications are seen. No significant cardiac enlargement is noted. Mediastinum/Nodes: No significant hilar or mediastinal adenopathy is noted. The thoracic inlet is within normal limits. Changes consistent with  gastric pull-through. The residual esophagus is somewhat patulous. Lungs/Pleura: Biapical scarring is noted. Patchy ground-glass changes are noted in the right upper lobe posteriorly best seen on image number 56. Patchy infiltrative changes are noted within the right lower lobe as well. Some scarring is noted in the anterior aspect of the left upper lobe. Mild left basilar atelectasis is seen. Musculoskeletal: Degenerative changes of the thoracic spine are noted. No acute rib fracture is seen. CT ABDOMEN PELVIS FINDINGS Hepatobiliary: No focal liver abnormality is seen. No gallstones, gallbladder wall thickening, or biliary dilatation. Pancreas: The pancreas is predominately fatty infiltrated with the exception of the uncinate process. It is also somewhat superiorly displaced related to the gastric pull-through. Spleen: Normal in size without focal abnormality. Adrenals/Urinary Tract: The adrenal glands are within normal limits. Nonobstructing renal calculi are noted on the left. The largest of these is noted in the mid to upper pole measuring approximately 6 mm in greatest dimension. No obstructive changes are seen. The bladder is well distended. Few small right renal calculi are noted. Scattered renal cysts are seen. Stomach/Bowel: Stomach is within normal limits. Appendix appears normal.  Scattered diverticular changes noted without diverticulitis. No evidence of bowel wall thickening, distention, or inflammatory changes. Vascular/Lymphatic: Aortic atherosclerosis. No enlarged abdominal or pelvic lymph nodes. Reproductive: Prostate is unremarkable. Other: Changes consistent with prior hernia repair are noted. There remains some laxity of the abdominal wall in the midline. Musculoskeletal: Degenerative changes of lumbar spine are seen. Additionally at L1 compression deformity is noted similar to that seen on recent MRI. IMPRESSION: Patchy parenchymal changes within the right lung particularly within the lower lobe.  The changes in the right lower lobe a likely related to acute inflammatory change. Short-term followup following appropriate therapy is recommended. This could be accomplished with a noncontrast CT. Changes consistent with prior esophagectomy and gastric pull through. L1 compression deformity similar to that seen on recent MRI. No focal neoplasm is noted. Diverticulosis without diverticulitis. Bilateral nonobstructing renal stones worse on the left than the right. Electronically Signed   By: Inez Catalina M.D.   On: 05/22/2016 18:21        Scheduled Meds: . amLODipine  10 mg Oral Daily  . cholecalciferol  1,000 Units Oral Daily  . cloNIDine  0.1 mg Oral Daily  . clopidogrel  75 mg Oral Daily  . donepezil  10 mg Oral QHS  . insulin aspart  0-5 Units Subcutaneous QHS  . insulin aspart  0-9 Units Subcutaneous TID WC  . latanoprost  1 drop Left Eye BID  . LORazepam  0.5 mg Oral QHS  . mometasone-formoterol  2 puff Inhalation BID  . pantoprazole  40 mg Oral BID  . saccharomyces boulardii  250 mg Oral BID  . simvastatin  20 mg Oral q1800  . sodium chloride flush  3 mL Intravenous Q12H  . vitamin B-12  1,000 mcg Oral Daily   Continuous Infusions: . azithromycin Stopped (05/22/16 2234)  . cefTRIAXone (ROCEPHIN)  IV Stopped (05/22/16 2134)     LOS: 3 days        Aline August, MD [facility] Pager [phone] If 7PM-7AM, please contact night-coverage [email or web address] Password [email or web address] 05/23/2016, 10:21 AM

## 2016-05-24 ENCOUNTER — Inpatient Hospital Stay (HOSPITAL_COMMUNITY): Payer: PPO

## 2016-05-24 LAB — BASIC METABOLIC PANEL
ANION GAP: 9 (ref 5–15)
BUN: 16 mg/dL (ref 6–20)
CALCIUM: 9.2 mg/dL (ref 8.9–10.3)
CO2: 26 mmol/L (ref 22–32)
Chloride: 96 mmol/L — ABNORMAL LOW (ref 101–111)
Creatinine, Ser: 1.11 mg/dL (ref 0.61–1.24)
GFR calc Af Amer: >60 mL/min (ref >60)
GFR, EST NON AFRICAN AMERICAN: 59 mL/min — AB (ref >60)
GLUCOSE: 125 mg/dL — AB (ref 65–99)
Potassium: 4.2 mmol/L (ref 3.5–5.1)
Sodium: 131 mmol/L — ABNORMAL LOW (ref 135–145)

## 2016-05-24 LAB — CBC WITH DIFFERENTIAL/PLATELET
Basophils Absolute: 0 10*3/uL (ref 0.0–0.1)
Basophils Relative: 0 %
EOS PCT: 8 %
Eosinophils Absolute: 0.6 10*3/uL (ref 0.0–0.7)
HCT: 33.7 % — ABNORMAL LOW (ref 39.0–52.0)
Hemoglobin: 10.9 g/dL — ABNORMAL LOW (ref 13.0–17.0)
LYMPHS ABS: 1.3 10*3/uL (ref 0.7–4.0)
LYMPHS PCT: 18 %
MCH: 27.7 pg (ref 26.0–34.0)
MCHC: 32.3 g/dL (ref 30.0–36.0)
MCV: 85.8 fL (ref 78.0–100.0)
MONOS PCT: 15 %
Monocytes Absolute: 1.1 10*3/uL — ABNORMAL HIGH (ref 0.1–1.0)
Neutro Abs: 4.2 10*3/uL (ref 1.7–7.7)
Neutrophils Relative %: 59 %
PLATELETS: 171 10*3/uL (ref 150–400)
RBC: 3.93 MIL/uL — ABNORMAL LOW (ref 4.22–5.81)
RDW: 15.6 % — AB (ref 11.5–15.5)
WBC: 7.2 10*3/uL (ref 4.0–10.5)

## 2016-05-24 LAB — GLUCOSE, CAPILLARY
GLUCOSE-CAPILLARY: 132 mg/dL — AB (ref 65–99)
Glucose-Capillary: 132 mg/dL — ABNORMAL HIGH (ref 65–99)
Glucose-Capillary: 151 mg/dL — ABNORMAL HIGH (ref 65–99)
Glucose-Capillary: 129 mg/dL — ABNORMAL HIGH (ref 65–99)

## 2016-05-24 MED ORDER — ENOXAPARIN SODIUM 40 MG/0.4ML ~~LOC~~ SOLN
40.0000 mg | Freq: Every day | SUBCUTANEOUS | Status: DC
  Administered 2016-05-24 – 2016-05-27 (×4): 40 mg via SUBCUTANEOUS
  Filled 2016-05-24 (×4): qty 0.4

## 2016-05-24 NOTE — Progress Notes (Signed)
  Speech Language Pathology  Patient Details Name: Chad [name] MRN: [id number] DOB: 08-18-1929 Today's Date: 05/24/2016 Time:  -     MBS today at [address] 05/24/2016, 10:01 AM

## 2016-05-24 NOTE — Progress Notes (Signed)
IR following for possible KP/VP.  Patient takes Plavix and has continued Plavix since admission.  Discussed case with Dr. Starla Link.  Plan for procedure early next week once Plavix has been held.   Per Dr. Starla Link, patient may discharge either today or tomorrow. Have sent a message to schedulers for arrangement of outpatient procedure.  Patient will hear from schedulers with date and time of appointment.   Brynda Greathouse, MMS RDN PA-C

## 2016-05-24 NOTE — Progress Notes (Addendum)
Patient ID: Chad [name], male   DOB: 1929-12-03, 83 y.o.   MRN: [id number]  PROGRESS NOTE    EMMANUAL GAUTHREAUX  JEH:[id number] DOB: November 19, 1929 DOA: 05/20/2016 PCP: Hoyt Koch, MD   Brief Narrative:  83 y.o.malewith medical history significant for type 2 diabetes mellitus, COPD, hypertension, Barrett esophagus, chronic diastolic CHF, history of CVA with mild left-sided weakness, and dementia who presented to the ED with generalized weakness and a fall at home. Patient was found to have L1 compression fracture; Radiology consultation has been requested. He was also found to have worsening hyponatremia; Lasix was held. He was started on antibiotics after CT chest showed probable patchy infiltrates on 05/22/16. IR recommends that he can have vertebroplasty/kyphoplasty earliest on 05/30/16 as he was still on plavix till 05/23/16. Plavix held. Patient will need SNF placement and probably outpatient procedure.  Assessment & Plan:   Principal Problem:   Hyponatremia Active Problems:   Essential hypertension   COPD GOLD II    Barrett's esophagus   Depression   Controlled diabetes mellitus type 2 with complications (HCC)   History of stroke   General weakness   Fall at home, initial encounter   Closed compression fracture of L1 lumbar vertebra (HCC)   Chronic diastolic CHF (congestive heart failure) (HCC)   Pressure injury of skin   1. L1 compression fracture - Suspected to be acute and secondary to the fall just PTA  - No new LE weakness or numbness - As per IR, the earliest date for vertebroplasty/kyphoplasty would be 5/7 as patient was on Plavix till yesterday; Plavix held since yesterday. Will probably have to arrange for outpatient procedure. - PT/OT eval; SNF placement - fall precautions  2. Probable community acquired pneumonia: also concerned about aspiration;evolving on admission - continue antibiotics; follow culture; Speech eval; Aspiration precautions.  - Repeat CXR  in AM  3. Leukocytosis: improved  4. Hyponatremia: improving ? Secondary to Lasix Holding lasix; repeat BMP in AM   5. Generalized weakness with fall  - Suspected secondary to #1; no new focal neurologic deficits identified   6. History of CVA /Baseline dementia - Pt has residual cognitive deficits and mild left-sided weakness  - No new focal findings  - Hold Plavix for probable vertebroplasty   Type II DM  - A1c 7.3% in August 2017  - Managed with metformin only at home, held in hospital  - Check CBG with meals and qHS  - Contlow-intensity sliding-scale insulin   Hypertension  - BP is at goal  - Continue clonidine and Norvasc  - Hold losartan initially given apparent dehydration and bump in SCr   GERD - Barrett's esophagus noted on remote EGD  - Managed at home with BID PPI, will continue   Chronic diastolic CHF  - Appears hypovolemic on admission and is receiving IV hydration  - Follow I/O's and daily wts  - Hold Lasix for now due to hyponatremia Wife says does not know about this diagnosis. No signs of volume overload.  COPD  - No respiratory distress or wheezing; no supplemental O2 required  - Continue inhalers, prn nebs   Dementia Cont Aricept  AKI has been ruled out  DVT prophylaxis: SCDs; Start lovenox Code Status: DNR Family Communication: Discussed with wife present at bedside Disposition Plan: probably SNF in 1-2 days  Consultants:  Intervention Radiology  Procedures: None Antimicrobials:  Rocephin & Zithromax: 4/29>>  Subjective: Patient seen and examined at bedside. He is awake but doesn't answer much  questions. No overnight fever or vomiting reported.   Objective: Vitals:   05/23/16 2134 05/24/16 0318 05/24/16 0540 05/24/16 0721  BP: 134/72  123/68   Pulse: 70  65 64  Resp: 18  16 16   Temp: 99.2 F (37.3 C)  98.4 F (36.9 C)   TempSrc:      SpO2: 94%  96% 91%  Weight:  70.2 kg (154 lb 11.2 oz)    Height:         Intake/Output Summary (Last 24 hours) at 05/24/16 0952 Last data filed at 05/24/16 0542  Gross per 24 hour  Intake              225 ml  Output             2575 ml  Net            -2350 ml   Filed Weights   05/22/16 0510 05/23/16 0452 05/24/16 0318  Weight: 68.6 kg (151 lb 3.2 oz) 70.1 kg (154 lb 9.6 oz) 70.2 kg (154 lb 11.2 oz)    Examination:  General exam: Appears calm and comfortable  Respiratory system: Bilateral decreased breath sounds at bases with some scattered crackles. Cardiovascular system: S1 & S2 heard, RRR.  Gastrointestinal system: Abdomen is nondistended, soft and nontender. No organomegaly or masses felt. Normal bowel sounds heard. Central nervous system: Awake but confused; moving extremities.  Extremities: no cyanosis , clubbing or edema   Data Reviewed: I have personally reviewed following labs and imaging studies  CBC:  Recent Labs Lab 05/20/16 1846 05/21/16 0538 05/22/16 0915 05/23/16 0408 05/24/16 0331  WBC 10.8* 7.5 13.9* 7.5 7.2  NEUTROABS 8.2*  --   --   --  4.2  HGB 12.3* 11.9* 12.4* 10.9* 10.9*  HCT 37.3* 35.9* 37.9* 33.7* 33.7*  MCV 84.6 84.9 85.6 85.8 85.8  PLT 210 171 176 161 283   Basic Metabolic Panel:  Recent Labs Lab 05/21/16 0538 05/21/16 1400 05/22/16 0915 05/23/16 0408 05/24/16 0331  NA 130* 128* 128* 130* 131*  K 4.4 4.6 4.2 4.2 4.2  CL 95* 93* 94* 96* 96*  CO2 25 24 23 27 26   GLUCOSE 121* 124* 183* 113* 125*  BUN 18 15 16 15 16   CREATININE 1.17 1.12 1.17 1.08 1.11  CALCIUM 9.5 9.6 9.5 9.1 9.2   GFR: Estimated Creatinine Clearance: 49.2 mL/min (by C-G formula based on SCr of 1.11 mg/dL). Liver Function Tests:  Recent Labs Lab 05/20/16 1846 05/22/16 0915 05/23/16 0408  AST 24 15 14*  ALT 22 16* 15*  ALKPHOS 76 63 50  BILITOT 0.5 0.9 0.7  PROT 6.5 6.2* 5.4*  ALBUMIN 3.8 3.3* 2.8*   No results for input(s): LIPASE, AMYLASE in the last 168 hours. No results for input(s): AMMONIA in the last 168  hours. Coagulation Profile:  Recent Labs Lab 05/23/16 0913  INR 1.06   Cardiac Enzymes: No results for input(s): CKTOTAL, CKMB, CKMBINDEX, TROPONINI in the last 168 hours. BNP (last 3 results) No results for input(s): PROBNP in the last 8760 hours. HbA1C: No results for input(s): HGBA1C in the last 72 hours. CBG:  Recent Labs Lab 05/23/16 1221 05/23/16 1649 05/23/16 1701 05/23/16 2106 05/24/16 0737  GLUCAP 116* 119* 119* 142* 132*   Lipid Profile: No results for input(s): CHOL, HDL, LDLCALC, TRIG, CHOLHDL, LDLDIRECT in the last 72 hours. Thyroid Function Tests: No results for input(s): TSH, T4TOTAL, FREET4, T3FREE, THYROIDAB in the last 72 hours. Anemia Panel: No results for  input(s): VITAMINB12, FOLATE, FERRITIN, TIBC, IRON, RETICCTPCT in the last 72 hours. Sepsis Labs: No results for input(s): PROCALCITON, LATICACIDVEN in the last 168 hours.  No results found for this or any previous visit (from the past 240 hour(s)).       Radiology Studies: Dg Chest 2 View  Result Date: 05/22/2016 CLINICAL DATA:  Previous esophagectomy with gastric pull-through procedure. Chest pain EXAM: CHEST  2 VIEW COMPARISON:  May 20, 2016 FINDINGS: The mean L1 vertebral body fracture appear stable compared to recent prior study. There is soft tissue prominence posterior to the left heart, likely of postoperative etiology. There is no new opacity. No frank edema or consolidation. Heart size and pulmonary vascular normal. No adenopathy. There is aortic atherosclerosis. IMPRESSION: Opacity posterior to the left heart is likely due to previous gastric pull-through procedure. No frank edema or consolidation. Aortic atherosclerosis. Stable appearing anterior wedge fracture of the L1 vertebral body. Electronically Signed   By: Lowella Grip III M.D.   On: 05/22/2016 13:07   Ct Chest W Contrast  Result Date: 05/22/2016 CLINICAL DATA:  SIADH EXAM: CT CHEST, ABDOMEN, AND PELVIS WITH CONTRAST  TECHNIQUE: Multidetector CT imaging of the chest, abdomen and pelvis was performed following the standard protocol during bolus administration of intravenous contrast. CONTRAST:  28mL ISOVUE-300 IOPAMIDOL (ISOVUE-300) INJECTION 61% COMPARISON:  05/21/2016. FINDINGS: CT CHEST FINDINGS Cardiovascular: Thoracic aorta demonstrates atherosclerotic calcifications without aneurysmal dilatation or dissection. The pulmonary artery as visualized is within normal limits. Coronary calcifications are seen. No significant cardiac enlargement is noted. Mediastinum/Nodes: No significant hilar or mediastinal adenopathy is noted. The thoracic inlet is within normal limits. Changes consistent with gastric pull-through. The residual esophagus is somewhat patulous. Lungs/Pleura: Biapical scarring is noted. Patchy ground-glass changes are noted in the right upper lobe posteriorly best seen on image number 56. Patchy infiltrative changes are noted within the right lower lobe as well. Some scarring is noted in the anterior aspect of the left upper lobe. Mild left basilar atelectasis is seen. Musculoskeletal: Degenerative changes of the thoracic spine are noted. No acute rib fracture is seen. CT ABDOMEN PELVIS FINDINGS Hepatobiliary: No focal liver abnormality is seen. No gallstones, gallbladder wall thickening, or biliary dilatation. Pancreas: The pancreas is predominately fatty infiltrated with the exception of the uncinate process. It is also somewhat superiorly displaced related to the gastric pull-through. Spleen: Normal in size without focal abnormality. Adrenals/Urinary Tract: The adrenal glands are within normal limits. Nonobstructing renal calculi are noted on the left. The largest of these is noted in the mid to upper pole measuring approximately 6 mm in greatest dimension. No obstructive changes are seen. The bladder is well distended. Few small right renal calculi are noted. Scattered renal cysts are seen. Stomach/Bowel: Stomach  is within normal limits. Appendix appears normal. Scattered diverticular changes noted without diverticulitis. No evidence of bowel wall thickening, distention, or inflammatory changes. Vascular/Lymphatic: Aortic atherosclerosis. No enlarged abdominal or pelvic lymph nodes. Reproductive: Prostate is unremarkable. Other: Changes consistent with prior hernia repair are noted. There remains some laxity of the abdominal wall in the midline. Musculoskeletal: Degenerative changes of lumbar spine are seen. Additionally at L1 compression deformity is noted similar to that seen on recent MRI. IMPRESSION: Patchy parenchymal changes within the right lung particularly within the lower lobe. The changes in the right lower lobe a likely related to acute inflammatory change. Short-term followup following appropriate therapy is recommended. This could be accomplished with a noncontrast CT. Changes consistent with prior esophagectomy and gastric pull  through. L1 compression deformity similar to that seen on recent MRI. No focal neoplasm is noted. Diverticulosis without diverticulitis. Bilateral nonobstructing renal stones worse on the left than the right. Electronically Signed   By: Inez Catalina M.D.   On: 05/22/2016 18:21   Ct Abdomen Pelvis W Contrast  Result Date: 05/22/2016 CLINICAL DATA:  SIADH EXAM: CT CHEST, ABDOMEN, AND PELVIS WITH CONTRAST TECHNIQUE: Multidetector CT imaging of the chest, abdomen and pelvis was performed following the standard protocol during bolus administration of intravenous contrast. CONTRAST:  18mL ISOVUE-300 IOPAMIDOL (ISOVUE-300) INJECTION 61% COMPARISON:  05/21/2016. FINDINGS: CT CHEST FINDINGS Cardiovascular: Thoracic aorta demonstrates atherosclerotic calcifications without aneurysmal dilatation or dissection. The pulmonary artery as visualized is within normal limits. Coronary calcifications are seen. No significant cardiac enlargement is noted. Mediastinum/Nodes: No significant hilar or  mediastinal adenopathy is noted. The thoracic inlet is within normal limits. Changes consistent with gastric pull-through. The residual esophagus is somewhat patulous. Lungs/Pleura: Biapical scarring is noted. Patchy ground-glass changes are noted in the right upper lobe posteriorly best seen on image number 56. Patchy infiltrative changes are noted within the right lower lobe as well. Some scarring is noted in the anterior aspect of the left upper lobe. Mild left basilar atelectasis is seen. Musculoskeletal: Degenerative changes of the thoracic spine are noted. No acute rib fracture is seen. CT ABDOMEN PELVIS FINDINGS Hepatobiliary: No focal liver abnormality is seen. No gallstones, gallbladder wall thickening, or biliary dilatation. Pancreas: The pancreas is predominately fatty infiltrated with the exception of the uncinate process. It is also somewhat superiorly displaced related to the gastric pull-through. Spleen: Normal in size without focal abnormality. Adrenals/Urinary Tract: The adrenal glands are within normal limits. Nonobstructing renal calculi are noted on the left. The largest of these is noted in the mid to upper pole measuring approximately 6 mm in greatest dimension. No obstructive changes are seen. The bladder is well distended. Few small right renal calculi are noted. Scattered renal cysts are seen. Stomach/Bowel: Stomach is within normal limits. Appendix appears normal. Scattered diverticular changes noted without diverticulitis. No evidence of bowel wall thickening, distention, or inflammatory changes. Vascular/Lymphatic: Aortic atherosclerosis. No enlarged abdominal or pelvic lymph nodes. Reproductive: Prostate is unremarkable. Other: Changes consistent with prior hernia repair are noted. There remains some laxity of the abdominal wall in the midline. Musculoskeletal: Degenerative changes of lumbar spine are seen. Additionally at L1 compression deformity is noted similar to that seen on recent  MRI. IMPRESSION: Patchy parenchymal changes within the right lung particularly within the lower lobe. The changes in the right lower lobe a likely related to acute inflammatory change. Short-term followup following appropriate therapy is recommended. This could be accomplished with a noncontrast CT. Changes consistent with prior esophagectomy and gastric pull through. L1 compression deformity similar to that seen on recent MRI. No focal neoplasm is noted. Diverticulosis without diverticulitis. Bilateral nonobstructing renal stones worse on the left than the right. Electronically Signed   By: Inez Catalina M.D.   On: 05/22/2016 18:21        Scheduled Meds: . amLODipine  10 mg Oral Daily  . cholecalciferol  1,000 Units Oral Daily  . cloNIDine  0.1 mg Oral Daily  . donepezil  10 mg Oral QHS  . insulin aspart  0-5 Units Subcutaneous QHS  . insulin aspart  0-9 Units Subcutaneous TID WC  . latanoprost  1 drop Left Eye BID  . LORazepam  0.5 mg Oral QHS  . mometasone-formoterol  2 puff Inhalation BID  .  pantoprazole  40 mg Oral BID  . saccharomyces boulardii  250 mg Oral BID  . simvastatin  20 mg Oral q1800  . sodium chloride flush  3 mL Intravenous Q12H  . vitamin B-12  1,000 mcg Oral Daily   Continuous Infusions: . azithromycin Stopped (05/23/16 2240)  . cefTRIAXone (ROCEPHIN)  IV Stopped (05/23/16 2040)     LOS: 4 days        Aline August, MD [facility] Pager [phone] If 7PM-7AM, please contact night-coverage [email or web address] Password [email or web address] 05/24/2016, 9:52 AM

## 2016-05-24 NOTE — Progress Notes (Addendum)
2pm Received call from [facility] screening committee- patient has been approved for 45 days at [facility] contracted facility- referrals sent out to [facility] contracted facilities awaiting response.  [facility] application for SNF completed with pt wife at bedside and faxed to [place]- likely to hear from Committee this afternoon concerning pt approval   CSW will continue to follow  Jorge Ny, [place] Social Worker [phone]

## 2016-05-24 NOTE — Progress Notes (Signed)
Modified Barium Swallow Progress Note  Patient Details  Name: Chad [name] MRN: [id number] Date of Birth: [birth year]  Today's Date: 05/24/2016  Modified Barium Swallow completed.  Full report located under Chart Review in the Imaging Section.  Brief recommendations include the following:  Clinical Impression  Pt exhibited a mild oral dysphagia marked by prolonged mastication with solid and mild premature spill with thin. Mild motor impairments led to mild vallecular and pyriform sinsus residue which was cleared during the study. Of note, pt coughed frequently throughout the study however no barium was penetrated or aspirated during the assessment. He is at higher aspiration risk given esophagectomy. Recommend he continue Dys 3 diet/ thin liquids, stay up 1 hour after meals, straws allowed, and pills with thin as tolerated. Will follow up.      Swallow Evaluation Recommendations       SLP Diet Recommendations: Dysphagia 3 (Mech soft) solids;Thin liquid   Liquid Administration via: Cup;Straw   Medication Administration: Whole meds with liquid   Supervision: Patient able to self feed;Full supervision/cueing for compensatory strategies   Compensations: Slow rate;Small sips/bites   Postural Changes: Seated upright at 90 degrees;Remain semi-upright after after feeds/meals (Comment)   Oral Care Recommendations: Oral care BID        Houston Siren 05/24/2016,3:44 PM   Orbie Pyo [place].Ed [employer] [phone]

## 2016-05-24 NOTE — Evaluation (Signed)
Occupational Therapy Evaluation Patient Details Name: Chad [name] MRN: [id number] DOB: 09-14-1929 Today's Date: 05/24/2016    History of Present Illness Patient is an 83 yo male admitted 05/20/16 after a fall.  MRI showed L1 compression fx.    PMH:  DM, COPD, HTN, CHF, CVA with Lt weakness, dementia, depression   Clinical Impression   PTA, pt required assistance with dressing and bathing tasks but was able to complete self-feeding and grooming after set-up and ambulate in home to bathroom with RW. Pt currently requires max-total assist for bed mobility in preparation to ADL seated at EOB and total assist with ADL tasks. He would benefit from SNF placement post-acute D/C for continued rehabilitation in order to facilitate improved ADL independence. OT will continue to follow while admitted.    Follow Up Recommendations  SNF;Supervision/Assistance - 24 hour    Equipment Recommendations  Other (comment) (TBD at next venue of care)    Recommendations for Other Services       Precautions / Restrictions Precautions Precautions: Fall Precaution Comments: History of falls at home Restrictions Weight Bearing Restrictions: No      Mobility Bed Mobility Overal bed mobility: Needs Assistance Bed Mobility: Rolling;Sidelying to Sit;Sit to Supine Rolling: Max assist Sidelying to sit: Total assist   Sit to supine: Total assist   General bed mobility comments: Pt able to minimally assist with rolling but total assist for sidelying to sit and return to supine secondary to pain.  Transfers                 General transfer comment: Unable    Balance Overall balance assessment: Needs assistance;History of Falls Sitting-balance support: Bilateral upper extremity supported;Feet supported Sitting balance-Leahy Scale: Poor Sitting balance - Comments: Min assist initially and increasing to max assist required with fatigue.                                   ADL  either performed or assessed with clinical judgement   ADL Overall ADL's : Needs assistance/impaired                                       General ADL Comments: Pt requiring total assist at this time for ADL.     Vision Patient Visual Report: No change from baseline Additional Comments: Pt closing his eyes throughout majority of evaluation. Opened eyes during movement and in response to pain.     Perception     Praxis      Pertinent Vitals/Pain Pain Assessment: Faces Faces Pain Scale: Hurts little more Pain Location: back with position changes Pain Descriptors / Indicators: Grimacing Pain Intervention(s): Monitored during session     Hand Dominance     Extremity/Trunk Assessment Upper Extremity Assessment Upper Extremity Assessment: LUE deficits/detail;Generalized weakness RUE Deficits / Details: Moving R UE to support self at EOB. Able to complete approximately 100 degrees of shoulder flexion to reach up for therapist's hand.  LUE Deficits / Details: Tightness in L UE and decreased strength/active movement due to previous CVA.   Lower Extremity Assessment Lower Extremity Assessment: Defer to PT evaluation   Cervical / Trunk Assessment Cervical / Trunk Assessment: Kyphotic   Communication     Cognition Arousal/Alertness: Awake/alert Behavior During Therapy: Flat affect Overall Cognitive Status: History of cognitive impairments - at baseline  General Comments       Exercises     Shoulder Instructions      Home Living Family/patient expects to be discharged to:: Private residence Living Arrangements: Spouse/significant other Available Help at Discharge: Family;Available 24 hours/day Type of Home: House Home Access: Stairs to enter [employer] of Steps: 1 Entrance Stairs-Rails: Right;Left Home Layout: Multi-level Alternate Level Stairs-Number of Steps: 1   Bathroom Shower/Tub:  Corporate investment banker: Standard Bathroom Accessibility: Yes   Home Equipment: Environmental consultant - 2 wheels;Cane - single point;Toilet riser          Prior Functioning/Environment Level of Independence: Needs assistance  Gait / Transfers Assistance Needed: Pt ambulating with RW and increassing level of assist for short distances ADL's / Homemaking Assistance Needed: Wife helped with dressing due to confusion/difficulty problem solving   Comments: Pt able to feed himself and do grooming with set-up. However, required assist with all other ADL.        OT Problem List: Decreased strength;Decreased range of motion;Decreased activity tolerance;Impaired balance (sitting and/or standing);Impaired vision/perception;Decreased cognition;Decreased safety awareness;Decreased knowledge of use of DME or AE;Decreased knowledge of precautions;Impaired UE functional use;Pain      OT Treatment/Interventions: Self-care/ADL training;Therapeutic exercise;Energy conservation;DME and/or AE instruction;Therapeutic activities;Patient/family education;Balance training;Cognitive remediation/compensation;Visual/perceptual remediation/compensation    OT Goals(Current goals can be found in the care plan section) Acute Rehab OT Goals Patient Stated Goal: Patient unable to state OT Goal Formulation: With patient/family Time For Goal Achievement: 06/07/16 Potential to Achieve Goals: Fair ADL Goals Pt Will Perform Eating: with set-up;sitting Pt Will Perform Grooming: with set-up;sitting Pt Will Transfer to Toilet: with mod assist;stand pivot transfer;bedside commode Pt/caregiver will Perform Home Exercise Program: Increased strength;Increased ROM;Both right and left upper extremity;With written HEP provided (different HEP for each UE due to previous CVA and spasticity) Additional ADL Goal #1: Pt will complete bed mobility with min assist utilizing log roll technique in preparation for ADL seated at EOB.   OT Frequency: Min 2X/week   Barriers to D/C:            Co-evaluation              AM-PAC PT "6 Clicks" Daily Activity     Outcome Measure Help from another person eating meals?: Total Help from another person taking care of personal grooming?: Total Help from another person toileting, which includes using toliet, bedpan, or urinal?: Total Help from another person bathing (including washing, rinsing, drying)?: Total Help from another person to put on and taking off regular upper body clothing?: Total Help from another person to put on and taking off regular lower body clothing?: Total 6 Click Score: 6   End of Session Nurse Communication: Patient requests pain meds  Activity Tolerance: Patient limited by pain Patient left: in bed;with call bell/phone within reach;with family/visitor present  OT Visit Diagnosis: Muscle weakness (generalized) (M62.81);Cognitive communication deficit (R41.841);Pain;Hemiplegia and hemiparesis Hemiplegia - Right/Left: Left Hemiplegia - dominant/non-dominant: Non-Dominant Hemiplegia - caused by: Cerebral infarction Pain - part of body:  (back)                Time: 0350-0938 OT Time Calculation (min): 16 min Charges:  OT General Charges $OT Visit: 1 Procedure OT Evaluation $OT Eval Moderate Complexity: 1 Procedure G-Codes:     Norman Herrlich, MS OTR/L  Pager: [place] A Marlita Keil 05/24/2016, 4:11 PM

## 2016-05-25 ENCOUNTER — Inpatient Hospital Stay (HOSPITAL_COMMUNITY): Payer: PPO

## 2016-05-25 DIAGNOSIS — S32010G Wedge compression fracture of first lumbar vertebra, subsequent encounter for fracture with delayed healing: Secondary | ICD-10-CM

## 2016-05-25 LAB — GLUCOSE, CAPILLARY
Glucose-Capillary: 138 mg/dL — ABNORMAL HIGH (ref 65–99)
Glucose-Capillary: 157 mg/dL — ABNORMAL HIGH (ref 65–99)
Glucose-Capillary: 131 mg/dL — ABNORMAL HIGH (ref 65–99)
Glucose-Capillary: 136 mg/dL — ABNORMAL HIGH (ref 65–99)

## 2016-05-25 LAB — BASIC METABOLIC PANEL
ANION GAP: 10 (ref 5–15)
BUN: 16 mg/dL (ref 6–20)
CALCIUM: 9.6 mg/dL (ref 8.9–10.3)
CO2: 28 mmol/L (ref 22–32)
CREATININE: 1.16 mg/dL (ref 0.61–1.24)
Chloride: 94 mmol/L — ABNORMAL LOW (ref 101–111)
GFR calc Af Amer: >60 mL/min (ref >60)
GFR, EST NON AFRICAN AMERICAN: 56 mL/min — AB (ref >60)
GLUCOSE: 130 mg/dL — AB (ref 65–99)
Potassium: 4.1 mmol/L (ref 3.5–5.1)
Sodium: 132 mmol/L — ABNORMAL LOW (ref 135–145)

## 2016-05-25 LAB — CBC WITH DIFFERENTIAL/PLATELET
BASOS ABS: 0 10*3/uL (ref 0.0–0.1)
Basophils Relative: 0 %
Eosinophils Absolute: 0.5 10*3/uL (ref 0.0–0.7)
Eosinophils Relative: 8 %
HEMATOCRIT: 35.7 % — AB (ref 39.0–52.0)
Hemoglobin: 11.8 g/dL — ABNORMAL LOW (ref 13.0–17.0)
LYMPHS PCT: 23 %
Lymphs Abs: 1.5 10*3/uL (ref 0.7–4.0)
MCH: 28.6 pg (ref 26.0–34.0)
MCHC: 33.1 g/dL (ref 30.0–36.0)
MCV: 86.4 fL (ref 78.0–100.0)
MONO ABS: 0.7 10*3/uL (ref 0.1–1.0)
MONOS PCT: 11 %
NEUTROS ABS: 3.7 10*3/uL (ref 1.7–7.7)
Neutrophils Relative %: 58 %
PLATELETS: 191 10*3/uL (ref 150–400)
RBC: 4.13 MIL/uL — ABNORMAL LOW (ref 4.22–5.81)
RDW: 16.1 % — AB (ref 11.5–15.5)
WBC: 6.5 10*3/uL (ref 4.0–10.5)

## 2016-05-25 LAB — PROCALCITONIN

## 2016-05-25 MED ORDER — AZITHROMYCIN 500 MG PO TABS
500.0000 mg | ORAL_TABLET | Freq: Every day | ORAL | Status: DC
  Administered 2016-05-25: 500 mg via ORAL
  Filled 2016-05-25: qty 1

## 2016-05-25 NOTE — Progress Notes (Signed)
PROGRESS NOTE   Chad [name]  KYH:[id number]    DOB: [birth year]    DOA: 05/20/2016  PCP: Hoyt Koch, MD   I have briefly reviewed patients previous medical records in [facility].  Brief Narrative:  83 year old male with a PMH of DM 2, COPD, HTN, Barrett's esophagus, chronic diastolic CHF, CVA with residual mild left-sided weakness, dementia, presented to the ED on 05/21/16 with complaints of generalized weakness and a fall at home. As per report, patient developed gradual onset of generalized weakness over past 1-2 weeks prior to admission, progressively worsened and was difficult getting out of bed, then while standing with his walker and attempting to transfer to a chair, fell backwards onto his bottom without loss of consciousness or head strike. He reported pain in the lower back. Chronic loose stools without vomiting. Evaluation revealed L1 compression fracture, sodium 127, creatinine 1.47 and was admitted for evaluation of generalized weakness, fall and hyponatremia.   Assessment & Plan:   Principal Problem:   Hyponatremia Active Problems:   Essential hypertension   COPD GOLD II    Barrett's esophagus   Depression   Controlled diabetes mellitus type 2 with complications (HCC)   History of stroke   General weakness   Fall at home, initial encounter   Closed compression fracture of L1 lumbar vertebra (HCC)   Chronic diastolic CHF (congestive heart failure) (HCC)   Pressure injury of skin   1. Hyponatremia: Initial sodium in ED: 127. Felt to be related to some degree of dehydration and possible contribution from Lasix and Paxil. These meds were temporarily held. Hydrated gently with IV fluids. Sodium has improved and stabilized in the low 130 range. Serum osmolarity: 275, urine osmolarity 398 and urine sodium 53. Not conclusive of SIADH. 2. Acute L1 compression fracture: Sustained status post fall and associated with low back pain. No focal neurological deficits.  IR was consulted and indicated that the earliest he can have VP/KP would be on 05/30/16 (last dose of Plavix 05/23/16). This procedure can be performed as outpatient. 3. Possible community-acquired pneumonia versus aspiration pneumonia, RLL: Patient is a poor historian. Abnormality noted on CT chest in right lower lobe. Empirically started on ceftriaxone & Azithromycin. Follow-up chest x-ray 5/2 shows no active cardiopulmonary disease. Check pro calcitonin and if negative, consider stopping all antibiotics. Recommend follow-up CT chest without contrast as outpatient to ensure resolution of abnormal findings. 4. Generalized weakness and fall: May be multifactorial related to advanced age, declining dementia and multiple significant comorbidities. PT follow-up appreciated and recommend SNF. As per clinical social worker, patient needs to go to the [place] SNF-surgery ongoing. 5. History of CVA: With residual cognitive and left-sided weakness. Continue statins. Plavix held for possible KP/VP next week. Due to mental status changes, obtain CT head without contrast to insure no acute findings. 6. Type II DM: Hemoglobin A1c 7.3 in August 2017. Home metformin held. Treating with NovoLog SSI with reasonable inpatient control. 7. Essential hypertension: Controlled. Continue clonidine and amlodipine. Losartan held due to mild acute kidney injury. 8. Acute kidney injury: Resolved. ARB on hold. 9. Chronic anemia: Stable. 10. GERD/Barrett's esophagus: Continue PPI. 11. Chronic diastolic CHF: Compensated. Lasix currently on hold. 12. COPD: Stable without clinical bronchospasm. 13. Dementia: Somnolent on 4/29 and Narco was discontinued. Noted slightly lethargic and somnolent on 5/2 AM. Noted that patient received Ativan 0.5 MG at bedtime >reduce to 0.25 MG or stop and reassess mental status in a.m. As per spouse, patient was on when  necessary Ativan at home.   DVT prophylaxis: Lovenox Code Status: DO NOT RESUSCITATE Family  Communication: None at bedside Disposition: DC to SNF when medically improved in bed available   Consultants:  IR   Procedures:  None  Antimicrobials:  Ceftriaxone and azithromycin    Subjective: Seen this morning. Slightly lethargic/somnolent but easily arousable and oriented only to self. No complaints reported. As per PT, intermittently complained of low back pain.   ROS: Denied dyspnea, cough or pain when I interviewed him.  Objective:  Vitals:   05/24/16 2159 05/24/16 2200 05/25/16 0543 05/25/16 1006  BP: 135/61  121/69 117/63  Pulse: (!) 58 60 61   Resp: 17  17   Temp: 98.2 F (36.8 C)  98.2 F (36.8 C)   TempSrc: Oral  Oral   SpO2: 95%  96%   Weight:   69.3 kg (152 lb 12.8 oz)   Height:        Examination:  General exam: Pleasant elderly male, seen ambulating in the hall with a walker and assistance from PT. Did not appear in distress. Respiratory system: Diminished breath sounds in the bases but otherwise clear to auscultation. Respiratory effort normal. Cardiovascular system: S1 & S2 heard, RRR. No JVD, murmurs, rubs, gallops or clicks. No pedal edema. Not on telemetry. Gastrointestinal system: Abdomen is nondistended, soft and nontender. No organomegaly or masses felt. Normal bowel sounds heard. Large uncomplicated ventral hernia. Condom catheter +. Central nervous system: Mental status as above.. No focal neurological deficits. Extremities: Symmetric 5 x 5 power. Skin: No rashes, lesions or ulcers Psychiatry: Judgement and insight impaired. Mood & affect appropriate.     Data Reviewed: I have personally reviewed following labs and imaging studies  CBC:  Recent Labs Lab 05/20/16 1846 05/21/16 0538 05/22/16 0915 05/23/16 0408 05/24/16 0331 05/25/16 0521  WBC 10.8* 7.5 13.9* 7.5 7.2 6.5  NEUTROABS 8.2*  --   --   --  4.2 3.7  HGB 12.3* 11.9* 12.4* 10.9* 10.9* 11.8*  HCT 37.3* 35.9* 37.9* 33.7* 33.7* 35.7*  MCV 84.6 84.9 85.6 85.8 85.8 86.4  PLT  210 171 176 161 171 026   Basic Metabolic Panel:  Recent Labs Lab 05/21/16 1400 05/22/16 0915 05/23/16 0408 05/24/16 0331 05/25/16 0521  NA 128* 128* 130* 131* 132*  K 4.6 4.2 4.2 4.2 4.1  CL 93* 94* 96* 96* 94*  CO2 24 23 27 26 28   GLUCOSE 124* 183* 113* 125* 130*  BUN 15 16 15 16 16   CREATININE 1.12 1.17 1.08 1.11 1.16  CALCIUM 9.6 9.5 9.1 9.2 9.6   Liver Function Tests:  Recent Labs Lab 05/20/16 1846 05/22/16 0915 05/23/16 0408  AST 24 15 14*  ALT 22 16* 15*  ALKPHOS 76 63 50  BILITOT 0.5 0.9 0.7  PROT 6.5 6.2* 5.4*  ALBUMIN 3.8 3.3* 2.8*   Coagulation Profile:  Recent Labs Lab 05/23/16 0913  INR 1.06    CBG:  Recent Labs Lab 05/24/16 1140 05/24/16 1741 05/24/16 2201 05/25/16 0757 05/25/16 1146  GLUCAP 151* 129* 132* 138* 157*    No results found for this or any previous visit (from the past 240 hour(s)).       Radiology Studies: Dg Chest 2 View  Result Date: 05/25/2016 CLINICAL DATA:  Dyspnea. EXAM: CHEST  2 VIEW COMPARISON:  Radiographs and CT scan of May 22, 2016. FINDINGS: Cardiac size is within normal limits. No pneumothorax or pleural effusion is noted. Patient is status post gastric pull-through with residual  contrast noted within the portion of the stomach in the chest. No acute pulmonary disease is noted. Bony thorax is unremarkable. Atherosclerosis of thoracic aorta is noted. IMPRESSION: No active cardiopulmonary disease. Status post gastric pull-through procedure. Aortic atherosclerosis. Electronically Signed   By: Marijo Conception, M.D.   On: 05/25/2016 08:33   Dg Swallowing Func-speech Pathology  Result Date: 05/24/2016 Objective Swallowing Evaluation: Type of Study: MBS-Modified Barium Swallow Study Patient Details Name: COLLIN RENGEL MRN: [id number] Date of Birth: 02/18/1929 Today's Date: 05/24/2016 Time: SLP Start Time (ACUTE ONLY): 1320-SLP Stop Time (ACUTE ONLY): 1340 SLP Time Calculation (min) (ACUTE ONLY): 20 min Past Medical  History: Past Medical History: Diagnosis Date . Acid reflux disease  . Arthritis   "all over" . Arthritis  . Barrett's esophagus  . Basal cell carcinoma of nose  . CAROTID ARTERY STENOSIS, BILATERAL 08/27/2008 . Chronic bronchitis ([place])   "most q yr; he's had it several times" (12/20/2013) . COPD (chronic obstructive pulmonary disease) ([place])   "severe" (12/20/2013) . COPD (chronic obstructive pulmonary disease) ([place])  . Coronary artery stenosis  . Diabetes mellitus without complication ([place])  . Diaphragmatic hernia without mention of obstruction or gangrene  . Diverticulitis  . Diverticulosis of colon  . Dyspnea  . Esophageal stenosis  . Fatty liver disease, nonalcoholic  . GERD (gastroesophageal reflux disease)  . Glaucoma  . Glaucoma  . High cholesterol  . History of blood transfusion 2007  "related to OR" . History of hiatal hernia   "repaired when esophagus removed" . History of stomach ulcers  . HTN (hypertension)  . Hyperlipidemia  . Hypertension  . Kidney stones   "passed them all" (12/20/2013) . Memory loss   DEMENTIA . Personal history of other malignant neoplasm of skin  . Pneumonia 1990's X 4 . RENAL CALCULUS, HX OF 08/28/2008 . Renal disorder  . Sinus headache   "seasonal" . Stroke ([place])  . TIA (transient ischemic attack) 1998 X "a few" . Type II diabetes mellitus ([place])  Past Surgical History: Past Surgical History: Procedure Laterality Date . BASAL CELL CARCINOMA EXCISION   . CAROTID ARTERY ANGIOPLASTY   . CAROTID ENDARTERECTOMY Bilateral 02/1996-03/1996 . CATARACT EXTRACTION W/ INTRAOCULAR LENS IMPLANT Bilateral 04/2007 - 09/2007  "left-right" . ESOPHAGECTOMY  2007  with stomach pull through . ESOPHAGOGASTRODUODENOSCOPY (EGD) WITH ESOPHAGEAL DILATION  10/2011 . EYE SURGERY Right 04/2006; 08/2007  "had gas bubbles put in" . EYE SURGERY Right 01/2007  "air bubble" . EYE SURGERY Left 12/12/2013  "cleaned his implant w/laser" . EYE SURGERY   . GLAUCOMA SURGERY Right 11/2008  "implanted drain tube" . HERNIA REPAIR   .  KNEE ARTHROSCOPY Bilateral 04/1989; 08/1996; 06/1999  "right; left; left" . KNEE SURGERY   . MOHS SURGERY  ?2002  "tip of nose; basal cell" . NASAL POLYP EXCISION  2005  "benign" . PARS PLANA VITRECTOMY Bilateral 01/2007-05/2007 . SHOULDER ARTHROSCOPY Right 04/1998 . SHOULDER SURGERY   . VENTRAL HERNIA REPAIR  02/2006 HPI: Patient is an 83 yo male admitted 05/20/16 after a fall. MRI showed L1 compression fx. PMH: DM, COPD, esophagectomy (2007), Barrett esophagus, pna, HTN, CHF, CVA with Lt weakness, dementia, depression. CXR patchy parenchymal changes within the right lung particularly within the lower lobe. The changes in the right lower lobe a likely related to acute inflammatory change. Recent CT revealed Old RIGHT corona radiata/basal ganglia infarct and moderate to severe chronic small vessel ischemic disease. BSE 05/2015 recommended regular/thin, pt impulsive. No Data Recorded Assessment / Plan /  Recommendation CHL IP CLINICAL IMPRESSIONS 05/24/2016 Clinical Impression Pt exhibited a mild oral dysphagia marked by prolonged mastication with solid and mild premature spill with thin. Mild motor impairments led to mild vallecular and pyriform sinsus residue which was cleared during the study. Of note, pt coughed frequently throughout the study however no barium was penetrated or aspirated during the assessment. He is at higher aspiration risk given esophagectomy. Recommend he continue Dys 3 diet/ thin liquids, stay up 1 hour after meals, straws allowed, and pills with thin as tolerated. Will follow up.    SLP Visit Diagnosis Dysphagia, oropharyngeal phase (R13.12) Attention and concentration deficit following -- Frontal lobe and executive function deficit following -- Impact on safety and function (No Data)   CHL IP TREATMENT RECOMMENDATION 05/24/2016 Treatment Recommendations Therapy as outlined in treatment plan below   Prognosis 05/24/2016 Prognosis for Safe Diet Advancement Good Barriers to Reach Goals Cognitive deficits  Barriers/Prognosis Comment -- CHL IP DIET RECOMMENDATION 05/24/2016 SLP Diet Recommendations Dysphagia 3 (Mech soft) solids;Thin liquid Liquid Administration via Cup;Straw Medication Administration Whole meds with liquid Compensations Slow rate;Small sips/bites Postural Changes Seated upright at 90 degrees;Remain semi-upright after after feeds/meals (Comment)   CHL IP OTHER RECOMMENDATIONS 05/24/2016 Recommended Consults -- Oral Care Recommendations Oral care BID Other Recommendations --   CHL IP FOLLOW UP RECOMMENDATIONS 05/24/2016 Follow up Recommendations None   CHL IP FREQUENCY AND DURATION 05/24/2016 Speech Therapy Frequency (ACUTE ONLY) min 2x/week Treatment Duration 2 weeks      CHL IP ORAL PHASE 05/24/2016 Oral Phase Impaired Oral - Pudding Teaspoon -- Oral - Pudding Cup -- Oral - Honey Teaspoon -- Oral - Honey Cup -- Oral - Nectar Teaspoon -- Oral - Nectar Cup -- Oral - Nectar Straw -- Oral - Thin Teaspoon -- Oral - Thin Cup Premature spillage Oral - Thin Straw -- Oral - Puree -- Oral - Mech Soft -- Oral - Regular Delayed oral transit Oral - Multi-Consistency -- Oral - Pill Delayed oral transit Oral Phase - Comment --  CHL IP PHARYNGEAL PHASE 05/24/2016 Pharyngeal Phase Impaired Pharyngeal- Pudding Teaspoon -- Pharyngeal -- Pharyngeal- Pudding Cup -- Pharyngeal -- Pharyngeal- Honey Teaspoon -- Pharyngeal -- Pharyngeal- Honey Cup -- Pharyngeal -- Pharyngeal- Nectar Teaspoon -- Pharyngeal -- Pharyngeal- Nectar Cup -- Pharyngeal -- Pharyngeal- Nectar Straw -- Pharyngeal -- Pharyngeal- Thin Teaspoon -- Pharyngeal -- Pharyngeal- Thin Cup Pharyngeal residue - valleculae;Pharyngeal residue - pyriform;Reduced laryngeal elevation Pharyngeal -- Pharyngeal- Thin Straw -- Pharyngeal -- Pharyngeal- Puree -- Pharyngeal -- Pharyngeal- Mechanical Soft -- Pharyngeal -- Pharyngeal- Regular -- Pharyngeal -- Pharyngeal- Multi-consistency -- Pharyngeal -- Pharyngeal- Pill -- Pharyngeal -- Pharyngeal Comment --  CHL IP CERVICAL ESOPHAGEAL  PHASE 05/24/2016 Cervical Esophageal Phase WFL Pudding Teaspoon -- Pudding Cup -- Honey Teaspoon -- Honey Cup -- Nectar Teaspoon -- Nectar Cup -- Nectar Straw -- Thin Teaspoon -- Thin Cup -- Thin Straw -- Puree -- Mechanical Soft -- Regular -- Multi-consistency -- Pill -- Cervical Esophageal Comment -- No flowsheet data found. Houston Siren 05/24/2016, 3:43 PM Orbie Pyo Colvin Caroli.Ed CCC-SLP Pager [phone]                   Scheduled Meds: . amLODipine  10 mg Oral Daily  . azithromycin  500 mg Oral QHS  . cholecalciferol  1,000 Units Oral Daily  . cloNIDine  0.1 mg Oral Daily  . donepezil  10 mg Oral QHS  . enoxaparin (LOVENOX) injection  40 mg Subcutaneous Daily  . insulin aspart  0-5  Units Subcutaneous QHS  . insulin aspart  0-9 Units Subcutaneous TID WC  . latanoprost  1 drop Left Eye BID  . LORazepam  0.5 mg Oral QHS  . mometasone-formoterol  2 puff Inhalation BID  . pantoprazole  40 mg Oral BID  . saccharomyces boulardii  250 mg Oral BID  . simvastatin  20 mg Oral q1800  . sodium chloride flush  3 mL Intravenous Q12H  . vitamin B-12  1,000 mcg Oral Daily   Continuous Infusions: . cefTRIAXone (ROCEPHIN)  IV Stopped (05/24/16 2216)     LOS: 5 days     Brinn Westby, MD, FACP, FHM. [facility] Pager [phone]  If 7PM-7AM, please contact night-coverage [email or web address] Password [email or web address] 05/25/2016, 2:48 PM

## 2016-05-25 NOTE — Progress Notes (Signed)
  Speech Language Pathology Treatment: Dysphagia  Patient Details Name: Chad [name] MRN: [id number] DOB: Jul 10, 1929 Today's Date: 05/25/2016 Time: 9563-8756 SLP Time Calculation (min) (ACUTE ONLY): 10 min  Assessment / Plan / Recommendation Clinical Impression  RN tech denied pt coughing today during breakfast but "did a little yesterday because the eggs were drier". No s/s aspiration while taking pill with water with RN. Wife states he usually takes 3 small ones at a time which is down from a handful". SLP educated using puree if difficulty in the future. Wife is always present with meals and is very compliant with precautions. Recommend pt continue Dys 3 texture, thin liquids, no straws and reflux precautions. No further ST needed at this time.    HPI HPI: Patient is an 83 yo male admitted 05/20/16 after a fall. MRI showed L1 compression fx. PMH: DM, COPD, esophagectomy (2007), Barrett esophagus, pna, HTN, CHF, CVA with Lt weakness, dementia, depression. CXR patchy parenchymal changes within the right lung particularly within the lower lobe. The changes in the right lower lobe a likely related to acute inflammatory change. Recent CT revealed Old RIGHT corona radiata/basal ganglia infarct and moderate to severe chronic small vessel ischemic disease. BSE 05/2015 recommended regular/thin, pt impulsive.      SLP Plan  All goals met;Discharge SLP treatment due to (comment)       Recommendations  Diet recommendations: Dysphagia 3 (mechanical soft);Thin liquid Liquids provided via: Cup;No straw Medication Administration: Whole meds with liquid Supervision: Patient able to self feed;Full supervision/cueing for compensatory strategies Compensations: Slow rate;Small sips/bites Postural Changes and/or Swallow Maneuvers: Seated upright 90 degrees;Upright 30-60 min after meal                Oral Care Recommendations: Oral care BID Follow up Recommendations: None SLP Visit Diagnosis:  Dysphagia, oropharyngeal phase (R13.12) Plan: All goals met;Discharge SLP treatment due to (comment)       GO                Houston Siren 05/25/2016, 12:22 PM  Orbie Pyo Colvin Caroli.Ed [employer] [phone]

## 2016-05-25 NOTE — Progress Notes (Signed)
Physical Therapy Treatment Patient Details Name: Chad [name] MRN: [id number] DOB: 10-Jul-1929 Today's Date: 05/25/2016    History of Present Illness Patient is an 83 yo male admitted 05/20/16 after a fall.  MRI showed L1 compression fx.    PMH:  DM, COPD, HTN, CHF, CVA with Lt weakness, dementia, depression    PT Comments    Pt still expressing pain.  He infrequently localizes it to the back, but often will not specify.  Today pt was able to follow physical cues to initiate and follow through with basic bed mobility and gait with RW.   Follow Up Recommendations  SNF;Supervision/Assistance - 24 hour     Equipment Recommendations  Other (comment) (TBA)    Recommendations for Other Services       Precautions / Restrictions Precautions Precautions: Fall Precaution Comments: History of falls at home Restrictions Weight Bearing Restrictions: No    Mobility  Bed Mobility Overal bed mobility: Needs Assistance Bed Mobility: Supine to Sit;Sit to Supine     Supine to sit: Max assist     General bed mobility comments: With physical cuing and extra processing time, pt able to assist once sitting up initiated.  Transfers Overall transfer level: Needs assistance Equipment used: Rolling walker (2 wheeled) Transfers: Sit to/from Stand Sit to Stand: Min assist         General transfer comment: Again, pt followed through task with min assist once physically initiated.  Ambulation/Gait Ambulation/Gait assistance: Min assist Ambulation Distance (Feet): 120 Feet Assistive device: Rolling walker (2 wheeled) Gait Pattern/deviations: Step-through pattern Gait velocity: slower Gait velocity interpretation: Below normal speed for age/gender General Gait Details: pt could be kept ambulating in the RW with physical cues and verbal encouragement.  Gait characterized with short, halting steps, needing cues to keep going   Stairs            Wheelchair Mobility    Modified  Rankin (Stroke Patients Only)       Balance Overall balance assessment: Needs assistance;No apparent balance deficits (not formally assessed) Sitting-balance support: Bilateral upper extremity supported Sitting balance-Leahy Scale: Poor Sitting balance - Comments: pt now able to sit EOB without physical assist     Standing balance-Leahy Scale: Poor Standing balance comment: moderate use of the RW                            Cognition Arousal/Alertness: Awake/alert Behavior During Therapy: Flat affect Overall Cognitive Status: History of cognitive impairments - at baseline                                        Exercises      General Comments        Pertinent Vitals/Pain Pain Assessment: Faces Faces Pain Scale: Hurts little more Pain Location: ?back Pain Descriptors / Indicators: Grimacing Pain Intervention(s): Monitored during session    Home Living                      Prior Function            PT Goals (current goals can now be found in the care plan section) Acute Rehab PT Goals Patient Stated Goal: Patient unable to state PT Goal Formulation: Patient unable to participate in goal setting Time For Goal Achievement: 05/29/16 Potential to Achieve Goals: Fair Progress towards PT  goals: Progressing toward goals    Frequency    Min 3X/week      PT Plan Current plan remains appropriate    Co-evaluation              AM-PAC PT "6 Clicks" Daily Activity  Outcome Measure  Difficulty turning over in bed (including adjusting bedclothes, sheets and blankets)?: Total Difficulty moving from lying on back to sitting on the side of the bed? : Total Difficulty sitting down on and standing up from a chair with arms (e.g., wheelchair, bedside commode, etc,.)?: Total Help needed moving to and from a bed to chair (including a wheelchair)?: A Lot Help needed walking in hospital room?: A Lot Help needed climbing 3-5 steps with  a railing? : A Lot 6 Click Score: 9    End of Session   Activity Tolerance: Patient tolerated treatment well Patient left: in chair;with call bell/phone within reach;with chair alarm set Nurse Communication: Mobility status PT Visit Diagnosis: Other abnormalities of gait and mobility (R26.89);History of falling (Z91.81);Muscle weakness (generalized) (M62.81);Pain     Time: 1610-9604 PT Time Calculation (min) (ACUTE ONLY): 27 min  Charges:  $Gait Training: 8-22 mins $Therapeutic Activity: 8-22 mins                    G Codes:       2016-05-31  Donnella Sham, PT [phone]  (pager)   Chad [name] May 31, 2016, 1:07 PM  2016/05/31  Donnella Sham, PT [phone]  (pager)

## 2016-05-26 LAB — GLUCOSE, CAPILLARY
GLUCOSE-CAPILLARY: 135 mg/dL — AB (ref 65–99)
GLUCOSE-CAPILLARY: 221 mg/dL — AB (ref 65–99)
GLUCOSE-CAPILLARY: 100 mg/dL — AB (ref 65–99)
Glucose-Capillary: 123 mg/dL — ABNORMAL HIGH (ref 65–99)

## 2016-05-26 MED ORDER — LORAZEPAM 0.5 MG PO TABS: 0.2500 mg | ORAL_TABLET | Freq: Four times a day (QID) | ORAL | Status: AC | PRN

## 2016-05-26 MED ORDER — ACETAMINOPHEN 325 MG PO TABS: 650.0000 mg | ORAL_TABLET | Freq: Four times a day (QID) | ORAL | Status: AC | PRN

## 2016-05-26 NOTE — Care Management Note (Addendum)
Case Management Note  Patient Details  Name: Chad [name] MRN: [id number] Date of Birth: [birth year]  Subjective/Objective:     Admitted with generalized weakness and a fall at home, hx of of DM 2, COPD, HTN, Barrett's esophagus, chronic diastolic CHF, CVA with residual mild left-sided weakness, dementia. Pt resides with wife . [facility]  Chad [name] (Spouse)     [phone]       Action/Plan: CM to f/u with Margarita Sermons CSW/ [facility] [phone]. [id number] in am regarding home health services. Plan is to d/c to home in am  with home health services. PTAR will be needed to transport pt to home.   Expected Discharge Date:  05/26/16               Expected Discharge Plan:  [place]  In-House Referral:  Clinical Social Work  Discharge planning Services  CM Consult  Post Acute Care Choice:    Choice offered to:  Spouse, Patient  DME Arranged:    DME Agency:    HH Arranged:  PT, OT, RN, Nurse's Aide, Social Work [employer] Agency:  [facility] referral made with [place] @ [phone] via [place].  Status of Service:  Completed, signed off  If discussed at [place] of Stay Meetings, dates discussed:    Additional Comments:  Sharin Mons, RN 05/26/2016, 5:56 PM

## 2016-05-26 NOTE — Progress Notes (Signed)
[facility] SNFs unable to accept patient today and patient is medically ready for discharge. CSW discussed options with patient's wife. She reported being unable to pay copays at [facility] even for a few days and stated that she would take patient home with home health and PTAR. CSW alerted RNCM, MD, and RN.  CSW signing off.  Chad [name] LCSWA [phone]

## 2016-05-26 NOTE — Progress Notes (Signed)
CSW and RNCM spoke with patient's wife regarding discharge planning. Wife provided Dr Sherral Hammers phone number: [phone] ext [phone] Jeannene Patella is RN to coordinate home health services.  Percell Locus Jacorian Golaszewski LCSWA [phone]

## 2016-05-26 NOTE — Discharge Summary (Signed)
Physician Discharge Summary  JEANMARC VIERNES ZOX:[id number] DOB: [birth year]  PCP: Hoyt Koch, MD  Admit date: 05/20/2016 Discharge date: 05/26/2016  Recommendations for Outpatient Follow-up:  1. Dr. Pricilla Holm, PCP in 5 days with repeat labs (CBC & BMP). [facility]: Radiology will arrange outpatient follow-up for procedure (kyphoplasty or vertebroplasty). Last dose of Plavix on 05/23/16. Please resume Plavix post procedure when okay with radiology. 3. Consider outpatient follow-up CT chest without contrast to evaluate abnormal findings that were seen on CT chest 05/22/16.  Home Health: PT, OT, RN, aide and signs clinical Education officer, museum. Equipment/Devices: None    Discharge Condition: Improved and stable  CODE STATUS: DO NOT RESUSCITATE  Diet recommendation: Dysphagia 3 diet and thin liquids, heart healthy and diabetic.  Discharge Diagnoses:  Principal Problem:   Hyponatremia Active Problems:   Essential hypertension   COPD GOLD II    Barrett's esophagus   Depression   Controlled diabetes mellitus type 2 with complications (HCC)   History of stroke   General weakness   Fall at home, initial encounter   Closed compression fracture of L1 lumbar vertebra (HCC)   Chronic diastolic CHF (congestive heart failure) (HCC)   Pressure injury of skin   Brief Summary: 83 year old male with a PMH of DM 2, COPD, HTN, status post esophagectomy and gastric pull-through, Barrett's esophagus, chronic diastolic CHF, CVA with residual mild left-sided weakness, dementia, presented to the ED on 05/21/16 with complaints of generalized weakness and a fall at home. As per report, patient developed gradual onset of generalized weakness over past 1-2 weeks prior to admission, progressively worsened and was difficult getting out of bed, then while standing with his walker and attempting to transfer to a chair, fell backwards onto his bottom without loss of consciousness or head  strike. He reported pain in the lower back. Chronic loose stools without vomiting. Evaluation revealed L1 compression fracture, sodium 127, creatinine 1.47 and was admitted for evaluation of generalized weakness, fall and hyponatremia.   Assessment & Plan:   1. Hyponatremia: Initial sodium in ED: 127. Felt to be related to some degree of dehydration and possible contribution from Lasix and Paxil. These meds were temporarily held. Hydrated gently with IV fluids. Sodium has improved and stabilized in the low 130 range. Serum osmolarity: 275, urine osmolarity 398 and urine sodium 53. Not conclusive of SIADH. Follow BMP as outpatient in a few days. 2. Acute L1 compression fracture: Sustained status post fall and associated with low back pain. No focal neurological deficits. IR was consulted and indicated that the earliest he can have VP/KP would be on 05/30/16 (last dose of Plavix 05/23/16). This procedure can be performed as outpatient. Radiology will call patient with appointment for the procedure. Plavix to be resumed post procedure when okay with radiology. 3. Possible community-acquired pneumonia versus aspiration pneumonia, RLL: Patient is a poor historian. Abnormality noted on CT chest in right lower lobe. Empirically started on ceftriaxone & Azithromycin. Follow-up chest x-ray 5/2 shows no active cardiopulmonary disease. Pro-calcitonin negative and all antibiotics were discontinued. Recommend follow-up CT chest without contrast as outpatient to ensure resolution of abnormal findings. 4. Generalized weakness and fall: May be multifactorial related to advanced age, declining dementia and multiple significant comorbidities. PT follow-up appreciated and recommended SNF. As per clinical social worker update today[facility] and family unable to pay co-pays at other SNF and patient is medically stable for discharge. Discussed with case management. [facility] with [facility]. 5. History of CVA: With residual cognitive and left-sided weakness. Continue statins. Plavix held for possible KP/VP next week. Due to mental status changes, obtained CT head without contrast on 5/2 which had no acute findings. His mental status changes probably were related to medications/lorazepam. Resume Plavix when cleared by interventional radiology after procedure. Sees Neurology as OP. 6. Type II DM: Hemoglobin A1c 7.3 in August 2017. Metformin was held in the hospital and patient was treated with sliding scale insulin. Resume metformin at discharge. 7. Essential hypertension:  losartan was briefly held in the hospital due to admission with acute kidney injury. Resume losartan at discharge with close follow-up of BMP as outpatient. It appears that patient was on Lasix at home for hypertension and this has been discontinued due to risk of dehydration and acute kidney injury. Clinically patient is euvolemic. Close outpatient follow-up with PCP and may consider initiating if CHF becomes an issue. Continue amlodipine and home dose of when necessary clonidine. 8. Acute kidney injury: Resolved. ARB briefly held and resumed at discharge. 9. Chronic anemia: Stable. 10. GERD/Barrett's esophagus: Continue PPI. 11. Chronic diastolic CHF: Compensated. As per patient's spouse's report, patient has had issues recurrent dehydration and kidney issues. Clinically euvolemic. At risk for recurrent dehydration and acute kidney injury hence Lasix discontinued. Reassess during close outpatient follow-up. 12. COPD: Stable without clinical bronchospasm. Continue home medications. 13. Dementia: Somnolent on 4/29 and [place] was discontinued. Noted slightly lethargic and somnolent on 5/2 AM. Noted that patient received Ativan 0.5 MG at bedtime-discontinued. As discussed in detail multiple times with spouse, patient apparently has been written up for Ativan 0.25 MG every 6 hourly when necessary for anxiety which he  occasional he takes. He does not take scheduled Ativan. Bedtime Ativan was discontinued. As per spouse, mental status is back to baseline. Continue Aricept and home dose of Paxil.   Consultants:  IR   Procedures:  None  Discharge Instructions  Discharge Instructions    Call MD for:  difficulty breathing, headache or visual disturbances    Complete by:  As directed    Call MD for:  extreme fatigue    Complete by:  As directed    Call MD for:  persistant dizziness or light-headedness    Complete by:  As directed    Call MD for:  persistant nausea and vomiting    Complete by:  As directed    Call MD for:  severe uncontrolled pain    Complete by:  As directed    Call MD for:  temperature >100.4    Complete by:  As directed    Diet - low sodium heart healthy    Complete by:  As directed    Diet Carb Modified    Complete by:  As directed    Discharge instructions    Complete by:  As directed    Plavix was discontinued in the hospital because he is supposed to have a back procedure by radiology early next week. Restart Plavix when cleared by radiologist to do so after the procedure.   Discharge instructions    Complete by:  As directed    Diet consistency: As per speech therapy recommendations as follows,  Diet recommendations: Dysphagia 3 (mechanical soft);Thin liquid Liquids provided via: Cup;No straw Medication Administration: Whole meds with liquid Supervision: Patient able to self feed;Full supervision/cueing for compensatory strategies Compensations: Slow rate;Small sips/bites Postural Changes and/or Swallow Maneuvers: Seated upright 90 degrees;Upright 30-60 min after meal  Increase activity slowly    Complete by:  As directed        Medication List    STOP taking these medications   clopidogrel 75 MG tablet Commonly known as:  PLAVIX   furosemide 20 MG tablet Commonly known as:  LASIX   potassium chloride SA 20 MEQ tablet Commonly known as:  K-DUR,KLOR-CON      TAKE these medications   acetaminophen 325 MG tablet Commonly known as:  TYLENOL Take 2 tablets (650 mg total) by mouth every 6 (six) hours as needed for mild pain, moderate pain, fever or headache (or Fever >/= 101). What changed:  medication strength  reasons to take this   albuterol 108 (90 Base) MCG/ACT inhaler Commonly known as:  PROVENTIL HFA;VENTOLIN HFA Inhale 2 puffs into the lungs every 4 (four) hours as needed for wheezing or shortness of breath.   albuterol (2.5 MG/3ML) 0.083% nebulizer solution Commonly known as:  PROVENTIL Take 2.5 mg by nebulization every 4 (four) hours as needed for wheezing or shortness of breath.   amLODipine 10 MG tablet Commonly known as:  NORVASC TAKE 1 TABLET(10 MG) BY MOUTH DAILY   budesonide-formoterol 160-4.5 MCG/ACT inhaler Commonly known as:  SYMBICORT Inhale 2 puffs into the lungs 2 (two) times daily.   cloNIDine 0.2 MG tablet Commonly known as:  CATAPRES TAKE ONE-HALF TABLET BY MOUTH DAILY AS NEEDED ONLY IF SYSTOLIC IS OVER 889   donepezil 10 MG tablet Commonly known as:  ARICEPT Take 1 tablet (10 mg total) by mouth at bedtime.   latanoprost 0.005 % ophthalmic solution Commonly known as:  XALATAN Place 1 drop into the left eye 2 (two) times daily.   LORazepam 0.5 MG tablet Commonly known as:  ATIVAN Take 0.5 tablets (0.25 mg total) by mouth every 6 (six) hours as needed for anxiety. This is prior home medication at home dosage. What changed:  how much to take  when to take this  reasons to take this  additional instructions   losartan 100 MG tablet Commonly known as:  COZAAR Take 1 tablet (100 mg total) by mouth daily.   metFORMIN 500 MG tablet Commonly known as:  GLUCOPHAGE Take 1 tablet (500 mg total) by mouth 2 (two) times daily with a meal.   omeprazole 20 MG capsule Commonly known as:  PRILOSEC Take 20 mg by mouth 2 (two) times daily before a meal. What changed:  Another medication with the same name  was removed. Continue taking this medication, and follow the directions you see here.   PARoxetine 20 MG tablet Commonly known as:  PAXIL Take 1 tablet (20 mg total) by mouth daily.   PRESERVISION AREDS 2 Caps Take 1 capsule by mouth 2 (two) times daily.   promethazine 25 MG tablet Commonly known as:  PHENERGAN Take 12.5 mg by mouth every 8 (eight) hours as needed for nausea or vomiting.   simvastatin 20 MG tablet Commonly known as:  ZOCOR TAKE 1 TABLET BY MOUTH DAILY AT 6 PM   vitamin B-12 1000 MCG tablet Commonly known as:  CYANOCOBALAMIN Take 1,000 mcg by mouth daily.   Vitamin D 1000 units capsule Take 1,000 Units by mouth daily.      Follow-up Information    Hoyt Koch, MD Follow up in 5 day(s).   Specialty:  Internal Medicine Why:  To be seen with repeat labs (CBC & BMP). Contact information: [address] [place] [id number] [phone]  Allergies  Allergen Reactions  . Moxifloxacin Anaphylaxis    Avelox REACTION: anaphylaxis  . Quinolones Anaphylaxis  . Sulfonamide Derivatives Hives  . Timolol Maleate Other (See Comments)    Causes severe chest congestion  . Amoxicillin Other (See Comments)    Rash, sweating, sob Has patient had a PCN reaction causing immediate rash, facial/tongue/throat swelling, SOB or lightheadedness with hypotension: Yes Has patient had a PCN reaction causing severe rash involving mucus membranes or skin necrosis: Yes Has patient had a PCN reaction that required hospitalization Yes Has patient had a PCN reaction occurring within the last 10 years: Yes If all of the above answers are "NO", then may proceed with Cephalosporin use.   . Augmentin [Amoxicillin-Pot Clavulanate] Other (See Comments)    Anaphylaxis  Has patient had a PCN reaction causing immediate rash, facial/tongue/throat swelling, SOB or lightheadedness with hypotension: Yes Has patient had a PCN reaction causing severe rash involving mucus  membranes or skin necrosis: Yes Has patient had a PCN reaction that required hospitalization Yes Has patient had a PCN reaction occurring within the last 10 years: Yes If all of the above answers are "NO", then may proceed with Cephalosporin use.   . Sulfa Antibiotics Rash      Procedures/Studies: Dg Chest 2 View  Result Date: 05/25/2016 CLINICAL DATA:  Dyspnea. EXAM: CHEST  2 VIEW COMPARISON:  Radiographs and CT scan of May 22, 2016. FINDINGS: Cardiac size is within normal limits. No pneumothorax or pleural effusion is noted. Patient is status post gastric pull-through with residual contrast noted within the portion of the stomach in the chest. No acute pulmonary disease is noted. Bony thorax is unremarkable. Atherosclerosis of thoracic aorta is noted. IMPRESSION: No active cardiopulmonary disease. Status post gastric pull-through procedure. Aortic atherosclerosis. Electronically Signed   By: Marijo Conception, M.D.   On: 05/25/2016 08:33   Dg Chest 2 View  Result Date: 05/22/2016 CLINICAL DATA:  Previous esophagectomy with gastric pull-through procedure. Chest pain EXAM: CHEST  2 VIEW COMPARISON:  May 20, 2016 FINDINGS: The mean L1 vertebral body fracture appear stable compared to recent prior study. There is soft tissue prominence posterior to the left heart, likely of postoperative etiology. There is no new opacity. No frank edema or consolidation. Heart size and pulmonary vascular normal. No adenopathy. There is aortic atherosclerosis. IMPRESSION: Opacity posterior to the left heart is likely due to previous gastric pull-through procedure. No frank edema or consolidation. Aortic atherosclerosis. Stable appearing anterior wedge fracture of the L1 vertebral body. Electronically Signed   By: Lowella Grip III M.D.   On: 05/22/2016 13:07   Dg Chest 2 View  Result Date: 05/20/2016 CLINICAL DATA:  83 y/o M; status post fall onto back striking the right mid thorax. EXAM: CHEST  2 VIEW  THORACIC SPINE TWO OR MORE VIEWS COMPARISON:  11/27/2015 chest radiograph FINDINGS: Chest radiograph: Normal cardiac silhouette. Aortic atherosclerosis with calcification. Clear lungs. No pleural effusion or pneumothorax. No acute rib fracture identified. Mild interval anterior loss of height of L1 vertebral body. Thoracic spine radiograph: Mild interval age-indeterminate loss of the L1 vertebral body. Mild lower thoracic dextrocurvature. The advanced degenerative changes of the cervicothoracic junction. IMPRESSION: 1. Mild interval anterior loss of height of the L1 vertebral body, age indeterminate compression fracture. 2. No acute pulmonary process. Electronically Signed   By: Kristine Garbe M.D.   On: 05/20/2016 19:22   Dg Thoracic Spine 2 View  Result Date: 05/20/2016 CLINICAL DATA:  83 y/o M; status  post fall onto back striking the right mid thorax. EXAM: CHEST  2 VIEW THORACIC SPINE TWO OR MORE VIEWS COMPARISON:  11/27/2015 chest radiograph FINDINGS: Chest radiograph: Normal cardiac silhouette. Aortic atherosclerosis with calcification. Clear lungs. No pleural effusion or pneumothorax. No acute rib fracture identified. Mild interval anterior loss of height of L1 vertebral body. Thoracic spine radiograph: Mild interval age-indeterminate loss of the L1 vertebral body. Mild lower thoracic dextrocurvature. The advanced degenerative changes of the cervicothoracic junction. IMPRESSION: 1. Mild interval anterior loss of height of the L1 vertebral body, age indeterminate compression fracture. 2. No acute pulmonary process. Electronically Signed   By: Kristine Garbe M.D.   On: 05/20/2016 19:22   Ct Head Wo Contrast  Result Date: 05/25/2016 CLINICAL DATA:  Altered mental status EXAM: CT HEAD WITHOUT CONTRAST TECHNIQUE: Contiguous axial images were obtained from the base of the skull through the vertex without intravenous contrast. COMPARISON:  11/27/2015 FINDINGS: Brain: Diffuse atrophic  changes are identified. Mild chronic white matter ischemic change is seen. No findings to suggest acute hemorrhage, acute infarction or space-occupying mass lesion are seen. Vascular: No hyperdense vessel or unexpected calcification. Skull: Normal. Negative for fracture or focal lesion. Sinuses/Orbits: No acute finding. Postsurgical changes are noted in the right orbit. Other: None IMPRESSION: Chronic atrophic and ischemic changes without acute abnormality. These results were called by telephone at the time of interpretation on 05/25/2016 at 6:27 pm to Dr. Vernell Leep , who verbally acknowledged these results. Electronically Signed   By: Inez Catalina M.D.   On: 05/25/2016 18:29   Ct Chest W Contrast  Result Date: 05/22/2016 CLINICAL DATA:  SIADH EXAM: CT CHEST, ABDOMEN, AND PELVIS WITH CONTRAST TECHNIQUE: Multidetector CT imaging of the chest, abdomen and pelvis was performed following the standard protocol during bolus administration of intravenous contrast. CONTRAST:  41mL ISOVUE-300 IOPAMIDOL (ISOVUE-300) INJECTION 61% COMPARISON:  05/21/2016. FINDINGS: CT CHEST FINDINGS Cardiovascular: Thoracic aorta demonstrates atherosclerotic calcifications without aneurysmal dilatation or dissection. The pulmonary artery as visualized is within normal limits. Coronary calcifications are seen. No significant cardiac enlargement is noted. Mediastinum/Nodes: No significant hilar or mediastinal adenopathy is noted. The thoracic inlet is within normal limits. Changes consistent with gastric pull-through. The residual esophagus is somewhat patulous. Lungs/Pleura: Biapical scarring is noted. Patchy ground-glass changes are noted in the right upper lobe posteriorly best seen on image number 56. Patchy infiltrative changes are noted within the right lower lobe as well. Some scarring is noted in the anterior aspect of the left upper lobe. Mild left basilar atelectasis is seen. Musculoskeletal: Degenerative changes of the thoracic  spine are noted. No acute rib fracture is seen. CT ABDOMEN PELVIS FINDINGS Hepatobiliary: No focal liver abnormality is seen. No gallstones, gallbladder wall thickening, or biliary dilatation. Pancreas: The pancreas is predominately fatty infiltrated with the exception of the uncinate process. It is also somewhat superiorly displaced related to the gastric pull-through. Spleen: Normal in size without focal abnormality. Adrenals/Urinary Tract: The adrenal glands are within normal limits. Nonobstructing renal calculi are noted on the left. The largest of these is noted in the mid to upper pole measuring approximately 6 mm in greatest dimension. No obstructive changes are seen. The bladder is well distended. Few small right renal calculi are noted. Scattered renal cysts are seen. Stomach/Bowel: Stomach is within normal limits. Appendix appears normal. Scattered diverticular changes noted without diverticulitis. No evidence of bowel wall thickening, distention, or inflammatory changes. Vascular/Lymphatic: Aortic atherosclerosis. No enlarged abdominal or pelvic lymph nodes. Reproductive: Prostate is unremarkable.  Other: Changes consistent with prior hernia repair are noted. There remains some laxity of the abdominal wall in the midline. Musculoskeletal: Degenerative changes of lumbar spine are seen. Additionally at L1 compression deformity is noted similar to that seen on recent MRI. IMPRESSION: Patchy parenchymal changes within the right lung particularly within the lower lobe. The changes in the right lower lobe a likely related to acute inflammatory change. Short-term followup following appropriate therapy is recommended. This could be accomplished with a noncontrast CT. Changes consistent with prior esophagectomy and gastric pull through. L1 compression deformity similar to that seen on recent MRI. No focal neoplasm is noted. Diverticulosis without diverticulitis. Bilateral nonobstructing renal stones worse on the  left than the right. Electronically Signed   By: Inez Catalina M.D.   On: 05/22/2016 18:21   Mr Lumbar Spine Wo Contrast  Result Date: 05/21/2016 CLINICAL DATA:  Patient with pain. Dementia. Recent fall onto back, striking the mid thorax. EXAM: MRI LUMBAR SPINE WITHOUT CONTRAST TECHNIQUE: Multiplanar, multisequence MR imaging of the lumbar spine was performed. No intravenous contrast was administered. COMPARISON:  Thoracic radiographs 05/20/2016. FINDINGS: The patient was unable to remain motionless for the exam. Small or subtle lesions could be overlooked. Segmentation:  Standard. Alignment:  3 mm of anterolisthesis L4-5, facet mediated. Vertebrae: Acute compression fracture of L1, bone marrow edema along a transverse plane through the superior aspect of the vertebral body. No involvement of the pedicles or posterior elements. Superior endplate depression, without significant anterior loss of vertebral body height. No retropulsion. LEFT greater than RIGHT paravertebral hematoma extends cephalad along the lateral margin of T12. No epidural hematoma. No other vertebral body compression fracture is observed. Conus medullaris: Extends to the L1 level and appears normal. No evidence for conus contusion. Paraspinal and other soft tissues: Unremarkable. Disc levels: L1-L2:  Unremarkable. L2-L3:  Unremarkable. L3-L4:  Unremarkable. L4-L5: 3 mm anterolisthesis is facet mediated. There is a superimposed broad-based disc protrusion contributing to mild stenosis. Either L5 nerve root could be affected. No significant foraminal narrowing. L5-S1: Disc space narrowing. Central protrusion. Posterior element hypertrophy. No impingement. IMPRESSION: Findings consistent with a benign, osteopenic, posttraumatic L1 fracture, primarily superior endplate depression. The appearance is stable compared with yesterday's plain films. The patient may be a candidate for vertebral augmentation. Please consult Interventional Radiology if  clinically indicated. Degenerative anterolisthesis of 3 mm at L4-5, compounded by a broad-based disc protrusion contributing to mild stenosis. Either L5 nerve root could be affected. Electronically Signed   By: Staci Righter M.D.   On: 05/21/2016 12:37   Ct Abdomen Pelvis W Contrast  Result Date: 05/22/2016 CLINICAL DATA:  SIADH EXAM: CT CHEST, ABDOMEN, AND PELVIS WITH CONTRAST TECHNIQUE: Multidetector CT imaging of the chest, abdomen and pelvis was performed following the standard protocol during bolus administration of intravenous contrast. CONTRAST:  24mL ISOVUE-300 IOPAMIDOL (ISOVUE-300) INJECTION 61% COMPARISON:  05/21/2016. FINDINGS: CT CHEST FINDINGS Cardiovascular: Thoracic aorta demonstrates atherosclerotic calcifications without aneurysmal dilatation or dissection. The pulmonary artery as visualized is within normal limits. Coronary calcifications are seen. No significant cardiac enlargement is noted. Mediastinum/Nodes: No significant hilar or mediastinal adenopathy is noted. The thoracic inlet is within normal limits. Changes consistent with gastric pull-through. The residual esophagus is somewhat patulous. Lungs/Pleura: Biapical scarring is noted. Patchy ground-glass changes are noted in the right upper lobe posteriorly best seen on image number 56. Patchy infiltrative changes are noted within the right lower lobe as well. Some scarring is noted in the anterior aspect of the left upper  lobe. Mild left basilar atelectasis is seen. Musculoskeletal: Degenerative changes of the thoracic spine are noted. No acute rib fracture is seen. CT ABDOMEN PELVIS FINDINGS Hepatobiliary: No focal liver abnormality is seen. No gallstones, gallbladder wall thickening, or biliary dilatation. Pancreas: The pancreas is predominately fatty infiltrated with the exception of the uncinate process. It is also somewhat superiorly displaced related to the gastric pull-through. Spleen: Normal in size without focal abnormality.  Adrenals/Urinary Tract: The adrenal glands are within normal limits. Nonobstructing renal calculi are noted on the left. The largest of these is noted in the mid to upper pole measuring approximately 6 mm in greatest dimension. No obstructive changes are seen. The bladder is well distended. Few small right renal calculi are noted. Scattered renal cysts are seen. Stomach/Bowel: Stomach is within normal limits. Appendix appears normal. Scattered diverticular changes noted without diverticulitis. No evidence of bowel wall thickening, distention, or inflammatory changes. Vascular/Lymphatic: Aortic atherosclerosis. No enlarged abdominal or pelvic lymph nodes. Reproductive: Prostate is unremarkable. Other: Changes consistent with prior hernia repair are noted. There remains some laxity of the abdominal wall in the midline. Musculoskeletal: Degenerative changes of lumbar spine are seen. Additionally at L1 compression deformity is noted similar to that seen on recent MRI. IMPRESSION: Patchy parenchymal changes within the right lung particularly within the lower lobe. The changes in the right lower lobe a likely related to acute inflammatory change. Short-term followup following appropriate therapy is recommended. This could be accomplished with a noncontrast CT. Changes consistent with prior esophagectomy and gastric pull through. L1 compression deformity similar to that seen on recent MRI. No focal neoplasm is noted. Diverticulosis without diverticulitis. Bilateral nonobstructing renal stones worse on the left than the right. Electronically Signed   By: Inez Catalina M.D.   On: 05/22/2016 18:21   Dg Swallowing Func-speech Pathology  Result Date: 05/24/2016 Objective Swallowing Evaluation: Type of Study: MBS-Modified Barium Swallow Study Patient Details Name: CAILEAN HEACOCK MRN: [id number] Date of Birth: 1929/02/22 Today's Date: 05/24/2016 Time: SLP Start Time (ACUTE ONLY): 1320-SLP Stop Time (ACUTE ONLY): 1340 SLP Time  Calculation (min) (ACUTE ONLY): 20 min Past Medical History: Past Medical History: Diagnosis Date . Acid reflux disease  . Arthritis   "all over" . Arthritis  . Barrett's esophagus  . Basal cell carcinoma of nose  . CAROTID ARTERY STENOSIS, BILATERAL 08/27/2008 . Chronic bronchitis ([place])   "most q yr; he's had it several times" (12/20/2013) . COPD (chronic obstructive pulmonary disease) ([place])   "severe" (12/20/2013) . COPD (chronic obstructive pulmonary disease) ([place])  . Coronary artery stenosis  . Diabetes mellitus without complication ([place])  . Diaphragmatic hernia without mention of obstruction or gangrene  . Diverticulitis  . Diverticulosis of colon  . Dyspnea  . Esophageal stenosis  . Fatty liver disease, nonalcoholic  . GERD (gastroesophageal reflux disease)  . Glaucoma  . Glaucoma  . High cholesterol  . History of blood transfusion 2007  "related to OR" . History of hiatal hernia   "repaired when esophagus removed" . History of stomach ulcers  . HTN (hypertension)  . Hyperlipidemia  . Hypertension  . Kidney stones   "passed them all" (12/20/2013) . Memory loss   DEMENTIA . Personal history of other malignant neoplasm of skin  . Pneumonia 1990's X 4 . RENAL CALCULUS, HX OF 08/28/2008 . Renal disorder  . Sinus headache   "seasonal" . Stroke ([place])  . TIA (transient ischemic attack) 1998 X "a few" . Type II diabetes mellitus [facility])  Past Surgical  History: Past Surgical History: Procedure Laterality Date . BASAL CELL CARCINOMA EXCISION   . CAROTID ARTERY ANGIOPLASTY   . CAROTID ENDARTERECTOMY Bilateral 02/1996-03/1996 . CATARACT EXTRACTION W/ INTRAOCULAR LENS IMPLANT Bilateral 04/2007 - 09/2007  "left-right" . ESOPHAGECTOMY  2007  with stomach pull through . ESOPHAGOGASTRODUODENOSCOPY (EGD) WITH ESOPHAGEAL DILATION  10/2011 . EYE SURGERY Right 04/2006; 08/2007  "had gas bubbles put in" . EYE SURGERY Right 01/2007  "air bubble" . EYE SURGERY Left 12/12/2013  "cleaned his implant w/laser" . EYE SURGERY   . GLAUCOMA SURGERY Right  11/2008  "implanted drain tube" . HERNIA REPAIR   . KNEE ARTHROSCOPY Bilateral 04/1989; 08/1996; 06/1999  "right; left; left" . KNEE SURGERY   . MOHS SURGERY  ?2002  "tip of nose; basal cell" . NASAL POLYP EXCISION  2005  "benign" . PARS PLANA VITRECTOMY Bilateral 01/2007-05/2007 . SHOULDER ARTHROSCOPY Right 04/1998 . SHOULDER SURGERY   . VENTRAL HERNIA REPAIR  02/2006 HPI: Patient is an 83 yo male admitted 05/20/16 after a fall. MRI showed L1 compression fx. PMH: DM, COPD, esophagectomy (2007), Barrett esophagus, pna, HTN, CHF, CVA with Lt weakness, dementia, depression. CXR patchy parenchymal changes within the right lung particularly within the lower lobe. The changes in the right lower lobe a likely related to acute inflammatory change. Recent CT revealed Old RIGHT corona radiata/basal ganglia infarct and moderate to severe chronic small vessel ischemic disease. BSE 05/2015 recommended regular/thin, pt impulsive. No Data Recorded Assessment / Plan / Recommendation CHL IP CLINICAL IMPRESSIONS 05/24/2016 Clinical Impression Pt exhibited a mild oral dysphagia marked by prolonged mastication with solid and mild premature spill with thin. Mild motor impairments led to mild vallecular and pyriform sinsus residue which was cleared during the study. Of note, pt coughed frequently throughout the study however no barium was penetrated or aspirated during the assessment. He is at higher aspiration risk given esophagectomy. Recommend he continue Dys 3 diet/ thin liquids, stay up 1 hour after meals, straws allowed, and pills with thin as tolerated. Will follow up.    SLP Visit Diagnosis Dysphagia, oropharyngeal phase (R13.12) Attention and concentration deficit following -- Frontal lobe and executive function deficit following -- Impact on safety and function (No Data)   CHL IP TREATMENT RECOMMENDATION 05/24/2016 Treatment Recommendations Therapy as outlined in treatment plan below   Prognosis 05/24/2016 Prognosis for Safe Diet  Advancement Good Barriers to Reach Goals Cognitive deficits Barriers/Prognosis Comment -- CHL IP DIET RECOMMENDATION 05/24/2016 SLP Diet Recommendations Dysphagia 3 (Mech soft) solids;Thin liquid Liquid Administration via Cup;Straw Medication Administration Whole meds with liquid Compensations Slow rate;Small sips/bites Postural Changes Seated upright at 90 degrees;Remain semi-upright after after feeds/meals (Comment)   CHL IP OTHER RECOMMENDATIONS 05/24/2016 Recommended Consults -- Oral Care Recommendations Oral care BID Other Recommendations --   CHL IP FOLLOW UP RECOMMENDATIONS 05/24/2016 Follow up Recommendations None   CHL IP FREQUENCY AND DURATION 05/24/2016 Speech Therapy Frequency (ACUTE ONLY) min 2x/week Treatment Duration 2 weeks      CHL IP ORAL PHASE 05/24/2016 Oral Phase Impaired Oral - Pudding Teaspoon -- Oral - Pudding Cup -- Oral - Honey Teaspoon -- Oral - Honey Cup -- Oral - Nectar Teaspoon -- Oral - Nectar Cup -- Oral - Nectar Straw -- Oral - Thin Teaspoon -- Oral - Thin Cup Premature spillage Oral - Thin Straw -- Oral - Puree -- Oral - Mech Soft -- Oral - Regular Delayed oral transit Oral - Multi-Consistency -- Oral - Pill Delayed oral transit Oral Phase - Comment --  CHL  IP PHARYNGEAL PHASE 05/24/2016 Pharyngeal Phase Impaired Pharyngeal- Pudding Teaspoon -- Pharyngeal -- Pharyngeal- Pudding Cup -- Pharyngeal -- Pharyngeal- Honey Teaspoon -- Pharyngeal -- Pharyngeal- Honey Cup -- Pharyngeal -- Pharyngeal- Nectar Teaspoon -- Pharyngeal -- Pharyngeal- Nectar Cup -- Pharyngeal -- Pharyngeal- Nectar Straw -- Pharyngeal -- Pharyngeal- Thin Teaspoon -- Pharyngeal -- Pharyngeal- Thin Cup Pharyngeal residue - valleculae;Pharyngeal residue - pyriform;Reduced laryngeal elevation Pharyngeal -- Pharyngeal- Thin Straw -- Pharyngeal -- Pharyngeal- Puree -- Pharyngeal -- Pharyngeal- Mechanical Soft -- Pharyngeal -- Pharyngeal- Regular -- Pharyngeal -- Pharyngeal- Multi-consistency -- Pharyngeal -- Pharyngeal- Pill --  Pharyngeal -- Pharyngeal Comment --  CHL IP CERVICAL ESOPHAGEAL PHASE 05/24/2016 Cervical Esophageal Phase WFL Pudding Teaspoon -- Pudding Cup -- Honey Teaspoon -- Honey Cup -- Nectar Teaspoon -- Nectar Cup -- Nectar Straw -- Thin Teaspoon -- Thin Cup -- Thin Straw -- Puree -- Mechanical Soft -- Regular -- Multi-consistency -- Pill -- Cervical Esophageal Comment -- No flowsheet data found. [place] Siren 05/24/2016, 3:43 PM Orbie Pyo Colvin Caroli.Ed CCC-SLP Pager [phone]                 Subjective: Seen this morning. Spouse at bedside. As per spouse, mental status is at baseline. Patient oriented to self and no complaints reported. Did not report pain. As per chart review, has not used much pain medications and has only used occasional when necessary Tylenol for pain.  Discharge Exam:  Vitals:   05/26/16 0028 05/26/16 0506 05/26/16 0853 05/26/16 1514  BP:  (!) 144/66 (!) 148/68 (!) 126/51  Pulse:  (!) 56  75  Resp:  18  16  Temp:  97.8 F (36.6 C)  98.2 F (36.8 C)  TempSrc:  Oral  Oral  SpO2:  96%  97%  Weight: 71.2 kg (156 lb 14.4 oz)     Height:        General exam: Pleasant elderly male, lying comfortably supine in bed. Respiratory system: clear to auscultation. Respiratory effort normal. Cardiovascular system: S1 & S2 heard, RRR. No JVD, murmurs, rubs, gallops or clicks. No pedal edema. Not on telemetry. Gastrointestinal system: Abdomen is nondistended, soft and nontender. No organomegaly or masses felt. Normal bowel sounds heard. Large uncomplicated ventral hernia. Condom catheter +. Central nervous system: Mental status as above. Follow simple instructions. No focal neurological deficits. Extremities: Symmetric 5 x 5 power. Skin: No rashes, lesions or ulcers Psychiatry: Judgement and insight impaired. Mood & affect appropriate.     The results of significant diagnostics from this hospitalization (including imaging, microbiology, ancillary and laboratory) are listed below  for reference.     Microbiology: No results found for this or any previous visit (from the past 240 hour(s)).   Labs: CBC:  Recent Labs Lab 05/20/16 1846 05/21/16 0538 05/22/16 0915 05/23/16 0408 05/24/16 0331 05/25/16 0521  WBC 10.8* 7.5 13.9* 7.5 7.2 6.5  NEUTROABS 8.2*  --   --   --  4.2 3.7  HGB 12.3* 11.9* 12.4* 10.9* 10.9* 11.8*  HCT 37.3* 35.9* 37.9* 33.7* 33.7* 35.7*  MCV 84.6 84.9 85.6 85.8 85.8 86.4  PLT 210 171 176 161 171 937   Basic Metabolic Panel:  Recent Labs Lab 05/21/16 1400 05/22/16 0915 05/23/16 0408 05/24/16 0331 05/25/16 0521  NA 128* 128* 130* 131* 132*  K 4.6 4.2 4.2 4.2 4.1  CL 93* 94* 96* 96* 94*  CO2 24 23 27 26 28   GLUCOSE 124* 183* 113* 125* 130*  BUN 15 16 15 16 16   CREATININE 1.12  1.17 1.08 1.11 1.16  CALCIUM 9.6 9.5 9.1 9.2 9.6   Liver Function Tests:  Recent Labs Lab 05/20/16 1846 05/22/16 0915 05/23/16 0408  AST 24 15 14*  ALT 22 16* 15*  ALKPHOS 76 63 50  BILITOT 0.5 0.9 0.7  PROT 6.5 6.2* 5.4*  ALBUMIN 3.8 3.3* 2.8*   BNP (last 3 results)  Recent Labs  06/01/15 1844  BNP 174.9*   CBG:  Recent Labs Lab 05/25/16 1146 05/25/16 1731 05/25/16 2154 05/26/16 0759 05/26/16 1224  GLUCAP 157* 131* 136* 135* 221*   Urinalysis    Component Value Date/Time   COLORURINE YELLOW 05/20/2016 2001   APPEARANCEUR CLEAR 05/20/2016 2001   LABSPEC 1.011 05/20/2016 2001   PHURINE 5.0 05/20/2016 2001   GLUCOSEU NEGATIVE 05/20/2016 2001   HGBUR NEGATIVE 05/20/2016 2001   [place] NEGATIVE 05/20/2016 2001   BILIRUBINUR neg 08/27/2015 [place] 05/20/2016 2001   PROTEINUR NEGATIVE 05/20/2016 2001   UROBILINOGEN negative 08/27/2015 1626   UROBILINOGEN 1.0 07/25/2014 1440   NITRITE NEGATIVE 05/20/2016 2001   LEUKOCYTESUR NEGATIVE 05/20/2016 2001   Discussed in detail with patient's spouse. Updated care and answered questions.   Time coordinating discharge: Over 30  minutes  SIGNED:  Vernell Leep, MD, FACP, [place]. [facility] Pager [phone]  If 7PM-7AM, please contact night-coverage [email or web address] Password [email or web address] 05/26/2016, 4:21 PM

## 2016-05-26 NOTE — Care Management Important Message (Signed)
Important Message  Patient Details  Name: Chad [name] MRN: [id number] Date of Birth: 1929-02-15   Medicare Important Message Given:  Yes    Chad [name] [place] 05/26/2016, 11:15 AM

## 2016-05-26 NOTE — Consult Note (Signed)
   [facility] CM Inpatient Consult   05/26/2016  Chad [name] 1930-01-21 [id number]   Received a referral from inpatient RNCM, Levada Dy regarding the patient's potential change in disposition from a skilled facility to home with home health. Levada Dy states that the patient has used all of his skilled days and wife would have to pay out of pocket for skilled bed.  Wife, Chad [name], called and HIPAA was verified.  Wife states she was told the patient needed a procedure that is to be done on Monday because he had to be off of his Plavix and the procedure could be done on Monday.  She states it is taking at least two people to move him and she would be the only one at home to move him.  She states if it takes 2 people to move him.  She states the [place] won't take him until after the procedure and she felt he was also at risk at being home.   Wife states she had just gotten home. Wife is open to [facility] for post hospital follow up. Inpatient still working on disposition needs and trying to set up home health services and still with follow up with the [place] social worker. She spoke with the inpatient case manager and states that the [place] had approved [facility] for some care in the past for personal care services.   Ongoing assessment for [facility] needs.  Met with the wife at the bedside and explain how [facility] can assist with resources and post hospital follow up.  Wife state she is really frustrated and concerned for his disposition situation.  Explained that [facility] does not interfere with or replace any services that is needed or arranged. Eplained that the inpatient care management staff is still working on his disposition needs.  Wife signed consent for post hospital follow up and community care management needs.  Wife is interested in applying for Medicaid. Will follow up with inpatient RNCM of outcome of this meeting.  Natividad Brood, RN BSN [facility] Liaison  [phone] business mobile phone Toll free office [phone]

## 2016-05-26 NOTE — Discharge Instructions (Addendum)
Please get your medications reviewed and adjusted by your Primary MD. ° °Please request your Primary MD to go over all Hospital Tests and Procedure/Radiological results at the follow up, please get all Hospital records sent to your Prim MD by signing hospital release before you go home. ° °If you had Pneumonia of Lung problems at the Hospital: °Please get a 2 view Chest X ray done in 6-8 weeks after hospital discharge or sooner if instructed by your Primary MD. ° °If you have Congestive Heart Failure: °Please call your Cardiologist or Primary MD anytime you have any of the following symptoms:  °1) 3 pound weight gain in 24 hours or 5 pounds in 1 week  °2) shortness of breath, with or without a dry hacking cough  °3) swelling in the hands, feet or stomach  °4) if you have to sleep on extra pillows at night in order to breathe ° °Follow cardiac low salt diet and 1.5 lit/day fluid restriction. ° °If you have diabetes °Accuchecks 4 times/day, Once in AM empty stomach and then before each meal. °Log in all results and show them to your primary doctor at your next visit. °If any glucose reading is under 80 or above 300 call your primary MD immediately. ° °If you have Seizure/Convulsions/Epilepsy: °Please do not drive, operate heavy machinery, participate in activities at heights or participate in high speed sports until you have seen by Primary MD or a Neurologist and advised to do so again. ° °If you had Gastrointestinal Bleeding: °Please ask your Primary MD to check a complete blood count within one week of discharge or at your next visit. Your endoscopic/colonoscopic biopsies that are pending at the time of discharge, will also need to followed by your Primary MD. ° °Get Medicines reviewed and adjusted. °Please take all your medications with you for your next visit with your Primary MD ° °Please request your Primary MD to go over all hospital tests and procedure/radiological results at the follow up, please ask your  Primary MD to get all Hospital records sent to his/her office. ° °If you experience worsening of your admission symptoms, develop shortness of breath, life threatening emergency, suicidal or homicidal thoughts you must seek medical attention immediately by calling 911 or calling your MD immediately  if symptoms less severe. ° °You must read complete instructions/literature along with all the possible adverse reactions/side effects for all the Medicines you take and that have been prescribed to you. Take any new Medicines after you have completely understood and accpet all the possible adverse reactions/side effects.  ° °Do not drive or operate heavy machinery when taking Pain medications.  ° °Do not take more than prescribed Pain, Sleep and Anxiety Medications ° °Special Instructions: If you have smoked or chewed Tobacco  in the last 2 yrs please stop smoking, stop any regular Alcohol  and or any Recreational drug use. ° °Wear Seat belts while driving. ° °Please note °You were cared for by a hospitalist during your hospital stay. If you have any questions about your discharge medications or the care you received while you were in the hospital after you are discharged, you can call the unit and asked to speak with the hospitalist on call if the hospitalist that took care of you is not available. Once you are discharged, your primary care physician will handle any further medical issues. Please note that NO REFILLS for any discharge medications will be authorized once you are discharged, as it is imperative that you   return to your primary care physician (or establish a relationship with a primary care physician if you do not have one) for your aftercare needs so that they can reassess your need for medications and monitor your lab values.  You can reach the hospitalist office at phone [phone] or fax [phone]   If you do not have a primary care physician, you can call [phone] for a physician  referral.     Dysphagia Diet Level 3, Mechanically Advanced The dysphagia level 3 diet includes foods that are soft, moist, and can be chopped into 1-inch chunks. This diet is helpful for people with mild swallowing difficulties. It reduces the risk of food getting caught in the windpipe, trachea, or lungs. What do I need to know about this diet?  You may eat foods that are soft and moist.  If you were on the dysphagia level 1 or level 2 diets, you may eat any of the foods included on those lists.  Avoid foods that are dry, hard, sticky, chewy, coarse, and crunchy. Also avoid large cuts of food.  Take small bites. Each bite should contain 1 inch or less of food.  Thicken liquids if instructed by your health care provider. Follow your health care provider's instructions on how to do this and to what consistency.  See your dietitian or speech language pathologist regularly for help with your dietary changes. What foods can I eat? Grains  Moist breads without nuts or seeds. Biscuits, muffins, pancakes, and waffles well-moistened with syrup, jelly, margarine, or butter. Smooth cereals with plenty of milk to moisten them. Moist bread stuffing. Moist rice. Vegetables  All cooked, soft vegetables. Shredded lettuce. Tender fried potatoes. Fruits  All canned and cooked fruits. Soft, peeled fresh fruits, such as peaches, nectarines, kiwis, cantaloupe, honeydew melon, and watermelon without seeds. Soft berries, such as strawberries. Meat and Other Protein Sources  Moist ground or finely diced or sliced meats. Solid, tender cuts of meat. Meatloaf. Hamburger with a bun. Sausage patty. Deli thin-sliced lunch meat. Chicken, egg, or tuna salad sandwich. Sloppy joe. Moist fish. Eggs prepared any way. Casseroles with small chunks of meats, ground meats, or tender meats. Dairy  Cheese spreads without coarse large chunks. Shredded cheese. Cheese slices. Cottage cheese. Milk at the right texture. Smooth  frappes. Yogurt without nuts or coconut. Ask your health care provider whether you can have frozen desserts (such as malts or milk shakes) and thin liquids. Sweets/Desserts  Soft, smooth, moist desserts. Non-chewy, smooth candy. Jam. Jelly. Honey. Preserves. Ask your health care provider whether you can have frozen desserts. Fats and Oils  Butter. Oils. Margarine. Mayonnaise. Gravy. Spreads. Other  All seasonings and sweeteners. All sauces without large chunks. The items listed above may not be a complete list of recommended foods or beverages. Contact your dietitian for more options.  What foods are not recommended? Grains  Coarse or dry cereals. Dry breads. Toast. Crackers. Tough, crusty breads, such French bread and baguettes. Tough, crisp fried potatoes. Potato skins. Dry bread stuffing. Granola. Popcorn. Chips. Vegetables  All raw vegetables except shredded lettuce. Cooked corn. Rubbery or stiff cooked vegetables. Stringy vegetables, such as celery. Fruits  Hard fruits that are difficult to chew, such as apples or pears. Stringy, high-pulp fruits, such as pineapple, papaya, or mango. Fruits with tough skins, such as grapes. Coconut. All dried fruits. Fruit leather. Fruit roll-ups. Fruit snacks. Meat and Other Protein Sources  Dry or tough meats or poultry. Dry fish. Fish with bones. Peanut butter.  All nuts and seeds. Dairy  Any with nuts, seeds, chocolate chips, dried fruit, coconut, or pineapple. Sweets/Desserts  Dry cakes. Chewy or dry cookies. Any with nuts, seeds, dry fruits, coconut, pineapple, or anything dry, sticky, or hard. Chewy caramel. Licorice. Taffy-type candies. Ask your health care provider whether you can have frozen desserts. Fats and Oils  Any with chunks, nuts, seeds, or pineapple. Olives. Angie Fava. Other  Soups with tough or large chunks of meats, poultry, or vegetables. Corn or clam chowder. The items listed above may not be a complete list of foods and beverages to  avoid. Contact your dietitian for more information.  This information is not intended to replace advice given to you by your health care provider. Make sure you discuss any questions you have with your health care provider. Document Released: 01/10/2005 Document Revised: 06/18/2015 Document Reviewed: 12/24/2012 Elsevier Interactive Patient Education  2017 [employer].

## 2016-05-27 LAB — GLUCOSE, CAPILLARY
Glucose-Capillary: 137 mg/dL — ABNORMAL HIGH (ref 65–99)
Glucose-Capillary: 124 mg/dL — ABNORMAL HIGH (ref 65–99)

## 2016-05-27 LAB — PROCALCITONIN

## 2016-05-27 NOTE — Progress Notes (Signed)
Occupational Therapy Treatment Patient Details Name: Chad [name] MRN: [id number] DOB: March 23, 1929 Today's Date: 05/27/2016    History of present illness Patient is an 83 yo male admitted 05/20/16 after a fall.  MRI showed L1 compression fx.    PMH:  DM, COPD, HTN, CHF, CVA with Lt weakness, dementia, depression   OT comments  Pt. Awake upon arrival.  Difficult to engage and complete bed mobility.  Max assistance for initiation but unable to complete bed mobility to sit eob.  Continued to say "ouch" but could not get him to identify location.    Follow Up Recommendations  SNF;Supervision/Assistance - 24 hour    Equipment Recommendations       Recommendations for Other Services      Precautions / Restrictions Precautions Precautions: Fall Precaution Comments: History of falls at home       Mobility Bed Mobility Overal bed mobility: Needs Assistance Bed Mobility: Supine to Sit;Rolling Rolling: Max assist   Supine to sit: Max assist     General bed mobility comments: pt. required max tactile, verbal, and physical assistance.  able to bring B LEs off of bed but was unable to transition into sitting.  assisted back to bed and positioned with [facility] elevated  Transfers                      Balance                                           ADL either performed or assessed with clinical judgement   ADL Overall ADL's : Needs assistance/impaired                                             Vision       Perception     Praxis      Cognition Arousal/Alertness: Awake/alert Behavior During Therapy: Flat affect Overall Cognitive Status: History of cognitive impairments - at baseline                                          Exercises     Shoulder Instructions       General Comments      Pertinent Vitals/ Pain       Pain Assessment:  (kept saying "ouch, ouch, ouch" did not speak any other words)  Home  Living                                          Prior Functioning/Environment              Frequency  Min 2X/week        Progress Toward Goals  OT Goals(current goals can now be found in the care plan section)        Plan      Co-evaluation                 AM-PAC PT "6 Clicks" Daily Activity     Outcome Measure   Help from another person eating meals?: Total Help from  another person taking care of personal grooming?: Total Help from another person toileting, which includes using toliet, bedpan, or urinal?: Total Help from another person bathing (including washing, rinsing, drying)?: Total Help from another person to put on and taking off regular upper body clothing?: Total Help from another person to put on and taking off regular lower body clothing?: Total 6 Click Score: 6    End of Session    OT Visit Diagnosis: Muscle weakness (generalized) (M62.81);Cognitive communication deficit (R41.841);Pain;Hemiplegia and hemiparesis Hemiplegia - Right/Left: Left Hemiplegia - dominant/non-dominant: Non-Dominant Hemiplegia - caused by: Cerebral infarction   Activity Tolerance Patient limited by pain   Patient Left in bed;with call bell/phone within reach;with bed alarm set   Nurse Communication          Time: 3300-7622 OT Time Calculation (min): 13 min  Charges: OT General Charges $OT Visit: 1 Procedure OT Treatments $Self Care/Home Management : 8-22 mins   Janice Coffin, COTA/L 05/27/2016, 9:40 AM

## 2016-05-27 NOTE — Progress Notes (Signed)
Progress note  Patient was discharged and was supposed to leave on 05/26/16 but did not go due to transport related issues.  Patient interviewed and examined this morning. Oriented only to self. Follows simple instructions. Specifically denies pain. No other complaints reported. Physical exam: Stable vital signs and unchanged/stable system exams as yesterday.  Discussed in detail with patient's spouse at bedside. She was concerned that IR had informed her earlier during the course of admission that patient should be able to lie on his belly for the outpatient procedure and that they were going to evaluate him for this. Discussed with IR and their follow-up note appreciated >outpatient L1 VP/KP scheduled pending insurance approval, patient noted to be acutely tender at L1 area on palpation and spouse's question regarding laying on the abdomen for procedure was addressed with her by IR,  Discussed in detail with case management and clinical social worker who have made all arrangements for home health services at discharge home.  Vernell Leep, MD, FACP, FHM. [facility] Pager [phone]  If 7PM-7AM, please contact night-coverage [email or web address] Password [email or web address] 05/27/2016, 1:13 PM

## 2016-05-27 NOTE — Progress Notes (Addendum)
Physical Therapy Treatment Patient Details Name: Chad [name] MRN: [id number] DOB: 04/17/1929 Today's Date: 05/27/2016    History of Present Illness Patient is an 83 yo male admitted 05/20/16 after a fall.  MRI showed L1 compression fx.    PMH:  DM, COPD, HTN, CHF, CVA with Lt weakness, dementia, depression    PT Comments    Max assist of 2 people for rolling and for sidelying to sit. Pt has heavy posterior lean in sitting and standing requiring mod to max assist. Pt ambulated 100' with mod assist of 2 people. He is a high fall risk. Wife reports he was declined a SNF bed until he has kyphoplasty.  She doesn't have other help at home. 24* home health aide recommended for assistance with mobility and for safety due to high fall risk.    Follow Up Recommendations  SNF;Supervision/Assistance - 24 hour (+2 assist for mobility due to high fall risk)     Equipment Recommendations  None recommended by PT    Recommendations for Other Services       Precautions / Restrictions Precautions Precautions: Fall Precaution Comments: History of falls at home Restrictions Weight Bearing Restrictions: No    Mobility  Bed Mobility Overal bed mobility: Needs Assistance Bed Mobility: Rolling;Sidelying to Sit Rolling: Max assist Sidelying to sit: Max assist       General bed mobility comments: rolled L x R x 3 each for pericare and for log R to R to get OOB; max assist, pt 30% for rolling. Max A to raise trunk. Pt with heavy posterior lean in sitting requiring max A.   Transfers Overall transfer level: Needs assistance Equipment used: Rolling walker (2 wheeled) Transfers: Sit to/from Stand Sit to Stand: Max assist         General transfer comment: assist to rise and steady, posterior lean standing with RW initially requiring mod A, then min/guard  Ambulation/Gait Ambulation/Gait assistance: Mod assist;+2 safety/equipment Ambulation Distance (Feet): 100 Feet Assistive device:  Rolling walker (2 wheeled) Gait Pattern/deviations: Step-to pattern;Decreased step length - right;Decreased step length - left;Trunk flexed Gait velocity: at times too fast Gait velocity interpretation: at or above normal speed for age/gender General Gait Details: VCs to lift head and increase step length, and for positioning in RW (little response from pt), LOB x 2 requiring mod A, mod A to maneuver RW going straight and with turns   Financial trader Rankin (Stroke Patients Only)       Balance Overall balance assessment: Needs assistance Sitting-balance support: Bilateral upper extremity supported;Feet supported Sitting balance-Leahy Scale: Poor Sitting balance - Comments: mod to max A for posterior lean Postural control: Posterior lean Standing balance support: Bilateral upper extremity supported Standing balance-Leahy Scale: Poor Standing balance comment: moderate use of the RW, posterior lean initially                            Cognition Arousal/Alertness: Awake/alert Behavior During Therapy: Flat affect Overall Cognitive Status: History of cognitive impairments - at baseline                                        Exercises      General Comments        Pertinent Vitals/Pain Faces Pain Scale: Hurts  even more Pain Location: back, with activity Pain Descriptors / Indicators: Grimacing Pain Intervention(s): Limited activity within patient's tolerance;Monitored during session    Home Living                      Prior Function            PT Goals (current goals can now be found in the care plan section) Acute Rehab PT Goals Patient Stated Goal: wife wants pt to go to SNF, doesn't feel she can manage him at home, she reports SNF declined him, she's waiting to hear if [facility] will provide [facility] assistance PT Goal Formulation: With family Time For Goal Achievement: 05/29/16 Potential to Achieve  Goals: Fair Progress towards PT goals: Progressing toward goals    Frequency    Min 3X/week      PT Plan Current plan remains appropriate    Co-evaluation              AM-PAC PT "6 Clicks" Daily Activity  Outcome Measure  Difficulty turning over in bed (including adjusting bedclothes, sheets and blankets)?: Total Difficulty moving from lying on back to sitting on the side of the bed? : Total Difficulty sitting down on and standing up from a chair with arms (e.g., wheelchair, bedside commode, etc,.)?: Total Help needed moving to and from a bed to chair (including a wheelchair)?: A Lot Help needed walking in hospital room?: A Lot Help needed climbing 3-5 steps with a railing? : A Lot 6 Click Score: 9    End of Session Equipment Utilized During Treatment: Gait belt Activity Tolerance: Patient tolerated treatment well Patient left: in chair;with call bell/phone within reach;with family/visitor present Nurse Communication: Mobility status PT Visit Diagnosis: Other abnormalities of gait and mobility (R26.89);History of falling (Z91.81);Muscle weakness (generalized) (M62.81);Pain     Time: 1426-1500 PT Time Calculation (min) (ACUTE ONLY): 34 min  Charges:  $Gait Training: 8-22 mins $Therapeutic Activity: 8-22 mins                    G Codes:          Philomena Doheny 05/27/2016, 3:22 PM [phone]

## 2016-05-27 NOTE — Progress Notes (Signed)
CM spoke with Margarita Sermons CSW/ [facility] regarding pt's home health needs. CM received fax # for pt's PCP (Dr. Sherral Hammers). H&P, home health orders, demographics,PT/OT evaluations, d/c summary faxed to PCP @ [phone]. Santa Lighter stated she will f/u with pt/wife regarding arranged home health services. CM made pt/wife aware. Whitman Hero RN,BSN,CM

## 2016-05-27 NOTE — Progress Notes (Signed)
Pt and Pt's wife given discharge instructions, prescriptions, and care notes. Pt's wife verbalized understanding AEB no further questions or concerns at this time. IV was discontinued, no redness, pain, or swelling noted at this time. Pt left the floor via PTAR in stable condition to go home with wife.

## 2016-05-27 NOTE — Progress Notes (Signed)
Patient ID: Chad [name], male   DOB: 10-12-1929, 83 y.o.   MRN: [id number]  Pt with acute Lumbar 1 painful fracture  L1 VP/KP has been approved with Dr Estanislado Pandy Insurance for outpatient procedure still pending  Last dose Plavix 4/30. Can safely perform this procedure no earlier than 5/7 Monday.  Being discharged now to SNF per MD  I have re examined pt and find him to be acutely tender at L1 area. Cries out in pain when moving to even be examined.   IR scheduler has wifes phone number --- we will call her with time and date of procedure. Plan to continue off Plavix until then. Hope for [employer] approval in next few days.  Of note: pt does have large abdominal hernia. Does not normally lay on belly. I have palpated abd firmly without pain. Procedure is performed with sedation--- I believe he will be able to tolerate procedure for approx 1 hr Wife knows if he cannot tolerate--we will stop.

## 2016-05-30 ENCOUNTER — Telehealth: Payer: Self-pay | Admitting: Internal Medicine

## 2016-05-30 ENCOUNTER — Other Ambulatory Visit: Payer: Self-pay | Admitting: *Deleted

## 2016-05-30 ENCOUNTER — Encounter: Payer: Self-pay | Admitting: *Deleted

## 2016-05-30 ENCOUNTER — Other Ambulatory Visit: Payer: Self-pay

## 2016-05-30 DIAGNOSIS — J441 Chronic obstructive pulmonary disease with (acute) exacerbation: Secondary | ICD-10-CM

## 2016-05-30 NOTE — Patient Outreach (Signed)
[facility]) Care Management  05/30/2016  LUCIANO CINQUEMANI [birth year] [id number]   Referral received from hospital liaison to initiate transition of care program.  Member was recently hospitalized 4/27-5/4 for hyponatremia.  Per chart, he also has history of dementia, hypertension, carotid stenosis, heart failure, COPD, diabetes, and stroke.  Call placed to wife, member's identity verified.  This care manager introduced self and purpose of call.  She report that over the weekend she has decided to place member under the care of hospice.  She state that she has discussed with member's primary MD and social worker with the [place] system, and has contacted [facility] for a referral/assessment.  She is aware that [facility] will close case due to involvement with other care management program, but she does have contact information for this care manager for future reference.  Will notify care management assistant to place member as inactive.  Will not open case at this time.  Valente David, [place], MSN [place] [phone]

## 2016-05-30 NOTE — Telephone Encounter (Signed)
[facility]ad [name] from [facility] called wanting to see if Dr Sharlet Salina would be the attending of record for the pt and also to see if she wants standing orders for hospice. Please advise.

## 2016-05-30 NOTE — Telephone Encounter (Signed)
Fine to be attending.

## 2016-05-31 ENCOUNTER — Other Ambulatory Visit: Payer: Self-pay | Admitting: *Deleted

## 2016-05-31 NOTE — Telephone Encounter (Signed)
Katharine Look contacted and aware

## 2016-05-31 NOTE — Patient Outreach (Signed)
[facility]) Care Management  05/31/2016  [name] [birth year] [id number]   CSW will perform a case closure on patient, as patient has elected to receive services through [facility].  Case Closure letter has been sent to patient's Primary Care Physician, Dr. Pricilla Holm by patient's RNCM, Valente David, also with [facility].  Mammie Russian, Care Management Assistant with [facility], will perform a case closure.   Nat Christen, BSW, MSW, LCSW  Licensed Education officer, environmental Health System  Mailing [place] [address] [place], [place] [address] Physical Address-[address], [place], [place] [address] Toll Free Main # [phone] Fax # [phone] Cell # [phone]  Office # [phone] [email or web address]

## 2016-05-31 NOTE — Patient Outreach (Signed)
[facility]) Care Management  05/31/2016  EULOGIO REQUENA 1929-12-24 [id number]   CSW made an initial attempt to try and contact patient and patient's wife, Jakyle Petrucelli today to perform phone assessment, as well as assess and assist with social needs and services, without success.  A HIPAA compliant message was left for patient and Mrs. Mccraw on voicemail.  CSW is currently awaiting a return call. CSW will make a second outreach attempt within the next week, if CSW does not receive a return call from patient and/or Mrs. Goeller in the meantime. Nat Christen, BSW, MSW, LCSW  Licensed Education officer, environmental Health System  Mailing [place] [address] [place], [place] [address] Physical Address-[address], [place], [place] [address] Toll Free Main # [phone] Fax # [phone] Cell # [phone]  Office # [phone] [email or web address]

## 2016-06-07 ENCOUNTER — Ambulatory Visit: Payer: Self-pay | Admitting: *Deleted

## 2016-06-22 ENCOUNTER — Other Ambulatory Visit: Payer: Self-pay | Admitting: Internal Medicine

## 2016-06-22 ENCOUNTER — Other Ambulatory Visit: Payer: Self-pay | Admitting: Neurology

## 2016-07-08 ENCOUNTER — Other Ambulatory Visit: Payer: Self-pay | Admitting: *Deleted

## 2016-07-08 NOTE — Patient Outreach (Signed)
[facility] Screening call. Call was not made after further chart review which revealed recent [facility] involvment and the discovery that pt is now receiving Hospice services.  [name]. [name], MSN, [facility] Gerontological Nurse Practitioner [facility] Care Management [phone]

## 2016-07-11 ENCOUNTER — Telehealth: Payer: Self-pay | Admitting: Internal Medicine

## 2016-07-11 MED ORDER — MORPHINE SULFATE 20 MG/5ML PO SOLN: 4.0000 mg | ORAL | 0 refills | Status: AC | PRN

## 2016-07-11 NOTE — Telephone Encounter (Signed)
Routing to greg, please advise, I will call hospice back, thanks

## 2016-07-11 NOTE — Telephone Encounter (Signed)
Prescription written to be faxed.  

## 2016-07-11 NOTE — Telephone Encounter (Signed)
Pt has not been taking any oral food or fluid, is having signs and symptoms of pain with little manipulation , calls out in pain when they turn him  Wants Rx for morphine solution concerntrate 20mg  per 17mL  5mg  every 4 hours as needs for pain  76ml bottle   Can fax opioids to pharmacy if put on Rx (FOR HOSPICE PT) [facility] [address]    Pt is transitioning to end of life and they are trying make him comfortable

## 2016-07-12 NOTE — Telephone Encounter (Signed)
Faxed to [facility]

## 2016-07-21 ENCOUNTER — Other Ambulatory Visit: Payer: Self-pay | Admitting: Internal Medicine

## 2016-07-24 ENCOUNTER — Other Ambulatory Visit: Payer: Self-pay | Admitting: Internal Medicine

## 2016-08-04 ENCOUNTER — Telehealth: Payer: Self-pay | Admitting: Internal Medicine

## 2016-08-05 ENCOUNTER — Telehealth: Payer: Self-pay

## 2016-08-05 NOTE — Telephone Encounter (Signed)
On 08/05/16 I received a death certificate from [place]. (original) The death certificate is for burial. The patient is a patient of Doctor Crawford. The death certificate will be taken to Primary Care @ [facility] Monday am for signature.  On Aug 21, 2016 I received the death certificate back from Doctor Nche who signed the death certificate since Doctor Sharlet Salina is on maternity leave. I got the death certificate ready and called the funeral home to let them know the death certificate is ready for pickup.

## 2016-08-24 NOTE — Telephone Encounter (Signed)
Just FYI for now.

## 2016-08-24 NOTE — Telephone Encounter (Signed)
Patient expired on 08-13-2016 at 6:26am.

## 2016-08-24 NOTE — Telephone Encounter (Signed)
Noted  

## 2016-08-24 DEATH — deceased

## 2016-09-13 ENCOUNTER — Encounter: Payer: PPO | Admitting: Internal Medicine

## 2016-10-19 ENCOUNTER — Encounter: Payer: PPO | Admitting: Internal Medicine

## 2017-02-27 ENCOUNTER — Ambulatory Visit: Payer: PPO | Admitting: Neurology

## 2017-05-10 IMAGING — CR DG CHEST 2V
2 series · 2 of 2 positions shown · non-contrast
Comparison: PA and lateral chest of December 22, 2013, abdominal
and pelvic CT scan May 31, 2010.

CLINICAL DATA: Five days of wheezing and shortness of breath ;
history of COPD, diabetes, and previous tobacco use.

EXAM:
CHEST  2 VIEW

[view not recorded (1 of 2)]
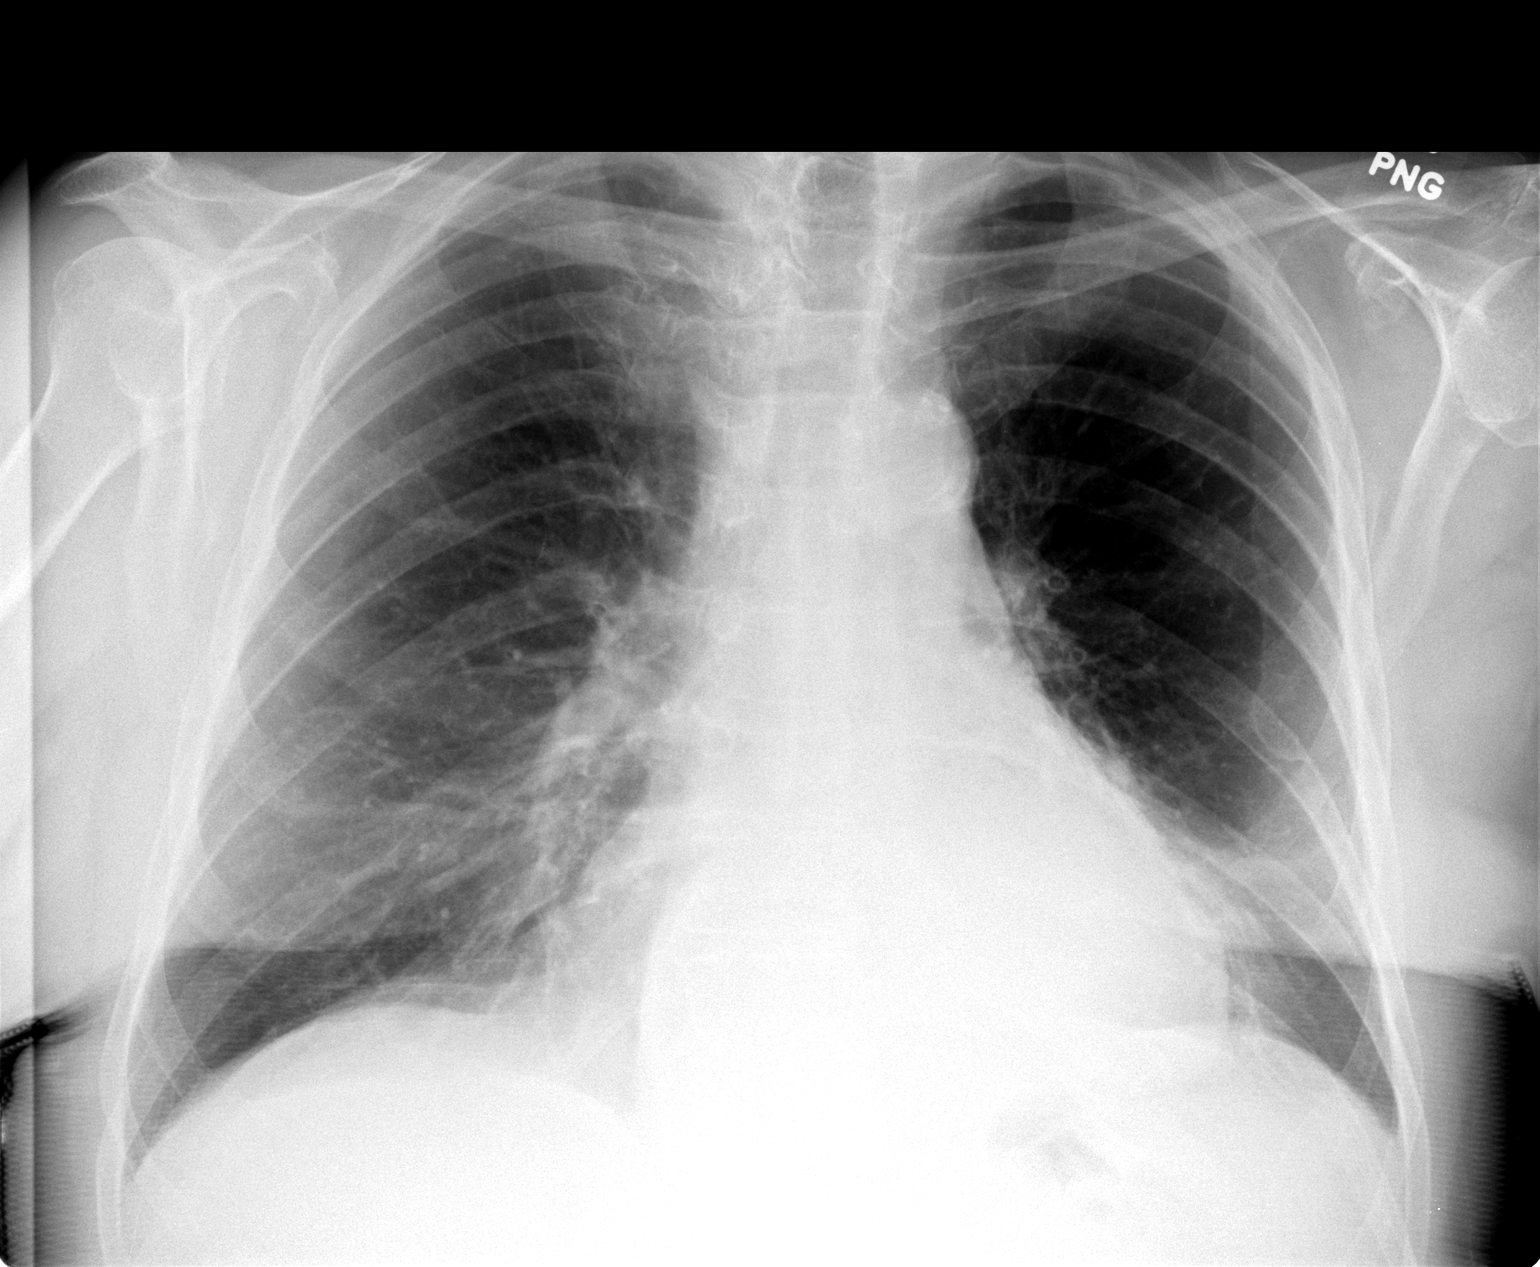

[view not recorded (2 of 2)]
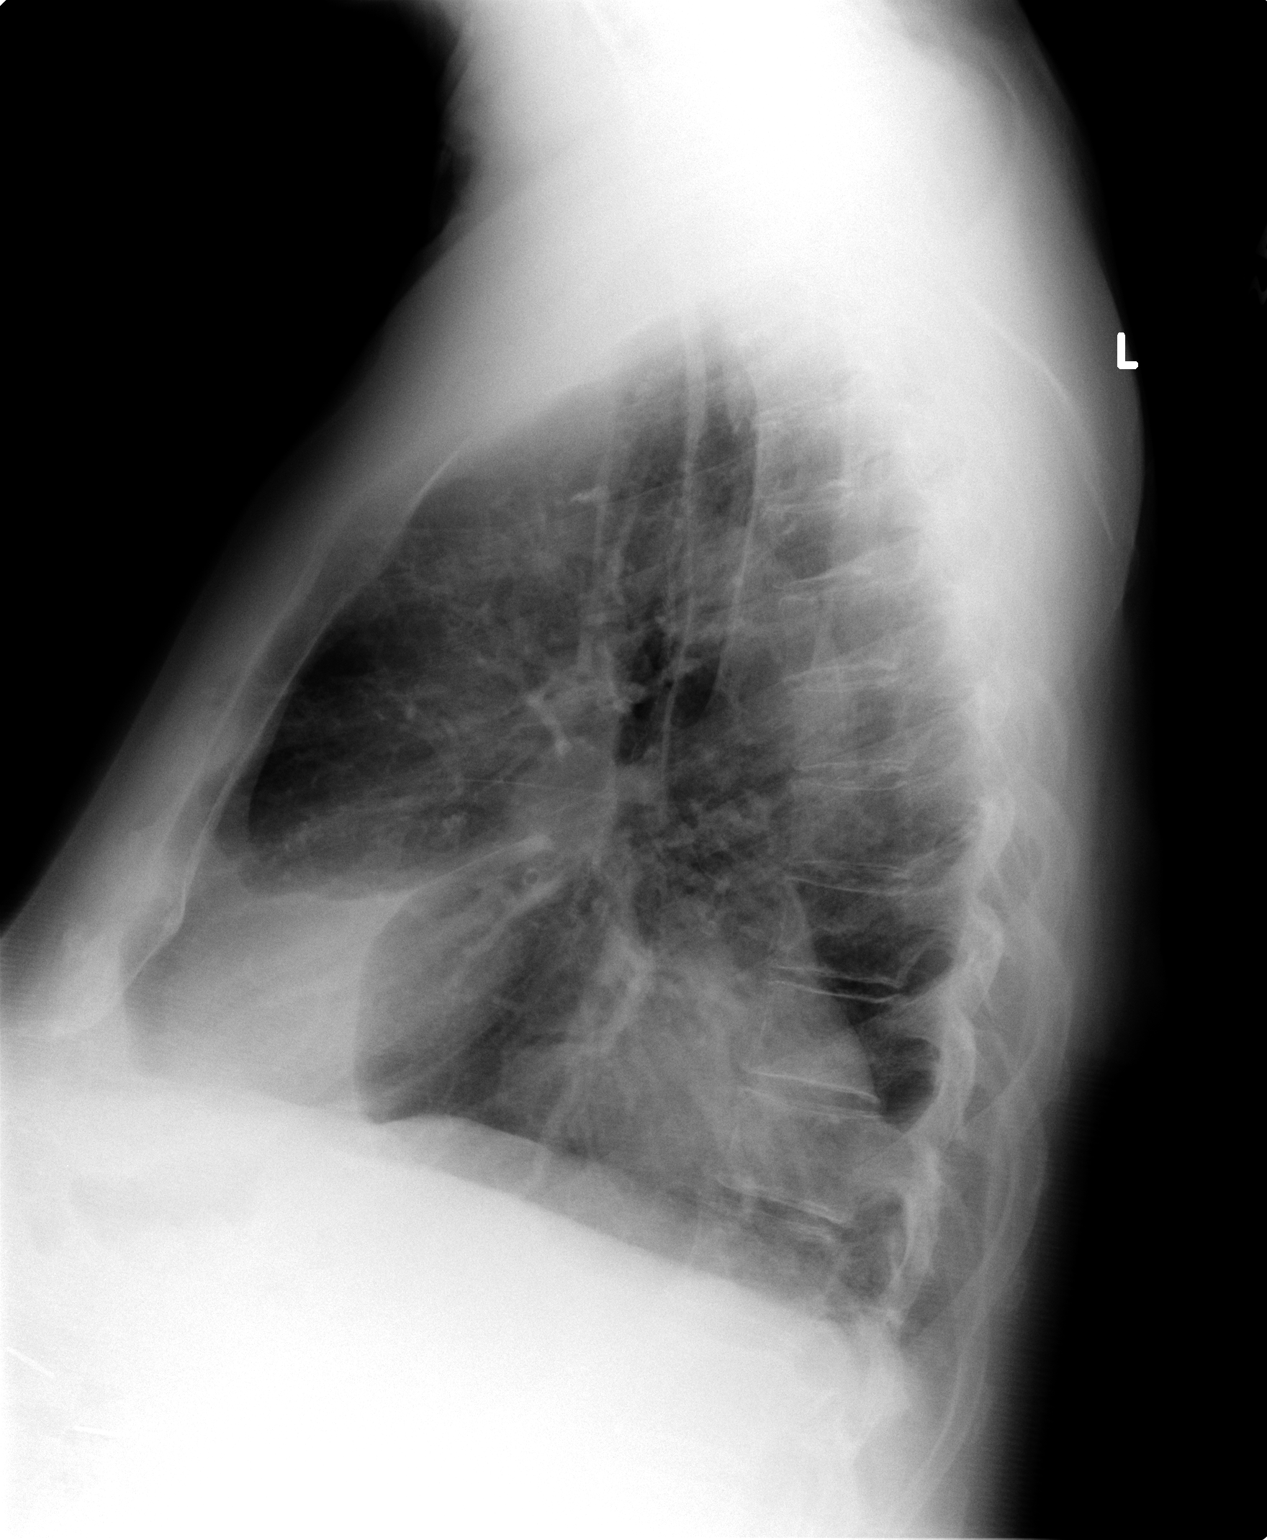

[2 of 2 positions shown; findings below may reference images not displayed]

FINDINGS: The lungs are mildly hyperinflated with hemidiaphragm flattening.
There are increased infrahilar lung markings bilaterally which are
not new. The cardiac silhouette is enlarged but stable. The
pulmonary vascularity is not engorged. The mediastinum is normal in
width. There is a large hiatal hernia-partially intrathoracic
stomach. There is no pleural effusion. The bony thorax is
unremarkable.
IMPRESSION: COPD, bibasilar subsegmental atelectasis versus scarring. There is
no pneumonia nor CHF.

## 2018-02-19 IMAGING — DX DG CHEST 2V
3 series · 3 of 3 positions shown · non-contrast
Comparison: 03/11/2015 abdominal CT 05/31/2010

CLINICAL DATA: 85-year-old male with a history of follow-up
pneumonia. Coughing.

EXAM:
CHEST - 2 VIEW

[chest pa (1 of 2)]
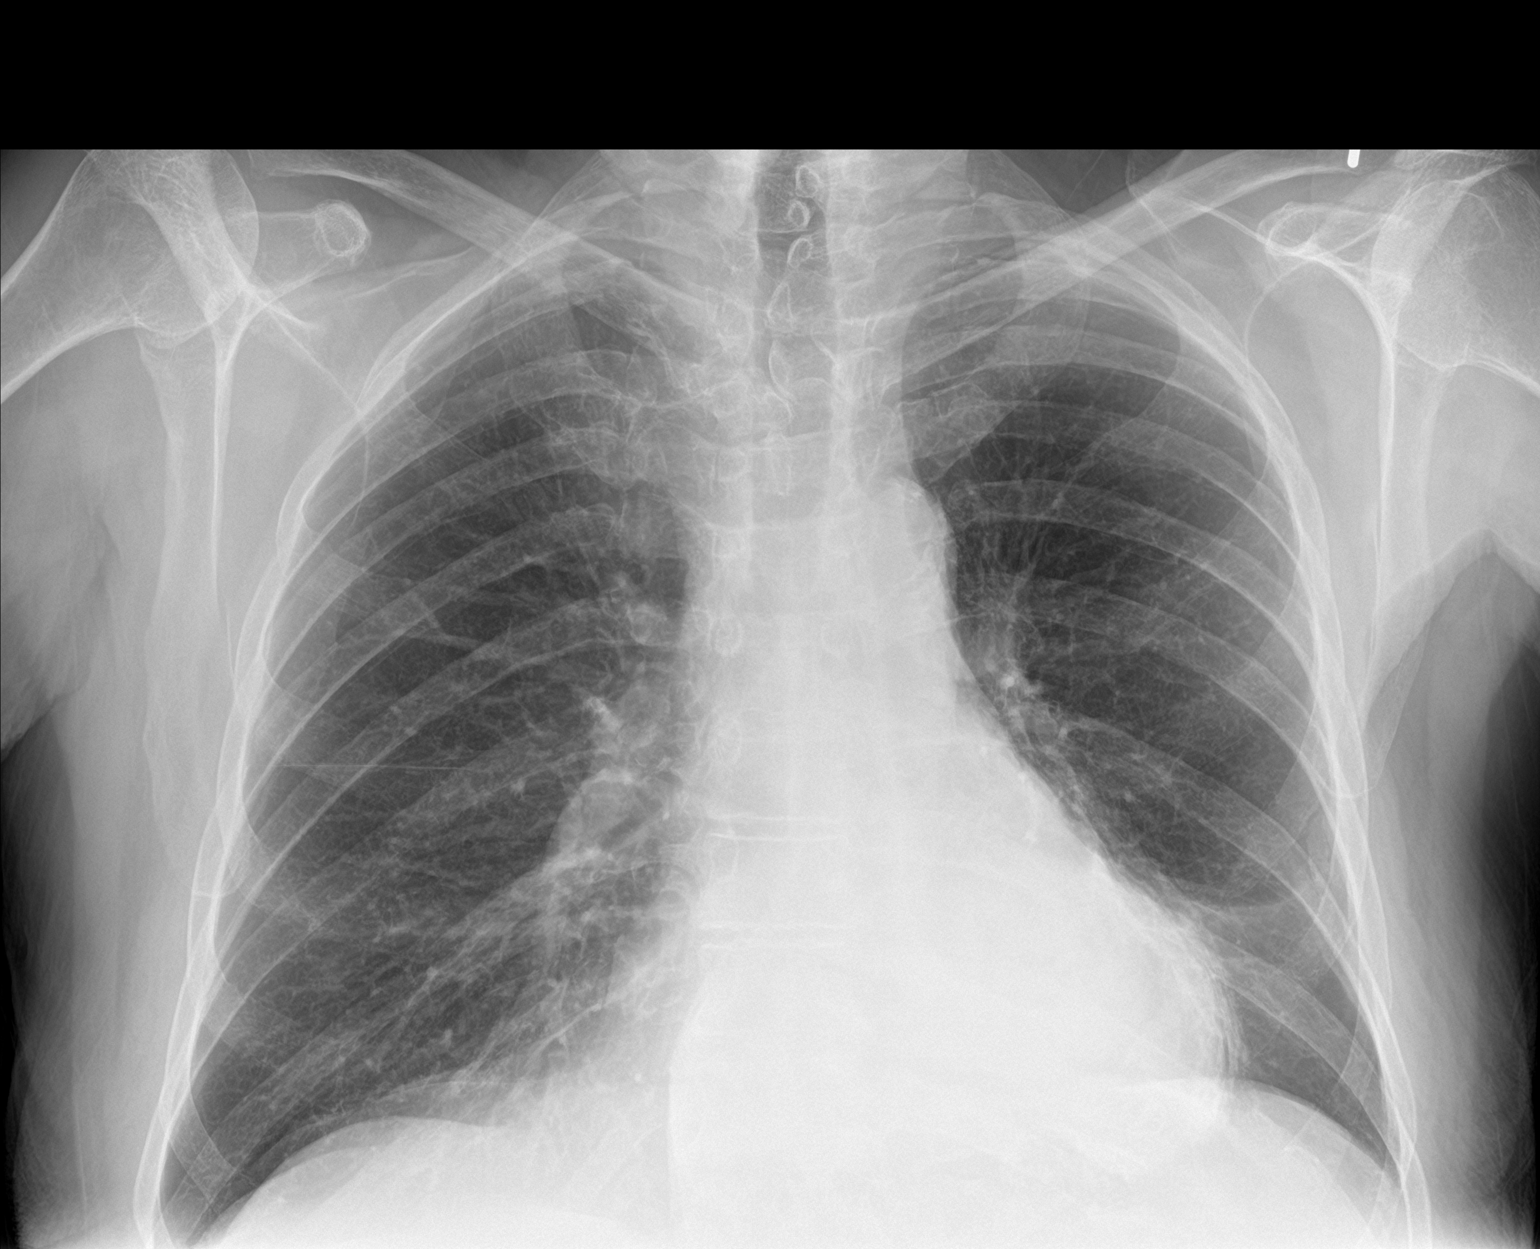

[chest lat]
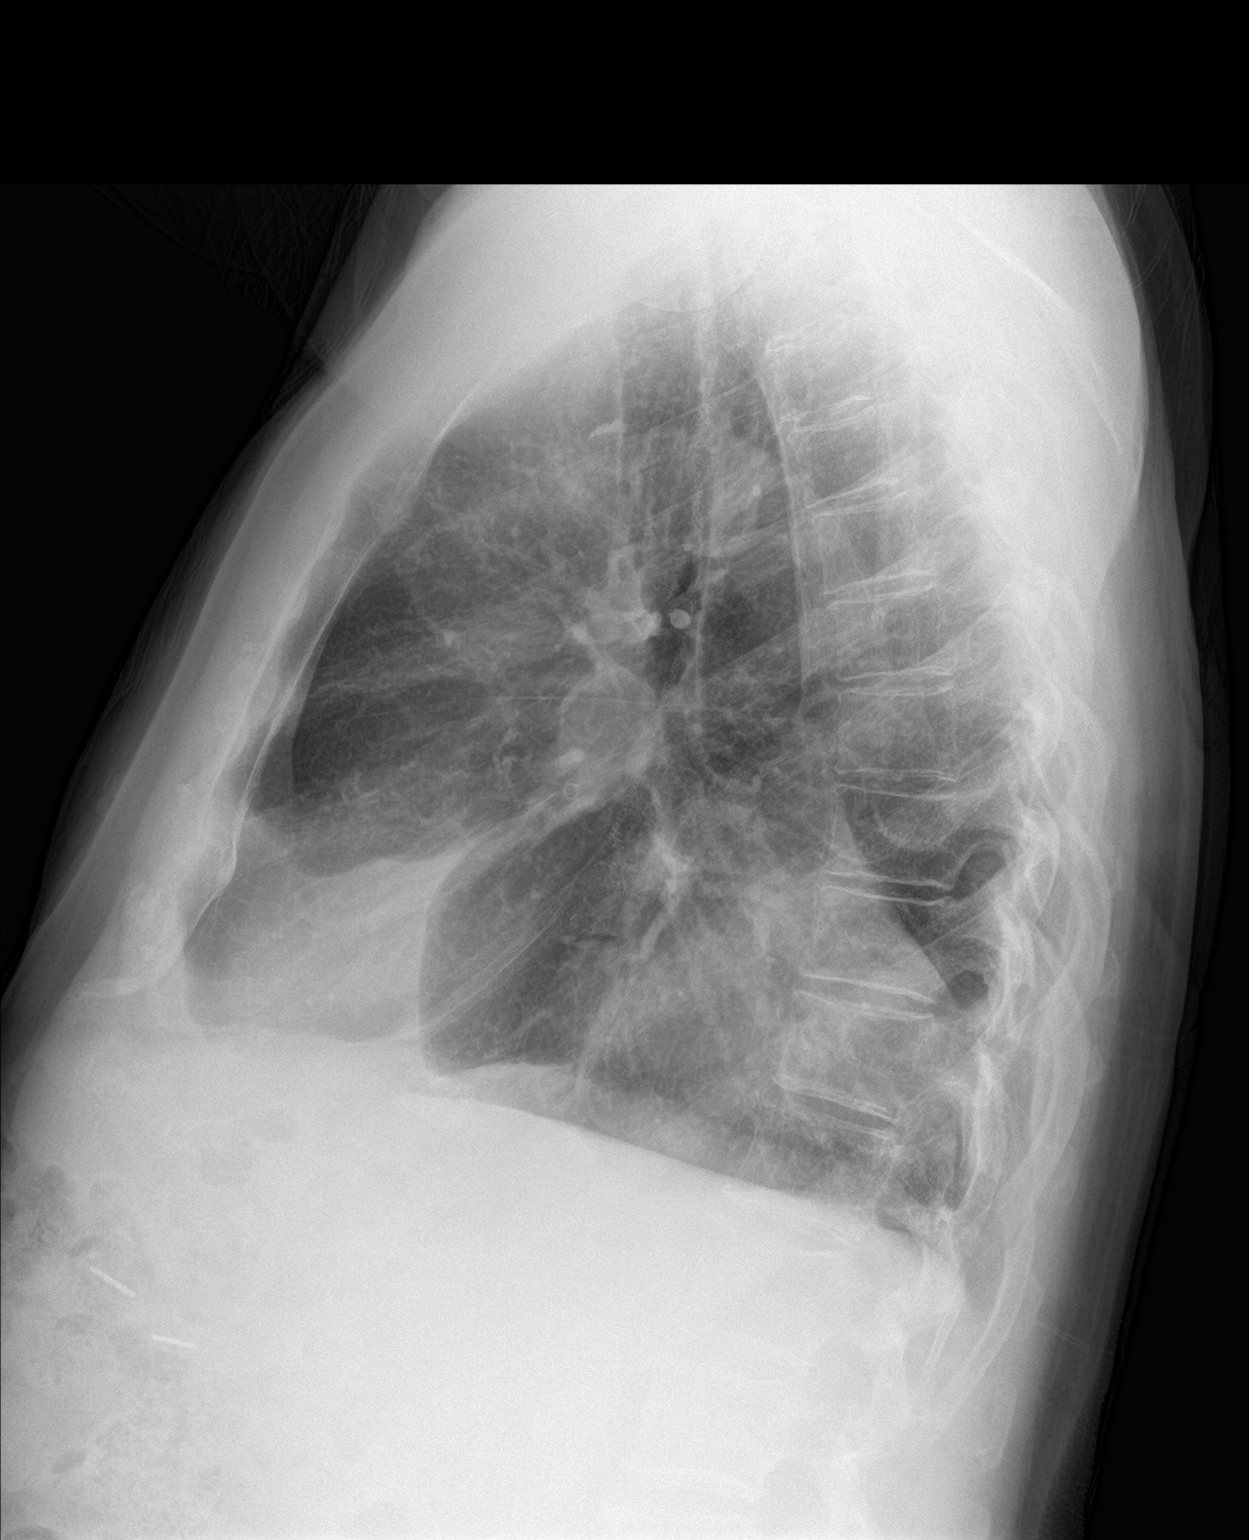

[chest pa (2 of 2)]
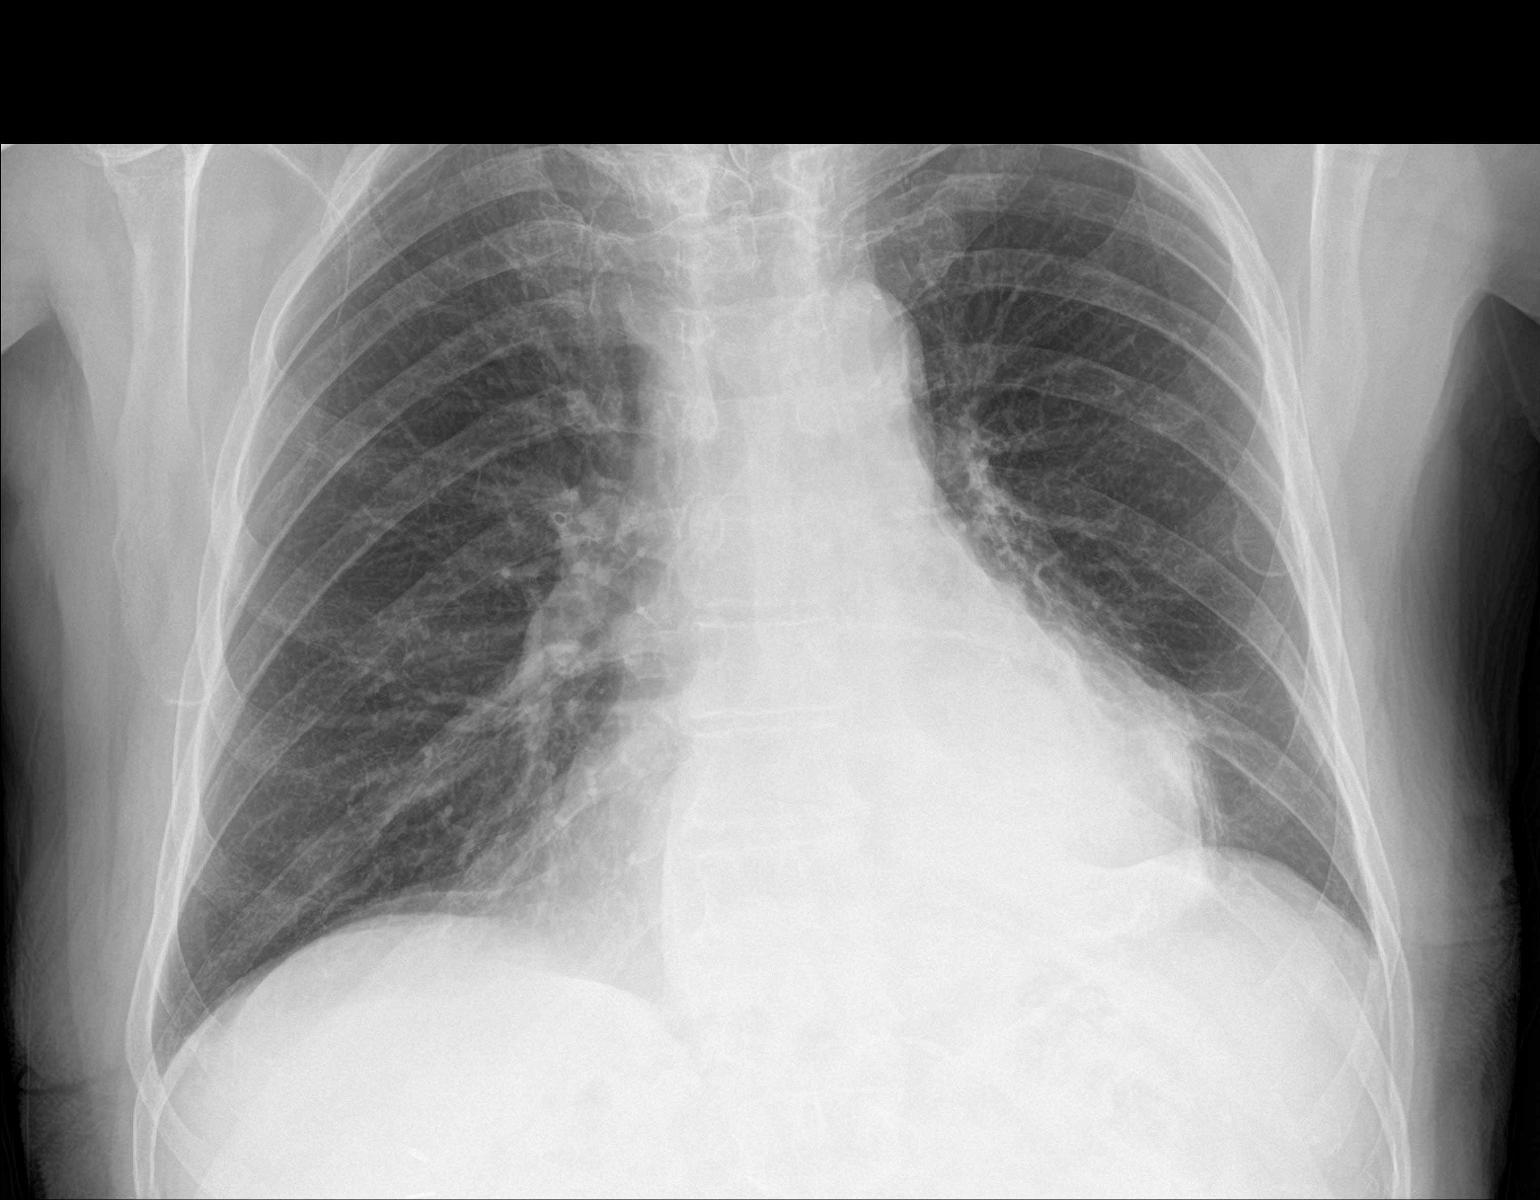

[3 of 3 positions shown; findings below may reference images not displayed]

FINDINGS: Cardiomediastinal silhouette similar in size and contour to the
comparison. Atherosclerotic calcification of the aortic arch.

No evidence of pulmonary vascular congestion.

Improved aeration of the left lung.

Stigmata of emphysema, with increased retrosternal airspace,
flattened hemidiaphragms, increased AP diameter, and hyperinflation
on the AP view.

Wedge-shaped opacity at the posterior base on the lateral view,
present on the most recent chest x-ray comparison studies.

No pneumothorax.

No confluent airspace disease.

No displaced fracture.

Unremarkable appearance of the upper abdomen.
IMPRESSION: Resolving left upper lobe pneumonia.

Evidence of large hiatal hernia, which was present on the prior CT
of the abdomen 05/31/2010.

Atherosclerosis.
